# Patient Record
Sex: Female | Born: 1955
Health system: Southern US, Community
[De-identification: ages and names within clinical notes are randomized; demographics above are authoritative.]

## PROBLEM LIST (undated history)

## (undated) DIAGNOSIS — G8929 Other chronic pain: Secondary | ICD-10-CM

## (undated) DIAGNOSIS — R112 Nausea with vomiting, unspecified: Secondary | ICD-10-CM

## (undated) DIAGNOSIS — G609 Hereditary and idiopathic neuropathy, unspecified: Secondary | ICD-10-CM

## (undated) DIAGNOSIS — R1013 Epigastric pain: Secondary | ICD-10-CM

## (undated) DIAGNOSIS — G2581 Restless legs syndrome: Secondary | ICD-10-CM

## (undated) DIAGNOSIS — Z6826 Body mass index (BMI) 26.0-26.9, adult: Secondary | ICD-10-CM

## (undated) DIAGNOSIS — I10 Essential (primary) hypertension: Secondary | ICD-10-CM

## (undated) DIAGNOSIS — M549 Dorsalgia, unspecified: Secondary | ICD-10-CM

## (undated) DIAGNOSIS — R200 Anesthesia of skin: Secondary | ICD-10-CM

## (undated) DIAGNOSIS — K449 Diaphragmatic hernia without obstruction or gangrene: Secondary | ICD-10-CM

## (undated) DIAGNOSIS — R079 Chest pain, unspecified: Secondary | ICD-10-CM

## (undated) DIAGNOSIS — E7800 Pure hypercholesterolemia, unspecified: Secondary | ICD-10-CM

## (undated) DIAGNOSIS — E785 Hyperlipidemia, unspecified: Secondary | ICD-10-CM

## (undated) HISTORY — DX: Essential (primary) hypertension: I10

## (undated) HISTORY — PX: NECK SURGERY: SHX720

## (undated) HISTORY — DX: Hyperlipidemia, unspecified: E78.5

## (undated) HISTORY — DX: Chest pain, unspecified: R07.9

## (undated) HISTORY — DX: Anesthesia of skin: R20.0

## (undated) HISTORY — DX: Body mass index (BMI) 26.0-26.9, adult: Z68.26

## (undated) HISTORY — DX: Epigastric pain: R10.13

## (undated) HISTORY — DX: Hereditary and idiopathic neuropathy, unspecified: G60.9

## (undated) HISTORY — DX: Restless legs syndrome: G25.81

## (undated) HISTORY — PX: PARTIAL HYSTERECTOMY: SHX80

## (undated) HISTORY — PX: TOE SURGERY: SHX1073

---

## 2011-03-06 ENCOUNTER — Emergency Department (HOSPITAL_COMMUNITY)
Admission: EM | Admit: 2011-03-06 | Discharge: 2011-03-06 | Disposition: A | Discharge status: Home / Self Care | Payer: Self-pay | Attending: Emergency Medicine | Admitting: Emergency Medicine

## 2011-03-06 ENCOUNTER — Other Ambulatory Visit: Payer: Self-pay

## 2011-03-06 ENCOUNTER — Encounter: Payer: Self-pay | Admitting: Emergency Medicine

## 2011-03-06 DIAGNOSIS — K449 Diaphragmatic hernia without obstruction or gangrene: Secondary | ICD-10-CM | POA: Insufficient documentation

## 2011-03-06 DIAGNOSIS — E876 Hypokalemia: Secondary | ICD-10-CM | POA: Insufficient documentation

## 2011-03-06 DIAGNOSIS — R9431 Abnormal electrocardiogram [ECG] [EKG]: Secondary | ICD-10-CM | POA: Insufficient documentation

## 2011-03-06 DIAGNOSIS — E785 Hyperlipidemia, unspecified: Secondary | ICD-10-CM | POA: Insufficient documentation

## 2011-03-06 DIAGNOSIS — M549 Dorsalgia, unspecified: Secondary | ICD-10-CM | POA: Insufficient documentation

## 2011-03-06 DIAGNOSIS — R1013 Epigastric pain: Secondary | ICD-10-CM | POA: Insufficient documentation

## 2011-03-06 DIAGNOSIS — R112 Nausea with vomiting, unspecified: Secondary | ICD-10-CM | POA: Insufficient documentation

## 2011-03-06 DIAGNOSIS — E7800 Pure hypercholesterolemia, unspecified: Secondary | ICD-10-CM | POA: Insufficient documentation

## 2011-03-06 DIAGNOSIS — R1110 Vomiting, unspecified: Secondary | ICD-10-CM

## 2011-03-06 DIAGNOSIS — R197 Diarrhea, unspecified: Secondary | ICD-10-CM | POA: Insufficient documentation

## 2011-03-06 DIAGNOSIS — G8929 Other chronic pain: Secondary | ICD-10-CM | POA: Insufficient documentation

## 2011-03-06 HISTORY — DX: Diaphragmatic hernia without obstruction or gangrene: K44.9

## 2011-03-06 HISTORY — DX: Other chronic pain: G89.29

## 2011-03-06 HISTORY — DX: Dorsalgia, unspecified: M54.9

## 2011-03-06 HISTORY — DX: Pure hypercholesterolemia, unspecified: E78.00

## 2011-03-06 LAB — URINALYSIS, ROUTINE W REFLEX MICROSCOPIC
Bilirubin Urine: NEGATIVE
Glucose, UA: NEGATIVE mg/dL
Ketones, ur: 15 mg/dL — AB
Specific Gravity, Urine: 1.022 (ref 1.005–1.030)
pH: 8.5 — ABNORMAL HIGH (ref 5.0–8.0)

## 2011-03-06 LAB — COMPREHENSIVE METABOLIC PANEL
ALT: 7 U/L (ref 0–35)
AST: 13 U/L (ref 0–37)
CO2: 29 mEq/L (ref 19–32)
Calcium: 10.3 mg/dL (ref 8.4–10.5)
Chloride: 103 mEq/L (ref 96–112)
GFR calc Af Amer: >90 mL/min (ref >90)
GFR calc non Af Amer: >90 mL/min (ref >90)
Glucose, Bld: 107 mg/dL — ABNORMAL HIGH (ref 70–99)
Sodium: 143 mEq/L (ref 135–145)
Total Bilirubin: 0.3 mg/dL (ref 0.3–1.2)

## 2011-03-06 LAB — CBC
Hemoglobin: 11.8 g/dL — ABNORMAL LOW (ref 12.0–15.0)
MCH: 31.4 pg (ref 26.0–34.0)
MCV: 92.6 fL (ref 78.0–100.0)
Platelets: ADEQUATE 10*3/uL (ref 150–400)
RBC: 3.76 MIL/uL — ABNORMAL LOW (ref 3.87–5.11)
WBC: 10.9 10*3/uL — ABNORMAL HIGH (ref 4.0–10.5)

## 2011-03-06 LAB — DIFFERENTIAL
Basophils Relative: 0 % (ref 0–1)
Eosinophils Relative: 0 % (ref 0–5)
Lymphs Abs: 2.6 10*3/uL (ref 0.7–4.0)
Monocytes Absolute: 0.5 10*3/uL (ref 0.1–1.0)
Monocytes Relative: 5 % (ref 3–12)
Neutrophils Relative %: 71 % (ref 43–77)

## 2011-03-06 LAB — TROPONIN I: Troponin I: <0.3 ng/mL (ref <0.30)

## 2011-03-06 LAB — URINE MICROSCOPIC-ADD ON

## 2011-03-06 LAB — MAGNESIUM: Magnesium: 1.7 mg/dL (ref 1.5–2.5)

## 2011-03-06 MED ORDER — SODIUM CHLORIDE 0.9 % IV SOLN
Freq: Once | INTRAVENOUS | Status: AC
Start: 2011-03-06 — End: 2011-03-06
  Administered 2011-03-06: 20:00:00 via INTRAVENOUS

## 2011-03-06 MED ORDER — ONDANSETRON HCL 4 MG/2ML IJ SOLN
4.0000 mg | Freq: Once | INTRAMUSCULAR | Status: AC
  Administered 2011-03-06: 4 mg via INTRAVENOUS
  Filled 2011-03-06: qty 2

## 2011-03-06 MED ORDER — FENTANYL CITRATE 0.05 MG/ML IJ SOLN
100.0000 ug | Freq: Once | INTRAMUSCULAR | Status: AC
  Administered 2011-03-06: 100 ug via INTRAVENOUS
  Filled 2011-03-06: qty 2

## 2011-03-06 MED ORDER — ONDANSETRON HCL 4 MG/2ML IJ SOLN
4.0000 mg | Freq: Once | INTRAMUSCULAR | Status: DC
  Filled 2011-03-06: qty 2

## 2011-03-06 MED ORDER — PROMETHAZINE HCL 25 MG PO TABS: 25.0000 mg | ORAL_TABLET | Freq: Four times a day (QID) | ORAL | Status: AC | PRN

## 2011-03-06 MED ORDER — FAMOTIDINE 40 MG PO TABS: 20.0000 mg | ORAL_TABLET | Freq: Every day | ORAL | Status: DC

## 2011-03-06 MED ORDER — POTASSIUM CHLORIDE ER 10 MEQ PO TBCR: 10.0000 meq | EXTENDED_RELEASE_TABLET | Freq: Two times a day (BID) | ORAL | Status: DC

## 2011-03-06 MED ORDER — FAMOTIDINE IN NACL 20-0.9 MG/50ML-% IV SOLN
20.0000 mg | Freq: Once | INTRAVENOUS | Status: AC
  Administered 2011-03-06: 20 mg via INTRAVENOUS
  Filled 2011-03-06: qty 50

## 2011-03-06 MED ORDER — POTASSIUM CHLORIDE 20 MEQ/15ML (10%) PO LIQD
20.0000 meq | Freq: Once | ORAL | Status: AC
Start: 2011-03-06 — End: 2011-03-06
  Administered 2011-03-06: 20 meq via ORAL
  Filled 2011-03-06: qty 15

## 2011-03-06 MED ORDER — ONDANSETRON HCL 4 MG/2ML IJ SOLN
4.0000 mg | Freq: Once | INTRAMUSCULAR | Status: AC
  Administered 2011-03-06: 4 mg via INTRAVENOUS

## 2011-03-06 NOTE — ED Provider Notes (Signed)
History     CSN: [id number] Arrival date & time: 03/06/2011  5:49 PM   First MD Initiated Contact with Patient 03/06/11 1932      Chief Complaint  Patient presents with  . Emesis  HPI Patient presents emergency room with complaint of epigastric abdominal pain for the last day. Patient reports nausea and vomiting. Patient reports some diarrhea. Patient denies fevers. Patient denies any chest pain. Patient denies any shortness of breath. Patient denies any exertional dyspnea. Patient denies any coughing. Patient denies any pain with urinating. Patient denies any other complaints including rectal bleeding. Patient denies any vaginal bleeding or discharge. Past Medical History  Diagnosis Date  . Chronic back pain   . Hypercholesteremia   . Hiatal hernia     History reviewed. No pertinent past surgical history.  History reviewed. No pertinent family history.  History  Substance Use Topics  . Smoking status: Current Some Day Smoker  . Smokeless tobacco: Not on file  . Alcohol Use: No    OB History    Grav Para Term Preterm Abortions TAB SAB Ect Mult Living                  Review of Systems  Constitutional: Negative for fever, chills, diaphoresis and appetite change.  HENT: Negative for neck pain.   Eyes: Negative for photophobia and visual disturbance.  Respiratory: Negative for cough, chest tightness and shortness of breath.   Cardiovascular: Negative for chest pain.  Gastrointestinal: Positive for nausea, vomiting, abdominal pain and diarrhea. Negative for constipation and blood in stool.  Genitourinary: Negative for flank pain.  Musculoskeletal: Negative for back pain.  Skin: Negative for rash.  Neurological: Negative for weakness and numbness.  All other systems reviewed and are negative.    Allergies  Codeine  Home Medications   Current Outpatient Rx  Name Route Sig Dispense Refill  . IBUPROFEN 200 MG PO TABS Oral Take 400 mg by mouth daily as needed. For  pain     . IBUPROFEN 600 MG PO TABS Oral Take 600 mg by mouth daily as needed. For pain       BP 127/68  Pulse 61  Temp(Src) 98.9 F (37.2 C) (Oral)  Resp 18  SpO2 96%  Physical Exam  Nursing note and vitals reviewed. Constitutional: She is oriented to person, place, and time. She appears well-developed and well-nourished.  HENT:  Head: Normocephalic and atraumatic.  Eyes: EOM are normal. Pupils are equal, round, and reactive to light.  Neck: Normal range of motion. Neck supple.  Cardiovascular: Normal rate, regular rhythm and normal heart sounds.  Exam reveals no gallop and no friction rub.   No murmur heard. Pulmonary/Chest: No respiratory distress. She has no wheezes. She has no rales. She exhibits no tenderness.  Abdominal: Soft. Bowel sounds are normal. She exhibits no mass. There is tenderness. There is no rebound and no guarding.       Epigastric abdominal pain. Normal bowel sounds.   Musculoskeletal: Normal range of motion.  Neurological: She is alert and oriented to person, place, and time.  Skin: Skin is warm and dry. No rash noted. No erythema. No pallor.  Psychiatric: She has a normal mood and affect. Her behavior is normal. Judgment and thought content normal.    ED Course  Procedures (including critical care time) Patient seen and evaluated.  VSS reviewed. . Nursing notes reviewed. Discussed with attending physician, dr. Lynelle Doctor. Prolonged QT on ekg, advised magnesium check. Initial testing ordered. Will  monitor the patient closely. They agree with the treatment plan and diagnosis. Hypokalemia, corrected in the ed will send home with prescription. Advised recheck. Stated agreement and understanding. Advised of warning signs to return.   Patient seen and re-evaluated. Resting comfortably. VSS stable. NAD. Patient notified of testing results. Stated agreement and understanding. Patient stated understanding to treatment plan and diagnosis.    Results for orders placed  during the hospital encounter of 03/06/11  URINALYSIS, ROUTINE W REFLEX MICROSCOPIC      Component Value Range   Color, Urine YELLOW  YELLOW    Appearance HAZY (*) CLEAR    Specific Gravity, Urine 1.022  1.005 - 1.030    pH 8.5 (*) 5.0 - 8.0    Glucose, UA NEGATIVE  NEGATIVE (mg/dL)   Hgb urine dipstick SMALL (*) NEGATIVE    Bilirubin Urine NEGATIVE  NEGATIVE    Ketones, ur 15 (*) NEGATIVE (mg/dL)   Protein, ur 30 (*) NEGATIVE (mg/dL)   Urobilinogen, UA 1.0  0.0 - 1.0 (mg/dL)   Nitrite NEGATIVE  NEGATIVE    Leukocytes, UA NEGATIVE  NEGATIVE   LIPASE, BLOOD      Component Value Range   Lipase 22  11 - 59 (U/L)  CBC      Component Value Range   WBC 10.9 (*) 4.0 - 10.5 (K/uL)   RBC 3.76 (*) 3.87 - 5.11 (MIL/uL)   Hemoglobin 11.8 (*) 12.0 - 15.0 (g/dL)   HCT 16.1 (*) 09.6 - 46.0 (%)   MCV 92.6  78.0 - 100.0 (fL)   MCH 31.4  26.0 - 34.0 (pg)   MCHC 33.9  30.0 - 36.0 (g/dL)   RDW 04.5  40.9 - 81.1 (%)   Platelets    150 - 400 (K/uL)   Value: PLATELET CLUMPS NOTED ON SMEAR, COUNT APPEARS ADEQUATE  DIFFERENTIAL      Component Value Range   Neutrophils Relative 71  43 - 77 (%)   Lymphocytes Relative 24  12 - 46 (%)   Monocytes Relative 5  3 - 12 (%)   Eosinophils Relative 0  0 - 5 (%)   Basophils Relative 0  0 - 1 (%)   Neutro Abs 7.8 (*) 1.7 - 7.7 (K/uL)   Lymphs Abs 2.6  0.7 - 4.0 (K/uL)   Monocytes Absolute 0.5  0.1 - 1.0 (K/uL)   Eosinophils Absolute 0.0  0.0 - 0.7 (K/uL)   Basophils Absolute 0.0  0.0 - 0.1 (K/uL)   Smear Review MORPHOLOGY UNREMARKABLE    COMPREHENSIVE METABOLIC PANEL      Component Value Range   Sodium 143  135 - 145 (mEq/L)   Potassium 3.1 (*) 3.5 - 5.1 (mEq/L)   Chloride 103  96 - 112 (mEq/L)   CO2 29  19 - 32 (mEq/L)   Glucose, Bld 107 (*) 70 - 99 (mg/dL)   BUN 7  6 - 23 (mg/dL)   Creatinine, Ser 9.14  0.50 - 1.10 (mg/dL)   Calcium 78.2  8.4 - 10.5 (mg/dL)   Total Protein 8.3  6.0 - 8.3 (g/dL)   Albumin 4.0  3.5 - 5.2 (g/dL)   AST 13  0 - 37  (U/L)   ALT 7  0 - 35 (U/L)   Alkaline Phosphatase 95  39 - 117 (U/L)   Total Bilirubin 0.3  0.3 - 1.2 (mg/dL)   GFR calc non Af Amer >90  >90 (mL/min)   GFR calc Af Amer >90  >90 (mL/min)  GLUCOSE, CAPILLARY  Component Value Range   Glucose-Capillary 107 (*) 70 - 99 (mg/dL)  URINE MICROSCOPIC-ADD ON      Component Value Range   Squamous Epithelial / LPF FEW (*) RARE    WBC, UA 0-2  <3 (WBC/hpf)   RBC / HPF 7-10  <3 (RBC/hpf)   Bacteria, UA RARE  RARE    Urine-Other MUCOUS PRESENT    TROPONIN I      Component Value Range   Troponin I <0.30  <0.30 (ng/mL)  MAGNESIUM      Component Value Range   Magnesium 1.7  1.5 - 2.5 (mg/dL)    Date: 65/78/4696, 29:52  Rate: 61  Rhythm: normal sinus rhythm  QRS Axis: normal  Intervals: QT prolonged  ST/T Wave abnormalities: nonspecific ST/T changes, t-wave inversion in lead III  Conduction Disutrbances:none  Narrative Interpretation:   Old EKG Reviewed: none available     MDM  Hypokalemia Epigastric abdominal pain Diarrhea, nausea, vomitting Prolonged QT on EKG       Demetrius Charity, PA 03/06/11 2319

## 2011-03-06 NOTE — ED Notes (Signed)
CBG result of 107

## 2011-03-06 NOTE — ED Notes (Signed)
Pt. Placed on monitor per PA request.

## 2011-03-06 NOTE — ED Notes (Signed)
Sinus rhythm/sinus bradycardia noted on monitor; no ectopy noted

## 2011-03-06 NOTE — ED Notes (Signed)
Pt. Sleeping at this time; respirations even and unlabored

## 2011-03-06 NOTE — ED Notes (Signed)
Lab tech to bedside 

## 2011-03-06 NOTE — ED Provider Notes (Signed)
Medical screening examination/treatment/procedure(s) were performed by non-physician practitioner and as supervising physician I was immediately available for consultation/collaboration.   Paralee Pendergrass A. Patrica Duel, MD 03/06/11 2344

## 2011-03-06 NOTE — ED Notes (Signed)
Pt c/o N/V starting today with abd pain in epigastric area; pt sts some diarrhea

## 2011-03-07 LAB — URINE CULTURE: Culture  Setup Time: 201211082219

## 2014-04-28 DIAGNOSIS — C7A012 Malignant carcinoid tumor of the ileum: Secondary | ICD-10-CM

## 2014-04-28 HISTORY — DX: Malignant carcinoid tumor of the ileum: C7A.012

## 2014-12-11 ENCOUNTER — Encounter: Payer: Self-pay | Admitting: Neurology

## 2014-12-11 ENCOUNTER — Ambulatory Visit (INDEPENDENT_AMBULATORY_CARE_PROVIDER_SITE_OTHER): Payer: Medicaid Other | Admitting: Neurology

## 2014-12-11 VITALS — BP 137/96 | HR 94 | Ht 64.0 in | Wt 157.0 lb

## 2014-12-11 DIAGNOSIS — R200 Anesthesia of skin: Secondary | ICD-10-CM

## 2014-12-11 DIAGNOSIS — R269 Unspecified abnormalities of gait and mobility: Secondary | ICD-10-CM

## 2014-12-11 NOTE — Progress Notes (Signed)
PATIENT: Kristi Romero DOB: Apr 07, 1956  Chief Complaint  Patient presents with  . Numbness    Reports numbness in hips, legs, hands and mouth present for around 2-3 months.  Feels numbness is getting worse.  Also, concerned about her decreased appetite and weight loss.  Says she recently has a normal MRI at [facility] or [facility] (she cannot remember which location).     HISTORICAL  Kristi Romero is a 59 years old right-handed female, accompanied by her friend [name], seen in refer by  her primary care physician Dr.Haque, Imran for evaluation of numbness involving bilateral hips, legs, toes, hands, difficulty eating, losing weight  I reviewed and summarized her most recent office note in October 26 2014,  She had past medical history of hypertension, hyperlipidemia, acid reflux disease, reported smoke marijuana every day  Since July 2016, she noticed gradual onset numbness tingling involving her hands and toes, in the meantime, mild slowing unsteady gait, gradually worsened over the past few weeks, she also complains of constipation,  She denies significant visual trouble, no dysarthria, also complains that her mouth felt swallowing, mild difficulty swallowing, has been losing weight, numbness of her left face.  I have personally reviewed CAT scan of the brain in August the 7 2016, mild small vessel disease no acute lesions MRI of lumbar in November 08 2014 at [facility] imaging showed lumbar spondylosis, degenerative disc disease, mild impingement at L3-4, L4-5, L5-S1 level,   REVIEW OF SYSTEMS: Full 14 system review of systems performed and notable only for weight loss, chest pain, blurry vision, eye pain, shortness of breath, feeling hot, joint pain, cramps, headaches, numbness, dizziness, restless legs, not enough sleep, suicidal thoughts.  ALLERGIES: Allergies  Allergen Reactions  . Codeine Nausea Only  . Lisinopril Swelling    HOME MEDICATIONS: Current Outpatient Prescriptions    Medication Sig Dispense Refill  . ciprofloxacin (CIPRO) 500 MG tablet TK 1 T PO  BID  0  . cyclobenzaprine (FLEXERIL) 10 MG tablet 3 (three) times daily as needed.  0  . LYRICA 75 MG capsule   2  . metoprolol tartrate (LOPRESSOR) 25 MG tablet Take 25 mg by mouth 2 (two) times daily.    Marland Kitchen nystatin (MYCOSTATIN) 100000 UNIT/ML suspension RINSE AND SPIT WITH 5ML PO QID. ALSO SOAK DENTURE IN SUSPENSION QD  1  . ondansetron (ZOFRAN-ODT) 4 MG disintegrating tablet TK 1 T PO Q 6 HOURS  0  . pantoprazole (PROTONIX) 40 MG tablet TK 1 T PO QD  0     PAST MEDICAL HISTORY: Past Medical History  Diagnosis Date  . Chronic back pain   . Hypercholesteremia   . Hiatal hernia   . Numbness   . Hypertension     PAST SURGICAL HISTORY: Past Surgical History  Procedure Laterality Date  . Partial hysterectomy    . Toe surgery Bilateral     FAMILY HISTORY: Family History  Problem Relation Age of Onset  . Lung cancer Father   . Heart disease Mother   . Heart disease Father   . Hypertension Mother   . Hypertension Father     SOCIAL HISTORY:  Social History   Social History  . Marital Status: Single    Spouse Name: N/A  . Number of Children: 3  . Years of Education: 9   Occupational History  . Disabled    Social History Main Topics  . Smoking status: Never Smoker   . Smokeless tobacco: Not on file  . Alcohol Use:  No  . Drug Use: Yes    Special: Marijuana     Comment: Currently smokes about 7 marijuana "joints" per week.  Marland Kitchen Sexual Activity: Not on file   Other Topics Concern  . Not on file   Social History Narrative   Lives at home alone.   Right-handed.   1 cup caffeine per day.     PHYSICAL EXAM   Filed Vitals:   12/11/14 0837  BP: 137/96  Pulse: 94  Height: 5\' 4"  (1.626 m)  Weight: 157 lb (71.215 kg)    Not recorded      Body mass index is 26.94 kg/(m^2).  PHYSICAL EXAMNIATION:  Gen: NAD, conversant, well nourised, obese, well groomed                      Cardiovascular: Regular rate rhythm, no peripheral edema, warm, nontender. Eyes: Conjunctivae clear without exudates or hemorrhage Neck: Supple, no carotid bruise. Pulmonary: Clear to auscultation bilaterally   NEUROLOGICAL EXAM:  MENTAL STATUS: Speech:    Speech is normal; fluent and spontaneous with normal comprehension.  Cognition:     Orientation to time, place and person     Normal recent and remote memory     Normal Attention span and concentration     Normal Language, naming, repeating,spontaneous speech     Fund of knowledge   CRANIAL NERVES: CN II: Visual fields are full to confrontation. Fundoscopic exam is normal with sharp discs and no vascular changes. Pupils are round equal and briskly reactive to light. CN III, IV, VI: extraocular movement are normal. No ptosis. CN V: Facial sensation is intact to pinprick in all 3 divisions bilaterally. Corneal responses are intact.  CN VII: Face is symmetric with normal eye closure and smile. CN VIII: Hearing is normal to rubbing fingers CN IX, X: Palate elevates symmetrically. Phonation is normal. CN XI: Head turning and shoulder shrug are intact CN XII: Tongue is midline with normal movements and no atrophy.  MOTOR: There is no pronator drift of out-stretched arms. Muscle bulk and tone are normal. Muscle strength is normal.  REFLEXES: Reflexes are 2+ and symmetric at the biceps, triceps, 1 knees, and ankles. Plantar responses are flexor bilaterally.  SENSORY: Intact to light touch, pinprick, position sense, and vibration sense are intact in fingers and toes.  COORDINATION: Rapid alternating movements and fine finger movements are intact. There is no dysmetria on finger-to-nose and heel-knee-shin.    GAIT/STANCE: Need to push up to get up from seated position, wide-based, unsteady stiff gait   DIAGNOSTIC DATA (LABS, IMAGING, TESTING) - I reviewed patient records, labs, notes, testing and imaging myself where  available.   ASSESSMENT AND PLAN  [name] is a 59 y.o. female with subacute onset gait difficulty, paresthesia of hands and feet.  Differentiation diagnosis including cervical spondylitic myelopathy Proceed with MRI of cervical Return to clinic in next week    Marcial Pacas, M.D. Ph.D.  [facility] [address] [place], [place] [address] Ph: [phone] Fax: [phone]  CC: Dr.Haque, Wyatt Mage

## 2014-12-16 ENCOUNTER — Emergency Department (HOSPITAL_COMMUNITY)
Admission: EM | Admit: 2014-12-16 | Discharge: 2014-12-16 | Disposition: A | Discharge status: Home / Self Care | Payer: Medicaid Other | Attending: Emergency Medicine | Admitting: Emergency Medicine

## 2014-12-16 ENCOUNTER — Emergency Department (HOSPITAL_COMMUNITY): Payer: Medicaid Other

## 2014-12-16 ENCOUNTER — Encounter (HOSPITAL_COMMUNITY): Payer: Self-pay | Admitting: *Deleted

## 2014-12-16 DIAGNOSIS — R079 Chest pain, unspecified: Secondary | ICD-10-CM

## 2014-12-16 DIAGNOSIS — G8929 Other chronic pain: Secondary | ICD-10-CM | POA: Insufficient documentation

## 2014-12-16 DIAGNOSIS — Z8719 Personal history of other diseases of the digestive system: Secondary | ICD-10-CM | POA: Insufficient documentation

## 2014-12-16 DIAGNOSIS — I249 Acute ischemic heart disease, unspecified: Secondary | ICD-10-CM | POA: Insufficient documentation

## 2014-12-16 DIAGNOSIS — Z8639 Personal history of other endocrine, nutritional and metabolic disease: Secondary | ICD-10-CM | POA: Insufficient documentation

## 2014-12-16 DIAGNOSIS — I10 Essential (primary) hypertension: Secondary | ICD-10-CM | POA: Diagnosis not present

## 2014-12-16 DIAGNOSIS — R109 Unspecified abdominal pain: Secondary | ICD-10-CM | POA: Diagnosis present

## 2014-12-16 DIAGNOSIS — R1084 Generalized abdominal pain: Secondary | ICD-10-CM | POA: Insufficient documentation

## 2014-12-16 LAB — HEPATIC FUNCTION PANEL
ALK PHOS: 68 U/L (ref 38–126)
ALT: 14 U/L (ref 14–54)
AST: 23 U/L (ref 15–41)
Albumin: 3.8 g/dL (ref 3.5–5.0)
BILIRUBIN INDIRECT: 0.5 mg/dL (ref 0.3–0.9)
Bilirubin, Direct: 0.2 mg/dL (ref 0.1–0.5)
Total Bilirubin: 0.7 mg/dL (ref 0.3–1.2)
Total Protein: 8.2 g/dL — ABNORMAL HIGH (ref 6.5–8.1)

## 2014-12-16 LAB — BASIC METABOLIC PANEL
Anion gap: 14 (ref 5–15)
BUN: 6 mg/dL (ref 6–20)
CO2: 28 mmol/L (ref 22–32)
CREATININE: 0.72 mg/dL (ref 0.44–1.00)
Calcium: 9.9 mg/dL (ref 8.9–10.3)
Chloride: 92 mmol/L — ABNORMAL LOW (ref 101–111)
Glucose, Bld: 96 mg/dL (ref 65–99)
Potassium: 3 mmol/L — ABNORMAL LOW (ref 3.5–5.1)
SODIUM: 134 mmol/L — AB (ref 135–145)

## 2014-12-16 LAB — LIPASE, BLOOD: LIPASE: 19 U/L — AB (ref 22–51)

## 2014-12-16 LAB — CBC
HCT: 42.7 % (ref 36.0–46.0)
Hemoglobin: 15 g/dL (ref 12.0–15.0)
MCH: 33 pg (ref 26.0–34.0)
MCHC: 35.1 g/dL (ref 30.0–36.0)
MCV: 93.8 fL (ref 78.0–100.0)
PLATELETS: 236 10*3/uL (ref 150–400)
RBC: 4.55 MIL/uL (ref 3.87–5.11)
RDW: 13.2 % (ref 11.5–15.5)
WBC: 8.6 10*3/uL (ref 4.0–10.5)

## 2014-12-16 LAB — I-STAT TROPONIN, ED: TROPONIN I, POC: 0 ng/mL (ref 0.00–0.08)

## 2014-12-16 LAB — TSH: TSH: 1.715 u[IU]/mL (ref 0.350–4.500)

## 2014-12-16 LAB — D-DIMER, QUANTITATIVE (NOT AT ARMC): D DIMER QUANT: 0.29 ug{FEU}/mL (ref 0.00–0.48)

## 2014-12-16 LAB — TROPONIN I

## 2014-12-16 MED ORDER — POTASSIUM CHLORIDE CRYS ER 20 MEQ PO TBCR
40.0000 meq | EXTENDED_RELEASE_TABLET | Freq: Once | ORAL | Status: AC
  Administered 2014-12-16: 40 meq via ORAL
  Filled 2014-12-16: qty 2

## 2014-12-16 MED ORDER — SUCRALFATE 1 GM/10ML PO SUSP: 1.0000 g | Freq: Three times a day (TID) | ORAL | Status: DC

## 2014-12-16 MED ORDER — IOHEXOL 300 MG/ML  SOLN: 100.0000 mL | Freq: Once | INTRAMUSCULAR | Status: DC | PRN

## 2014-12-16 NOTE — ED Notes (Signed)
Pt came in today stating dyspnea on exertion and laying supine.  States when she lays flat her stomach tightens and it feels like a ring around her rib cage.

## 2014-12-16 NOTE — ED Provider Notes (Signed)
History   Chief Complaint  Patient presents with  . Shortness of Breath  . Abdominal Pain    HPI 59 year old female with past medical history as below who presents to ED for evaluation of chest tightness and abdominal pain. Patient states over the past 3 months she has lost 40 pounds unintentionally and has been having night sweats. She says over the last 2 weeks she has been having chest tightness worse with deep inspiration and lying flat which feels like a band wrapping around her lower chest. She describes generalized abdominal discomfort for past 2 weeks which is like a numbness.   Additionally, pt reports having sxs intermittently over the past 2-3 months. Denies history of similar symptoms in the past. Patient denies any diaphoresis, chest pain, cough, fever, chills, SOB, nausea, vomiting, leg pain or leg swelling, urinary sxs, history of DVT/PE. Patient states she has been taking all her medications as instructed however she notes she did not take her medications this morning as she was staying with a friend. Patient's symptoms began gradually and are constant. No other modifying factors reported. She rates her discomfort as moderate. No other complaints at this time.   Past medical/surgical history, social history, medications, allergies and FH have been reviewed with patient and/or in documentation. Furthermore, if pt family or friend(s) present, additional historical information was obtained from them.  Past Medical History  Diagnosis Date  . Chronic back pain   . Hypercholesteremia   . Hiatal hernia   . Numbness   . Hypertension    Past Surgical History  Procedure Laterality Date  . Partial hysterectomy    . Toe surgery Bilateral    Family History  Problem Relation Age of Onset  . Lung cancer Father   . Heart disease Mother   . Heart disease Father   . Hypertension Mother   . Hypertension Father    Social History  Substance Use Topics  . Smoking status: Never Smoker    . Smokeless tobacco: None  . Alcohol Use: No     Review of Systems Constitutional: - F/C, -fatigue.  HENT: - congestion, -rhinorrhea, -sore throat.   Eyes: - eye pain, -visual disturbance.  Respiratory: - cough, -SOB, -hemoptysis.   Cardiovascular: - CP, -palps. + chest tightness Gastrointestinal: - N/V/D, +abd pain  Genitourinary: - flank pain, -dysuria, -frequency.  Musculoskeletal: - myalgia/arthritis, -joint swelling, -gait abnormality, -back pain, -neck pain/stiffness, -leg pain/swelling.  Skin: - rash/lesion.  Neurological: - focal weakness, -lightheadedness, -dizziness, -numbness, -HA.  All other systems reviewed and are negative.   Physical Exam  Physical Exam  ED Triage Vitals  Enc Vitals Group     BP 12/16/14 1420 111/90 mmHg     Pulse Rate 12/16/14 1420 126     Resp 12/16/14 1420 30     Temp 12/16/14 1420 97.9 F (36.6 C)     Temp Source 12/16/14 1420 Oral     SpO2 12/16/14 1420 100 %     Weight 12/16/14 1420 152 lb (68.947 kg)     Height 12/16/14 1420 5\' 4"  (1.626 m)     Head Cir --      Peak Flow --      Pain Score 12/16/14 1422 8     Pain Loc --      Pain Edu? --      Excl. in [place]? --    Constitutional: Patient is a chronically ill appearing 59 yo fem and in no acute distress Head: Normocephalic and atraumatic.  Eyes: Extraocular motion intact, no scleral icterus Mouth: MMM, OP clear Neck: Supple without meningismus, mass, or overt JVD Respiratory: No respiratory distress. Normal WOB. No w/r/g. CV: RRR, no obvious murmurs.  Pulses +2 and symmetric. Euvolemic Abdomen: Soft, ND, no r/g. No mass. Mild TTP in epigastric region. No TTP over mcburneys.  MSK: Extremities are atraumatic without deformity, ROM intact Skin: Warm, dry, intact without rash Neuro: AAOx4, MAE 5/5 sym, SILT, no focal deficit noted   ED Course  Procedures   Labs Reviewed  BASIC METABOLIC PANEL - Abnormal; Notable for the following:    Sodium 134 (*)    Potassium 3.0 (*)     Chloride 92 (*)    All other components within normal limits  HEPATIC FUNCTION PANEL - Abnormal; Notable for the following:    Total Protein 8.2 (*)    All other components within normal limits  LIPASE, BLOOD - Abnormal; Notable for the following:    Lipase 19 (*)    All other components within normal limits  TROPONIN I  CBC  D-DIMER, QUANTITATIVE (NOT AT [facility])  TSH  I-STAT TROPOININ, ED  I-STAT TROPOININ, ED   I personally reviewed and interpreted all labs.  Dg Chest 2 View  12/16/2014   CLINICAL DATA:  59 year old female with a history of shortness of breath for 2 weeks  EXAM: CHEST - 2 VIEW  COMPARISON:  None.  FINDINGS: Cardiomediastinal silhouette projects within normal limits in size and contour. No confluent airspace disease, pneumothorax, or pleural effusion.  No displaced fracture.  Unremarkable appearance of the upper abdomen.  IMPRESSION: No radiographic evidence of acute cardiopulmonary disease.  Signed,  Dulcy Fanny. Earleen Newport, DO  [facility]  [facility]   Electronically Signed   By: Corrie Mckusick D.O.   On: 12/16/2014 15:40   Ct Abdomen Pelvis W Contrast  12/16/2014   CLINICAL DATA:  Abdominal pain in the upper abdomen for 2 weeks with nausea.  EXAM: CT ABDOMEN AND PELVIS WITH CONTRAST  TECHNIQUE: Multidetector CT imaging of the abdomen and pelvis was performed using the standard protocol following bolus administration of intravenous contrast.  CONTRAST:  100 mL Omnipaque 300  COMPARISON:  None.  FINDINGS: There is mild fatty infiltration of liver along the falciform ligament. The liver is otherwise normal. The patient is status post prior cholecystectomy. The spleen, pancreas, right adrenal gland and kidneys are normal. There is no hydronephrosis bilaterally. There is a 1.5 by 1.2 cm central low density lesion in the left adrenal gland, nonspecific on this postcontrast only exam. There is atherosclerosis of the abdominal aorta without  aneurysmal dilatation. There is no abdominal lymphadenopathy.  There is no small bowel obstruction or diverticulitis. The appendix is normal.  Fluid-filled bladder is normal. Pelvic phleboliths are identified. Patient is status post prior hysterectomy. The lung bases are clear. Degenerative joint changes of the spine are noted.  IMPRESSION: No acute abnormality identified in the abdomen and pelvis. There is no evidence of bowel obstruction. The appendix is normal.  1.5 x 1.2 cm enhancing lesion in the left adrenal gland, nonspecific on this postcontrast only exam.   Electronically Signed   By: Abelardo Diesel M.D.   On: 12/16/2014 18:33   I personally viewed above image(s) which were used in my medical decision making. Formal interpretations by Radiology.   EKG Interpretation  Date/Time:  Saturday December 16 2014 15:01:44 EDT Ventricular Rate:  115 PR Interval:  115 QRS Duration: 125 QT Interval:  418 QTC Calculation: 578 R Axis:   -66 Text Interpretation:  new Sinus tachycardia Ventricular premature complex Right atrial enlargement Nonspecific IVCD with LAD Inferior infarct, old Confirmed by Maryan Rued  MD, Loree Fee ([id number]) on 12/16/2014 3:24:08 PM       MDM: Littie Deeds is a 59 y.o. female with H&P as above who p/w CC: multiple complaints : chest tightness, epig abd pain, numbness, weight loss.  On arrival, patient is hemodynamically stable and in no apparent distress. Benign exam as above. Patient has vague chest tightness and epigastric pain. Given her age and risk factors patient will get screening labs for further evaluation, EKG, chest x-ray, CT abdomen and pelvis.  Patient has a low-risk hear score and plan for delta troponin.  Screening labs are unremarkable today and delta troponin was negative. Chest x-ray is unremarkable. CT abdomen pelvis is unremarkable. Patient's symptoms are likely associated with her known hiatal hernia. There is concern for PE in this otherwise low-risk patient.  She reports symptoms have resolved in ED. Advised to follow closely with her PCP and discuss possible GI referral as outpatient. Patient was given prescription for Carafate and instructed to take her Protonix daily. Patient is stable for discharge.  Old records reviewed (if available). Labs and imaging reviewed personally by myself and considered in medical decision making if ordered.  Clinical Impression: 1. Chest pain with low risk of acute coronary syndrome   2. Abdominal pain, generalized     Disposition: Discharge  Condition: Good  I have discussed the results, Dx and Tx plan with the pt(& family if present). He/she/they expressed understanding and agree(s) with the plan. Discharge instructions discussed at great length. Strict return precautions discussed and pt &/or family have verbalized understanding of the instructions. No further questions at time of discharge.    Discharge Medication List as of 12/16/2014  8:49 PM    START taking these medications   Details  sucralfate (CARAFATE) 1 GM/10ML suspension Take 10 mLs (1 g total) by mouth 4 (four) times daily -  with meals and at bedtime., Starting 12/16/2014, Until Discontinued, Print        Follow Up: Raelyn Number, MD [address] [place] [address] [phone]  Schedule an appointment as soon as possible for a visit in 3 days discuss GI evaluation  [facility] [address] [id number] [place] [phone]  If symptoms worsen   Pt seen in conjunction with Dr. Domenica Reamer, [facility] Resident - PGY-3      Kirstie Peri, MD 12/17/14 0109  Blanchie Dessert, MD 12/17/14 1941

## 2014-12-16 NOTE — ED Notes (Signed)
Pt transported to xray 

## 2014-12-16 NOTE — ED Notes (Signed)
Pt O2 sats dropping into 80's while sleeping, pt placed on 2L nasal cannula, sats up to 100%.

## 2014-12-16 NOTE — Discharge Instructions (Signed)

## 2014-12-16 NOTE — ED Notes (Signed)
Pt stable, ambulatory, states understanding of discharge instructions 

## 2014-12-16 NOTE — ED Notes (Signed)
Pt O2 sats at 82%, good wave form - pt asked to take deep breaths, O2 sats up to 86%, pt placed on 4L O2, sats up to 99%. Now on 2L remaining at 100%.

## 2014-12-16 NOTE — ED Notes (Addendum)
Pt complaining of numbness and tightening in lower extremities. Also states she is numb in her abdomen. Sensation intact. Pt anxious, HR @ 130, regular. New EKG captured and shown to MD [facility].

## 2014-12-18 MED ORDER — IOHEXOL 300 MG/ML  SOLN
100.0000 mL | Freq: Once | INTRAMUSCULAR | Status: AC | PRN
  Administered 2014-12-18: 100 mL via INTRAVENOUS

## 2014-12-21 ENCOUNTER — Encounter: Payer: Self-pay | Admitting: Neurology

## 2014-12-21 ENCOUNTER — Ambulatory Visit (INDEPENDENT_AMBULATORY_CARE_PROVIDER_SITE_OTHER): Payer: Medicaid Other | Admitting: Neurology

## 2014-12-21 VITALS — BP 140/98 | HR 99 | Ht 64.0 in | Wt 150.0 lb

## 2014-12-21 DIAGNOSIS — R269 Unspecified abnormalities of gait and mobility: Secondary | ICD-10-CM | POA: Diagnosis not present

## 2014-12-21 DIAGNOSIS — R200 Anesthesia of skin: Secondary | ICD-10-CM | POA: Diagnosis not present

## 2014-12-21 NOTE — Progress Notes (Signed)
Chief Complaint  Patient presents with  . Numbness    She is still experiencing numbness and gait difficulty.  She has a pending MRI cervical 12/22/14.  She was recently treated in the ED for shortness of breath and stomach pain.  She had a normal CT pelvis/abdomen and chest xray.      PATIENT: Kristi Romero DOB: 1955-06-15  Chief Complaint  Patient presents with  . Numbness    She is still experiencing numbness and gait difficulty.  She has a pending MRI cervical 12/22/14.  She was recently treated in the ED for shortness of breath and stomach pain.  She had a normal CT pelvis/abdomen and chest xray.     HISTORICAL  Kristi Romero is a 59 years old right-handed female, accompanied by her friend [name], seen in refer by  her primary care physician Dr.Haque, Imran for evaluation of numbness involving bilateral hips, legs, toes, hands, difficulty eating, losing weight  I reviewed and summarized her most recent office note in October 26 2014,  She had past medical history of hypertension, hyperlipidemia, acid reflux disease, reported smoke marijuana every day  Since July 2016, she noticed gradual onset numbness tingling involving her hands and toes, in the meantime, mild slowing unsteady gait, gradually worsened over the past few weeks, she also complains of constipation,  She denies significant visual trouble, no dysarthria, also complains mild difficulty swallowing, has been losing weight, numbness of her left face.  I have personally reviewed CAT scan of the brain in August the 7 2016, mild small vessel disease no acute lesions  MRI of lumbar in November 08 2014 at [facility] imaging showed lumbar spondylosis, degenerative disc disease, mild impingement at L3-4, L4-5, L5-S1 level,  UPDATE 12/21/2014: She is brought in by her friends at today's clinical visit, she complains of nausea, fatigue, poor appetite, constipation, reported 30 pound weight loss over the past 2 months  She presented to  emergency room December 16 2014 for nausea, lack of appetite, CT abdomen/pelvic showed no significant abnormality,  She continue have progressive gait difficulty, shortness of breath with few steps, she wears gloves because bilateral hands paresthesia, achy pain, also complains of bilateral feet numbness, numbness from waist down, urinary urgency  REVIEW OF SYSTEMS: Full 14 system review of systems performed and notable only for weight loss, loss of appetite, fatigue, excessive sweating, ringing ears, trouble swallowing, light sensitivity, shortness of breath, chest tightness, chest pain, cold intolerance, heat intolerance, abdominal pain, constipation, nausea, vomiting, restless leg, joint pain, joint swelling, back pain, achy muscles, muscle cramps, walking difficulty, neck pain Stiffness, dizziness, headaches, numbness, speech difficulty, depression anxiety suicidal salts.  ALLERGIES: Allergies  Allergen Reactions  . Codeine Nausea Only  . Lisinopril Swelling    HOME MEDICATIONS: Current Outpatient Prescriptions  Medication Sig Dispense Refill  . ciprofloxacin (CIPRO) 500 MG tablet TK 1 T PO  BID  0  . cyclobenzaprine (FLEXERIL) 10 MG tablet 3 (three) times daily as needed.  0  . LYRICA 75 MG capsule   2  . metoprolol tartrate (LOPRESSOR) 25 MG tablet Take 25 mg by mouth 2 (two) times daily.    Marland Kitchen nystatin (MYCOSTATIN) 100000 UNIT/ML suspension RINSE AND SPIT WITH 5ML PO QID. ALSO SOAK DENTURE IN SUSPENSION QD  1  . ondansetron (ZOFRAN-ODT) 4 MG disintegrating tablet TK 1 T PO Q 6 HOURS  0  . pantoprazole (PROTONIX) 40 MG tablet TK 1 T PO QD  0     PAST MEDICAL HISTORY: Past  Medical History  Diagnosis Date  . Chronic back pain   . Hypercholesteremia   . Hiatal hernia   . Numbness   . Hypertension     PAST SURGICAL HISTORY: Past Surgical History  Procedure Laterality Date  . Partial hysterectomy    . Toe surgery Bilateral     FAMILY HISTORY: Family History  Problem  Relation Age of Onset  . Lung cancer Father   . Heart disease Mother   . Heart disease Father   . Hypertension Mother   . Hypertension Father     SOCIAL HISTORY:  Social History   Social History  . Marital Status: Single    Spouse Name: N/A  . Number of Children: 3  . Years of Education: 9   Occupational History  . Disabled    Social History Main Topics  . Smoking status: Never Smoker   . Smokeless tobacco: Not on file  . Alcohol Use: No  . Drug Use: Yes    Special: Marijuana     Comment: Currently smokes about 7 marijuana "joints" per week.  Marland Kitchen Sexual Activity: Not on file   Other Topics Concern  . Not on file   Social History Narrative   Lives at home alone.   Right-handed.   1 cup caffeine per day.     PHYSICAL EXAM   Filed Vitals:   12/21/14 0840  BP: 140/98  Pulse: 99  Height: 5\' 4"  (1.626 m)  Weight: 150 lb (68.04 kg)    Not recorded      Body mass index is 25.73 kg/(m^2).  PHYSICAL EXAMNIATION:  Gen: NAD, conversant, well nourised, obese, well groomed                     Cardiovascular: Regular rate rhythm, no peripheral edema, warm, nontender. Eyes: Conjunctivae clear without exudates or hemorrhage Neck: Supple, no carotid bruise. Pulmonary: Clear to auscultation bilaterally   NEUROLOGICAL EXAM:  MENTAL STATUS: Speech:    Speech is normal; fluent and spontaneous with normal comprehension.  Cognition:     Orientation to time, place and person     Normal recent and remote memory     Normal Attention span and concentration     Normal Language, naming, repeating,spontaneous speech     Fund of knowledge   CRANIAL NERVES: CN II: Visual fields are full to confrontation. Fundoscopic exam is normal with sharp discs and no vascular changes. Pupils are round equal and briskly reactive to light. CN III, IV, VI: extraocular movement are normal. No ptosis. CN V: Facial sensation is intact to pinprick in all 3 divisions bilaterally. Corneal  responses are intact.  CN VII: Face is symmetric with normal eye closure and smile. CN VIII: Hearing is normal to rubbing fingers CN IX, X: Palate elevates symmetrically. Phonation is normal. CN XI: Head turning and shoulder shrug are intact CN XII: Tongue is midline with normal movements and no atrophy.  MOTOR: There is no pronator drift of out-stretched arms. Muscle bulk and tone are normal. Muscle strength is normal.  REFLEXES: Reflexes are 2+ and symmetric at the biceps, triceps, 1 knees, and ankles. Plantar responses are flexor bilaterally.  SENSORY: Length dependent decreased light touch, pinprick, vibratory sensation to midshin level   COORDINATION: There is no dysmetria on finger-to-nose and heel-knee-shin.  She has significant trunk ataxia  GAIT/STANCE: Need to push up to get up from seated position, wide-based, unsteady stiff gait   DIAGNOSTIC DATA (LABS, IMAGING, TESTING) - I  reviewed patient records, labs, notes, testing and imaging myself where available.   ASSESSMENT AND PLAN  Kristi Romero is a 59 y.o. female with subacute onset gait difficulty, paresthesia of hands and feet, numbness from waist down  Need to rule out cervical/thoracic cord lesions, differentiation diagnosis also includes cerebellum/brain stem pathology Proceed with MRI of neuroaxis Return to clinic in 2 weeks    Marcial Pacas, M.D. Ph.D.  [facility] [address], [place], [place] [address] Ph: [phone] Fax: [phone]  CC: Dr.Haque, Wyatt Mage

## 2014-12-21 NOTE — Addendum Note (Signed)
Addended by: Marcial Pacas on: 12/21/2014 10:43 AM   Modules accepted: Orders

## 2014-12-22 ENCOUNTER — Inpatient Hospital Stay: Admission: RE | Admit: 2014-12-22 | Payer: Self-pay | Source: Ambulatory Visit

## 2014-12-25 ENCOUNTER — Telehealth: Payer: Self-pay | Admitting: Neurology

## 2014-12-25 ENCOUNTER — Other Ambulatory Visit: Payer: Self-pay | Admitting: *Deleted

## 2014-12-25 DIAGNOSIS — F411 Generalized anxiety disorder: Secondary | ICD-10-CM

## 2014-12-25 MED ORDER — ALPRAZOLAM 0.5 MG PO TABS: ORAL_TABLET | ORAL | Status: DC

## 2014-12-25 NOTE — Telephone Encounter (Signed)
Patient is calling and states she needs a medication to keep her calm when she takes her MRI sent to [facility].  Thanks!

## 2014-12-25 NOTE — Telephone Encounter (Signed)
Spoke with patient and verified allergies.  She is aware that a driver is required if she takes alprazolam for her MRI.  She verbalized understanding.  Ok per Dr. Jaynee Eagles to provide alprazolam 0.5mg , #3 x 0, take 1-2 tablets 30 minutes prior to scan, may repeat with one additional tablet at time of scan if needed.  Rx faxed and confirmed to [facility] in [place].

## 2014-12-26 ENCOUNTER — Encounter (INDEPENDENT_AMBULATORY_CARE_PROVIDER_SITE_OTHER): Payer: Medicaid Other | Admitting: Diagnostic Neuroimaging

## 2014-12-26 ENCOUNTER — Ambulatory Visit (INDEPENDENT_AMBULATORY_CARE_PROVIDER_SITE_OTHER): Payer: Medicaid Other

## 2014-12-26 ENCOUNTER — Ambulatory Visit (INDEPENDENT_AMBULATORY_CARE_PROVIDER_SITE_OTHER): Payer: Self-pay

## 2014-12-26 DIAGNOSIS — Z0289 Encounter for other administrative examinations: Secondary | ICD-10-CM

## 2014-12-26 DIAGNOSIS — R269 Unspecified abnormalities of gait and mobility: Secondary | ICD-10-CM

## 2014-12-26 DIAGNOSIS — R200 Anesthesia of skin: Secondary | ICD-10-CM

## 2014-12-28 ENCOUNTER — Telehealth: Payer: Self-pay | Admitting: Neurology

## 2014-12-28 DIAGNOSIS — M4712 Other spondylosis with myelopathy, cervical region: Secondary | ICD-10-CM

## 2014-12-28 HISTORY — DX: Other spondylosis with myelopathy, cervical region: M47.12

## 2014-12-28 NOTE — Telephone Encounter (Signed)
I have called her MRI of cervical spine, there was evidence of moderate cervical spinal canal stenosis at C5-6, likely explaining a lot of her complaints, I will refer her to neurosurgical clinic  1. At C5-6: disc bulging and facet hypertrophy with moderate spinal stenosis and severe biforaminal stenosis; abnormal T2 hyperintense spinal cord signal posterior and laterally, likely related to myelomalacia 2. C3-4: disc bulging with moderate left foraminal stenosis  3. Left thyroid cysts noted. Correlate clinically.

## 2014-12-28 NOTE — Telephone Encounter (Signed)
Patient called requesting MRI results. Please call and advise. Patient can be reached at [phone] and [phone]

## 2015-01-11 ENCOUNTER — Ambulatory Visit: Payer: Medicaid Other | Admitting: Neurology

## 2015-01-15 ENCOUNTER — Ambulatory Visit: Payer: Medicaid Other | Attending: Neurological Surgery | Admitting: Physical Therapy

## 2015-01-15 DIAGNOSIS — R262 Difficulty in walking, not elsewhere classified: Secondary | ICD-10-CM | POA: Insufficient documentation

## 2015-01-15 DIAGNOSIS — M542 Cervicalgia: Secondary | ICD-10-CM | POA: Insufficient documentation

## 2015-01-15 DIAGNOSIS — R29898 Other symptoms and signs involving the musculoskeletal system: Secondary | ICD-10-CM | POA: Insufficient documentation

## 2015-01-16 ENCOUNTER — Ambulatory Visit: Payer: Medicaid Other

## 2015-01-16 DIAGNOSIS — R262 Difficulty in walking, not elsewhere classified: Secondary | ICD-10-CM

## 2015-01-16 DIAGNOSIS — R29898 Other symptoms and signs involving the musculoskeletal system: Secondary | ICD-10-CM

## 2015-01-16 DIAGNOSIS — M542 Cervicalgia: Secondary | ICD-10-CM

## 2015-01-16 NOTE — Therapy (Signed)
[place], [place], [address] Phone: [phone]   Fax:  [phone]  Physical Therapy Evaluation  Patient Details  Name: Kristi Romero MRN: [id number] Date of Birth: March 26, 1956 Referring Provider:  Tamala Fothergill, *  Encounter Date: 01/16/2015      PT End of Session - 01/16/15 1659    PT Start Time 0425   PT Stop Time 0450   PT Time Calculation (min) 25 min   Activity Tolerance Patient tolerated treatment well   Behavior During Therapy [facility] for tasks assessed/performed      Past Medical History  Diagnosis Date  . Chronic back pain   . Hypercholesteremia   . Hiatal hernia   . Numbness   . Hypertension     Past Surgical History  Procedure Laterality Date  . Partial hysterectomy    . Toe surgery Bilateral     There were no vitals filed for this visit.  Visit Diagnosis:  Weakness of both legs - Plan: PT plan of care cert/re-cert  Pain in neck - Plan: PT plan of care cert/re-cert  Difficulty walking - Plan: PT plan of care cert/re-cert      Subjective Assessment - 01/16/15 1654    Subjective Can't walk and hands and arms painful and numb. eports unsteady on feet and uses wheelchair. not sure why she is here. When asked if she had had PT before sh reported earlier this year she went to place in [place] and they told her Medicaid would not pay for therapy so she did not do PT at that time                               PT Education - 01/16/15 1659    Education provided Yes   Education Details We discussed Medicaid limits and she did not want to pay for PT. HEP   Person(s) Educated Patient   Methods Explanation;Handout   Comprehension Verbalized understanding                    Plan - 01/16/15 1700    Clinical Impression Statement She was not eva;uated and was issued basic exercises sitting and lying. She reports having a PT eval earlier this year.  Since this happened Medicaid will not pay for this eval or further treatments.    PT Next Visit Plan No follow up    Consulted and Agree with Plan of Care Patient         Problem List Patient Active Problem List   Diagnosis Date Noted  . 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 Chronic back pain   . Hypercholesteremia   . Hiatal hernia     Darrel Hoover PT 01/16/2015, 5:06 PM  [facility] [address] [place], [place], [address] Phone: [phone]   Fax:  [phone]

## 2015-01-16 NOTE — Patient Instructions (Signed)
After a basic range assessment of her neck which was WNL bit with end range pain, shoulder motion WFL and knee and ankle WNL I issued a basic active range program for hip , thigh and ankle strength/range and neck and shoulder range. 10 reps 2-3x/day. She was cautioned that she should stop if she began to have pain or parastehsias in arms or legs.

## 2016-04-18 DIAGNOSIS — G479 Sleep disorder, unspecified: Secondary | ICD-10-CM | POA: Insufficient documentation

## 2016-04-18 HISTORY — DX: Sleep disorder, unspecified: G47.9

## 2016-04-28 HISTORY — PX: COLONOSCOPY: SHX174

## 2016-05-07 DIAGNOSIS — W010XXA Fall on same level from slipping, tripping and stumbling without subsequent striking against object, initial encounter: Secondary | ICD-10-CM

## 2016-05-07 DIAGNOSIS — Y92009 Unspecified place in unspecified non-institutional (private) residence as the place of occurrence of the external cause: Secondary | ICD-10-CM

## 2016-05-07 DIAGNOSIS — R3915 Urgency of urination: Secondary | ICD-10-CM

## 2016-05-07 DIAGNOSIS — K439 Ventral hernia without obstruction or gangrene: Secondary | ICD-10-CM | POA: Insufficient documentation

## 2016-05-07 HISTORY — DX: Unspecified place in unspecified non-institutional (private) residence as the place of occurrence of the external cause: Y92.009

## 2016-05-07 HISTORY — DX: Urgency of urination: R39.15

## 2016-05-07 HISTORY — DX: Fall on same level from slipping, tripping and stumbling without subsequent striking against object, initial encounter: W01.0XXA

## 2016-05-07 HISTORY — DX: Ventral hernia without obstruction or gangrene: K43.9

## 2016-07-22 DIAGNOSIS — E041 Nontoxic single thyroid nodule: Secondary | ICD-10-CM | POA: Insufficient documentation

## 2016-07-22 DIAGNOSIS — E042 Nontoxic multinodular goiter: Secondary | ICD-10-CM | POA: Insufficient documentation

## 2016-07-22 HISTORY — DX: Nontoxic single thyroid nodule: E04.1

## 2016-07-22 HISTORY — DX: Nontoxic multinodular goiter: E04.2

## 2016-10-14 DIAGNOSIS — G4486 Cervicogenic headache: Secondary | ICD-10-CM

## 2016-10-14 DIAGNOSIS — R200 Anesthesia of skin: Secondary | ICD-10-CM

## 2016-10-14 DIAGNOSIS — E46 Unspecified protein-calorie malnutrition: Secondary | ICD-10-CM | POA: Insufficient documentation

## 2016-10-14 DIAGNOSIS — R42 Dizziness and giddiness: Secondary | ICD-10-CM | POA: Insufficient documentation

## 2016-10-14 DIAGNOSIS — R202 Paresthesia of skin: Secondary | ICD-10-CM | POA: Insufficient documentation

## 2016-10-14 HISTORY — DX: Unspecified protein-calorie malnutrition: E46

## 2016-10-14 HISTORY — DX: Cervicogenic headache: G44.86

## 2016-10-14 HISTORY — DX: Dizziness and giddiness: R42

## 2016-10-14 HISTORY — DX: Paresthesia of skin: R20.0

## 2017-01-13 DIAGNOSIS — E559 Vitamin D deficiency, unspecified: Secondary | ICD-10-CM | POA: Insufficient documentation

## 2017-01-13 DIAGNOSIS — R748 Abnormal levels of other serum enzymes: Secondary | ICD-10-CM

## 2017-01-13 HISTORY — DX: Abnormal levels of other serum enzymes: R74.8

## 2017-01-13 HISTORY — DX: Vitamin D deficiency, unspecified: E55.9

## 2017-10-28 DIAGNOSIS — G629 Polyneuropathy, unspecified: Secondary | ICD-10-CM

## 2017-10-28 HISTORY — DX: Polyneuropathy, unspecified: G62.9

## 2018-02-26 HISTORY — PX: ESOPHAGOGASTRODUODENOSCOPY: SHX1529

## 2018-03-11 DIAGNOSIS — F418 Other specified anxiety disorders: Secondary | ICD-10-CM

## 2018-03-11 DIAGNOSIS — I10 Essential (primary) hypertension: Secondary | ICD-10-CM | POA: Diagnosis not present

## 2018-03-11 DIAGNOSIS — E279 Disorder of adrenal gland, unspecified: Secondary | ICD-10-CM

## 2018-03-11 DIAGNOSIS — R112 Nausea with vomiting, unspecified: Secondary | ICD-10-CM

## 2018-03-11 DIAGNOSIS — E785 Hyperlipidemia, unspecified: Secondary | ICD-10-CM | POA: Diagnosis not present

## 2018-03-11 DIAGNOSIS — E876 Hypokalemia: Secondary | ICD-10-CM

## 2018-03-12 DIAGNOSIS — R61 Generalized hyperhidrosis: Secondary | ICD-10-CM

## 2018-03-12 DIAGNOSIS — R112 Nausea with vomiting, unspecified: Secondary | ICD-10-CM | POA: Diagnosis not present

## 2018-03-12 DIAGNOSIS — E876 Hypokalemia: Secondary | ICD-10-CM | POA: Diagnosis not present

## 2018-03-12 DIAGNOSIS — E279 Disorder of adrenal gland, unspecified: Secondary | ICD-10-CM | POA: Diagnosis not present

## 2018-03-13 DIAGNOSIS — R61 Generalized hyperhidrosis: Secondary | ICD-10-CM | POA: Diagnosis not present

## 2018-03-13 DIAGNOSIS — E876 Hypokalemia: Secondary | ICD-10-CM | POA: Diagnosis not present

## 2018-03-13 DIAGNOSIS — E279 Disorder of adrenal gland, unspecified: Secondary | ICD-10-CM | POA: Diagnosis not present

## 2018-03-13 DIAGNOSIS — R112 Nausea with vomiting, unspecified: Secondary | ICD-10-CM | POA: Diagnosis not present

## 2018-03-14 DIAGNOSIS — R112 Nausea with vomiting, unspecified: Secondary | ICD-10-CM | POA: Diagnosis not present

## 2018-03-14 DIAGNOSIS — E876 Hypokalemia: Secondary | ICD-10-CM | POA: Diagnosis not present

## 2018-03-14 DIAGNOSIS — E279 Disorder of adrenal gland, unspecified: Secondary | ICD-10-CM | POA: Diagnosis not present

## 2018-03-14 DIAGNOSIS — R61 Generalized hyperhidrosis: Secondary | ICD-10-CM | POA: Diagnosis not present

## 2018-03-15 ENCOUNTER — Encounter (HOSPITAL_COMMUNITY): Payer: Self-pay | Admitting: *Deleted

## 2018-03-15 ENCOUNTER — Inpatient Hospital Stay (HOSPITAL_COMMUNITY)
Admission: AD | Admit: 2018-03-15 | Discharge: 2018-03-19 | DRG: 918 | Disposition: A | Discharge status: Home / Self Care | Payer: Medicaid Other | Source: Other Acute Inpatient Hospital | Attending: Family Medicine | Admitting: Family Medicine

## 2018-03-15 DIAGNOSIS — R1013 Epigastric pain: Secondary | ICD-10-CM

## 2018-03-15 DIAGNOSIS — Z888 Allergy status to other drugs, medicaments and biological substances status: Secondary | ICD-10-CM

## 2018-03-15 DIAGNOSIS — E278 Other specified disorders of adrenal gland: Secondary | ICD-10-CM | POA: Diagnosis present

## 2018-03-15 DIAGNOSIS — Z90711 Acquired absence of uterus with remaining cervical stump: Secondary | ICD-10-CM | POA: Diagnosis not present

## 2018-03-15 DIAGNOSIS — Z9049 Acquired absence of other specified parts of digestive tract: Secondary | ICD-10-CM | POA: Diagnosis not present

## 2018-03-15 DIAGNOSIS — E785 Hyperlipidemia, unspecified: Secondary | ICD-10-CM

## 2018-03-15 DIAGNOSIS — T407X1A Poisoning by cannabis (derivatives), accidental (unintentional), initial encounter: Secondary | ICD-10-CM | POA: Diagnosis not present

## 2018-03-15 DIAGNOSIS — D3500 Benign neoplasm of unspecified adrenal gland: Secondary | ICD-10-CM

## 2018-03-15 DIAGNOSIS — F329 Major depressive disorder, single episode, unspecified: Secondary | ICD-10-CM | POA: Diagnosis not present

## 2018-03-15 DIAGNOSIS — K269 Duodenal ulcer, unspecified as acute or chronic, without hemorrhage or perforation: Secondary | ICD-10-CM | POA: Diagnosis not present

## 2018-03-15 DIAGNOSIS — K2980 Duodenitis without bleeding: Secondary | ICD-10-CM | POA: Diagnosis present

## 2018-03-15 DIAGNOSIS — Z79899 Other long term (current) drug therapy: Secondary | ICD-10-CM | POA: Diagnosis not present

## 2018-03-15 DIAGNOSIS — E7800 Pure hypercholesterolemia, unspecified: Secondary | ICD-10-CM | POA: Diagnosis present

## 2018-03-15 DIAGNOSIS — F419 Anxiety disorder, unspecified: Secondary | ICD-10-CM

## 2018-03-15 DIAGNOSIS — E669 Obesity, unspecified: Secondary | ICD-10-CM | POA: Diagnosis present

## 2018-03-15 DIAGNOSIS — Z801 Family history of malignant neoplasm of trachea, bronchus and lung: Secondary | ICD-10-CM

## 2018-03-15 DIAGNOSIS — G8929 Other chronic pain: Secondary | ICD-10-CM | POA: Diagnosis not present

## 2018-03-15 DIAGNOSIS — K2950 Unspecified chronic gastritis without bleeding: Secondary | ICD-10-CM | POA: Diagnosis not present

## 2018-03-15 DIAGNOSIS — Z86010 Personal history of colonic polyps: Secondary | ICD-10-CM

## 2018-03-15 DIAGNOSIS — E269 Hyperaldosteronism, unspecified: Secondary | ICD-10-CM | POA: Diagnosis present

## 2018-03-15 DIAGNOSIS — R112 Nausea with vomiting, unspecified: Secondary | ICD-10-CM | POA: Diagnosis present

## 2018-03-15 DIAGNOSIS — R000 Tachycardia, unspecified: Secondary | ICD-10-CM | POA: Diagnosis not present

## 2018-03-15 DIAGNOSIS — R1110 Vomiting, unspecified: Secondary | ICD-10-CM | POA: Diagnosis present

## 2018-03-15 DIAGNOSIS — Z8249 Family history of ischemic heart disease and other diseases of the circulatory system: Secondary | ICD-10-CM

## 2018-03-15 DIAGNOSIS — I700 Atherosclerosis of aorta: Secondary | ICD-10-CM | POA: Diagnosis present

## 2018-03-15 DIAGNOSIS — F32A Depression, unspecified: Secondary | ICD-10-CM

## 2018-03-15 DIAGNOSIS — Z885 Allergy status to narcotic agent status: Secondary | ICD-10-CM

## 2018-03-15 DIAGNOSIS — X58XXXA Exposure to other specified factors, initial encounter: Secondary | ICD-10-CM | POA: Diagnosis not present

## 2018-03-15 DIAGNOSIS — I10 Essential (primary) hypertension: Secondary | ICD-10-CM | POA: Diagnosis not present

## 2018-03-15 DIAGNOSIS — E876 Hypokalemia: Secondary | ICD-10-CM | POA: Diagnosis present

## 2018-03-15 DIAGNOSIS — K449 Diaphragmatic hernia without obstruction or gangrene: Secondary | ICD-10-CM | POA: Diagnosis present

## 2018-03-15 DIAGNOSIS — K319 Disease of stomach and duodenum, unspecified: Secondary | ICD-10-CM | POA: Diagnosis not present

## 2018-03-15 DIAGNOSIS — Z7151 Drug abuse counseling and surveillance of drug abuser: Secondary | ICD-10-CM

## 2018-03-15 DIAGNOSIS — R61 Generalized hyperhidrosis: Secondary | ICD-10-CM | POA: Diagnosis not present

## 2018-03-15 DIAGNOSIS — E279 Disorder of adrenal gland, unspecified: Secondary | ICD-10-CM | POA: Diagnosis not present

## 2018-03-15 DIAGNOSIS — Z6831 Body mass index (BMI) 31.0-31.9, adult: Secondary | ICD-10-CM

## 2018-03-15 DIAGNOSIS — E162 Hypoglycemia, unspecified: Secondary | ICD-10-CM

## 2018-03-15 HISTORY — DX: Nausea with vomiting, unspecified: R11.2

## 2018-03-15 NOTE — Consult Note (Addendum)
[facility] Consult: 8:20 AM 03/16/2018  LOS: 1 day    Referring Provider: Dr Cyndia Skeeters  Primary Care Physician:  Dr Jerilynn Som at [facility]. Gastroenterologist:  Dr. Lyndel Safe (not since 2018)    Reason for Consultation:  Nausea, vomiting   HPI: Kristi Romero is a 62 y.o. female.  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  Partial hysterectomy and bladder sling ~ age 62 by which time she had 5 pregnancies with 3 live births and 2 miscarriages.  TAH 1998, path with fibroid uterus.   S/p cholecystectomy (wher/when).  Cervical disc dz and stenosis.  Pyelonephritis.  Cervicogenic headaches.  Chronic back pain.  Cognitive impairment.   Anemia.  Thyroid nodules, benign.   Colonoscopy 01/2015 (pre surgery) Lipomatous ICV with stenosis, not able to traverse.  Two 2 to 3 mm polyps (tubular adenomas).  EGD 01/2015: thick gastric folds, Path: chronic active gastritis negative for H Pylori.   07/2015 thru 04/2016 PEG for FTT, weight loss, ongoing chronic abd pain.   Wt 102# pre-PEG.     Work ups for N/V dating to at least 2011. Has been advised by multiple GI and other MDs to stop smoking marijuana due to concern for cannabinoid associated hyperemesis.   CT scans in 10/2014, 07/2015, 08/2016 often showing constipation.   08/2016 CT showed patent Celiac, SMA, IMA, portal and splenic veins.   Octreotide scan 02/2015: no evidnce of recurrent neuroendocrine tumor.   Head CT 08/2016: small vsl dz.  Care scattered and includes visits to EDs, admissions, providers at [facility], [facility], [place].  Previously [place]. Seen by Dr Aviva Signs at [facility].  He wondered about ventral hernias contributing to her chronic N/V and abd pain.       +++++++++++++++++++++++++++++++++++++++++++++++++++++++++++++++++++++  Patient presented to [facility] 03/11/2018 with 3 weeks of persistent nausea, nonbloody emesis and epigastric pain.  Prior to this she was not having any problems in fact she says that she had been relatively free of GI symptoms and eating well since the PEG was removed in 2018.  Also had not needed Linzess other than on rare occasions.  She reported her weight is being around 180 pounds up until she started having the nausea and vomiting. Patient is a poor historian she is not aware of the medicine she takes but the H&P from [place] said that she had not used many of her regular medications for a couple of weeks because of the nausea and vomiting.  These included Protonix 40 mg daily, Metoclopramide (dose unknown) TID.  She uses 400 mg ibuprofen about once a week.  She smokes marijuana daily/chronically, 4 "blunts" (small cigar sized) per day.  No alcoholic beverages.  Urinalysis unremarkable.  CBC normal.  + hypokalemia.  Despite the dozens of pages sent from [facility], I cannot find LFTs or lipase level though it is mentioned that the CMP was "unremarkable".  LFTs were performed early this morning and were all unremarkable. Abdominal radiographs were negative for obstruction no obvious constipation. 03/11/2018 CT abdomen, pelvis showed small hiatal  hernia, aortic atherosclerosis, indeterminant left adrenal nodule with slow growth compared with 2014 recommend outpatient MRI fat-containing bilateral inguinal hernias.  Status post cholecystectomy and hysterectomy. CTA of chest negative for PE.  Though small PE may not be detected due to artifact.  Main pulmonary artery mildly prominent consistent with pulmonary arterial hypertension. Degenerative changes of the lumbar sacral and thoracic spine seen on CT scans. Gastric emptying study attempted but patient vomited during the test and was not able to complete it. IV  Phenergan was somewhat effective in temporarily controlling her symptoms but overall they persisted along with the epigastric pain.  EGD/GI services not available at [facility] so decision was made to transfer her to [facility].  Pending tests at the time of transfer to [facility] included aldosterone and renin levels.  24-hour urine for catecholamines, metanephrines, plasma fractionated metanephrines were not collected.  Fm Hx of lung cancer, COPD, MI, "cancer".  Colon cancer in mat GF.   Past Medical History:  Diagnosis Date  . Chronic back pain   . Hiatal hernia   . Hypercholesteremia   . Hypertension   . Numbness     Past Surgical History:  Procedure Laterality Date  . PARTIAL HYSTERECTOMY    . TOE SURGERY Bilateral     Prior to Admission medications   Atorvastatin 40 mg daily Baclofen 10 mg p.o. 3 times daily Duloxetine 60 mg daily Metoclopramide, dose not noted 1 tablet p.o. 3 times daily Pantoprazole 40 mg daily  Scheduled Meds:  Infusions:  PRN Meds:    Allergies as of 03/15/2018 - Review Complete 03/15/2018  Allergen Reaction Noted  . Codeine Nausea Only 03/06/2011  . Lisinopril Swelling 12/11/2014    Family History  Problem Relation Age of Onset  . Lung cancer Father   . Heart disease Mother   . Heart disease Father   . Hypertension Mother   . Hypertension Father     Social History   Socioeconomic History  . Marital status: Divorced    Spouse name: Not on file  . Number of children: 3  . Years of education: 9  . Highest education level: Not on file  Occupational History  . Occupation: Disabled  Social Needs  . Financial resource strain: Not on file  . Food insecurity:    Worry: Not on file    Inability: Not on file  . Transportation needs:    Medical: Not on file    Non-medical: Not on file  Tobacco Use  . Smoking status: Never Smoker  Substance and Sexual Activity  . Alcohol use: No    Alcohol/week: 0.0 standard  drinks  . Drug use: Yes    Types: Marijuana    Comment: Currently smokes about 7 marijuana "joints" per week.  Marland Kitchen Sexual activity: Not on file  Lifestyle  . Physical activity:    Days per week: Not on file    Minutes per session: Not on file  . Stress: Not on file  Relationships  . Social connections:    Talks on phone: Not on file    Gets together: Not on file    Attends religious service: Not on file    Active member of club or organization: Not on file    Attends meetings of clubs or organizations: Not on file    Relationship status: Not on file  . Intimate partner violence:    Fear of current or ex partner: Not on file    Emotionally abused: Not on  file    Physically abused: Not on file    Forced sexual activity: Not on file  Other Topics Concern  . Not on file  Social History Narrative   Lives at home alone.   Right-handed.   1 cup caffeine per day.    REVIEW OF SYSTEMS: Constitutional: Weakness. ENT:  No nose bleeds Pulm: Denies shortness of breath or cough CV:  No palpitations, no LE edema.  No chest pain GU:  No hematuria, no frequency GI:  Per HPI Heme: Denies unusual or excessive bleeding or bruising. Transfusions: No recall of previous transfusions. Neuro:'s of sensation in her feet.  Poor balance. Derm:  No itching, no rash or sores.  Endocrine:  No sweats or chills.  No polyuria or dysuria Immunization: Did not inquire as to her vaccinations. Travel:  None beyond local counties in last few months.    PHYSICAL EXAM: Vital signs in last 24 hours: Vitals:   03/15/18 2335  BP: (!) 144/91  Pulse: 72  Temp: 97.6 F (36.4 C)  SpO2: 92%   Wt Readings from Last 3 Encounters:  12/21/14 68 kg  12/16/14 68.9 kg  12/11/14 71.2 kg   78.8 KG 03/14/18  General: Somewhat ill, somnolent African-American female.  Resting comfortably. Head: Facial asymmetry or swelling.  No signs of head trauma. Eyes: Scleral icterus.  No conjunctival pallor. Ears: Not hard  of hearing Nose: No discharge Mouth: Oral mucosa pink, moist, clear.  Tongue midline. Neck: No JVD, no thyromegaly, no masses Lungs: Labored breathing or cough.  Lungs clear bilaterally Heart: RRR.  No MRG.  S1, S2 present Abdomen: Soft.  Tender in the upper abdomen.  No guarding or rebound.  Bowel sounds normal but hypoactive.  No distention.  No HSM, masses do not appreciate hernias. Rectal: Owens Shark, soft stool in the rectum is FOBT negative.  No masses. Musc/Skeltl: No joint swelling, redness or gross deformity. Extremities: No CCE. Neurologic: Drowsy but arousable.  Moves all 4 limbs, no tremor, no gross weakness or deficits. Skin: No rashes, no sores. Tattoos: At the sacral region. Nodes: No cervical adenopathy. Psych: Does not, cooperative, affect blunted.  Intake/Output from previous day: 11/18 0701 - 11/19 0700 In: 225.7 [I.V.:225.7] Out: -  Intake/Output this shift: No intake/output data recorded.  LAB RESULTS: Recent Labs    03/16/18 0256  WBC 9.4  HGB 12.0  HCT 36.3  PLT 254   BMET Lab Results  Component Value Date   NA 137 03/16/2018   NA 134 (L) 12/16/2014   NA 143 03/06/2011   K 3.2 (L) 03/16/2018   K 3.0 (L) 12/16/2014   K 3.1 (L) 03/06/2011   CL 100 03/16/2018   CL 92 (L) 12/16/2014   CL 103 03/06/2011   CO2 28 03/16/2018   CO2 28 12/16/2014   CO2 29 03/06/2011   GLUCOSE 101 (H) 03/16/2018   GLUCOSE 96 12/16/2014   GLUCOSE 107 (H) 03/06/2011   BUN <5 (L) 03/16/2018   BUN 6 12/16/2014   BUN 7 03/06/2011   CREATININE 0.66 03/16/2018   CREATININE 0.72 12/16/2014   CREATININE 0.54 03/06/2011   CALCIUM 9.0 03/16/2018   CALCIUM 9.9 12/16/2014   CALCIUM 10.3 03/06/2011   LFT Recent Labs    03/16/18 0256  PROT 6.6  ALBUMIN 3.3*  AST 20  ALT 16  ALKPHOS 76  BILITOT 1.0   Hepatitis Panel No results for input(s): HEPBSAG, HCVAB, HEPAIGM, HEPBIGM in the last 72 hours. Lipase     Component  Value Date/Time   LIPASE 19 (L) 12/16/2014 1622       RADIOLOGY STUDIES: No results found.    IMPRESSION:   *   Nausea, vomiting, epigastric pain.  Rule out ulcer disease. R/o  Marijuana associated hyperemesis.   Hx gastritis on EGD 2016.  *   Hypokalemia  *    Left adrenal nodule, work-up in progress.  MRI was suggested in order to further evaluate this.  tests not yet resulted from [place] or aldosterone and renin levels.  Consideration had been given to 24-hour urine collection for catecholamines and metanephrines.  *   Hx ileal carcinoid.  Resected 2016.    *   Tubular adenomatous colon polyps 2016.  Has not had repeat colonoscopy.  *   S/p PEG x 9 months 2017 - 2018.  Performed for chronic nausea and vomiting similar to current symptoms.    PLAN:     *   EGD planned.  Hopefully this can be performed around 215 today. Ordered 20 meq Potassium IV to correct hypokalemia.    *    Would suggest the adrenal mass be worked up and all urinary and blood tests be obtained to rule out pheochromocytoma   Azucena Freed  03/16/2018, 8:20 AM Phone [phone]     Attending Physician's note   I have taken a history, examined the patient and reviewed the chart. I agree with the Advanced Practitioner's note, impression and recommendations. Recurrent nausea, vomiting, epigastric pain. R/O marijuana related hyperemesis. R/O ulcer, gastritis, esophagitis. EGD today. Discontinue THC use. Further evaluation of adrenal lesion per primary service.    Lucio Edward, MD FACG [phone] office

## 2018-03-15 NOTE — H&P (View-Only) (Signed)
[facility] Consult: 8:20 AM 03/16/2018  LOS: 1 day    Referring Provider: Dr Cyndia Skeeters  Primary Care Physician:  Dr Jerilynn Som at [facility]. Gastroenterologist:  Dr. Lyndel Safe (not since 2018)    Reason for Consultation:  Nausea, vomiting   HPI: Kristi Romero is a 62 y.o. female.  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  Partial hysterectomy and bladder sling ~ age 36 by which time she had 5 pregnancies with 3 live births and 2 miscarriages.  TAH 1998, path with fibroid uterus.   S/p cholecystectomy (wher/when).  Cervical disc dz and stenosis.  Pyelonephritis.  Cervicogenic headaches.  Chronic back pain.  Cognitive impairment.   Anemia.  Thyroid nodules, benign.   Colonoscopy 01/2015 (pre surgery) Lipomatous ICV with stenosis, not able to traverse.  Two 2 to 3 mm polyps (tubular adenomas).  EGD 01/2015: thick gastric folds, Path: chronic active gastritis negative for H Pylori.   07/2015 thru 04/2016 PEG for FTT, weight loss, ongoing chronic abd pain.   Wt 102# pre-PEG.     Work ups for N/V dating to at least 2011. Has been advised by multiple GI and other MDs to stop smoking marijuana due to concern for cannabinoid associated hyperemesis.   CT scans in 10/2014, 07/2015, 08/2016 often showing constipation.   08/2016 CT showed patent Celiac, SMA, IMA, portal and splenic veins.   Octreotide scan 02/2015: no evidnce of recurrent neuroendocrine tumor.   Head CT 08/2016: small vsl dz.  Care scattered and includes visits to EDs, admissions, providers at [facility], [facility], [place].  Previously [place]. Seen by Dr Aviva Signs at [facility].  He wondered about ventral hernias contributing to her chronic N/V and abd pain.       +++++++++++++++++++++++++++++++++++++++++++++++++++++++++++++++++++++  Patient presented to [facility] 03/11/2018 with 3 weeks of persistent nausea, nonbloody emesis and epigastric pain.  Prior to this she was not having any problems in fact she says that she had been relatively free of GI symptoms and eating well since the PEG was removed in 2018.  Also had not needed Linzess other than on rare occasions.  She reported her weight is being around 180 pounds up until she started having the nausea and vomiting. Patient is a poor historian she is not aware of the medicine she takes but the H&P from [place] said that she had not used many of her regular medications for a couple of weeks because of the nausea and vomiting.  These included Protonix 40 mg daily, Metoclopramide (dose unknown) TID.  She uses 400 mg ibuprofen about once a week.  She smokes marijuana daily/chronically, 4 "blunts" (small cigar sized) per day.  No alcoholic beverages.  Urinalysis unremarkable.  CBC normal.  + hypokalemia.  Despite the dozens of pages sent from [facility], I cannot find LFTs or lipase level though it is mentioned that the CMP was "unremarkable".  LFTs were performed early this morning and were all unremarkable. Abdominal radiographs were negative for obstruction no obvious constipation. 03/11/2018 CT abdomen, pelvis showed small hiatal  hernia, aortic atherosclerosis, indeterminant left adrenal nodule with slow growth compared with 2014 recommend outpatient MRI fat-containing bilateral inguinal hernias.  Status post cholecystectomy and hysterectomy. CTA of chest negative for PE.  Though small PE may not be detected due to artifact.  Main pulmonary artery mildly prominent consistent with pulmonary arterial hypertension. Degenerative changes of the lumbar sacral and thoracic spine seen on CT scans. Gastric emptying study attempted but patient vomited during the test and was not able to complete it. IV  Phenergan was somewhat effective in temporarily controlling her symptoms but overall they persisted along with the epigastric pain.  EGD/GI services not available at [facility] so decision was made to transfer her to [facility].  Pending tests at the time of transfer to [facility] included aldosterone and renin levels.  24-hour urine for catecholamines, metanephrines, plasma fractionated metanephrines were not collected.  Fm Hx of lung cancer, COPD, MI, "cancer".  Colon cancer in mat GF.   Past Medical History:  Diagnosis Date  . Chronic back pain   . Hiatal hernia   . Hypercholesteremia   . Hypertension   . Numbness     Past Surgical History:  Procedure Laterality Date  . PARTIAL HYSTERECTOMY    . TOE SURGERY Bilateral     Prior to Admission medications   Atorvastatin 40 mg daily Baclofen 10 mg p.o. 3 times daily Duloxetine 60 mg daily Metoclopramide, dose not noted 1 tablet p.o. 3 times daily Pantoprazole 40 mg daily  Scheduled Meds:  Infusions:  PRN Meds:    Allergies as of 03/15/2018 - Review Complete 03/15/2018  Allergen Reaction Noted  . Codeine Nausea Only 03/06/2011  . Lisinopril Swelling 12/11/2014    Family History  Problem Relation Age of Onset  . Lung cancer Father   . Heart disease Mother   . Heart disease Father   . Hypertension Mother   . Hypertension Father     Social History   Socioeconomic History  . Marital status: Divorced    Spouse name: Not on file  . Number of children: 3  . Years of education: 9  . Highest education level: Not on file  Occupational History  . Occupation: Disabled  Social Needs  . Financial resource strain: Not on file  . Food insecurity:    Worry: Not on file    Inability: Not on file  . Transportation needs:    Medical: Not on file    Non-medical: Not on file  Tobacco Use  . Smoking status: Never Smoker  Substance and Sexual Activity  . Alcohol use: No    Alcohol/week: 0.0 standard  drinks  . Drug use: Yes    Types: Marijuana    Comment: Currently smokes about 7 marijuana "joints" per week.  Marland Kitchen Sexual activity: Not on file  Lifestyle  . Physical activity:    Days per week: Not on file    Minutes per session: Not on file  . Stress: Not on file  Relationships  . Social connections:    Talks on phone: Not on file    Gets together: Not on file    Attends religious service: Not on file    Active member of club or organization: Not on file    Attends meetings of clubs or organizations: Not on file    Relationship status: Not on file  . Intimate partner violence:    Fear of current or ex partner: Not on file    Emotionally abused: Not on  file    Physically abused: Not on file    Forced sexual activity: Not on file  Other Topics Concern  . Not on file  Social History Narrative   Lives at home alone.   Right-handed.   1 cup caffeine per day.    REVIEW OF SYSTEMS: Constitutional: Weakness. ENT:  No nose bleeds Pulm: Denies shortness of breath or cough CV:  No palpitations, no LE edema.  No chest pain GU:  No hematuria, no frequency GI:  Per HPI Heme: Denies unusual or excessive bleeding or bruising. Transfusions: No recall of previous transfusions. Neuro:'s of sensation in her feet.  Poor balance. Derm:  No itching, no rash or sores.  Endocrine:  No sweats or chills.  No polyuria or dysuria Immunization: Did not inquire as to her vaccinations. Travel:  None beyond local counties in last few months.    PHYSICAL EXAM: Vital signs in last 24 hours: Vitals:   03/15/18 2335  BP: (!) 144/91  Pulse: 72  Temp: 97.6 F (36.4 C)  SpO2: 92%   Wt Readings from Last 3 Encounters:  12/21/14 68 kg  12/16/14 68.9 kg  12/11/14 71.2 kg   78.8 KG 03/14/18  General: Somewhat ill, somnolent African-American female.  Resting comfortably. Head: Facial asymmetry or swelling.  No signs of head trauma. Eyes: Scleral icterus.  No conjunctival pallor. Ears: Not hard  of hearing Nose: No discharge Mouth: Oral mucosa pink, moist, clear.  Tongue midline. Neck: No JVD, no thyromegaly, no masses Lungs: Labored breathing or cough.  Lungs clear bilaterally Heart: RRR.  No MRG.  S1, S2 present Abdomen: Soft.  Tender in the upper abdomen.  No guarding or rebound.  Bowel sounds normal but hypoactive.  No distention.  No HSM, masses do not appreciate hernias. Rectal: Owens Shark, soft stool in the rectum is FOBT negative.  No masses. Musc/Skeltl: No joint swelling, redness or gross deformity. Extremities: No CCE. Neurologic: Drowsy but arousable.  Moves all 4 limbs, no tremor, no gross weakness or deficits. Skin: No rashes, no sores. Tattoos: At the sacral region. Nodes: No cervical adenopathy. Psych: Does not, cooperative, affect blunted.  Intake/Output from previous day: 11/18 0701 - 11/19 0700 In: 225.7 [I.V.:225.7] Out: -  Intake/Output this shift: No intake/output data recorded.  LAB RESULTS: Recent Labs    03/16/18 0256  WBC 9.4  HGB 12.0  HCT 36.3  PLT 254   BMET Lab Results  Component Value Date   NA 137 03/16/2018   NA 134 (L) 12/16/2014   NA 143 03/06/2011   K 3.2 (L) 03/16/2018   K 3.0 (L) 12/16/2014   K 3.1 (L) 03/06/2011   CL 100 03/16/2018   CL 92 (L) 12/16/2014   CL 103 03/06/2011   CO2 28 03/16/2018   CO2 28 12/16/2014   CO2 29 03/06/2011   GLUCOSE 101 (H) 03/16/2018   GLUCOSE 96 12/16/2014   GLUCOSE 107 (H) 03/06/2011   BUN <5 (L) 03/16/2018   BUN 6 12/16/2014   BUN 7 03/06/2011   CREATININE 0.66 03/16/2018   CREATININE 0.72 12/16/2014   CREATININE 0.54 03/06/2011   CALCIUM 9.0 03/16/2018   CALCIUM 9.9 12/16/2014   CALCIUM 10.3 03/06/2011   LFT Recent Labs    03/16/18 0256  PROT 6.6  ALBUMIN 3.3*  AST 20  ALT 16  ALKPHOS 76  BILITOT 1.0   Hepatitis Panel No results for input(s): HEPBSAG, HCVAB, HEPAIGM, HEPBIGM in the last 72 hours. Lipase     Component  Value Date/Time   LIPASE 19 (L) 12/16/2014 1622       RADIOLOGY STUDIES: No results found.    IMPRESSION:   *   Nausea, vomiting, epigastric pain.  Rule out ulcer disease. R/o  Marijuana associated hyperemesis.   Hx gastritis on EGD 2016.  *   Hypokalemia  *    Left adrenal nodule, work-up in progress.  MRI was suggested in order to further evaluate this.  tests not yet resulted from [place] or aldosterone and renin levels.  Consideration had been given to 24-hour urine collection for catecholamines and metanephrines.  *   Hx ileal carcinoid.  Resected 2016.    *   Tubular adenomatous colon polyps 2016.  Has not had repeat colonoscopy.  *   S/p PEG x 9 months 2017 - 2018.  Performed for chronic nausea and vomiting similar to current symptoms.    PLAN:     *   EGD planned.  Hopefully this can be performed around 215 today. Ordered 20 meq Potassium IV to correct hypokalemia.    *    Would suggest the adrenal mass be worked up and all urinary and blood tests be obtained to rule out pheochromocytoma   Azucena Freed  03/16/2018, 8:20 AM Phone [phone]     Attending Physician's note   I have taken a history, examined the patient and reviewed the chart. I agree with the Advanced Practitioner's note, impression and recommendations. Recurrent nausea, vomiting, epigastric pain. R/O marijuana related hyperemesis. R/O ulcer, gastritis, esophagitis. EGD today. Discontinue THC use. Further evaluation of adrenal lesion per primary service.    Lucio Edward, MD FACG [phone] office

## 2018-03-16 ENCOUNTER — Observation Stay (HOSPITAL_COMMUNITY): Payer: Medicaid Other | Admitting: Certified Registered Nurse Anesthetist

## 2018-03-16 ENCOUNTER — Other Ambulatory Visit: Payer: Self-pay

## 2018-03-16 ENCOUNTER — Encounter (HOSPITAL_COMMUNITY)
Admission: AD | Disposition: A | Discharge status: Home / Self Care | Payer: Self-pay | Source: Other Acute Inpatient Hospital | Attending: Family Medicine

## 2018-03-16 ENCOUNTER — Encounter (HOSPITAL_COMMUNITY): Payer: Self-pay | Admitting: *Deleted

## 2018-03-16 DIAGNOSIS — R1013 Epigastric pain: Secondary | ICD-10-CM

## 2018-03-16 DIAGNOSIS — F329 Major depressive disorder, single episode, unspecified: Secondary | ICD-10-CM

## 2018-03-16 DIAGNOSIS — D3500 Benign neoplasm of unspecified adrenal gland: Secondary | ICD-10-CM

## 2018-03-16 DIAGNOSIS — E162 Hypoglycemia, unspecified: Secondary | ICD-10-CM

## 2018-03-16 DIAGNOSIS — R112 Nausea with vomiting, unspecified: Secondary | ICD-10-CM | POA: Diagnosis present

## 2018-03-16 DIAGNOSIS — F32A Depression, unspecified: Secondary | ICD-10-CM

## 2018-03-16 DIAGNOSIS — I10 Essential (primary) hypertension: Secondary | ICD-10-CM

## 2018-03-16 DIAGNOSIS — F419 Anxiety disorder, unspecified: Secondary | ICD-10-CM

## 2018-03-16 HISTORY — DX: Anxiety disorder, unspecified: F41.9

## 2018-03-16 HISTORY — DX: Benign neoplasm of unspecified adrenal gland: D35.00

## 2018-03-16 HISTORY — PX: BIOPSY: SHX5522

## 2018-03-16 HISTORY — DX: Hypoglycemia, unspecified: E16.2

## 2018-03-16 HISTORY — PX: ESOPHAGOGASTRODUODENOSCOPY (EGD) WITH PROPOFOL: SHX5813

## 2018-03-16 HISTORY — DX: Nausea with vomiting, unspecified: R11.2

## 2018-03-16 HISTORY — DX: Depression, unspecified: F32.A

## 2018-03-16 LAB — CBC
HEMATOCRIT: 36.3 % (ref 36.0–46.0)
Hemoglobin: 12 g/dL (ref 12.0–15.0)
MCH: 30.5 pg (ref 26.0–34.0)
MCHC: 33.1 g/dL (ref 30.0–36.0)
MCV: 92.1 fL (ref 80.0–100.0)
NRBC: 0 % (ref 0.0–0.2)
PLATELETS: 254 10*3/uL (ref 150–400)
RBC: 3.94 MIL/uL (ref 3.87–5.11)
RDW: 12.4 % (ref 11.5–15.5)
WBC: 9.4 10*3/uL (ref 4.0–10.5)

## 2018-03-16 LAB — GLUCOSE, CAPILLARY
GLUCOSE-CAPILLARY: 114 mg/dL — AB (ref 70–99)
GLUCOSE-CAPILLARY: 87 mg/dL (ref 70–99)
Glucose-Capillary: 97 mg/dL (ref 70–99)
Glucose-Capillary: 104 mg/dL — ABNORMAL HIGH (ref 70–99)
Glucose-Capillary: 88 mg/dL (ref 70–99)

## 2018-03-16 LAB — COMPREHENSIVE METABOLIC PANEL
ALBUMIN: 3.3 g/dL — AB (ref 3.5–5.0)
ALK PHOS: 76 U/L (ref 38–126)
ALT: 16 U/L (ref 0–44)
ANION GAP: 9 (ref 5–15)
AST: 20 U/L (ref 15–41)
BILIRUBIN TOTAL: 1 mg/dL (ref 0.3–1.2)
BUN: <5 mg/dL — ABNORMAL LOW (ref 8–23)
CHLORIDE: 100 mmol/L (ref 98–111)
CO2: 28 mmol/L (ref 22–32)
Calcium: 9 mg/dL (ref 8.9–10.3)
Creatinine, Ser: 0.66 mg/dL (ref 0.44–1.00)
GFR calc non Af Amer: >60 mL/min (ref >60)
GLUCOSE: 101 mg/dL — AB (ref 70–99)
Potassium: 3.2 mmol/L — ABNORMAL LOW (ref 3.5–5.1)
Sodium: 137 mmol/L (ref 135–145)
Total Protein: 6.6 g/dL (ref 6.5–8.1)

## 2018-03-16 LAB — HIV ANTIBODY (ROUTINE TESTING W REFLEX): HIV Screen 4th Generation wRfx: NONREACTIVE

## 2018-03-16 LAB — MAGNESIUM: Magnesium: 1.8 mg/dL (ref 1.7–2.4)

## 2018-03-16 SURGERY — ESOPHAGOGASTRODUODENOSCOPY (EGD) WITH PROPOFOL
Anesthesia: Monitor Anesthesia Care

## 2018-03-16 MED ORDER — POTASSIUM CHLORIDE 10 MEQ/100ML IV SOLN
10.0000 meq | INTRAVENOUS | Status: AC
  Administered 2018-03-16 (×2): 10 meq via INTRAVENOUS
  Filled 2018-03-16 (×2): qty 100

## 2018-03-16 MED ORDER — PANTOPRAZOLE SODIUM 40 MG PO TBEC
40.0000 mg | DELAYED_RELEASE_TABLET | Freq: Every day | ORAL | Status: DC
  Administered 2018-03-16 – 2018-03-18 (×3): 40 mg via ORAL
  Filled 2018-03-16 (×3): qty 1

## 2018-03-16 MED ORDER — METOCLOPRAMIDE HCL 5 MG/ML IJ SOLN
5.0000 mg | Freq: Four times a day (QID) | INTRAMUSCULAR | Status: DC
  Administered 2018-03-16 – 2018-03-17 (×7): 5 mg via INTRAVENOUS
  Filled 2018-03-16 (×8): qty 2

## 2018-03-16 MED ORDER — ACETAMINOPHEN 325 MG PO TABS
650.0000 mg | ORAL_TABLET | Freq: Four times a day (QID) | ORAL | Status: DC | PRN
  Administered 2018-03-17 – 2018-03-19 (×4): 650 mg via ORAL
  Filled 2018-03-16 (×4): qty 2

## 2018-03-16 MED ORDER — LACTATED RINGERS IV SOLN
INTRAVENOUS | Status: DC | PRN
  Administered 2018-03-16: 14:00:00 via INTRAVENOUS

## 2018-03-16 MED ORDER — PROPOFOL 10 MG/ML IV BOLUS
INTRAVENOUS | Status: DC | PRN
  Administered 2018-03-16 (×4): 15 mg via INTRAVENOUS

## 2018-03-16 MED ORDER — LIDOCAINE 2% (20 MG/ML) 5 ML SYRINGE
INTRAMUSCULAR | Status: DC | PRN
  Administered 2018-03-16: 80 mg via INTRAVENOUS

## 2018-03-16 MED ORDER — PROCHLORPERAZINE EDISYLATE 10 MG/2ML IJ SOLN
10.0000 mg | Freq: Four times a day (QID) | INTRAMUSCULAR | Status: DC
  Administered 2018-03-16 – 2018-03-19 (×12): 10 mg via INTRAVENOUS
  Filled 2018-03-16 (×13): qty 2

## 2018-03-16 MED ORDER — ONDANSETRON HCL 4 MG/2ML IJ SOLN
4.0000 mg | Freq: Four times a day (QID) | INTRAMUSCULAR | Status: DC | PRN
  Administered 2018-03-16 – 2018-03-18 (×4): 4 mg via INTRAVENOUS
  Filled 2018-03-16 (×3): qty 2

## 2018-03-16 MED ORDER — ACETAMINOPHEN 650 MG RE SUPP: 650.0000 mg | Freq: Four times a day (QID) | RECTAL | Status: DC | PRN

## 2018-03-16 MED ORDER — ENOXAPARIN SODIUM 40 MG/0.4ML ~~LOC~~ SOLN
40.0000 mg | SUBCUTANEOUS | Status: DC
  Administered 2018-03-16 – 2018-03-19 (×4): 40 mg via SUBCUTANEOUS
  Filled 2018-03-16 (×4): qty 0.4

## 2018-03-16 MED ORDER — PROCHLORPERAZINE EDISYLATE 10 MG/2ML IJ SOLN
5.0000 mg | INTRAMUSCULAR | Status: DC | PRN
  Filled 2018-03-16: qty 1

## 2018-03-16 MED ORDER — PROPOFOL 500 MG/50ML IV EMUL
INTRAVENOUS | Status: DC | PRN
  Administered 2018-03-16: 80 ug/kg/min via INTRAVENOUS

## 2018-03-16 MED ORDER — DEXTROSE-NACL 5-0.9 % IV SOLN
INTRAVENOUS | Status: AC
  Administered 2018-03-16: 04:00:00 via INTRAVENOUS

## 2018-03-16 SURGICAL SUPPLY — 15 items

## 2018-03-16 NOTE — H&P (Addendum)
History and Physical    Kristi Romero GYK:[id number] DOB: Jan 29, 1956 DOA: 03/15/2018  PCP: Raelyn Number, MD Patient coming from: [facility]  Chief Complaint: Intractable nausea and vomiting  HPI: Kristi Romero is a 62 y.o. female with medical history significant of hypertension, hyperlipidemia, depression, anxiety, history of PEG tube (unspecified reason) with removal in January 2018 who presented to [facility] ED with persistent nausea and vomiting.  Patient reports having nausea and NBNB emesis for the past 3 to 4 months.  States the problem has been getting progressively worse.  States she last vomited 4 days ago and her nausea has improved but she is still not able to tolerate p.o. intake.  Reports having generalized abdominal pain which is worse in the left upper quadrant for the past 2 weeks.  Denies having any diarrhea.  Reports smoking marijuana.  Reports having a feeding tube in the past because she was not able to eat; feeding tube was removed last year.  Reports having an EGD done a year ago.  ED Course at [facility]: Initial vitals showing blood pressure 154/87, pulse 84, respiratory rate 18, temperature 98.7 F, SPO2 97% on room air.  Labs revealed potassium 3.0.  CBC and CMP were otherwise largely unremarkable.  Abdominal x-ray negative.  She received 2 L IV fluid, IV Zofran, and IV Phenergan.  On reevaluation by the ED provider, patient reported worsening pain and was noted to be diaphoretic and tachycardic with bigeminy on telemetry.  O2 saturations and pulse ox decreased and ABG was obtained which was within normal limits.  UDS was obtained and was positive for marijuana.  CTA chest/thorax/abdomen/pelvis was obtained.  Imaging was negative for acute PE.  A stable subcentimeter right adrenal nodule was seen as well as a 1.8 x 2.1 cm left adrenal nodule which demonstrated slow growth compared to 2014.  There was no bowel obstruction seen and appearance of right lower quadrant  anastomosis was unremarkable.  Patient has a history of prior cholecystectomy and hysterectomy.  Hospital course at [facility]: Patient was admitted on March 12, 2018.  She continued to complain of persistent nausea and vomiting with inability to consume oral intake.  A gastric emptying study was ordered, however, patient was unable to tolerate imaging due to vomiting.  Hospitalist Dr. Cyndia Skeeters at [facility] consulted gastroenterology at [facility] Dr. Michail Sermon and Dr. Lyndel Safe who recommended transfer to [facility] for an EGD.  Patient noted to be hypoglycemic November 17 overnight due to poor p.o. intake and was started on D5 saline drip.  Because of unexplained diaphoresis and tachycardia on admission in the context of a small adrenal lesion pheochromocytoma work-up had been initiated with consideration given to initiating 24-hour urine collection for catecholamines and metanephrines.  There were also concerns regarding hyperaldosteronism so aldosterone level as well as renin activity levels were collected but did not result at the time of discharge.  Over the weekend, a telephone consultation with the patient's primary gastroenterologist Dr. Lyndel Safe was done and he recommended Compazine as needed and Reglan 4 times daily.  Review of Systems: As per HPI otherwise 10 point review of systems negative.  Past Medical History:  Diagnosis Date  . Chronic back pain   . Hiatal hernia   . Hypercholesteremia   . Hypertension   . Numbness     Past Surgical History:  Procedure Laterality Date  . PARTIAL HYSTERECTOMY    . TOE SURGERY Bilateral      reports that she has never smoked. She does  not have any smokeless tobacco history on file. She reports that she has current or past drug history. Drug: Marijuana. She reports that she does not drink alcohol.  Allergies  Allergen Reactions  . Codeine Nausea Only  . Lisinopril Swelling    Family History  Problem Relation Age of Onset  . Lung cancer Father     . Heart disease Mother   . Heart disease Father   . Hypertension Mother   . Hypertension Father     Prior to Admission medications   Medication Sig Start Date End Date Taking? Authorizing Provider  ALPRAZolam Duanne Moron) 0.5 MG tablet Take 1-2 tablets 30 minutes prior to scan.  May take one additional tablet, if needed, at time of scan.  May cause drowsiness. Must have driver. 12/25/14   Melvenia Beam, MD  ciprofloxacin (CIPRO) 500 MG tablet TK 1 T PO  BID 09/27/14   [provider]  cyclobenzaprine (FLEXERIL) 10 MG tablet Take 10 mg by mouth 3 (three) times daily as needed.  11/16/14   [provider]  Linaclotide (LINZESS) 290 MCG CAPS capsule Take 290 mcg by mouth daily.    [provider]  LYRICA 75 MG capsule Take 75 mg by mouth 2 (two) times daily.  12/05/14   [provider]  metoprolol tartrate (LOPRESSOR) 25 MG tablet Take 25 mg by mouth 2 (two) times daily.    [provider]  nystatin (MYCOSTATIN) 100000 UNIT/ML suspension RINSE AND SPIT WITH 5ML PO QID. ALSO SOAK DENTURE IN SUSPENSION QD 11/22/14   [provider]  ondansetron (ZOFRAN-ODT) 4 MG disintegrating tablet TK 1 T PO Q 6 HOURS 09/27/14   [provider]  pantoprazole (PROTONIX) 40 MG tablet TAKE 1 TABLET BY MOUTH EVERY DAY 09/27/14   [provider]  sucralfate (CARAFATE) 1 GM/10ML suspension Take 10 mLs (1 g total) by mouth 4 (four) times daily -  with meals and at bedtime. 12/16/14   Kirstie Peri, MD    Physical Exam: Vitals:   03/15/18 2335  BP: (!) 144/91  Pulse: 72  Temp: 97.6 F (36.4 C)  TempSrc: Oral  SpO2: 92%    Physical Exam  Constitutional: She is oriented to person, place, and time. No distress.  Resting comfortably in a hospital bed  HENT:  Head: Normocephalic.  Mouth/Throat: Oropharynx is clear and moist.  Eyes: Right eye exhibits no discharge. Left eye exhibits no discharge.  Neck: Neck supple. No tracheal deviation present.   Cardiovascular: Normal rate, regular rhythm and intact distal pulses.  Pulmonary/Chest: Effort normal and breath sounds normal. No respiratory distress. She has no wheezes. She has no rales.  Abdominal: Soft. Bowel sounds are normal. She exhibits no distension. There is tenderness. There is no rebound and no guarding.  Generalized tenderness to palpation  Musculoskeletal: She exhibits no edema.  Neurological: She is alert and oriented to person, place, and time.  Skin: Skin is warm and dry. She is not diaphoretic.     Labs on Admission: I have personally reviewed following labs and imaging studies  CBC: No results for input(s): WBC, NEUTROABS, HGB, HCT, MCV, PLT in the last 168 hours. Basic Metabolic Panel: No results for input(s): NA, K, CL, CO2, GLUCOSE, BUN, CREATININE, CALCIUM, MG, PHOS in the last 168 hours. GFR: CrCl cannot be calculated (Patient's most recent lab result is older than the maximum 21 days allowed.). Liver Function Tests: No results for input(s): AST, ALT, ALKPHOS, BILITOT, PROT, ALBUMIN in the last 168  hours. No results for input(s): LIPASE, AMYLASE in the last 168 hours. No results for input(s): AMMONIA in the last 168 hours. Coagulation Profile: No results for input(s): INR, PROTIME in the last 168 hours. Cardiac Enzymes: No results for input(s): CKTOTAL, CKMB, CKMBINDEX, TROPONINI in the last 168 hours. BNP (last 3 results) No results for input(s): PROBNP in the last 8760 hours. HbA1C: No results for input(s): HGBA1C in the last 72 hours. CBG: Recent Labs  Lab 03/16/18 0233  GLUCAP 97   Lipid Profile: No results for input(s): CHOL, HDL, LDLCALC, TRIG, CHOLHDL, LDLDIRECT in the last 72 hours. Thyroid Function Tests: No results for input(s): TSH, T4TOTAL, FREET4, T3FREE, THYROIDAB in the last 72 hours. Anemia Panel: No results for input(s): VITAMINB12, FOLATE, FERRITIN, TIBC, IRON, RETICCTPCT in the last 72 hours. Urine analysis:    Component Value  Date/Time   COLORURINE YELLOW 03/06/2011 2040   APPEARANCEUR HAZY (A) 03/06/2011 2040   LABSPEC 1.022 03/06/2011 2040   PHURINE 8.5 (H) 03/06/2011 2040   GLUCOSEU NEGATIVE 03/06/2011 2040   HGBUR SMALL (A) 03/06/2011 2040   BILIRUBINUR NEGATIVE 03/06/2011 2040   KETONESUR 15 (A) 03/06/2011 2040   PROTEINUR 30 (A) 03/06/2011 2040   UROBILINOGEN 1.0 03/06/2011 2040   NITRITE NEGATIVE 03/06/2011 2040   LEUKOCYTESUR NEGATIVE 03/06/2011 2040    Radiological Exams on Admission: No results found.  Assessment/Plan Principal Problem:   Intractable nausea and vomiting Active Problems:   Hyperlipidemia   Hypoglycemia   Adrenal adenoma   Hypertension   Anxiety and depression   Intractable nausea and vomiting; Inability to tolerate p.o. Intake In the setting of marijuana; UDS for positive for marijuana on admission at [facility]. Patient has a history of prior cholecystectomy and hysterectomy.  CTA done at [facility]  did not show any acute bowel pathology.  Patient was not able to tolerate gastric emptying study due to vomiting.  Hospitalist Dr. Cyndia Skeeters at [place] had consulted gastroenterology -both Dr. Michail Sermon and patient's primary gastroenterologist Dr. Lyndel Safe who recommended transferring the patient to [facility] for an EGD. Over the weekend, a telephone consultation with the patient's primary gastroenterologist Dr. Lyndel Safe was done and he recommended Compazine as needed and Reglan 4 times daily -IV fluid hydration -IV Reglan 5 mg every 6 hours -IV Compazine 5 mg every 4 hours as needed -IV Zofran every 6 hours as needed -Order repeat labs: CBC, CMP, magnesium level -Monitor for electrolyte abnormalities; replete prn   -Keep n.p.o. Overnight -Please call gastroenterology in the morning.  Hypoglycemia secondary to diminished oral intake Noted at [facility] November 17 overnight. -Continue D5-normal saline drip -CBG checks every 4 hours  Adrenal adenoma seen on CT imaging at [facility] A  stable subcentimeter right adrenal nodule was seen as well as a 1.8 x 2.1 cm left adrenal nodule which demonstrated slow growth compared to 2014.  There were also concerns regarding hyperaldosteronism due to tachycardia and diaphoresis on admission at [facility], aldosterone level as well as renin activity levels were collected but did not result at the time of discharge.  -Radiologist recommended further evaluation with outpatient adrenal MRI -Continue monitor; patient is currently not hypertensive, diaphoretic, or tachycardic  Hypertension Patient is not on antihypertensives at home per [facility] record.  Unable to get additional history from the patient at this time.  Pharmacy medication reconciliation pending. -Continue to monitor  Hyperlipidemia -Unable to get additional history from the patient at this time.  Pharmacy medication reconciliation pending. -Resume p.o. meds when patient is able to tolerate  Depression and anxiety -Unable to get additional history from the patient at this time.  Pharmacy medication reconciliation pending.  Patient denies having any history of depression or taking any antidepressants.  DVT prophylaxis: Lovenox Code Status: Patient wishes to be full code. Family Communication: No family available. Disposition Plan: Anticipate discharge when patient is able to tolerate p.o. intake. Consults called: Dr. Lyndel Safe from gastroenterology was consulted by hospitalist at [facility]. Please call gastroenterology in the morning. Admission status: Observation  Shela Leff MD [facility] Pager [phone]  If 7PM-7AM, please contact night-coverage [email or web address] Password [email or web address]  03/16/2018, 2:59 AM

## 2018-03-16 NOTE — Progress Notes (Signed)
Patient admitted by Dr Marlowe Sax this morning. Patient tx from [facility] for further care.  She is sitting up at the side of bed without any complaints. Feels ok but hungry. Awating her EGD.  GI notified this morning about her arrival, plan EGD today. Once cleared by GI she can eat.   I will request any updated lab work from [facility] at this time regarding Adrenal Mass labs, if not we will have to do it here including Plasma aldosterone levels, Plasma renin activity, 24 hr Urinary fx Metanephrines & Catecholamines. She will end up needing outpatient MRI of adrenal.   Further plan to be developed after EGD. Call with questions as needed.  Gerlean Ren MD [facility]

## 2018-03-16 NOTE — Anesthesia Procedure Notes (Signed)
Procedure Name: MAC Date/Time: 03/16/2018 2:29 PM Performed by: White, Amedeo Plenty, CRNA Pre-anesthesia Checklist: Patient identified, Emergency Drugs available, Suction available and Patient being monitored Patient Re-evaluated:Patient Re-evaluated prior to induction Oxygen Delivery Method: Nasal cannula

## 2018-03-16 NOTE — Op Note (Addendum)
[facility] Patient Name: Kristi Romero Procedure Date : 03/16/2018 MRN: [id number] Attending MD: Ladene Artist , MD Date of Birth: 06/30/1955 CSN: [id number] Age: 62 Admit Type: Inpatient Procedure:                Upper GI endoscopy Indications:              Epigastric abdominal pain, Nausea with vomiting Providers:                Pricilla Riffle. Fuller Plan, MD, Burtis Junes, RN, Alan Mulder,                            Technician, Lavona Mound, CRNA Referring MD:             [facility] Medicines:                Monitored Anesthesia Care Complications:            No immediate complications. Estimated Blood Loss:     Estimated blood loss was minimal. Procedure:                Pre-Anesthesia Assessment:                           - Prior to the procedure, a History and Physical                            was performed, and patient medications and                            allergies were reviewed. The patient's tolerance of                            previous anesthesia was also reviewed. The risks                            and benefits of the procedure and the sedation                            options and risks were discussed with the patient.                            All questions were answered, and informed consent                            was obtained. Prior Anticoagulants: The patient has                            taken no previous anticoagulant or antiplatelet                            agents. ASA Grade Assessment: III - A patient with                            severe systemic disease. After reviewing the risks  and benefits, the patient was deemed in                            satisfactory condition to undergo the procedure.                           After obtaining informed consent, the endoscope was                            passed under direct vision. Throughout the                            procedure, the patient's blood  pressure, pulse, and                            oxygen saturations were monitored continuously. The                            [id number]-H190 ([id number]) Olympus Adult EGD was introduced                            through the mouth, and advanced to the second part                            of duodenum. The upper GI endoscopy was                            accomplished without difficulty. The patient                            tolerated the procedure well. Scope In: Scope Out: Findings:      The examined esophagus was normal.      A few localized, small non-bleeding erosions were found in the gastric       body and in the gastric antrum. There were no stigmata of recent       bleeding.      A single 8 mm papule (nodule) with no bleeding and no stigmata of recent       bleeding was found in the gastric antrum. Biopsies were taken with a       cold forceps for histology.      Patchy mildly erythematous mucosa without bleeding was found in the       gastric body and in the gastric antrum. Biopsies were taken with a cold       forceps for histology.      A small hiatal hernia was present.      The exam of the stomach was otherwise normal.      A few localized erosions without bleeding were found in the duodenal       bulb and in the second portion of the duodenum.      The exam of the duodenum was otherwise normal. Impression:               - Normal esophagus.                           -  Non-bleeding erosive gastropathy.                           - A single papule (nodule) found in the stomach.                            Biopsied.                           - Erythematous mucosa in the gastric body and                            antrum. Biopsied.                           - Small hiatal hernia.                           - Duodenal erosions without bleeding. Recommendation:           - Return patient to hospital ward for ongoing care.                           - Clear liquid diet today.                            - Continue present medications.                           - Await pathology results.                           - Protonix (pantoprazole) 40 mg PO daily.                           - Treat mild erosive gastritis and mild erosive                            duodenits however these findings are not sufficient                            to explain her symtoms.                           - GI follow up with Dr. Carmell Austria. Procedure Code(s):        --- Professional ---                           [phone], Esophagogastroduodenoscopy, flexible,                            transoral; with biopsy, single or multiple Diagnosis Code(s):        --- Professional ---                           K31.89, Other diseases of stomach and duodenum  K44.9, Diaphragmatic hernia without obstruction or                            gangrene                           K26.9, Duodenal ulcer, unspecified as acute or                            chronic, without hemorrhage or perforation                           R10.13, Epigastric pain                           R11.2, Nausea with vomiting, unspecified CPT copyright 2018 American Medical Association. All rights reserved. The codes documented in this report are preliminary and upon coder review may  be revised to meet current compliance requirements. Ladene Artist, MD 03/16/2018 2:45:55 PM This report has been signed electronically. Number of Addenda: 0

## 2018-03-16 NOTE — Anesthesia Preprocedure Evaluation (Addendum)
Anesthesia Evaluation  Patient identified by MRN, date of birth, ID band Patient awake    Reviewed: Allergy & Precautions, NPO status , Patient's Chart, lab work & pertinent test results, reviewed documented beta blocker date and time   History of Anesthesia Complications Negative for: history of anesthetic complications  Airway Mallampati: II  TM Distance: >3 FB Neck ROM: Full    Dental  (+) Edentulous Upper, Edentulous Lower   Pulmonary neg pulmonary ROS,    breath sounds clear to auscultation       Cardiovascular hypertension, Pt. on medications and Pt. 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, N/V workup for years   Endo/Other  negative endocrine ROS  Renal/GU negative Renal ROS     Musculoskeletal   Abdominal   Peds  Hematology negative hematology ROS (+)   Anesthesia Other Findings   Reproductive/Obstetrics                            Anesthesia Physical Anesthesia Plan  ASA: III  Anesthesia Plan: MAC   Post-op Pain Management:    Induction: Intravenous  PONV Risk Score and Plan: 2 and Treatment may vary due to age or medical condition  Airway Management Planned: Nasal Cannula  Additional Equipment: None  Intra-op Plan:   Post-operative Plan:   Informed Consent: I have reviewed the patients History and Physical, chart, labs and discussed the procedure including the risks, benefits and alternatives for the proposed anesthesia with the patient or authorized representative who has indicated his/her understanding and acceptance.   Dental advisory given  Plan Discussed with: CRNA and Surgeon  Anesthesia Plan Comments:         Anesthesia Quick Evaluation

## 2018-03-16 NOTE — Interval H&P Note (Signed)
History and Physical Interval Note:  03/16/2018 2:12 PM  Kristi Romero  has presented today for surgery, with the diagnosis of nausea, vomiting, epigastric pain  The various methods of treatment have been discussed with the patient and family. After consideration of risks, benefits and other options for treatment, the patient has consented to  Procedure(s): ESOPHAGOGASTRODUODENOSCOPY (EGD) WITH PROPOFOL (N/A) as a surgical intervention .  The patient's history has been reviewed, patient examined, no change in status, stable for surgery.  I have reviewed the patient's chart and labs.  Questions were answered to the patient's satisfaction.     Pricilla Riffle. Fuller Plan

## 2018-03-16 NOTE — Plan of Care (Signed)
  Problem: Pain Managment: Goal: General experience of comfort will improve Outcome: Progressing   Problem: Safety: Goal: Ability to remain free from injury will improve Outcome: Progressing   

## 2018-03-16 NOTE — Transfer of Care (Signed)
Immediate Anesthesia Transfer of Care Note  Patient: Kristi Romero  Procedure(s) Performed: ESOPHAGOGASTRODUODENOSCOPY (EGD) WITH PROPOFOL (N/A )  Patient Location: Endoscopy Unit  Anesthesia Type:MAC  Level of Consciousness: awake, alert  and patient cooperative  Airway & Oxygen Therapy: Patient Spontanous Breathing and Patient connected to nasal cannula oxygen  Post-op Assessment: Report given to RN and Post -op Vital signs reviewed and stable  Post vital signs: Reviewed and stable  Last Vitals:  Vitals Value Taken Time  BP    Temp    Pulse 72 03/16/2018  2:43 PM  Resp 24 03/16/2018  2:43 PM  SpO2 97 % 03/16/2018  2:43 PM  Vitals shown include unvalidated device data.  Last Pain:  Vitals:   03/16/18 1337  TempSrc: Oral  PainSc: 10-Worst pain ever      Patients Stated Pain Goal: 4 (78/24/23 5361)  Complications: No apparent anesthesia complications

## 2018-03-17 ENCOUNTER — Encounter (HOSPITAL_COMMUNITY): Payer: Self-pay | Admitting: General Practice

## 2018-03-17 DIAGNOSIS — F419 Anxiety disorder, unspecified: Secondary | ICD-10-CM | POA: Diagnosis present

## 2018-03-17 DIAGNOSIS — R112 Nausea with vomiting, unspecified: Secondary | ICD-10-CM

## 2018-03-17 DIAGNOSIS — Z885 Allergy status to narcotic agent status: Secondary | ICD-10-CM | POA: Diagnosis not present

## 2018-03-17 DIAGNOSIS — K2950 Unspecified chronic gastritis without bleeding: Secondary | ICD-10-CM | POA: Diagnosis present

## 2018-03-17 DIAGNOSIS — K319 Disease of stomach and duodenum, unspecified: Secondary | ICD-10-CM | POA: Diagnosis present

## 2018-03-17 DIAGNOSIS — I10 Essential (primary) hypertension: Secondary | ICD-10-CM | POA: Diagnosis present

## 2018-03-17 DIAGNOSIS — E162 Hypoglycemia, unspecified: Secondary | ICD-10-CM | POA: Diagnosis present

## 2018-03-17 DIAGNOSIS — X58XXXA Exposure to other specified factors, initial encounter: Secondary | ICD-10-CM | POA: Diagnosis present

## 2018-03-17 DIAGNOSIS — R1110 Vomiting, unspecified: Secondary | ICD-10-CM

## 2018-03-17 DIAGNOSIS — D3500 Benign neoplasm of unspecified adrenal gland: Secondary | ICD-10-CM

## 2018-03-17 DIAGNOSIS — Z79899 Other long term (current) drug therapy: Secondary | ICD-10-CM | POA: Diagnosis not present

## 2018-03-17 DIAGNOSIS — E7800 Pure hypercholesterolemia, unspecified: Secondary | ICD-10-CM | POA: Diagnosis present

## 2018-03-17 DIAGNOSIS — E876 Hypokalemia: Secondary | ICD-10-CM | POA: Diagnosis present

## 2018-03-17 DIAGNOSIS — K2980 Duodenitis without bleeding: Secondary | ICD-10-CM | POA: Diagnosis present

## 2018-03-17 DIAGNOSIS — F329 Major depressive disorder, single episode, unspecified: Secondary | ICD-10-CM | POA: Diagnosis present

## 2018-03-17 DIAGNOSIS — K269 Duodenal ulcer, unspecified as acute or chronic, without hemorrhage or perforation: Secondary | ICD-10-CM | POA: Diagnosis present

## 2018-03-17 DIAGNOSIS — T407X1A Poisoning by cannabis (derivatives), accidental (unintentional), initial encounter: Secondary | ICD-10-CM | POA: Diagnosis present

## 2018-03-17 DIAGNOSIS — Z801 Family history of malignant neoplasm of trachea, bronchus and lung: Secondary | ICD-10-CM | POA: Diagnosis not present

## 2018-03-17 DIAGNOSIS — R1013 Epigastric pain: Secondary | ICD-10-CM | POA: Diagnosis not present

## 2018-03-17 DIAGNOSIS — G8929 Other chronic pain: Secondary | ICD-10-CM | POA: Diagnosis present

## 2018-03-17 DIAGNOSIS — E785 Hyperlipidemia, unspecified: Secondary | ICD-10-CM | POA: Diagnosis present

## 2018-03-17 DIAGNOSIS — Z9049 Acquired absence of other specified parts of digestive tract: Secondary | ICD-10-CM | POA: Diagnosis not present

## 2018-03-17 DIAGNOSIS — Z888 Allergy status to other drugs, medicaments and biological substances status: Secondary | ICD-10-CM | POA: Diagnosis not present

## 2018-03-17 DIAGNOSIS — E269 Hyperaldosteronism, unspecified: Secondary | ICD-10-CM | POA: Diagnosis present

## 2018-03-17 DIAGNOSIS — Z90711 Acquired absence of uterus with remaining cervical stump: Secondary | ICD-10-CM | POA: Diagnosis not present

## 2018-03-17 DIAGNOSIS — Z8249 Family history of ischemic heart disease and other diseases of the circulatory system: Secondary | ICD-10-CM | POA: Diagnosis not present

## 2018-03-17 DIAGNOSIS — R000 Tachycardia, unspecified: Secondary | ICD-10-CM | POA: Diagnosis present

## 2018-03-17 HISTORY — DX: Nausea with vomiting, unspecified: R11.2

## 2018-03-17 HISTORY — DX: Vomiting, unspecified: R11.10

## 2018-03-17 LAB — BASIC METABOLIC PANEL
Anion gap: 9 (ref 5–15)
CHLORIDE: 95 mmol/L — AB (ref 98–111)
CO2: 31 mmol/L (ref 22–32)
CREATININE: 0.74 mg/dL (ref 0.44–1.00)
Calcium: 9.3 mg/dL (ref 8.9–10.3)
GFR calc Af Amer: >60 mL/min (ref >60)
GFR calc non Af Amer: >60 mL/min (ref >60)
GLUCOSE: 89 mg/dL (ref 70–99)
POTASSIUM: 3 mmol/L — AB (ref 3.5–5.1)
SODIUM: 135 mmol/L (ref 135–145)

## 2018-03-17 LAB — GLUCOSE, CAPILLARY
GLUCOSE-CAPILLARY: 76 mg/dL (ref 70–99)
GLUCOSE-CAPILLARY: 78 mg/dL (ref 70–99)
GLUCOSE-CAPILLARY: 80 mg/dL (ref 70–99)
GLUCOSE-CAPILLARY: 83 mg/dL (ref 70–99)
GLUCOSE-CAPILLARY: 84 mg/dL (ref 70–99)
Glucose-Capillary: 68 mg/dL — ABNORMAL LOW (ref 70–99)
Glucose-Capillary: 78 mg/dL (ref 70–99)
Glucose-Capillary: 70 mg/dL (ref 70–99)

## 2018-03-17 LAB — LIPASE, BLOOD: LIPASE: 24 U/L (ref 11–51)

## 2018-03-17 LAB — MAGNESIUM: MAGNESIUM: 1.5 mg/dL — AB (ref 1.7–2.4)

## 2018-03-17 MED ORDER — POTASSIUM CHLORIDE IN NACL 20-0.9 MEQ/L-% IV SOLN
INTRAVENOUS | Status: DC
  Administered 2018-03-17 – 2018-03-18 (×2): via INTRAVENOUS
  Filled 2018-03-17 (×2): qty 1000

## 2018-03-17 MED ORDER — MAGNESIUM SULFATE 2 GM/50ML IV SOLN
2.0000 g | Freq: Once | INTRAVENOUS | Status: AC
  Administered 2018-03-17: 2 g via INTRAVENOUS
  Filled 2018-03-17: qty 50

## 2018-03-17 MED ORDER — METOCLOPRAMIDE HCL 5 MG/ML IJ SOLN
5.0000 mg | Freq: Four times a day (QID) | INTRAMUSCULAR | Status: DC
  Administered 2018-03-17 – 2018-03-19 (×8): 5 mg via INTRAVENOUS
  Filled 2018-03-17 (×8): qty 2

## 2018-03-17 MED ORDER — CAPSAICIN 0.025 % EX CREA
TOPICAL_CREAM | Freq: Two times a day (BID) | CUTANEOUS | Status: DC
  Administered 2018-03-17: 11:00:00 via TOPICAL
  Filled 2018-03-17: qty 60

## 2018-03-17 MED ORDER — METOPROLOL TARTRATE 25 MG PO TABS
25.0000 mg | ORAL_TABLET | Freq: Two times a day (BID) | ORAL | Status: DC
  Administered 2018-03-17 – 2018-03-19 (×5): 25 mg via ORAL
  Filled 2018-03-17 (×6): qty 1

## 2018-03-17 NOTE — Anesthesia Postprocedure Evaluation (Signed)
Anesthesia Post Note  Patient: Kristi Romero  Procedure(s) Performed: ESOPHAGOGASTRODUODENOSCOPY (EGD) WITH PROPOFOL (N/A ) BIOPSY     Patient location during evaluation: Endoscopy Anesthesia Type: MAC Level of consciousness: awake and alert Pain management: pain level controlled Vital Signs Assessment: post-procedure vital signs reviewed and stable Respiratory status: spontaneous breathing, nonlabored ventilation, respiratory function stable and patient connected to nasal cannula oxygen Cardiovascular status: stable and blood pressure returned to baseline Postop Assessment: no apparent nausea or vomiting Anesthetic complications: no    Last Vitals:  Vitals:   03/17/18 0049 03/17/18 0434  BP: (!) 119/52 133/73  Pulse: 73 86  Resp: 16 14  Temp: 37.1 C 37.2 C  SpO2: 92% 100%    Last Pain:  Vitals:   03/17/18 0434  TempSrc: Oral  PainSc:                  Jawon Dipiero

## 2018-03-17 NOTE — Progress Notes (Addendum)
Progress Note    Kristi Romero  GBT:[id number] DOB: 1955/12/17  DOA: 03/15/2018 PCP: Raelyn Number, MD    Brief Narrative:   Medical records reviewed and are as summarized below:  Kristi Romero is an 62 y.o. female with medical history significant of hypertension, hyperlipidemia, depression, anxiety, history of PEG tube (unspecified reason) with removal in January 2018 who presented to [facility] ED with persistent nausea and vomiting.  Patient reports having nausea and NBNB emesis for the past 3 to 4 months.  States the problem has been getting progressively worse.  States she last vomited 4 days ago and her nausea has improved but she is still not able to tolerate p.o. intake.  Reports having generalized abdominal pain which is worse in the left upper quadrant for the past 2 weeks.  Denies having any diarrhea.  Reports smoking marijuana.  Reports having a feeding tube in the past because she was not able to eat; feeding tube was removed last year.  Reports having an EGD done a year ago. Assessment/Plan:   Principal Problem:   Intractable nausea and vomiting Active Problems:   Hyperlipidemia   Hypoglycemia   Adrenal adenoma   Hypertension   Anxiety and depression   Abdominal pain, epigastric  Intractable nausea and vomiting-- likely related to large amount of marijuana consumption (4 blunts/day) -improved with hot shower for about an hour -capsacin cream did not help --s/p EGD: no findings to support N/V-- gastritis- PPI added -check lipase  Hypoglycemia secondary to diminished oral intake -monitor CBGs  Adrenal adenoma seen on CT imaging at [facility] A stable subcentimeter right adrenal nodule was seen as well as a 1.8 x 2.1 cm left adrenal nodule which demonstrated slow growth compared to 2014.  There were also concerns regarding hyperaldosteronism due to tachycardia and diaphoresis on admission at [facility], aldosterone level as well as renin activity levels were collected but  did not result at the time of discharge.  -Radiologist recommended further evaluation with outpatient adrenal MRI -defer further work up to PCP  Hypertension -resume metoprolol  Hypokalemia -recheck K -check Mg  obesity Body mass index is 31.41 kg/m.  Patient continues to have abnormal electrolytes from not eating/drinking well.  Will replace K and Mg.  Recheck in AM.  Change status to inpt.   Family Communication/Anticipated D/C date and plan/Code Status   DVT prophylaxis: Lovenox ordered. Code Status: Full Code.  Family Communication: none Disposition Plan: pending   Medical Consultants:    GI   Subjective:   Hot shower helped with nausea temporarily  Objective:    Vitals:   03/17/18 0807 03/17/18 0841 03/17/18 0843 03/17/18 1242  BP: (!) 144/87 (!) 164/90 (!) 166/101 (!) 161/93  Pulse: 77 69 70 61  Resp: 18 20  20   Temp: 98.4 F (36.9 C)   98.4 F (36.9 C)  TempSrc: Oral   Oral  SpO2: 98% 97%  93%  Weight:      Height:        Intake/Output Summary (Last 24 hours) at 03/17/2018 1520 Last data filed at 03/17/2018 1200 Gross per 24 hour  Intake 1164.98 ml  Output -  Net 1164.98 ml   Filed Weights   03/16/18 1337  Weight: 83 kg    Exam: In bed, NAD rrr +BS, tender to palpation in epigastric region No LE edema   Data Reviewed:   I have personally reviewed following labs and imaging studies:  Labs: Labs show the following:   Basic  Metabolic Panel: Recent Labs  Lab 03/16/18 0256  NA 137  K 3.2*  CL 100  CO2 28  GLUCOSE 101*  BUN <5*  CREATININE 0.66  CALCIUM 9.0  MG 1.8   GFR Estimated Creatinine Clearance: 76 mL/min (by C-G formula based on SCr of 0.66 mg/dL). Liver Function Tests: Recent Labs  Lab 03/16/18 0256  AST 20  ALT 16  ALKPHOS 76  BILITOT 1.0  PROT 6.6  ALBUMIN 3.3*   No results for input(s): LIPASE, AMYLASE in the last 168 hours. No results for input(s): AMMONIA in the last 168 hours. Coagulation  profile No results for input(s): INR, PROTIME in the last 168 hours.  CBC: Recent Labs  Lab 03/16/18 0256  WBC 9.4  HGB 12.0  HCT 36.3  MCV 92.1  PLT 254   Cardiac Enzymes: No results for input(s): CKTOTAL, CKMB, CKMBINDEX, TROPONINI in the last 168 hours. BNP (last 3 results) No results for input(s): PROBNP in the last 8760 hours. CBG: Recent Labs  Lab 03/17/18 0047 03/17/18 0431 03/17/18 0801 03/17/18 0836 03/17/18 1135  GLUCAP 84 76 68* 78 78   D-Dimer: No results for input(s): DDIMER in the last 72 hours. Hgb A1c: No results for input(s): HGBA1C in the last 72 hours. Lipid Profile: No results for input(s): CHOL, HDL, LDLCALC, TRIG, CHOLHDL, LDLDIRECT in the last 72 hours. Thyroid function studies: No results for input(s): TSH, T4TOTAL, T3FREE, THYROIDAB in the last 72 hours.  Invalid input(s): FREET3 Anemia work up: No results for input(s): VITAMINB12, FOLATE, FERRITIN, TIBC, IRON, RETICCTPCT in the last 72 hours. Sepsis Labs: Recent Labs  Lab 03/16/18 0256  WBC 9.4    Microbiology No results found for this or any previous visit (from the past 240 hour(s)).  Procedures and diagnostic studies:  No results found.  Medications:   . capsaicin   Topical BID  . enoxaparin (LOVENOX) injection  40 mg Subcutaneous Q24H  . metoCLOPramide (REGLAN) injection  5 mg Intravenous Q6H  . metoprolol tartrate  25 mg Oral BID  . pantoprazole  40 mg Oral Q0600  . prochlorperazine  10 mg Intravenous Q6H   Continuous Infusions:   LOS: 1 day   Geradine Girt  [facility]   *Please refer to [email or web address], password [email or web address] to get updated schedule on who will round on this patient, as hospitalists switch teams weekly. If 7PM-7AM, please contact night-coverage at [email or web address], password [email or web address] for any overnight needs.  03/17/2018, 3:20 PM

## 2018-03-17 NOTE — Progress Notes (Addendum)
Daily Rounding Note  03/17/2018, 8:25 AM  LOS: 1 day   SUBJECTIVE:   Chief complaint: nausea and vomiting    Some nausea this AM after getting grape juice and cola to correct glucose of 68.  Did not vomit.   Soft, brown stool yesterday.   Some upper abd pain though this is better.    OBJECTIVE:         Vital signs in last 24 hours:    Temp:  [98.4 F (36.9 C)-98.9 F (37.2 C)] 98.4 F (36.9 C) (11/20 0807) Pulse Rate:  [69-97] 77 (11/20 0807) Resp:  [14-24] 18 (11/20 0807) BP: (119-187)/(52-103) 144/87 (11/20 0807) SpO2:  [92 %-100 %] 98 % (11/20 0807) Weight:  [83 kg] 83 kg (11/19 1337) Last BM Date: 03/10/18 Filed Weights   03/16/18 1337  Weight: 83 kg   General: looks tired but not ill   Heart: RRR Chest: clear bil.  No cough or dyspnea Abdomen: soft, active BS.  Diffuse tenderness to lightest touch bil in mid to upper abdomen.  No guard or rebound.  BS normal, active.    Extremities: no CCE Neuro/Psych:  Alert, orietnted x 3.  Moves all 4 limbs.  No tremors or gross deficits.    Intake/Output from previous day: 11/19 0701 - 11/20 0700 In: 1620 [P.O.:20; I.V.:1406.2; IV Piggyback:193.8] Out: 5 [Blood:5]  Intake/Output this shift: No intake/output data recorded.  Lab Results: Recent Labs    03/16/18 0256  WBC 9.4  HGB 12.0  HCT 36.3  PLT 254   BMET Recent Labs    03/16/18 0256  NA 137  K 3.2*  CL 100  CO2 28  GLUCOSE 101*  BUN <5*  CREATININE 0.66  CALCIUM 9.0   LFT Recent Labs    03/16/18 0256  PROT 6.6  ALBUMIN 3.3*  AST 20  ALT 16  ALKPHOS 76  BILITOT 1.0   PT/INR No results for input(s): LABPROT, INR in the last 72 hours. Hepatitis Panel No results for input(s): HEPBSAG, HCVAB, HEPAIGM, HEPBIGM in the last 72 hours.  Studies/Results: No results found.  Scheduled Meds: . enoxaparin (LOVENOX) injection  40 mg Subcutaneous Q24H  . metoCLOPramide (REGLAN) injection  5  mg Intravenous Q6H  . pantoprazole  40 mg Oral Q0600  . prochlorperazine  10 mg Intravenous Q6H   Continuous Infusions: PRN Meds:.acetaminophen **OR** acetaminophen, ondansetron (ZOFRAN) IV, prochlorperazine  ASSESMENT:   *  Close to 1 month of persistent N/V, abdominal pain.  Xray, CT imaging, labs unrevealing 11/19 EGD: Mild erosive gastro-duodenitis, gastric nodule (benign appearing).  Small HH. Biopsy path pending.  Daily large consumption of marijuana.   Scheduled po Protonix, low dose Reglan IV, IV compazine, prn Zofran in place.   Same constellation of sxs led to FTT and PEG placement for 9 months in 2017/18.    *  Ileal resection for neuroendocrine tumor 2016 at [facility]   No evidence of tumor or blockage on xray and CT at [facility].    *   Left adrenal nodule, present since 2014.  Renin, aldosterone levels drawn at [facility].    *    2016 adenomatous colon polyps.  Repeat study due but should be deferred to outpt setting, after resolution of N/V when she can complete bowel prep.     PLAN   *  Advance to soft diet.  Leave current po and IV meds in place.   Consider changing  back to her normal Reglan (5 vs 10 mg) po TID.     *   Hospitalist to coordinate wup of the adrenal nodule.  Note pt is followed at [facility] endocrine clinic in [facility], [address]. At last OV 06/2017, plan was repeat TFTs (normal), and thyroid US 01/2018.  I called the clinic and pt does not yet have an appt scheduled.   May be best to schedule a visit with that clilnic rather than repeating redndant or unnecessary studies.     Kristi Romero  03/17/2018, 8:25 AM Phone [phone]   Attending Physician Note   I have taken an interval history, reviewed the chart and examined the patient. I agree with the Advanced Practitioner's note, impression and recommendations.   Kristi Edward, MD FACG [phone]

## 2018-03-18 DIAGNOSIS — R112 Nausea with vomiting, unspecified: Secondary | ICD-10-CM

## 2018-03-18 LAB — GLUCOSE, CAPILLARY
GLUCOSE-CAPILLARY: 68 mg/dL — AB (ref 70–99)
GLUCOSE-CAPILLARY: 89 mg/dL (ref 70–99)
Glucose-Capillary: 75 mg/dL (ref 70–99)
Glucose-Capillary: 111 mg/dL — ABNORMAL HIGH (ref 70–99)

## 2018-03-18 MED ORDER — ONDANSETRON HCL 4 MG/2ML IJ SOLN
4.0000 mg | Freq: Four times a day (QID) | INTRAMUSCULAR | Status: DC
  Administered 2018-03-18 – 2018-03-19 (×5): 4 mg via INTRAVENOUS
  Filled 2018-03-18 (×5): qty 2

## 2018-03-18 MED ORDER — HYOSCYAMINE SULFATE 0.125 MG PO TABS
0.2500 mg | ORAL_TABLET | Freq: Three times a day (TID) | ORAL | Status: DC
  Filled 2018-03-18 (×4): qty 2

## 2018-03-18 MED ORDER — HYOSCYAMINE SULFATE 0.125 MG SL SUBL
0.2500 mg | SUBLINGUAL_TABLET | Freq: Three times a day (TID) | SUBLINGUAL | Status: DC
  Administered 2018-03-18 – 2018-03-19 (×3): 0.25 mg via SUBLINGUAL
  Filled 2018-03-18 (×6): qty 2

## 2018-03-18 MED ORDER — PANTOPRAZOLE SODIUM 40 MG PO TBEC
40.0000 mg | DELAYED_RELEASE_TABLET | Freq: Two times a day (BID) | ORAL | Status: DC
  Administered 2018-03-18 – 2018-03-19 (×3): 40 mg via ORAL
  Filled 2018-03-18 (×3): qty 1

## 2018-03-18 NOTE — Progress Notes (Signed)
Patient Demographics:    Kristi Romero, is a 62 y.o. female, DOB - 1956-01-14, TIW:[id number]  Admit date - 03/15/2018   Admitting Physician Shela Leff, MD  Outpatient Primary MD for the patient is Raelyn Number, MD  LOS - 2   No chief complaint on file.       Subjective:    Kristi Romero today has no fevers,   No chest pain, nausea and vomiting persist,  Assessment  & Plan :    Principal Problem:   Intractable nausea and vomiting Active Problems:   Hyperlipidemia   Hypoglycemia   Adrenal adenoma   Hypertension   Anxiety and depression   Abdominal pain, epigastric   Vomiting  Brief summary 62 year old transferred from [facility] on 03/16/2018 with persistent nausea and vomiting in the setting of ongoing cannabis use, EGD as noted below,  Plan:- 1) cannabis hyperemesis syndrome- 11/19 EGD: Mild erosive gastro-duodenitis, gastric nodule (benign appearing). Small HH. Biopsy path shows mild reactive gastropathy and mild chronic gastritis, anti- emetics  adjusted by GI service on 03/18/2018..... Emesis remains uncontrolled, unable to tolerate significant amount of oral intake, still requiring IV hydration  2)Ileal resection for neuroendocrine tumor 2016 at [facility]No evidence of tumor or blockage on xray and CT at [facility]---  3)Adrenal adenoma seen on CT imaging at [facility]-  -present since 2014, Renin, aldosterone levels drawn at [facility], A stable subcentimeter right adrenal nodule was seen as well as a 1.8x2.1 cm left adrenal nodule which demonstrated slow growth compared to 2014. There were also concerns regarding hyperaldosteronismdue to tachycardia and diaphoresison admission at [place],aldosterone level as well as renin activity levels were collected but did not result at the time of discharge.  -Radiologist recommended further  evaluation with outpatient adrenal MRI -defer further work up to PCP  Disposition/Need for in-Hospital Stay- patient unable to be discharged at this time due to Emesis remains uncontrolled, unable to tolerate significant amount of oral intake, still requiring IV hydration   Code Status : full   Disposition Plan  : TBD   Consults  :  Gi   DVT Prophylaxis  :  Lovenox -   Lab Results  Component Value Date   PLT 254 03/16/2018    Inpatient Medications  Scheduled Meds: . enoxaparin (LOVENOX) injection  40 mg Subcutaneous Q24H  . hyoscyamine  0.25 mg Sublingual TID AC & HS  . metoCLOPramide (REGLAN) injection  5 mg Intravenous Q6H  . metoprolol tartrate  25 mg Oral BID  . ondansetron (ZOFRAN) IV  4 mg Intravenous Q6H  . pantoprazole  40 mg Oral BID AC  . prochlorperazine  10 mg Intravenous Q6H   Continuous Infusions: . 0.9 % NaCl with KCl 20 mEq / L 75 mL/hr at 03/18/18 1220   PRN Meds:.acetaminophen **OR** acetaminophen, prochlorperazine    Anti-infectives (From admission, onward)   None        Objective:   Vitals:   03/18/18 0450 03/18/18 1150 03/18/18 1151 03/18/18 1419  BP: (!) 169/91  139/75 (!) 157/87  Pulse: 63  64 60  Resp: 19  20 16   Temp:  98.7 F (37.1 C)  98.7 F (37.1 C)  TempSrc:  Axillary  Oral  SpO2: 98%  100% 97%  Weight:      Height:        Wt Readings from Last 3 Encounters:  03/16/18 83 kg  12/21/14 68 kg  12/16/14 68.9 kg     Intake/Output Summary (Last 24 hours) at 03/18/2018 1837 Last data filed at 03/18/2018 0400 Gross per 24 hour  Intake 244.65 ml  Output -  Net 244.65 ml     Physical Exam Patient is examined daily including today on 03/18/18 , exams remain the same as of yesterday except that has changed   Gen:- Awake Alert,  In no apparent distress  HEENT:- .AT, No sclera icterus Neck-Supple Neck,No JVD,.  Lungs-  CTAB , fair symmetrical air movement CV- S1, S2 normal, regular  Abd-  +ve B.Sounds, Abd Soft,  epigastric discomfort, no rebound or guarding,    Extremity/Skin:- No  edema, pedal pulses present  Psych-affect is appropriate, oriented x3 Neuro-no new focal deficits, no tremors   Data Review:   Micro Results No results found for this or any previous visit (from the past 240 hour(s)).  Radiology Reports No results found.   CBC Recent Labs  Lab 03/16/18 0256  WBC 9.4  HGB 12.0  HCT 36.3  PLT 254  MCV 92.1  MCH 30.5  MCHC 33.1  RDW 12.4    Chemistries  Recent Labs  Lab 03/16/18 0256 03/17/18 1541  NA 137 135  K 3.2* 3.0*  CL 100 95*  CO2 28 31  GLUCOSE 101* 89  BUN <5* <5*  CREATININE 0.66 0.74  CALCIUM 9.0 9.3  MG 1.8 1.5*  AST 20  --   ALT 16  --   ALKPHOS 76  --   BILITOT 1.0  --    ------------------------------------------------------------------------------------------------------------------ No results for input(s): CHOL, HDL, LDLCALC, TRIG, CHOLHDL, LDLDIRECT in the last 72 hours.  No results found for: HGBA1C ------------------------------------------------------------------------------------------------------------------ No results for input(s): TSH, T4TOTAL, T3FREE, THYROIDAB in the last 72 hours.  Invalid input(s): FREET3 ------------------------------------------------------------------------------------------------------------------ No results for input(s): VITAMINB12, FOLATE, FERRITIN, TIBC, IRON, RETICCTPCT in the last 72 hours.  Coagulation profile No results for input(s): INR, PROTIME in the last 168 hours.  No results for input(s): DDIMER in the last 72 hours.  Cardiac Enzymes No results for input(s): CKMB, TROPONINI, MYOGLOBIN in the last 168 hours.  Invalid input(s): CK ------------------------------------------------------------------------------------------------------------------ No results found for: BNP   Kristi Romero M.D on 03/18/2018 at 6:37 PM  Pager---[phone] Go to [email or web address] - password [email or web address] for contact  info  [facility] - Office  [phone]

## 2018-03-18 NOTE — Progress Notes (Addendum)
[facility] Progress Note  CC:  Nausea and vomiting  Subjective:  Complaining of epigastric pain this morning.  Says that she tried to eat some food yesterday and vomited.  Vomited once this morning as well.  Still complaining of a lot of nausea.  Drinking some ensure and tolerating that ok.  Reglan 5 mg IV every 6 hours started again yesterday.  Gastric biopsies just show chronic gastritis and reactive gastropathy-patient made aware.  Says that none of the nausea medications seem to work better for her over others.  Objective:  Vital signs in last 24 hours: Temp:  [98.4 F (36.9 C)-98.5 F (36.9 C)] 98.5 F (36.9 C) (11/20 2347) Pulse Rate:  [61-70] 63 (11/21 0450) Resp:  [18-20] 19 (11/21 0450) BP: (154-170)/(90-93) 169/91 (11/21 0450) SpO2:  [93 %-99 %] 98 % (11/21 0450) Last BM Date: 03/16/18 General:  Alert, Well-developed, in NAD; looks somewhat uncomfortable. Heart:  Regular rate and rhythm; no murmurs Pulm:  CTAB.  No increased WOB. Abdomen:  Soft, non-distended.  BS present.  Epigastric TTP. Extremities:  Without edema. Neurologic:  Alert and oriented x 4;  grossly normal neurologically. Psych:  Alert and cooperative. Normal mood and affect.  Intake/Output from previous day: 11/20 0701 - 11/21 0700 In: 389.7 [P.O.:385; I.V.:4.7] Out: -   Lab Results: Recent Labs    03/16/18 0256  WBC 9.4  HGB 12.0  HCT 36.3  PLT 254   BMET Recent Labs    03/16/18 0256 03/17/18 1541  NA 137 135  K 3.2* 3.0*  CL 100 95*  CO2 28 31  GLUCOSE 101* 89  BUN <5* <5*  CREATININE 0.66 0.74  CALCIUM 9.0 9.3   LFT Recent Labs    03/16/18 0256  PROT 6.6  ALBUMIN 3.3*  AST 20  ALT 16  ALKPHOS 76  BILITOT 1.0   Assessment / Plan: *  Close to 1 month of persistent N/V, abdominal pain.  Xray, CT imaging, labs unrevealing 11/19 EGD: Mild erosive gastro-duodenitis, gastric nodule (benign appearing).  Small HH. Biopsy path shows mild reactive gastropathy and  mild chronic gastritis.  Daily large consumption of marijuana.   Scheduled po Protonix once daily, low dose Reglan IV (5 mg IV every 6 hours just restarted yesterday), IV compazine, prn Zofran in place.  Same constellation of sxs led to FTT and PEG placement for 9 months in 2017/18.    *  Ileal resection for neuroendocrine tumor 2016 at [facility]   No evidence of tumor or blockage on xray and CT at [facility].    *   Left adrenal nodule, present since 2014.  Renin, aldosterone levels drawn at [facility].    *    2016 adenomatous colon polyps.  Repeat study due but should be deferred to outpt setting, after resolution of N/V when she can complete bowel prep.    -I changed zofran to scheduled dosing (4 mg IV every 6 hours) as well, to be alternated with her scheduled compazine doses. -? If we should increase PPI to BID.   LOS: 2 days   Laban Emperor. [name]  03/18/2018, 9:08 AM      Attending Physician Note   I have taken an interval history, reviewed the chart and examined the patient. I agree with the Advanced Practitioner's note, impression and recommendations. Improving but symptoms persist. Increased Protonix to 40 mg bid and added Levsin 0.25 mg po ac & hs. Consider changing Zofran to 4-8 mg  po ac & hs tomorrow and consider discharge tomorrow. Would not advance beyond a soft diet for several days. Importance of discontinuing THC use was stressed.   Lucio Edward, MD FACG [phone]

## 2018-03-19 ENCOUNTER — Encounter: Payer: Self-pay | Admitting: Gastroenterology

## 2018-03-19 LAB — GLUCOSE, CAPILLARY
GLUCOSE-CAPILLARY: 74 mg/dL (ref 70–99)
Glucose-Capillary: 79 mg/dL (ref 70–99)
Glucose-Capillary: 80 mg/dL (ref 70–99)
Glucose-Capillary: 81 mg/dL (ref 70–99)

## 2018-03-19 LAB — BASIC METABOLIC PANEL
Anion gap: 10 (ref 5–15)
CHLORIDE: 96 mmol/L — AB (ref 98–111)
CO2: 30 mmol/L (ref 22–32)
CREATININE: 0.79 mg/dL (ref 0.44–1.00)
Calcium: 9.3 mg/dL (ref 8.9–10.3)
GFR calc Af Amer: >60 mL/min (ref >60)
Glucose, Bld: 95 mg/dL (ref 70–99)
Potassium: 3.4 mmol/L — ABNORMAL LOW (ref 3.5–5.1)
SODIUM: 136 mmol/L (ref 135–145)

## 2018-03-19 LAB — MAGNESIUM: MAGNESIUM: 1.6 mg/dL — AB (ref 1.7–2.4)

## 2018-03-19 MED ORDER — ACETAMINOPHEN 325 MG PO TABS: 650.0000 mg | ORAL_TABLET | Freq: Four times a day (QID) | ORAL | 1 refills | Status: DC | PRN

## 2018-03-19 MED ORDER — POTASSIUM CHLORIDE CRYS ER 20 MEQ PO TBCR
40.0000 meq | EXTENDED_RELEASE_TABLET | Freq: Once | ORAL | Status: AC
  Administered 2018-03-19: 40 meq via ORAL
  Filled 2018-03-19: qty 2

## 2018-03-19 MED ORDER — PANTOPRAZOLE SODIUM 40 MG PO TBEC: 40.0000 mg | DELAYED_RELEASE_TABLET | Freq: Two times a day (BID) | ORAL | 2 refills | Status: DC

## 2018-03-19 MED ORDER — METOPROLOL TARTRATE 25 MG PO TABS: 25.0000 mg | ORAL_TABLET | Freq: Two times a day (BID) | ORAL | 2 refills | Status: DC

## 2018-03-19 MED ORDER — ONDANSETRON 4 MG PO TBDP: 4.0000 mg | ORAL_TABLET | ORAL | 0 refills | Status: DC | PRN

## 2018-03-19 MED ORDER — MAGNESIUM SULFATE 2 GM/50ML IV SOLN
2.0000 g | Freq: Once | INTRAVENOUS | Status: AC
  Administered 2018-03-19: 2 g via INTRAVENOUS
  Filled 2018-03-19: qty 50

## 2018-03-19 MED ORDER — METOCLOPRAMIDE HCL 5 MG PO TABS: 5.0000 mg | ORAL_TABLET | Freq: Three times a day (TID) | ORAL | 0 refills | Status: DC

## 2018-03-19 MED ORDER — SUCRALFATE 1 GM/10ML PO SUSP: 1.0000 g | Freq: Three times a day (TID) | ORAL | 0 refills | Status: DC

## 2018-03-19 MED ORDER — HYOSCYAMINE SULFATE 0.125 MG SL SUBL: 0.2500 mg | SUBLINGUAL_TABLET | Freq: Three times a day (TID) | SUBLINGUAL | 0 refills | Status: DC

## 2018-03-19 MED FILL — METOPROLOL TARTRATE 25 MG T: 25 | 30 days supply | Qty: 60 | Fill #0

## 2018-03-19 MED FILL — ACETAMINOPHEN 325 MG TABS: 325 | 30 days supply | Qty: 30 | Fill #0

## 2018-03-19 MED FILL — PANTOPRAZOLE SOD DR 40 MG T: 40 | 30 days supply | Qty: 60 | Fill #0

## 2018-03-19 MED FILL — ONDANSETRON HCL 4 MG TABLET: 4 | 20 days supply | Qty: 20 | Fill #0

## 2018-03-19 MED FILL — CARAFATE 1 GM/10 ML SUSP: 1 | 7 days supply | Qty: 420 | Fill #0

## 2018-03-19 NOTE — Discharge Instructions (Signed)
1)Follow-up as outpatient with your gastroenterologist Dr. Lyndel Safe on 03/31/18 at 11:00 am,  2)Avoid ibuprofen/Advil/Aleve/Motrin/Goody Powders/Naproxen/BC powders/Meloxicam/Diclofenac/Indomethacin and other Nonsteroidal anti-inflammatory medications as these will make you more likely to bleed and can cause stomach ulcers, can also cause Kidney problems.  3)Quit Marijuana/Cannibis use

## 2018-03-19 NOTE — Progress Notes (Addendum)
     [facility] Progress Note  CC:  Nausea and vomiting  Subjective:  Feeling much better so far today.  Tolerated small amount of breakfast.  Abdominal pain is better as well.  PPI was increased yesterday, zofran was scheduled to alternate with compazine, and levsin ACHS was added.  Objective:  Vital signs in last 24 hours: Temp:  [98.4 F (36.9 C)-99 F (37.2 C)] 98.4 F (36.9 C) (11/22 0348) Pulse Rate:  [60-79] 79 (11/22 0348) Resp:  [16-20] 16 (11/22 0348) BP: (139-167)/(75-98) 167/98 (11/22 0348) SpO2:  [97 %-100 %] 97 % (11/22 0348) Last BM Date: 03/16/18 General:  Alert, Well-developed, in NAD Heart:  Regular rate and rhythm; no murmurs Pulm:  CTAB.  No increased WOB. Abdomen:  Soft, non-distended.  BS present.  Non-tender. Extremities:  Without edema. Neurologic:  Alert and oriented x 4;  grossly normal neurologically. Psych:  Alert and cooperative. Normal mood and affect.  Intake/Output this shift: Total I/O In: 120 [P.O.:120] Out: -   BMET Recent Labs    03/17/18 1541  NA 135  K 3.0*  CL 95*  CO2 31  GLUCOSE 89  BUN <5*  CREATININE 0.74  CALCIUM 9.3   Assessment / Plan: *Close to 1 month of persistent N/V, abdominal pain. Xray, CT imaging, labs unrevealing. 11/19 EGD: Mild erosive gastro-duodenitis, gastric nodule (benign appearing). Small HH. Biopsy path shows mild reactive gastropathy and mild chronic gastritis.  Daily large consumption of marijuana.  Scheduled po Protonix twice daily (increased to BID on 11/21), low dose Reglan IV (5 mg IV every 6 hours just restarted 11/20), IV compazine, IV Zofran in place.  Same constellation of sxs led to FTT and PEG placement for 9 months in 2017/18.   * Ileal resection for neuroendocrine tumor 2016 at [facility]No evidence of tumor or blockage on xray and CT at [facility].  * Left adrenal nodule, present since 2014. Renin, aldosterone levels drawn at [facility].   * 2016 adenomatous colon polyps. Repeat study due but should be deferred to outpt setting, after resolution of N/V when she can complete bowel prep.   -If she is discharged today then should be discharged on current regimen (pantoprazole 40 mg BID, levsin 0.125 mg ACHS, reglan 5 mg every 6 hours or ACHS, and zofran alternating with compazine.  Again emphasized need to discontinue cannabis use.  Medicines can be tapered/discontinued once she is continuing to do well as an outpatient.  I made her a follow-up appt with Dr. Lyndel Safe on 12/4 at 11:00 am, which is in her discharge paperwork.   LOS: 3 days   Kristi Romero. Kristi Romero  03/19/2018, 9:13 AM      Attending Physician Note   I have taken an interval history, reviewed the chart and examined the patient. I agree with the Advanced Practitioner's note, impression and recommendations. Continues to improve. Should be ok for discharge from GI standpoint today, tomorrow. Outpatient GI medications and GI follow up as outlined above. GI signing off.   Kristi Edward, MD FACG [phone]

## 2018-03-19 NOTE — Discharge Summary (Signed)
Kristi Romero, is a 62 y.o. female  DOB December 18, 1955  MRN [id number].  Admission date:  03/15/2018  Admitting Physician  Shela Leff, MD  Discharge Date:  03/19/2018   Primary MD  Raelyn Number, MD  Recommendations for primary care physician for things to follow:   1)Follow-up as outpatient with your gastroenterologist Dr. Lyndel Safe on 03/31/18 at 11:00 am,  2)Avoid ibuprofen/Advil/Aleve/Motrin/Goody Powders/Naproxen/BC powders/Meloxicam/Diclofenac/Indomethacin and other Nonsteroidal anti-inflammatory medications as these will make you more likely to bleed and can cause stomach ulcers, can also cause Kidney problems.  3)Quit Marijuana/Cannibis use   Admission Diagnosis  vomiting   Discharge Diagnosis  vomiting    Principal Problem:   Intractable nausea and vomiting Active Problems:   Hyperlipidemia   Hypoglycemia   Adrenal adenoma   Hypertension   Anxiety and depression   Abdominal pain, epigastric   Vomiting      Past Medical History:  Diagnosis Date  . Chronic back pain   . Hiatal hernia   . Hypercholesteremia   . Hypertension   . Nausea & vomiting 03/17/2018  . Numbness     Past Surgical History:  Procedure Laterality Date  . BIOPSY  03/16/2018   Procedure: BIOPSY;  Surgeon: Ladene Artist, MD;  Location: [facility] ENDOSCOPY;  Service: Endoscopy;;  . ESOPHAGOGASTRODUODENOSCOPY  02/2018  . ESOPHAGOGASTRODUODENOSCOPY (EGD) WITH PROPOFOL N/A 03/16/2018   Procedure: ESOPHAGOGASTRODUODENOSCOPY (EGD) WITH PROPOFOL;  Surgeon: Ladene Artist, MD;  Location: [facility] ENDOSCOPY;  Service: Endoscopy;  Laterality: N/A;  . PARTIAL HYSTERECTOMY    . TOE SURGERY Bilateral        HPI  from the history and physical done on the day of admission:    Patient coming from: [facility]  Chief Complaint: Intractable nausea and vomiting  HPI: Kristi Romero is a 62 y.o. female with medical history  significant of hypertension, hyperlipidemia, depression, anxiety, history of PEG tube (unspecified reason) with removal in January 2018 who presented to [facility] ED with persistent nausea and vomiting.  Patient reports having nausea and NBNB emesis for the past 3 to 4 months.  States the problem has been getting progressively worse.  States she last vomited 4 days ago and her nausea has improved but she is still not able to tolerate p.o. intake.  Reports having generalized abdominal pain which is worse in the left upper quadrant for the past 2 weeks.  Denies having any diarrhea.  Reports smoking marijuana.  Reports having a feeding tube in the past because she was not able to eat; feeding tube was removed last year.  Reports having an EGD done a year ago.  ED Course at [facility]: Initial vitals showing blood pressure 154/87, pulse 84, respiratory rate 18, temperature 98.7 F, SPO2 97% on room air.  Labs revealed potassium 3.0.  CBC and CMP were otherwise largely unremarkable.  Abdominal x-ray negative.  She received 2 L IV fluid, IV Zofran, and IV Phenergan.  On reevaluation by the ED provider, patient reported worsening pain and was noted to be  diaphoretic and tachycardic with bigeminy on telemetry.  O2 saturations and pulse ox decreased and ABG was obtained which was within normal limits.  UDS was obtained and was positive for marijuana.  CTA chest/thorax/abdomen/pelvis was obtained.  Imaging was negative for acute PE.  A stable subcentimeter right adrenal nodule was seen as well as a 1.8 x 2.1 cm left adrenal nodule which demonstrated slow growth compared to 2014.  There was no bowel obstruction seen and appearance of right lower quadrant anastomosis was unremarkable.  Patient has a history of prior cholecystectomy and hysterectomy.  Hospital course at [facility]: Patient was admitted on March 12, 2018.  She continued to complain of persistent nausea and vomiting with inability to consume  oral intake.  A gastric emptying study was ordered, however, patient was unable to tolerate imaging due to vomiting.  Hospitalist Dr. Cyndia Skeeters at [facility] consulted gastroenterology at [facility] Dr. Michail Sermon and Dr. Lyndel Safe who recommended transfer to [facility] for an EGD.  Patient noted to be hypoglycemic November 17 overnight due to poor p.o. intake and was started on D5 saline drip.  Because of unexplained diaphoresis and tachycardia on admission in the context of a small adrenal lesion pheochromocytoma work-up had been initiated with consideration given to initiating 24-hour urine collection for catecholamines and metanephrines.  There were also concerns regarding hyperaldosteronism so aldosterone level as well as renin activity levels were collected but did not result at the time of discharge.  Over the weekend, a telephone consultation with the patient's primary gastroenterologist Dr. Lyndel Safe was done and he recommended Compazine as needed and Reglan 4 times daily.   Hospital Course:     Brief summary 62 year old transferred from [facility] on 03/16/2018 with persistent nausea and vomiting in the setting of ongoing cannabis use, EGD as noted below,  Plan:- 1) cannabis hyperemesis syndrome- much improved symptomatically , no further emesis tolerating oral intake well , GI service okay to discharge home, outpatient follow-up with Dr. Lyndel Safe advised , 11/19 EGD: Mild erosive gastro-duodenitis, gastric nodule (benign appearing). Small HH. Biopsy pathshows mild reactive gastropathy and mild chronic gastritis.  Abstinence from marijuana strongly advised  2)Ileal resection for neuroendocrine tumor 2016 at [facility]No evidence of tumor or blockage on xray and CT at [facility]---  3)Adrenal adenoma seen on CT imaging at [facility]-  -present since 2014, Renin, aldosterone levels drawn at [facility], A stable subcentimeter right adrenal nodule was seen as well as a 1.8x2.1  cm left adrenal nodule which demonstrated slow growth compared to 2014. There were also concerns regarding hyperaldosteronismdue to tachycardia and diaphoresison admission at [place],aldosterone level as well as renin activity levels were collected but did not result at the time of discharge.  -Radiologist recommended further evaluation with outpatient adrenal MRI -defer further work up to PCP  Discharge Condition: Stable  Follow UP  Follow-up Information    Jackquline Denmark, MD Follow up on 03/31/2018.   Specialties:  Gastroenterology, Internal Medicine Why:  11:00 am Contact information: [place] [id number] [phone]            Consults obtained - Gi  Diet and Activity recommendation:  As advised  Discharge Instructions     Discharge Instructions    Call MD for:  difficulty breathing, headache or visual disturbances   Complete by:  As directed    Call MD for:  persistant dizziness or light-headedness   Complete by:  As directed    Call MD  for:  persistant nausea and vomiting   Complete by:  As directed    Call MD for:  severe uncontrolled pain   Complete by:  As directed    Call MD for:  temperature >100.4   Complete by:  As directed    Diet - low sodium heart healthy   Complete by:  As directed    Discharge instructions   Complete by:  As directed    1)Follow-up as outpatient with your gastroenterologist Dr. Lyndel Safe on 03/31/18 at 11:00 am for evaluation,  2)Avoid ibuprofen/Advil/Aleve/Motrin/Goody Powders/Naproxen/BC powders/Meloxicam/Diclofenac/Indomethacin and other Nonsteroidal anti-inflammatory medications as these will make you more likely to bleed and can cause stomach ulcers, can also cause Kidney problems.  3)Quit Marijuana/Cannibis use   Increase activity slowly   Complete by:  As directed         Discharge Medications     Allergies as of 03/19/2018      Reactions   Codeine Nausea Only   Lisinopril  Swelling      Medication List    STOP taking these medications   ALPRAZolam 0.5 MG tablet Commonly known as:  XANAX     TAKE these medications   acetaminophen 325 MG tablet Commonly known as:  TYLENOL Take 2 tablets (650 mg total) by mouth every 6 (six) hours as needed for mild pain (or Fever >/= 101).   cyclobenzaprine 10 MG tablet Commonly known as:  FLEXERIL Take 10 mg by mouth 3 (three) times daily as needed.   hyoscyamine 0.125 MG SL tablet Commonly known as:  LEVSIN SL Place 2 tablets (0.25 mg total) under the tongue 4 (four) times daily -  before meals and at bedtime.   LINZESS 290 MCG Caps capsule Generic drug:  linaclotide Take 290 mcg by mouth daily as needed.   LYRICA 75 MG capsule Generic drug:  pregabalin Take 75 mg by mouth 2 (two) times daily.   metoCLOPramide 5 MG tablet Commonly known as:  REGLAN Take 1 tablet (5 mg total) by mouth 3 (three) times daily before meals.   metoprolol tartrate 25 MG tablet Commonly known as:  LOPRESSOR Take 1 tablet (25 mg total) by mouth 2 (two) times daily.   ondansetron 4 MG disintegrating tablet Commonly known as:  ZOFRAN-ODT Take 1 tablet (4 mg total) by mouth every other day as needed for nausea or vomiting. What changed:  See the new instructions.   pantoprazole 40 MG tablet Commonly known as:  PROTONIX Take 1 tablet (40 mg total) by mouth 2 (two) times daily before a meal. Start taking on:  03/20/2018   sucralfate 1 GM/10ML suspension Commonly known as:  CARAFATE Take 10 mLs (1 g total) by mouth 4 (four) times daily -  with meals and at bedtime.       Major procedures and Radiology Reports - PLEASE review detailed and final reports for all details, in brief -     Today   Subjective    Finnlee Sroka today has no concerns, eating and drinking well, no emesis, no significant abdominal pain, eager to go home,          Patient has been seen and examined prior to discharge   Objective   Blood  pressure (!) 150/86, pulse 72, temperature 98.3 F (36.8 C), temperature source Oral, resp. rate 17, height 5\' 4"  (1.626 m), weight 83 kg, SpO2 94 %.   Intake/Output Summary (Last 24 hours) at 03/19/2018 1632 Last data filed at 03/19/2018 1300 Gross per 24  hour  Intake 240 ml  Output -  Net 240 ml    Exam Gen:- Awake Alert, no acute distress  HEENT:- [place].AT, No sclera icterus Neck-Supple Neck,No JVD,.  Lungs-  CTAB , good air movement bilaterally  CV- S1, S2 normal, regular Abd-  +ve B.Sounds, Abd Soft, No tenderness,    Extremity/Skin:- No  edema,   good pulses Psych-affect is appropriate, oriented x3 Neuro-no new focal deficits, no tremors    Data Review   CBC w Diff:  Lab Results  Component Value Date   WBC 9.4 03/16/2018   HGB 12.0 03/16/2018   HCT 36.3 03/16/2018   PLT 254 03/16/2018   LYMPHOPCT 24 03/06/2011   MONOPCT 5 03/06/2011   EOSPCT 0 03/06/2011   BASOPCT 0 03/06/2011    CMP:  Lab Results  Component Value Date   NA 136 03/19/2018   K 3.4 (L) 03/19/2018   CL 96 (L) 03/19/2018   CO2 30 03/19/2018   BUN <5 (L) 03/19/2018   CREATININE 0.79 03/19/2018   PROT 6.6 03/16/2018   ALBUMIN 3.3 (L) 03/16/2018   BILITOT 1.0 03/16/2018   ALKPHOS 76 03/16/2018   AST 20 03/16/2018   ALT 16 03/16/2018  .   Total Discharge time is about 33 minutes  Roxan Hockey M.D on 03/19/2018 at 4:32 PM  Pager---[phone]  Go to [email or web address] - password [email or web address] for contact info  [facility] - Office  [phone]

## 2018-03-19 NOTE — Progress Notes (Signed)
Nsg Discharge Note  Admit Date:  03/15/2018 Discharge date: 03/19/2018   Kristi Romero to be D/C'd Home per MD order.  AVS completed.  Copy for chart, and copy for patient signed, and dated. Patient/caregiver able to verbalize understanding.  Discharge Medication: Allergies as of 03/19/2018      Reactions   Codeine Nausea Only   Lisinopril Swelling      Medication List    STOP taking these medications   ALPRAZolam 0.5 MG tablet Commonly known as:  XANAX     TAKE these medications   acetaminophen 325 MG tablet Commonly known as:  TYLENOL Take 2 tablets (650 mg total) by mouth every 6 (six) hours as needed for mild pain (or Fever >/= 101).   cyclobenzaprine 10 MG tablet Commonly known as:  FLEXERIL Take 10 mg by mouth 3 (three) times daily as needed.   hyoscyamine 0.125 MG SL tablet Commonly known as:  LEVSIN SL Place 2 tablets (0.25 mg total) under the tongue 4 (four) times daily -  before meals and at bedtime.   LINZESS 290 MCG Caps capsule Generic drug:  linaclotide Take 290 mcg by mouth daily as needed.   LYRICA 75 MG capsule Generic drug:  pregabalin Take 75 mg by mouth 2 (two) times daily.   metoCLOPramide 5 MG tablet Commonly known as:  REGLAN Take 1 tablet (5 mg total) by mouth 3 (three) times daily before meals.   metoprolol tartrate 25 MG tablet Commonly known as:  LOPRESSOR Take 1 tablet (25 mg total) by mouth 2 (two) times daily.   ondansetron 4 MG disintegrating tablet Commonly known as:  ZOFRAN-ODT Take 1 tablet (4 mg total) by mouth every other day as needed for nausea or vomiting. What changed:  See the new instructions.   pantoprazole 40 MG tablet Commonly known as:  PROTONIX Take 1 tablet (40 mg total) by mouth 2 (two) times daily before a meal. Start taking on:  03/20/2018   sucralfate 1 GM/10ML suspension Commonly known as:  CARAFATE Take 10 mLs (1 g total) by mouth 4 (four) times daily -  with meals and at bedtime.       Discharge  Assessment: Vitals:   03/19/18 0348 03/19/18 1214  BP: (!) 167/98 (!) 150/86  Pulse: 79 72  Resp: 16 17  Temp: 98.4 F (36.9 C) 98.3 F (36.8 C)  SpO2: 97% 94%   Skin clean, dry and intact without evidence of skin break down, no evidence of skin tears noted. IV catheter discontinued intact. Site without signs and symptoms of complications - no redness or edema noted at insertion site, patient denies c/o pain - only slight tenderness at site.  Dressing with slight pressure applied.  D/c Instructions-Education: Discharge instructions given to patient/family with verbalized understanding. D/c education completed with patient/family including follow up instructions, medication list, d/c activities limitations if indicated, with other d/c instructions as indicated by MD - patient able to verbalize understanding, all questions fully answered. Patient instructed to return to ED, call 911, or call MD for any changes in condition.  Patient escorted via [place], and D/C home via private auto.  Eda Keys, RN 03/19/2018 6:37 PM

## 2018-03-31 ENCOUNTER — Encounter: Payer: Self-pay | Admitting: Gastroenterology

## 2018-03-31 ENCOUNTER — Ambulatory Visit: Payer: Medicaid Other | Admitting: Gastroenterology

## 2018-03-31 VITALS — BP 134/72 | HR 68 | Ht 64.0 in

## 2018-03-31 DIAGNOSIS — R1084 Generalized abdominal pain: Secondary | ICD-10-CM | POA: Diagnosis not present

## 2018-03-31 DIAGNOSIS — R112 Nausea with vomiting, unspecified: Secondary | ICD-10-CM

## 2018-03-31 NOTE — Patient Instructions (Signed)
If you are age 62 or older, your body mass index should be between 23-30. Your Body mass index is 31.41 kg/m. If this is out of the aforementioned range listed, please consider follow up with your Primary Care Provider.  If you are age 69 or younger, your body mass index should be between 19-25. Your Body mass index is 31.41 kg/m. If this is out of the aformentioned range listed, please consider follow up with your Primary Care Provider.   You have been scheduled for a CT scan of the abdomen and pelvis at [place] [facility].   You are scheduled on 04/08/18 at [place] should arrive 15 minutes prior to your appointment time for registration. Please follow the written instructions below on the day of your exam:  WARNING: IF YOU ARE ALLERGIC TO IODINE/X-RAY DYE, PLEASE NOTIFY RADIOLOGY IMMEDIATELY AT [phone]! YOU WILL BE GIVEN A 13 HOUR PREMEDICATION PREP.  1) Do not eat or drink anything after 7am (4 hours prior to your test) 2) You have been given 2 bottles of oral contrast to drink. The solution may taste better if refrigerated, but do NOT add ice or any other liquid to this solution. Shake well before drinking.    Drink 1 bottle of contrast @ 9am (2 hours prior to your exam)  Drink 1 bottle of contrast @ 10am (1 hour prior to your exam)  You may take any medications as prescribed with a small amount of water, if necessary. If you take any of the following medications: METFORMIN, GLUCOPHAGE, GLUCOVANCE, AVANDAMET, RIOMET, FORTAMET, [place] MET, JANUMET, GLUMETZA or METAGLIP, you MAY be asked to HOLD this medication 48 hours AFTER the exam.  The purpose of you drinking the oral contrast is to aid in the visualization of your intestinal tract. The contrast solution may cause some diarrhea. Depending on your individual set of symptoms, you may also receive an intravenous injection of x-ray contrast/dye. Plan on being at [facility] for 30 minutes or longer, depending on the type of  exam you are having performed.  This test typically takes 30-45 minutes to complete.  If you have any questions regarding your exam or if you need to reschedule, you may call the CT department at [phone] between the hours of 8:00 am and 5:00 pm, Monday-Friday.    Thank you,  Dr. Jackquline Denmark   ________________________________________________________________________

## 2018-03-31 NOTE — Progress Notes (Signed)
Chief Complaint:   Referring Provider:  Raelyn Number, MD      ASSESSMENT AND PLAN;   #1. Nausea but no vomiting- Neg EGD 03/16/2018 with neg Bx. H/O marijuana abuse. H/O PEG placement for failure to thrive 2017. May have functional component/nonulcer dyspepsia/cannabis hyperemesis syndrome. Has been to multiple gastroenterologists at multiple centers. #2. Generalized abd pain. #3. TI carcinoid s/p laparoscopic right hemicolectomy 2016 #4. 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 -Continue Protonix 40 mg p.o. twice daily for now. -Continue Zofran, Reglan, Carafate for now. -Discontinue THC use. -If above work-up is negative and patient continues to have problems, will consider solid phase GES off reglan. -I have explained above in detail to the patient and patient's friend. -She should follow-up with her family physician for left adrenal nodule. -Follow-up in 12 weeks.  Earlier, if still with problems.  Once the upper GI symptoms are better, would consider colonoscopy.  HPI:    Kristi Romero is a 63 y.o. female  FU from recent hospitalization due to nausea/vomiting/epigastric pain Negative CT scan previously Negative EGD with biopsies 02/2018 She was treated symptomatically Comes back to the GI clinic for follow-up visit Has multiple problems including-nausea with generalized abdominal pain and postprandial abdominal bloating.  She is very much concerned about small bowel obstruction since she had multiple surgeries.  She continues to use marijuana.  The vomiting has resolved currently.  PEG was placed previously due to failure to thrive-patient weighed 108 pounds at that time, after feeds patient had gained weight to 150 pounds.  She was able to take p.o. and wanted PEG tube to be removed.  This was removed on 05/26/2016 at [facility]. Past Medical History:    Diagnosis Date  . Chronic back pain   . Hiatal hernia   . Hypercholesteremia   . Hypertension   . Nausea & vomiting 03/17/2018  . Numbness     Past Surgical History:  Procedure Laterality Date  . BIOPSY  03/16/2018   Procedure: BIOPSY;  Surgeon: Ladene Artist, MD;  Location: [facility] ENDOSCOPY;  Service: Endoscopy;;  . ESOPHAGOGASTRODUODENOSCOPY  02/2018  . ESOPHAGOGASTRODUODENOSCOPY (EGD) WITH PROPOFOL N/A 03/16/2018   Procedure: ESOPHAGOGASTRODUODENOSCOPY (EGD) WITH PROPOFOL;  Surgeon: Ladene Artist, MD;  Location: [facility] ENDOSCOPY;  Service: Endoscopy;  Laterality: N/A;  . PARTIAL HYSTERECTOMY    . TOE SURGERY Bilateral     Family History  Problem Relation Age of Onset  . Lung cancer Father   . Heart disease Father   . Hypertension Father   . Heart disease Mother   . Hypertension Mother     Social History   Tobacco Use  . Smoking status: Former Research scientist (life sciences)  . Smokeless tobacco: Never Used  . Tobacco comment: quit long time ago   Substance Use Topics  . Alcohol use: No    Alcohol/week: 0.0 standard drinks  . Drug use: Yes    Types: Marijuana    Comment: Currently smokes about 7 marijuana "joints" per week.    Current Outpatient Medications  Medication Sig Dispense Refill  . acetaminophen (TYLENOL) 325 MG tablet Take 2 tablets (650 mg total) by mouth every 6 (six) hours as needed for mild pain (or Fever >/= 101). 30 tablet 1  . cyclobenzaprine (FLEXERIL) 10 MG tablet Take 10 mg by mouth 3 (three) times daily as needed.   0  . hyoscyamine (LEVSIN SL) 0.125 MG SL tablet  Place 2 tablets (0.25 mg total) under the tongue 4 (four) times daily -  before meals and at bedtime. 120 tablet 0  . Linaclotide (LINZESS) 290 MCG CAPS capsule Take 290 mcg by mouth daily as needed.     Marland Kitchen LYRICA 75 MG capsule Take 75 mg by mouth 2 (two) times daily.   2  . metoCLOPramide (REGLAN) 5 MG tablet Take 1 tablet (5 mg total) by mouth 3 (three) times daily before meals. 90 tablet 0  . metoprolol  tartrate (LOPRESSOR) 25 MG tablet Take 1 tablet (25 mg total) by mouth 2 (two) times daily. 60 tablet 2  . ondansetron (ZOFRAN ODT) 4 MG disintegrating tablet Take 1 tablet (4 mg total) by mouth every other day as needed for nausea or vomiting. 20 tablet 0  . pantoprazole (PROTONIX) 40 MG tablet Take 1 tablet (40 mg total) by mouth 2 (two) times daily before a meal. 60 tablet 2  . sucralfate (CARAFATE) 1 GM/10ML suspension Take 10 mLs (1 g total) by mouth 4 (four) times daily -  with meals and at bedtime. 420 mL 0   No current facility-administered medications for this visit.     Allergies  Allergen Reactions  . Codeine Nausea Only  . Lisinopril Swelling    Review of Systems:  Constitutional: Denies fever, chills, diaphoresis, appetite change and fatigue.  HEENT: Denies photophobia, eye pain, redness, hearing loss, ear pain, congestion, sore throat, rhinorrhea, sneezing, mouth sores, neck pain, neck stiffness and tinnitus.   Respiratory: Denies SOB, DOE, cough, chest tightness,  and wheezing.   Cardiovascular: Denies chest pain, palpitations and leg swelling.  Genitourinary: Denies dysuria, urgency, frequency, hematuria, flank pain and difficulty urinating.  Musculoskeletal: has myalgias, back pain, joint swelling, arthralgias and gait problem.  Skin: No rash.  Neurological: Patient on wheelchair, has some neurologic disorder-being followed by neurology. Hematological: Denies adenopathy. Easy bruising, personal or family bleeding history  Psychiatric/Behavioral: Has significant anxiety or depression     Physical Exam:    BP 134/72   Pulse 68   Ht 5\' 4"  (1.626 m)   BMI 31.41 kg/m  There were no vitals filed for this visit. Constitutional:  Well-developed, in no acute distress. On wheelchair Psychiatric: Normal mood and affect. Behavior is normal. HEENT: Pupils normal.  Conjunctivae are normal. No scleral icterus. Neck supple.  Cardiovascular: Normal rate, regular rhythm. No  edema Pulmonary/chest: Effort normal and breath sounds normal. No wheezing, rales or rhonchi. Abdominal: Soft, nondistended. Nontender. Bowel sounds active throughout. There are no masses palpable. No hepatomegaly.  Multiple well-healed surgical scars. Rectal:  defered Neurological: Alert and oriented to person place and time. Skin: Skin is warm and dry. No rashes noted.  Data Reviewed: I have personally reviewed following labs and imaging studies  CBC: CBC Latest Ref Rng & Units 03/16/2018 12/16/2014 03/06/2011  WBC 4.0 - 10.5 K/uL 9.4 8.6 10.9(H)  Hemoglobin 12.0 - 15.0 g/dL 12.0 15.0 11.8(L)  Hematocrit 36.0 - 46.0 % 36.3 42.7 34.8(L)  Platelets 150 - 400 K/uL 254 236 PLATELET CLUMPS NOTED ON SMEAR, COUNT APPEARS ADEQUATE    CMP: CMP Latest Ref Rng & Units 03/19/2018 03/17/2018 03/16/2018  Glucose 70 - 99 mg/dL 95 89 101(H)  BUN 8 - 23 mg/dL <5(L) <5(L) <5(L)  Creatinine 0.44 - 1.00 mg/dL 0.79 0.74 0.66  Sodium 135 - 145 mmol/L 136 135 137  Potassium 3.5 - 5.1 mmol/L 3.4(L) 3.0(L) 3.2(L)  Chloride 98 - 111 mmol/L 96(L) 95(L) 100  CO2 22 -  32 mmol/L 30 31 28   Calcium 8.9 - 10.3 mg/dL 9.3 9.3 9.0  Total Protein 6.5 - 8.1 g/dL - - 6.6  Total Bilirubin 0.3 - 1.2 mg/dL - - 1.0  Alkaline Phos 38 - 126 U/L - - 76  AST 15 - 41 U/L - - 20  ALT 0 - 44 U/L - - 16  25 minutes spent with the patient today. Greater than 50% was spent in counseling and coordination of care with the patient    Carmell Austria, MD 03/31/2018, 11:51 AM  Cc: Raelyn Number, MD

## 2018-04-08 ENCOUNTER — Ambulatory Visit (HOSPITAL_BASED_OUTPATIENT_CLINIC_OR_DEPARTMENT_OTHER)
Admission: RE | Admit: 2018-04-08 | Discharge: 2018-04-08 | Disposition: A | Discharge status: Home / Self Care | Payer: Medicaid Other | Source: Ambulatory Visit | Attending: Gastroenterology | Admitting: Gastroenterology

## 2018-04-08 ENCOUNTER — Encounter (HOSPITAL_BASED_OUTPATIENT_CLINIC_OR_DEPARTMENT_OTHER): Payer: Self-pay

## 2018-04-08 DIAGNOSIS — R112 Nausea with vomiting, unspecified: Secondary | ICD-10-CM | POA: Insufficient documentation

## 2018-04-08 DIAGNOSIS — R1084 Generalized abdominal pain: Secondary | ICD-10-CM | POA: Diagnosis present

## 2018-04-08 MED ORDER — IOPAMIDOL (ISOVUE-300) INJECTION 61%
100.0000 mL | Freq: Once | INTRAVENOUS | Status: AC | PRN
  Administered 2018-04-08: 100 mL via INTRAVENOUS

## 2018-05-11 ENCOUNTER — Encounter: Payer: Self-pay | Admitting: Gastroenterology

## 2018-05-11 ENCOUNTER — Ambulatory Visit (INDEPENDENT_AMBULATORY_CARE_PROVIDER_SITE_OTHER): Payer: Medicaid Other | Admitting: Gastroenterology

## 2018-05-11 ENCOUNTER — Other Ambulatory Visit (INDEPENDENT_AMBULATORY_CARE_PROVIDER_SITE_OTHER): Payer: Medicaid Other

## 2018-05-11 VITALS — BP 110/72 | HR 47 | Ht 64.0 in | Wt 169.0 lb

## 2018-05-11 DIAGNOSIS — R112 Nausea with vomiting, unspecified: Secondary | ICD-10-CM

## 2018-05-11 DIAGNOSIS — R1084 Generalized abdominal pain: Secondary | ICD-10-CM | POA: Diagnosis not present

## 2018-05-11 LAB — CBC WITH DIFFERENTIAL/PLATELET
BASOS PCT: 0.1 % (ref 0.0–3.0)
Basophils Absolute: 0 10*3/uL (ref 0.0–0.1)
EOS PCT: 0.4 % (ref 0.0–5.0)
Eosinophils Absolute: 0 10*3/uL (ref 0.0–0.7)
HCT: 38.7 % (ref 36.0–46.0)
Hemoglobin: 12.9 g/dL (ref 12.0–15.0)
LYMPHS ABS: 4 10*3/uL (ref 0.7–4.0)
LYMPHS PCT: 50.2 % — AB (ref 12.0–46.0)
MCHC: 33.2 g/dL (ref 30.0–36.0)
MCV: 95.7 fl (ref 78.0–100.0)
MONO ABS: 0.6 10*3/uL (ref 0.1–1.0)
Monocytes Relative: 7.9 % (ref 3.0–12.0)
NEUTROS PCT: 41.4 % — AB (ref 43.0–77.0)
Neutro Abs: 3.3 10*3/uL (ref 1.4–7.7)
Platelets: 311 10*3/uL (ref 150.0–400.0)
RBC: 4.05 Mil/uL (ref 3.87–5.11)
RDW: 14.6 % (ref 11.5–15.5)
WBC: 8 10*3/uL (ref 4.0–10.5)

## 2018-05-11 LAB — COMPREHENSIVE METABOLIC PANEL
ALBUMIN: 3.9 g/dL (ref 3.5–5.2)
ALT: 9 U/L (ref 0–35)
AST: 14 U/L (ref 0–37)
Alkaline Phosphatase: 89 U/L (ref 39–117)
BUN: 10 mg/dL (ref 6–23)
CALCIUM: 10.1 mg/dL (ref 8.4–10.5)
CHLORIDE: 101 meq/L (ref 96–112)
CO2: 31 mEq/L (ref 19–32)
Creatinine, Ser: 0.65 mg/dL (ref 0.40–1.20)
GFR: 118.38 mL/min (ref >60.00)
Glucose, Bld: 73 mg/dL (ref 70–99)
Sodium: 138 mEq/L (ref 135–145)
Total Bilirubin: 0.3 mg/dL (ref 0.2–1.2)
Total Protein: 7.5 g/dL (ref 6.0–8.3)

## 2018-05-11 LAB — LIPASE: Lipase: 49 U/L (ref 11.0–59.0)

## 2018-05-11 LAB — MAGNESIUM: Magnesium: 2 mg/dL (ref 1.5–2.5)

## 2018-05-11 LAB — C-REACTIVE PROTEIN: CRP: 0.8 mg/dL (ref 0.5–20.0)

## 2018-05-11 NOTE — Patient Instructions (Signed)
If you are age 63 or older, your body mass index should be between 23-30. Your Body mass index is 29.01 kg/m. If this is out of the aforementioned range listed, please consider follow up with your Primary Care Provider.  If you are age 1 or younger, your body mass index should be between 19-25. Your Body mass index is 29.01 kg/m. If this is out of the aformentioned range listed, please consider follow up with your Primary Care Provider.   Please go to the lab on the 2nd floor [address] before you leave the office today.   Thank you,  Dr. Jackquline Denmark

## 2018-05-11 NOTE — Progress Notes (Signed)
Chief Complaint:   Referring Provider:  Janine Limbo, PA-C      ASSESSMENT AND PLAN;   #1. Nausea but no vomiting- Neg EGD 03/16/2018 with neg Bx. H/O marijuana abuse. H/O PEG placement for failure to thrive 2017. May have functional component/nonulcer dyspepsia/cannabis hyperemesis syndrome. Has been to multiple gastroenterologists at multiple centers. #2. Generalized abd pain with abdominal wall tenderness. Neg CT abdo/pelvis 04/08/2018 for any etiology. Has H/O IBS with constipation. Neg EGD and colon in the past #3. TI carcinoid s/p laparoscopic right hemicolectomy 2016 #4. 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 Left Adrenal adenoma (CTA 04/08/2018). Rpt adrenal protocol CT abd in June 2020  Plan: -CT adrenal protocol June 2020 as recommended by radiology. -Check CBC, CMP, lipase, CRP and Mg today. -Continue Protonix 40 mg p.o. twice daily for now. -Continue Zofran, Reglan, Carafate prn for now. -Discontinue THC use. -Ensure 2-3/day. -Linzess 241mcg PO MWF. -If above work-up is negative and she has upper GI symptoms, proceed with solid phase GES off reglan. -I have explained above in detail to the patient and patient's friend. -FU in July 2020 after CT.  Earlier, if still with problems.  Once UGI symptoms are better, would consider colonoscopy.  HPI:    Kristi Romero is a 63 y.o. female  For follow-up visit Negative CT scan Abdo/pelvis December 2019.  It did show stable adrenal adenoma.  Radiology recommended to get repeat CT adrenal protocol in 6 months. Has some nausea but no further vomiting. Generalized abdominal pain-which is not new. Constipation is better on PRN Linzess. FU from recent hospitalization due to nausea/vomiting/epigastric pain Negative CT scan previously Negative EGD with biopsies 02/2018 She continues to use marijuana.  On wheelchair.  Accompanied by her friend.  Ensure is working  well but wants a prescription  PEG was placed previously due to failure to thrive-patient weighed 108 pounds at that time, after feeds patient had gained weight to 150 pounds.  She was able to take p.o. and wanted PEG tube to be removed.  This was removed on 05/26/2016 at [facility]. Past Medical History:  Diagnosis Date  . Chronic back pain   . Hiatal hernia   . Hypercholesteremia   . Hypertension   . Nausea & vomiting 03/17/2018  . Numbness     Past Surgical History:  Procedure Laterality Date  . BIOPSY  03/16/2018   Procedure: BIOPSY;  Surgeon: Ladene Artist, MD;  Location: [facility] ENDOSCOPY;  Service: Endoscopy;;  . COLONOSCOPY  2018   said they removed a mass. Dr Harl Favor Glidden   . ESOPHAGOGASTRODUODENOSCOPY  02/2018  . ESOPHAGOGASTRODUODENOSCOPY (EGD) WITH PROPOFOL N/A 03/16/2018   Procedure: ESOPHAGOGASTRODUODENOSCOPY (EGD) WITH PROPOFOL;  Surgeon: Ladene Artist, MD;  Location: [facility] ENDOSCOPY;  Service: Endoscopy;  Laterality: N/A;  . PARTIAL HYSTERECTOMY    . TOE SURGERY Bilateral     Family History  Problem Relation Age of Onset  . Lung cancer Father   . Heart disease Father   . Hypertension Father   . Heart disease Mother   . Hypertension Mother   . Cancer Mother     Social History   Tobacco Use  . Smoking status: Former Research scientist (life sciences)  . Smokeless tobacco: Never Used  . Tobacco comment: quit long time ago   Substance Use Topics  . Alcohol use: No    Alcohol/week: 0.0 standard drinks  . Drug use: Yes    Types: Marijuana    Comment:  Currently smokes about 7 marijuana "joints" per week.    Current Outpatient Medications  Medication Sig Dispense Refill  . acetaminophen (TYLENOL) 325 MG tablet Take 2 tablets (650 mg total) by mouth every 6 (six) hours as needed for mild pain (or Fever >/= 101). 30 tablet 1  . atorvastatin (LIPITOR) 40 MG tablet Take 40 mg by mouth daily.    . hyoscyamine (LEVSIN SL) 0.125 MG SL tablet Place 2 tablets (0.25 mg total) under the tongue 4  (four) times daily -  before meals and at bedtime. 120 tablet 0  . Linaclotide (LINZESS) 290 MCG CAPS capsule Take 290 mcg by mouth daily as needed.     . meclizine (ANTIVERT) 25 MG tablet Take 25 mg by mouth every 8 (eight) hours as needed for dizziness.    . metoCLOPramide (REGLAN) 5 MG tablet Take 1 tablet (5 mg total) by mouth 3 (three) times daily before meals. 90 tablet 0  . metoprolol tartrate (LOPRESSOR) 25 MG tablet Take 1 tablet (25 mg total) by mouth 2 (two) times daily. 60 tablet 2  . ondansetron (ZOFRAN ODT) 4 MG disintegrating tablet Take 1 tablet (4 mg total) by mouth every other day as needed for nausea or vomiting. 20 tablet 0  . pantoprazole (PROTONIX) 40 MG tablet Take 1 tablet (40 mg total) by mouth 2 (two) times daily before a meal. 60 tablet 2   No current facility-administered medications for this visit.     Allergies  Allergen Reactions  . Codeine Nausea Only  . Lisinopril Swelling    Review of Systems:  neg     Physical Exam:    BP 110/72   Pulse (!) 47   Ht 5\' 4"  (1.626 m)   Wt 169 lb (76.7 kg)   LMP  (LMP Unknown)   BMI 29.01 kg/m  Filed Weights   05/11/18 0948  Weight: 169 lb (76.7 kg)   Constitutional:  Well-developed, in no acute distress. On wheelchair Psychiatric: Normal mood and affect. Behavior is normal. HEENT: Pupils normal.  Conjunctivae are normal. No scleral icterus. Neck supple.  Cardiovascular: Normal rate, regular rhythm. No edema Pulmonary/chest: Effort normal and breath sounds normal. No wheezing, rales or rhonchi. Abdominal: Soft, nondistended. Nontender. Bowel sounds active throughout. There are no masses palpable. No hepatomegaly.  Multiple well-healed surgical scars. Rectal:  defered Neurological: Alert and oriented to person place and time. Skin: Skin is warm and dry. No rashes noted.  Data Reviewed: I have personally reviewed following labs and imaging studies  CBC: CBC Latest Ref Rng & Units 03/16/2018 12/16/2014  03/06/2011  WBC 4.0 - 10.5 K/uL 9.4 8.6 10.9(H)  Hemoglobin 12.0 - 15.0 g/dL 12.0 15.0 11.8(L)  Hematocrit 36.0 - 46.0 % 36.3 42.7 34.8(L)  Platelets 150 - 400 K/uL 254 236 PLATELET CLUMPS NOTED ON SMEAR, COUNT APPEARS ADEQUATE    CMP: CMP Latest Ref Rng & Units 03/19/2018 03/17/2018 03/16/2018  Glucose 70 - 99 mg/dL 95 89 101(H)  BUN 8 - 23 mg/dL <5(L) <5(L) <5(L)  Creatinine 0.44 - 1.00 mg/dL 0.79 0.74 0.66  Sodium 135 - 145 mmol/L 136 135 137  Potassium 3.5 - 5.1 mmol/L 3.4(L) 3.0(L) 3.2(L)  Chloride 98 - 111 mmol/L 96(L) 95(L) 100  CO2 22 - 32 mmol/L 30 31 28   Calcium 8.9 - 10.3 mg/dL 9.3 9.3 9.0  Total Protein 6.5 - 8.1 g/dL - - 6.6  Total Bilirubin 0.3 - 1.2 mg/dL - - 1.0  Alkaline Phos 38 - 126 U/L - -  76  AST 15 - 41 U/L - - 20  ALT 0 - 44 U/L - - 16   CT abdo/pelvis 12/12/2019IMPRESSION: No acute findings.  Mild hepatic steatosis.  2.1 cm indeterminate left adrenal mass. In patient without cancer history, follow-up with adrenal protocol abdomen CT without and with contrast is recommended in 6 months.  CT reviewed independently Discussed with the patient and patient's friend.  25 minutes spent.   Carmell Austria, MD 05/11/2018, 10:05 AM  Cc: Janine Limbo, PA-C

## 2019-02-15 ENCOUNTER — Ambulatory Visit: Payer: Medicaid Other | Admitting: Gastroenterology

## 2019-02-17 ENCOUNTER — Encounter: Payer: Self-pay | Admitting: Gastroenterology

## 2019-02-17 ENCOUNTER — Other Ambulatory Visit: Payer: Self-pay

## 2019-02-17 ENCOUNTER — Ambulatory Visit (INDEPENDENT_AMBULATORY_CARE_PROVIDER_SITE_OTHER): Payer: Medicaid Other | Admitting: Gastroenterology

## 2019-02-17 VITALS — BP 126/88 | HR 71 | Temp 98.3°F | Ht 64.0 in | Wt 186.0 lb

## 2019-02-17 DIAGNOSIS — R1084 Generalized abdominal pain: Secondary | ICD-10-CM

## 2019-02-17 DIAGNOSIS — D3502 Benign neoplasm of left adrenal gland: Secondary | ICD-10-CM

## 2019-02-17 DIAGNOSIS — R110 Nausea: Secondary | ICD-10-CM

## 2019-02-17 MED ORDER — ONDANSETRON 4 MG PO TBDP: 4.0000 mg | ORAL_TABLET | Freq: Four times a day (QID) | ORAL | 2 refills | Status: DC | PRN

## 2019-02-17 NOTE — Progress Notes (Signed)
Chief Complaint:   Referring Provider:  Janine Limbo, PA-C      ASSESSMENT AND PLAN;   #1. Nausea but no vomiting- Neg EGD 03/16/2018 with neg Bx. H/O marijuana abuse. H/O PEG placement for failure to thrive 2017. May have functional component/nonulcer dyspepsia/cannabis hyperemesis syndrome. Has been to multiple gastroenterologists at multiple centers. #2. Generalized abd pain with abdominal wall tenderness. Neg CT abdo/pelvis 04/08/2018 for any etiology. Has H/O IBS with constipation. Neg EGD and colon in the past #3. TI carcinoid s/p laparoscopic right hemicolectomy 2016 #4. 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 Left Adrenal adenoma (CTA 04/08/2018). Rpt adrenal protocol CT abd in June 2020  Plan: -Update med list -CT adrenal protocol as recommended by radiology. -Check CBC, CMP, lipase,  and Mg today. -Continue omeprazole 20mg  po qd -Stop Reglan, Carafate prn for now. -Can use zofran PRN Q6hrs prn #30, 2 refills -Discontinue THC use. -Ensure 1/day, then stop in 4 weeks. -Linzess 239mcg PO MWF. -If above work-up is negative and she has upper GI symptoms, proceed with solid phase GES off  -Call in 4 weeks. -I have explained above in detail to the patient and patient's friend. -FU in 6 mts.  Earlier, if still with problems.  Once UGI symptoms are better, would consider colonoscopy.  HPI:    Kristi Romero is a 63 y.o. female  For follow-up visit Nausea but better.  No vomiting. RUQ pain -chronic, more or less constant, sometimes increases after eating.  Has gain gained weight from 169lb Jan 2020 to 186lb today Negative CT scan Abdo/pelvis December 2019.  It did show stable adrenal adenoma.  Radiology recommended to get repeat CT adrenal protocol in 6 months. Constipation is better on PRN Linzess. Negative EGD with biopsies 02/2018 She continues to use marijuana.  On wheelchair.  Accompanied by her  friend.  In 2017, PEG was placed d/t failure to thrive-patient weighed 108lb at that time, after feeds patient had gained weight to 150lb, Removed 05/26/2016 at [facility] Past Medical History:  Diagnosis Date  . Chronic back pain   . Hiatal hernia   . Hypercholesteremia   . Hypertension   . Nausea & vomiting 03/17/2018  . Numbness     Past Surgical History:  Procedure Laterality Date  . BIOPSY  03/16/2018   Procedure: BIOPSY;  Surgeon: Ladene Artist, MD;  Location: [facility] ENDOSCOPY;  Service: Endoscopy;;  . COLONOSCOPY  2018   said they removed a mass. Dr Harl Favor    . ESOPHAGOGASTRODUODENOSCOPY  02/2018  . ESOPHAGOGASTRODUODENOSCOPY (EGD) WITH PROPOFOL N/A 03/16/2018   Procedure: ESOPHAGOGASTRODUODENOSCOPY (EGD) WITH PROPOFOL;  Surgeon: Ladene Artist, MD;  Location: [facility] ENDOSCOPY;  Service: Endoscopy;  Laterality: N/A;  . PARTIAL HYSTERECTOMY    . TOE SURGERY Bilateral    Outpatient Encounter Medications as of 02/17/2019  Medication Sig  . acetaminophen (TYLENOL) 325 MG tablet Take 2 tablets (650 mg total) by mouth every 6 (six) hours as needed for mild pain (or Fever >/= 101).  . DULoxetine (CYMBALTA) 60 MG capsule Take 60 mg by mouth daily.  . hyoscyamine (LEVSIN SL) 0.125 MG SL tablet Place 2 tablets (0.25 mg total) under the tongue 4 (four) times daily -  before meals and at bedtime.  . Linaclotide (LINZESS) 290 MCG CAPS capsule Take 290 mcg by mouth daily as needed.   . meclizine (ANTIVERT) 25 MG tablet Take 25 mg by mouth every 8 (eight) hours as needed for  dizziness.  . metoCLOPramide (REGLAN) 5 MG tablet Take 1 tablet (5 mg total) by mouth 3 (three) times daily before meals.  . metoprolol tartrate (LOPRESSOR) 25 MG tablet Take 1 tablet (25 mg total) by mouth 2 (two) times daily.  . pantoprazole (PROTONIX) 40 MG tablet Take 1 tablet (40 mg total) by mouth 2 (two) times daily before a meal.  . atorvastatin (LIPITOR) 40 MG tablet Take 40 mg by mouth daily.  .    .  ondansetron (ZOFRAN ODT) 4 MG disintegrating tablet Take 1 tablet (4 mg total) by mouth every 6 (six) hours as needed for nausea or vomiting.   No facility-administered encounter medications on file as of 02/17/2019.     Family History  Problem Relation Age of Onset  . Lung cancer Father   . Heart disease Father   . Hypertension Father   . Heart disease Mother   . Hypertension Mother   . Cancer Mother     Social History   Tobacco Use  . Smoking status: Former Research scientist (life sciences)  . Smokeless tobacco: Never Used  . Tobacco comment: quit long time ago   Substance Use Topics  . Alcohol use: No    Alcohol/week: 0.0 standard drinks  . Drug use: Yes    Types: Marijuana    Comment: Currently smokes about 7 marijuana "joints" per week.    Allergies  Allergen Reactions  . Codeine Nausea Only  . Lisinopril Swelling    Review of Systems:  neg     Physical Exam:    BP 126/88   Pulse 71   Temp 98.3 F (36.8 C)   Ht 5\' 4"  (1.626 m)   Wt 186 lb (84.4 kg)   LMP  (LMP Unknown)   SpO2 97%   BMI 31.93 kg/m  Filed Weights   02/17/19 0926  Weight: 186 lb (84.4 kg)   Constitutional:  Well-developed, in no acute distress. On wheelchair Psychiatric: Normal mood and affect. Behavior is normal. HEENT: Pupils normal.  Conjunctivae are normal. No scleral icterus. Neck supple.  Cardiovascular: Normal rate, regular rhythm. No edema Pulmonary/chest: Effort normal and breath sounds normal. No wheezing, rales or rhonchi. Abdominal: Soft, nondistended. Nontender. Bowel sounds active throughout. There are no masses palpable. No hepatomegaly.  Multiple well-healed surgical scars. Rectal:  defered Neurological: Alert and oriented to person place and time. Skin: Skin is warm and dry. No rashes noted.  Data Reviewed: I have personally reviewed following labs and imaging studies  CBC: CBC Latest Ref Rng & Units 05/11/2018 03/16/2018 12/16/2014  WBC 4.0 - 10.5 K/uL 8.0 9.4 8.6  Hemoglobin 12.0 - 15.0  g/dL 12.9 12.0 15.0  Hematocrit 36.0 - 46.0 % 38.7 36.3 42.7  Platelets 150.0 - 400.0 K/uL 311.0 254 236    CMP: CMP Latest Ref Rng & Units 05/11/2018 03/19/2018 03/17/2018  Glucose 70 - 99 mg/dL 73 95 89  BUN 6 - 23 mg/dL 10 <5(L) <5(L)  Creatinine 0.40 - 1.20 mg/dL 0.65 0.79 0.74  Sodium 135 - 145 mEq/L 138 136 135  Potassium 3.5 - 5.1 mEq/L 6.0 No hemolysis noted(H) 3.4(L) 3.0(L)  Chloride 96 - 112 mEq/L 101 96(L) 95(L)  CO2 19 - 32 mEq/L 31 30 31   Calcium 8.4 - 10.5 mg/dL 10.1 9.3 9.3  Total Protein 6.0 - 8.3 g/dL 7.5 - -  Total Bilirubin 0.2 - 1.2 mg/dL 0.3 - -  Alkaline Phos 39 - 117 U/L 89 - -  AST 0 - 37 U/L 14 - -  ALT 0 - 35 U/L 9 - -   CT abdo/pelvis 04/08/2018: No acute findings.  Mild hepatic steatosis.  2.1 cm indeterminate left adrenal mass. In patient without cancer history, follow-up with adrenal protocol abdomen CT without and with contrast is recommended in 6 months.  CT reviewed independently   Discussed with the patient and patient's friend.  25 minutes spent.   Carmell Austria, MD 02/17/2019, 9:34 AM  Cc: Janine Limbo, PA-C

## 2019-02-17 NOTE — Patient Instructions (Addendum)
If you are age 63 or older, your body mass index should be between 23-30. Your Body mass index is 31.93 kg/m. If this is out of the aforementioned range listed, please consider follow up with your Primary Care Provider.  If you are age 61 or younger, your body mass index should be between 19-25. Your Body mass index is 31.93 kg/m. If this is out of the aformentioned range listed, please consider follow up with your Primary Care Provider.   We have sent the following medications to your pharmacy for you to pick up at your convenience: Zofran  Continue Omeprazole  You [facility] [place] [address] [address]   You are scheduled on 02/25/19 at [place] should arrive 15 minutes prior to your appointment time for registration. Please follow the written instructions below on the day of your exam:  WARNING: IF YOU ARE ALLERGIC TO IODINE/X-RAY DYE, PLEASE NOTIFY RADIOLOGY IMMEDIATELY AT [phone]! YOU WILL BE GIVEN A 13 HOUR PREMEDICATION PREP.  1) Do not eat or drink anything after 6am (4 hours prior to your test) 2) You have been given 2 bottles of oral contrast to drink. The solution may taste better if refrigerated, but do NOT add ice or any other liquid to this solution. Shake well before drinking.    Drink 1 bottle of contrast @ 8am (2 hours prior to your exam)  Drink 1 bottle of contrast @ 9am (1 hour prior to your exam)  You may take any medications as prescribed with a small amount of water, if necessary. If you take any of the following medications: METFORMIN, GLUCOPHAGE, GLUCOVANCE, AVANDAMET, RIOMET, FORTAMET, [place] MET, JANUMET, GLUMETZA or METAGLIP, you MAY be asked to HOLD this medication 48 hours AFTER the exam.  The purpose of you drinking the oral contrast is to aid in the visualization of your intestinal tract. The contrast solution may cause some diarrhea.  Depending on your individual set of symptoms, you may also receive an intravenous injection of x-ray contrast/dye. Plan on being at [facility] for 30 minutes or longer, depending on the type of exam you are having performed.  This test typically takes 30-45 minutes to complete.  If you have any questions regarding your exam or if you need to reschedule, you may call the CT department at [phone] between the hours of 8:00 am and 5:00 pm, Monday-Friday.  ________________________________________________________________________  Please go to the lab at [facility] ([place] You will need to go to level "B", you do not need an appointment for this. Hours available are 7:30 am - 4:30 pm. You will have to have your labs drawn before the day of your Ct Scan or your CT Scan will be canceled.    Ensure once daily then stop in 4 weeks.   Thank you,  Dr. Jackquline Denmark

## 2019-02-23 ENCOUNTER — Other Ambulatory Visit (INDEPENDENT_AMBULATORY_CARE_PROVIDER_SITE_OTHER): Payer: Medicaid Other

## 2019-02-23 DIAGNOSIS — R1084 Generalized abdominal pain: Secondary | ICD-10-CM

## 2019-02-23 DIAGNOSIS — R110 Nausea: Secondary | ICD-10-CM | POA: Diagnosis not present

## 2019-02-23 DIAGNOSIS — D3502 Benign neoplasm of left adrenal gland: Secondary | ICD-10-CM

## 2019-02-23 LAB — CBC WITH DIFFERENTIAL/PLATELET
Basophils Absolute: 0.1 10*3/uL (ref 0.0–0.1)
Basophils Relative: 1.3 % (ref 0.0–3.0)
Eosinophils Absolute: 0.1 10*3/uL (ref 0.0–0.7)
Eosinophils Relative: 0.9 % (ref 0.0–5.0)
HCT: 38.1 % (ref 36.0–46.0)
Hemoglobin: 12.6 g/dL (ref 12.0–15.0)
Lymphocytes Relative: 51.9 % — ABNORMAL HIGH (ref 12.0–46.0)
Lymphs Abs: 3.7 10*3/uL (ref 0.7–4.0)
MCHC: 33.2 g/dL (ref 30.0–36.0)
MCV: 94 fl (ref 78.0–100.0)
Monocytes Absolute: 0.7 10*3/uL (ref 0.1–1.0)
Monocytes Relative: 9.3 % (ref 3.0–12.0)
Neutro Abs: 2.6 10*3/uL (ref 1.4–7.7)
Neutrophils Relative %: 36.6 % — ABNORMAL LOW (ref 43.0–77.0)
Platelets: 271 10*3/uL (ref 150.0–400.0)
RBC: 4.06 Mil/uL (ref 3.87–5.11)
RDW: 13.3 % (ref 11.5–15.5)
WBC: 7.1 10*3/uL (ref 4.0–10.5)

## 2019-02-23 LAB — COMPREHENSIVE METABOLIC PANEL
ALT: 9 U/L (ref 0–35)
AST: 12 U/L (ref 0–37)
Albumin: 3.9 g/dL (ref 3.5–5.2)
Alkaline Phosphatase: 84 U/L (ref 39–117)
BUN: 8 mg/dL (ref 6–23)
CO2: 32 mEq/L (ref 19–32)
Calcium: 9.2 mg/dL (ref 8.4–10.5)
Chloride: 105 mEq/L (ref 96–112)
Creatinine, Ser: 0.74 mg/dL (ref 0.40–1.20)
GFR: 95.66 mL/min (ref >60.00)
Glucose, Bld: 70 mg/dL (ref 70–99)
Potassium: 4.1 mEq/L (ref 3.5–5.1)
Sodium: 140 mEq/L (ref 135–145)
Total Bilirubin: 0.3 mg/dL (ref 0.2–1.2)
Total Protein: 7.4 g/dL (ref 6.0–8.3)

## 2019-02-23 LAB — MAGNESIUM: Magnesium: 2 mg/dL (ref 1.5–2.5)

## 2019-02-23 LAB — LIPASE: Lipase: 49 U/L (ref 11.0–59.0)

## 2019-02-25 ENCOUNTER — Other Ambulatory Visit: Payer: Self-pay

## 2019-02-25 ENCOUNTER — Ambulatory Visit (HOSPITAL_BASED_OUTPATIENT_CLINIC_OR_DEPARTMENT_OTHER)
Admission: RE | Admit: 2019-02-25 | Discharge: 2019-02-25 | Disposition: A | Discharge status: Home / Self Care | Payer: Medicaid Other | Source: Ambulatory Visit | Attending: Gastroenterology | Admitting: Gastroenterology

## 2019-02-25 ENCOUNTER — Encounter (HOSPITAL_BASED_OUTPATIENT_CLINIC_OR_DEPARTMENT_OTHER): Payer: Self-pay

## 2019-02-25 DIAGNOSIS — R1084 Generalized abdominal pain: Secondary | ICD-10-CM

## 2019-02-25 DIAGNOSIS — D3502 Benign neoplasm of left adrenal gland: Secondary | ICD-10-CM | POA: Diagnosis present

## 2019-02-25 MED ORDER — IOHEXOL 300 MG/ML  SOLN
100.0000 mL | Freq: Once | INTRAMUSCULAR | Status: AC | PRN
  Administered 2019-02-25: 100 mL via INTRAVENOUS

## 2019-06-28 IMAGING — CT CT ABD-PELV W/ CM
2 of 5 series · 16 of 46 positions shown, 18 images · IV contrast (APPLIED)
Comparison: None.

CLINICAL DATA: Left-sided abdominal pain. Nausea and vomiting.
Diarrhea.

EXAM:
CT ABDOMEN AND PELVIS WITH CONTRAST
TECHNIQUE: Multidetector CT imaging of the abdomen and pelvis was performed
using the standard protocol following bolus administration of
intravenous contrast.
CONTRAST:  100mL 1Z824X-AFF IOPAMIDOL (1Z824X-AFF) INJECTION 61%

[Series 2: axial st · axial · 0.88mm/px · z∈[-432,-36]mm · 13 of 89 slices shown, 15 images]
[im 5/89  soft-tissue]
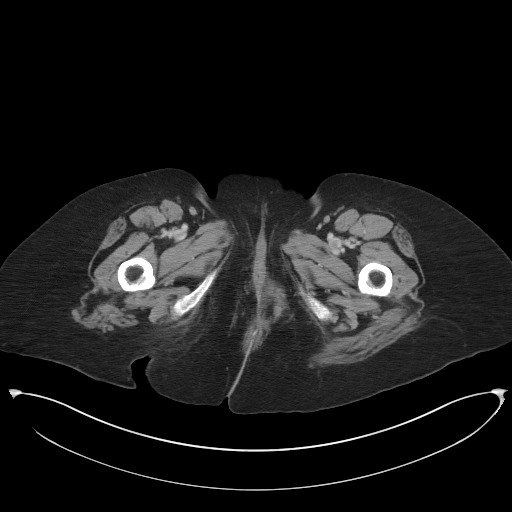
[im 5/89  bone]
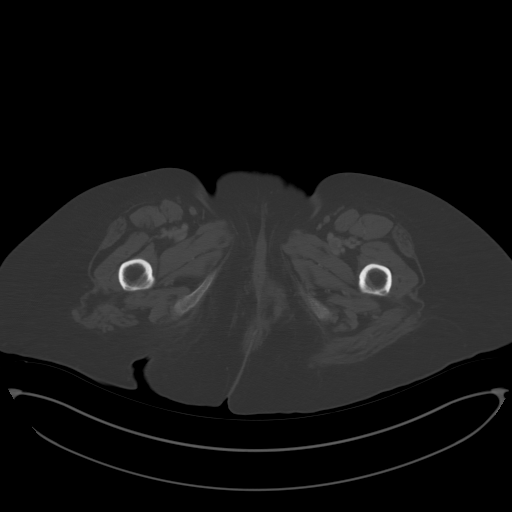
[im 14/89  soft-tissue]
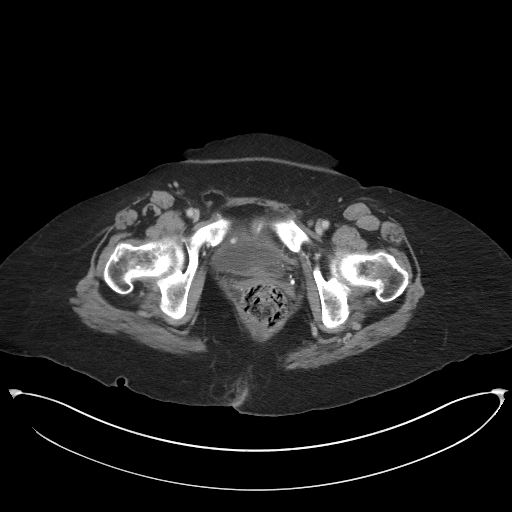
[im 19/89  soft-tissue]
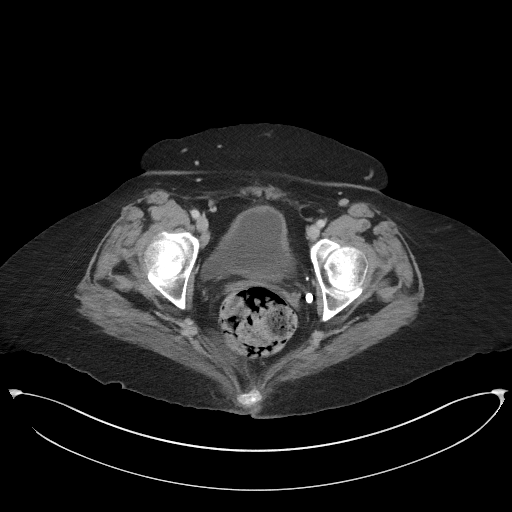
[im 24/89  soft-tissue]
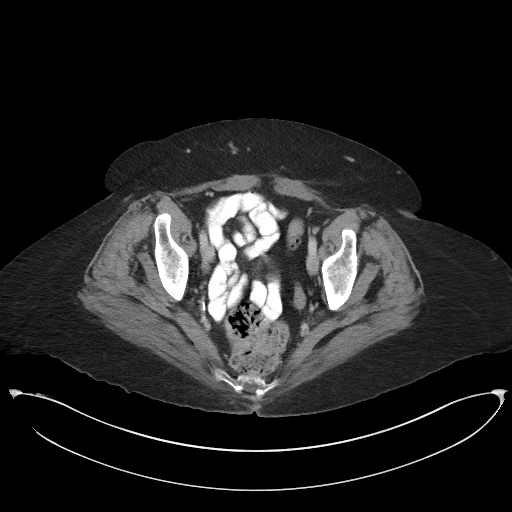
[im 33/89  soft-tissue]
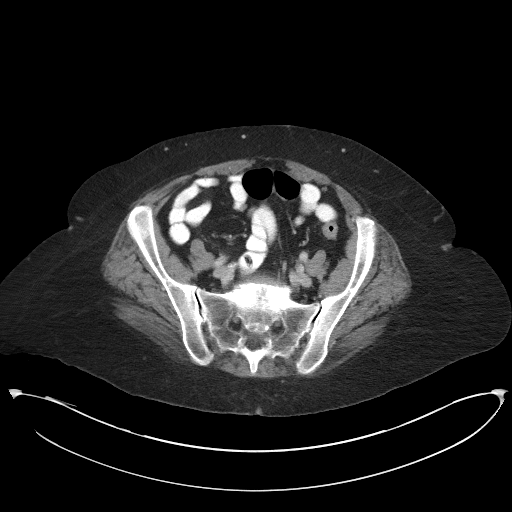
[im 38/89  soft-tissue]
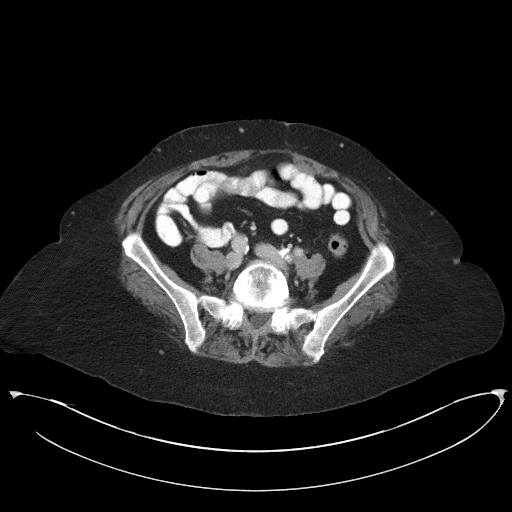
[im 47/89  soft-tissue]
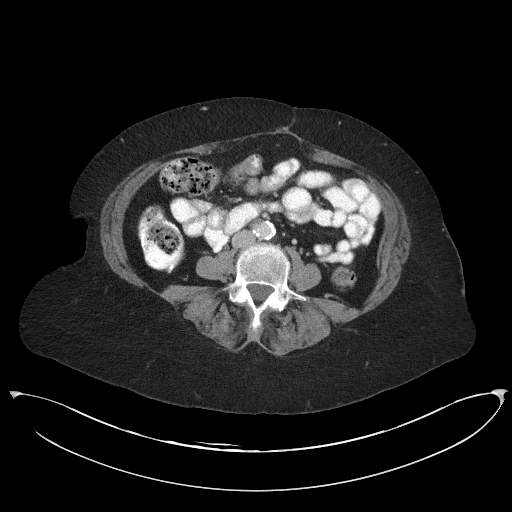
[im 51/89  soft-tissue]
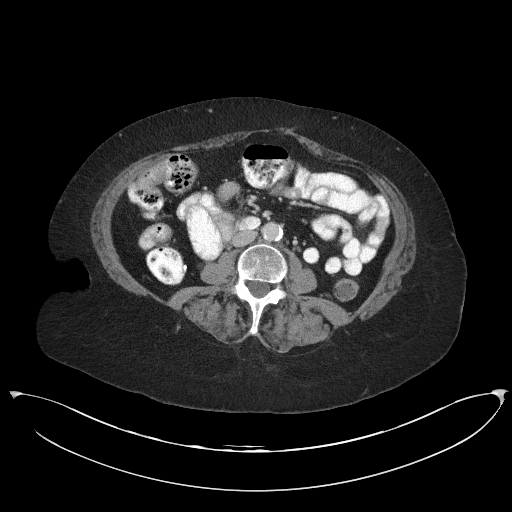
[im 56/89  soft-tissue]
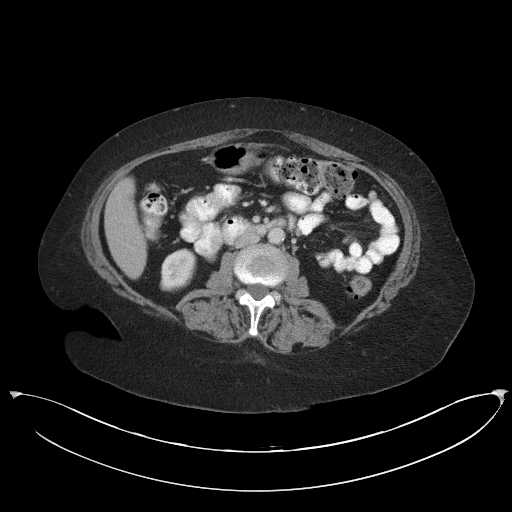
[im 56/89  bone]
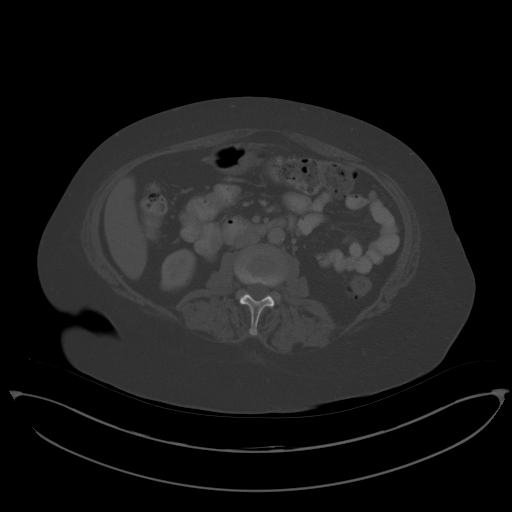
[im 65/89  soft-tissue]
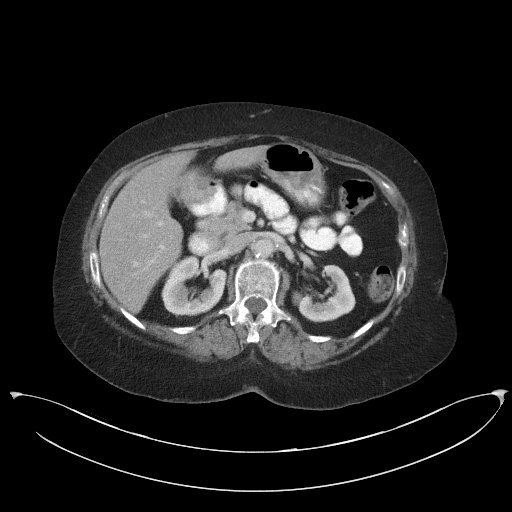
[im 70/89  soft-tissue]
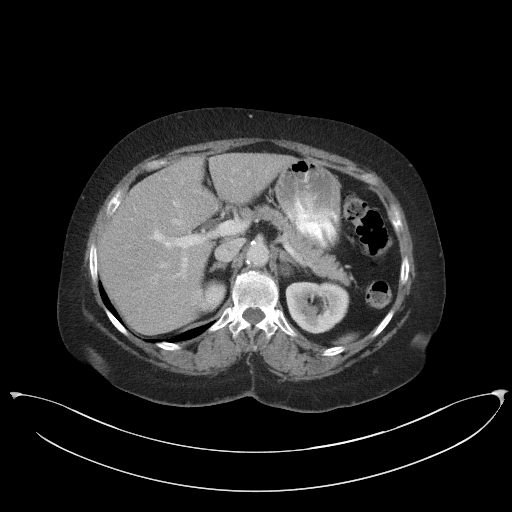
[im 75/89  soft-tissue]
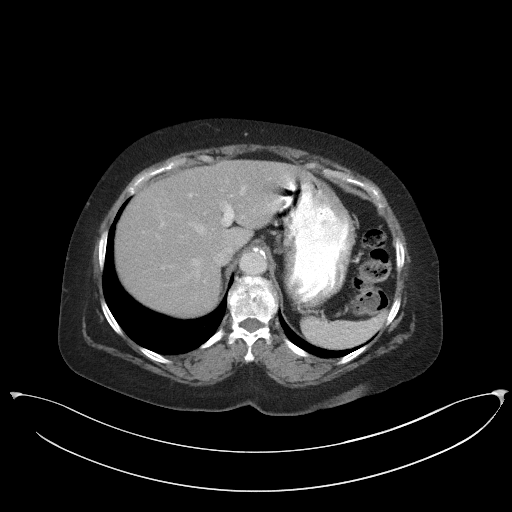
[im 84/89  soft-tissue]
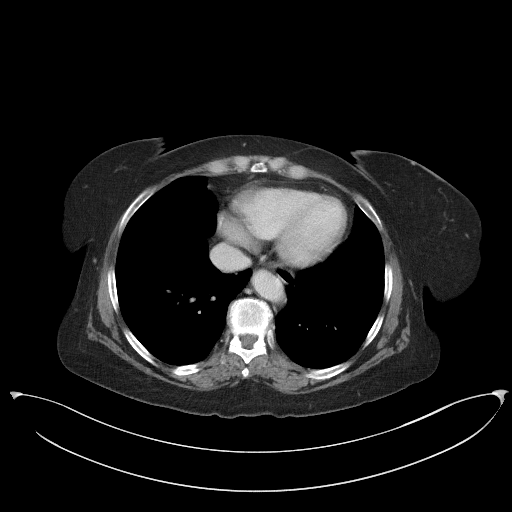

[Series 5: coronal st · coronal · 0.80mm/px · 3 of 94 slices shown]
[im 32/94  soft-tissue]
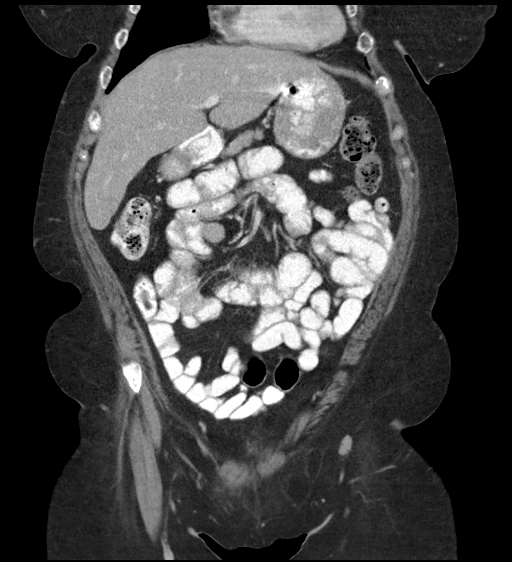
[im 42/94  soft-tissue]
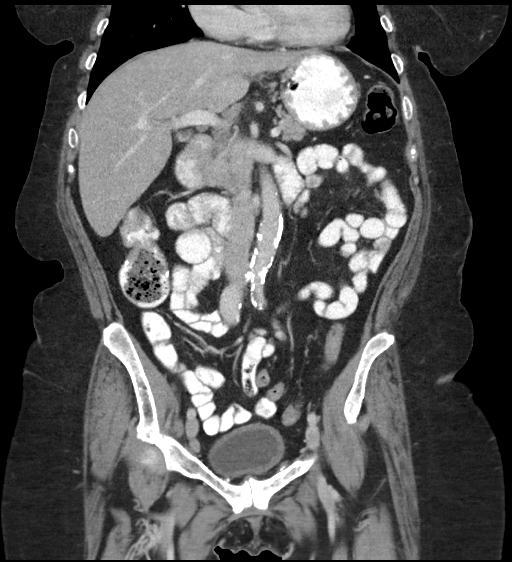
[im 52/94  soft-tissue]
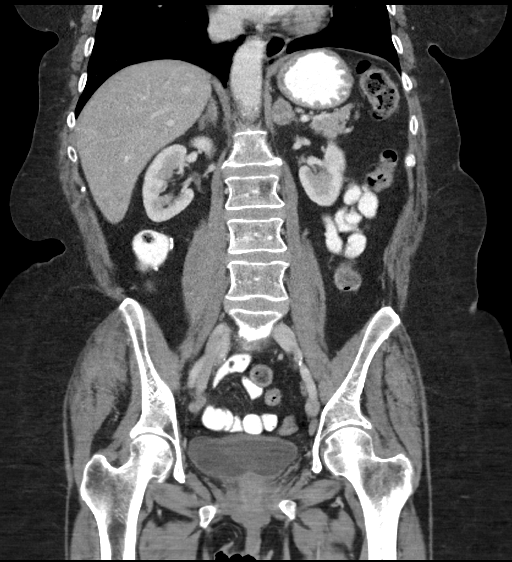

[16 of 46 positions shown; findings below may reference images not displayed]

FINDINGS: Lower Chest: No acute findings.

Hepatobiliary: No hepatic masses identified. Mild diffuse hepatic
steatosis. Prior cholecystectomy. No evidence of biliary
obstruction.

Pancreas:  No mass or inflammatory changes.

Spleen: Within normal limits in size and appearance.

Adrenals/Urinary Tract: A 2.1 cm homogeneous left adrenal mass is
seen which has nonspecific characteristics. Normal appearance of the
right adrenal gland and both kidneys.

Stomach/Bowel: No evidence of obstruction, inflammatory process or
abnormal fluid collections.

Vascular/Lymphatic: No pathologically enlarged lymph nodes. No
abdominal aortic aneurysm. Aortic atherosclerosis.

Reproductive: Prior hysterectomy noted. Adnexal regions are
unremarkable in appearance.

Other:  None.

Musculoskeletal:  No suspicious bone lesions identified.
IMPRESSION: No acute findings.

Mild hepatic steatosis.

2.1 cm indeterminate left adrenal mass. In patient without cancer
history, follow-up with adrenal protocol abdomen CT without and with
contrast is recommended in 6 months. This recommendation follows ACR
consensus guidelines: Management of Incidental Adrenal Masses: A
White Paper of the ACR Incidental Findings Committee. [HOSPITAL] 1193;14:[id number].

## 2019-07-14 ENCOUNTER — Other Ambulatory Visit: Payer: Self-pay

## 2019-07-14 ENCOUNTER — Telehealth: Payer: Self-pay | Admitting: Gastroenterology

## 2019-07-14 ENCOUNTER — Encounter: Payer: Self-pay | Admitting: Gastroenterology

## 2019-07-14 ENCOUNTER — Ambulatory Visit (INDEPENDENT_AMBULATORY_CARE_PROVIDER_SITE_OTHER): Payer: Medicaid Other | Admitting: Gastroenterology

## 2019-07-14 VITALS — BP 116/78 | HR 74 | Temp 97.5°F | Ht 64.0 in | Wt 181.4 lb

## 2019-07-14 DIAGNOSIS — R1084 Generalized abdominal pain: Secondary | ICD-10-CM

## 2019-07-14 DIAGNOSIS — R110 Nausea: Secondary | ICD-10-CM | POA: Diagnosis not present

## 2019-07-14 NOTE — Telephone Encounter (Signed)
I have called patients CNA and give her the number to call and reschedule.

## 2019-07-14 NOTE — Telephone Encounter (Signed)
Patient's CNA called requesting to reschedule the emptying test appt.

## 2019-07-14 NOTE — Progress Notes (Signed)
Chief Complaint:   Referring Provider:  Janine Limbo, PA-C      ASSESSMENT AND PLAN;   #1. Nausea but no vomiting- Neg EGD 03/16/2018 with neg Bx. H/O marijuana abuse. H/O PEG placement for failure to thrive 2017. May have functional component/nonulcer dyspepsia/cannabis hyperemesis syndrome. Has been to multiple gastroenterologists at multiple centers. Nl CBC, CMP and lipase #2. Generalized abd pain with abdominal wall tenderness. Neg CT abdo/pelvis 01/2019, 04/08/2018. Has H/O IBS with constipation. Neg EGD and colon in past #3. TI carcinoid s/p laparoscopic right hemicolectomy 2016 #4. H/O TAs (colonoscopy 01/2015-lipomatous ICV, small polyps s/p polypectomy) (as per notes) #5. Left Adrenal adenoma (CTA 04/08/2018). Rpt adrenal protocol CT 01/2019-stable.  No need to repeat CT.  Plan: -Continue protonix 40mg  po qd -Stop (metoclopramide)Reglan.  -Discontinue THC use. -Continue zofran 4mg  S/P Q8hrs prn. -Linzess 235mcg PO QD prn -Proceed with solid phase GES  -Call in 4 weeks. -FU in 6 mts.  Earlier, if still with problems.  Once UGI symptoms are better, would consider colonoscopy. She would like to hold off on colonoscopy at the present time.  HPI:    Kristi Romero is a 64 y.o. female  For follow-up visit Feels somewhat better Has been able to eat better Has gained weight.  Still with nausea.  However, Zofran helps.  Still complains of mid to upper abdominal discomfort after eating.  Despite of good appetite she complains of " anorexia".  No dysphagia.  Denies having any significant nausea/vomiting.  C/o Numbness in mouth and hands over the last 1 month which is new.  I doubt it is related to Reglan but we will stop Reglan.  Seen by Ortho -has been diagnosed as having spinal problems.  We do not have all the records.    She is taking Ensure without any problems.  Her constipation is much better.  Now she uses Linzess only on as needed basis.  On wheelchair.  She  continues to use marijuana.  Wt Readings from Last 3 Encounters:  07/14/19 181 lb 6 oz (82.3 kg)  02/17/19 186 lb (84.4 kg)  05/11/18 169 lb (76.7 kg)     In 2017, PEG was placed d/t failure to thrive-patient weighed 108lb at that time, after feeds patient had gained weight to 150lb, Removed 05/26/2016 at [facility].  CT AP 02/25/2019 IMPRESSION: 1. There is a benign, fat containing left adrenal adenoma measuring 2.0 x 1.5 cm with internal fat attenuation (HU = -2) (series 2, image 27). No further routine follow-up is required. 2. Partially imaged postoperative findings of partial right colectomy. 3.  Aortic Atherosclerosis (ICD10-I70.0). Past Medical History:  Diagnosis Date  . Chronic back pain   . Hiatal hernia   . Hypercholesteremia   . Hypertension   . Nausea & vomiting 03/17/2018  . Numbness     Past Surgical History:  Procedure Laterality Date  . BIOPSY  03/16/2018   Procedure: BIOPSY;  Surgeon: Ladene Artist, MD;  Location: [facility] ENDOSCOPY;  Service: Endoscopy;;  . COLONOSCOPY  2018   said they removed a mass. Dr Harl Favor [place]   . ESOPHAGOGASTRODUODENOSCOPY  02/2018  . ESOPHAGOGASTRODUODENOSCOPY (EGD) WITH PROPOFOL N/A 03/16/2018   Procedure: ESOPHAGOGASTRODUODENOSCOPY (EGD) WITH PROPOFOL;  Surgeon: Ladene Artist, MD;  Location: [facility] ENDOSCOPY;  Service: Endoscopy;  Laterality: N/A;  . PARTIAL HYSTERECTOMY    . TOE SURGERY Bilateral    Outpatient Encounter Medications as of 02/17/2019  Medication Sig  . acetaminophen (TYLENOL) 325 MG tablet Take 2  tablets (650 mg total) by mouth every 6 (six) hours as needed for mild pain (or Fever >/= 101).  . DULoxetine (CYMBALTA) 60 MG capsule Take 60 mg by mouth daily.  . hyoscyamine (LEVSIN SL) 0.125 MG SL tablet Place 2 tablets (0.25 mg total) under the tongue 4 (four) times daily -  before meals and at bedtime.  . Linaclotide (LINZESS) 290 MCG CAPS capsule Take 290 mcg by mouth daily as needed.   . meclizine (ANTIVERT) 25 MG  tablet Take 25 mg by mouth every 8 (eight) hours as needed for dizziness.  . metoCLOPramide (REGLAN) 5 MG tablet Take 1 tablet (5 mg total) by mouth 3 (three) times daily before meals.  . metoprolol tartrate (LOPRESSOR) 25 MG tablet Take 1 tablet (25 mg total) by mouth 2 (two) times daily.  . pantoprazole (PROTONIX) 40 MG tablet Take 1 tablet (40 mg total) by mouth 2 (two) times daily before a meal.  . atorvastatin (LIPITOR) 40 MG tablet Take 40 mg by mouth daily.  .    . ondansetron (ZOFRAN ODT) 4 MG disintegrating tablet Take 1 tablet (4 mg total) by mouth every 6 (six) hours as needed for nausea or vomiting.   No facility-administered encounter medications on file as of 02/17/2019.     Family History  Problem Relation Age of Onset  . Lung cancer Father   . Heart disease Father   . Hypertension Father   . Heart disease Mother   . Hypertension Mother   . Cancer Mother     Social History   Tobacco Use  . Smoking status: Former Research scientist (life sciences)  . Smokeless tobacco: Never Used  . Tobacco comment: quit long time ago   Substance Use Topics  . Alcohol use: No    Alcohol/week: 0.0 standard drinks  . Drug use: Yes    Types: Marijuana    Comment: Currently smokes about 7 marijuana "joints" per week.    Allergies  Allergen Reactions  . Codeine Nausea Only  . Lisinopril Swelling    Review of Systems:  neg     Physical Exam:    BP 116/78   Pulse 74   Temp (!) 97.5 F (36.4 C)   Ht 5\' 4"  (1.626 m)   Wt 181 lb 6 oz (82.3 kg)   LMP  (LMP Unknown)   BMI 31.13 kg/m  Filed Weights   07/14/19 0933  Weight: 181 lb 6 oz (82.3 kg)   Constitutional:  Well-developed, in no acute distress. On wheelchair Psychiatric: Normal mood and affect. Behavior is normal. HEENT: Pupils normal.  Conjunctivae are normal. No scleral icterus. Neck supple.  Cardiovascular: Normal rate, regular rhythm. No edema Pulmonary/chest: Effort normal and breath sounds normal. No wheezing, rales or  rhonchi. Abdominal: Soft, nondistended. Nontender. Bowel sounds active throughout. There are no masses palpable. No hepatomegaly.  Multiple well-healed surgical scars. Rectal:  defered Neurological: Alert and oriented to person place and time. Skin: Skin is warm and dry. No rashes noted.  Data Reviewed: I have personally reviewed following labs and imaging studies  CBC: CBC Latest Ref Rng & Units 02/23/2019 05/11/2018 03/16/2018  WBC 4.0 - 10.5 K/uL 7.1 8.0 9.4  Hemoglobin 12.0 - 15.0 g/dL 12.6 12.9 12.0  Hematocrit 36.0 - 46.0 % 38.1 38.7 36.3  Platelets 150.0 - 400.0 K/uL 271.0 311.0 254    CMP: CMP Latest Ref Rng & Units 02/23/2019 05/11/2018 03/19/2018  Glucose 70 - 99 mg/dL 70 73 95  BUN 6 - 23 mg/dL 8  10 <5(L)  Creatinine 0.40 - 1.20 mg/dL 0.74 0.65 0.79  Sodium 135 - 145 mEq/L 140 138 136  Potassium 3.5 - 5.1 mEq/L 4.1 6.0 No hemolysis noted(H) 3.4(L)  Chloride 96 - 112 mEq/L 105 101 96(L)  CO2 19 - 32 mEq/L 32 31 30  Calcium 8.4 - 10.5 mg/dL 9.2 10.1 9.3  Total Protein 6.0 - 8.3 g/dL 7.4 7.5 -  Total Bilirubin 0.2 - 1.2 mg/dL 0.3 0.3 -  Alkaline Phos 39 - 117 U/L 84 89 -  AST 0 - 37 U/L 12 14 -  ALT 0 - 35 U/L 9 9 -  25 minutes spent with the patient today. Greater than 50% was spent in counseling and coordination of care with the patient    Carmell Austria, MD 07/14/2019, 9:57 AM  Cc: Janine Limbo, PA-C

## 2019-07-14 NOTE — Patient Instructions (Signed)
If you are age 64 or older, your body mass index should be between 23-30. Your Body mass index is 31.13 kg/m. If this is out of the aforementioned range listed, please consider follow up with your Primary Care Provider.  If you are age 2 or younger, your body mass index should be between 19-25. Your Body mass index is 31.13 kg/m. If this is out of the aformentioned range listed, please consider follow up with your Primary Care Provider.   You have been scheduled for a gastric emptying scan at [facility] on 07/28/19 at 10:30am. Please arrive at least 15 minutes prior to your appointment for registration. Please make certain not to have anything to eat or drink after midnight the night before your test. Hold all stomach medications (ex: Zofran, phenergan, Reglan) 48 hours prior to your test. If you need to reschedule your appointment, please contact radiology scheduling at [phone]. _____________________________________________________________________ A gastric-emptying study measures how long it takes for food to move through your stomach. There are several ways to measure stomach emptying. In the most common test, you eat food that contains a small amount of radioactive material. A scanner that detects the movement of the radioactive material is placed over your abdomen to monitor the rate at which food leaves your stomach. This test normally takes about 4 hours to complete. _____________________________________________________________________   Stop Reglan   Thank you,  Dr. Jackquline Denmark

## 2019-07-28 ENCOUNTER — Ambulatory Visit (HOSPITAL_COMMUNITY)
Admission: RE | Admit: 2019-07-28 | Discharge: 2019-07-28 | Disposition: A | Discharge status: Home / Self Care | Payer: Medicaid Other | Source: Ambulatory Visit | Attending: Gastroenterology | Admitting: Gastroenterology

## 2019-07-28 ENCOUNTER — Other Ambulatory Visit: Payer: Self-pay

## 2019-07-28 DIAGNOSIS — R1084 Generalized abdominal pain: Secondary | ICD-10-CM | POA: Diagnosis present

## 2019-07-28 DIAGNOSIS — R110 Nausea: Secondary | ICD-10-CM | POA: Diagnosis not present

## 2019-07-28 MED ORDER — TECHNETIUM TC 99M SULFUR COLLOID
2.0000 | Freq: Once | INTRAVENOUS | Status: AC | PRN
  Administered 2019-07-28: 2 via ORAL

## 2019-11-07 ENCOUNTER — Ambulatory Visit: Payer: Medicaid Other | Admitting: Gastroenterology

## 2019-11-15 ENCOUNTER — Other Ambulatory Visit: Payer: Self-pay

## 2019-11-15 MED ORDER — ONDANSETRON 4 MG PO TBDP: 4.0000 mg | ORAL_TABLET | Freq: Four times a day (QID) | ORAL | 2 refills | Status: DC | PRN

## 2019-11-18 DIAGNOSIS — R110 Nausea: Secondary | ICD-10-CM | POA: Insufficient documentation

## 2019-11-18 HISTORY — DX: Nausea: R11.0

## 2019-12-12 ENCOUNTER — Telehealth: Payer: Self-pay | Admitting: Gastroenterology

## 2019-12-12 NOTE — Telephone Encounter (Signed)
Pt was seen in the hospital on Fri 8/13 due to numbness and was informed to see if she could be worked in prior to 12/27/2019 with Dr Lyndel Safe, no appt available

## 2019-12-13 NOTE — Telephone Encounter (Signed)
Spoke with patient to let her know that Dr Lyndel Safe doesn't have an opening prior to this date but if we have a cancellation, we will call her.

## 2019-12-27 ENCOUNTER — Encounter: Payer: Self-pay | Admitting: Gastroenterology

## 2019-12-27 ENCOUNTER — Ambulatory Visit (INDEPENDENT_AMBULATORY_CARE_PROVIDER_SITE_OTHER): Payer: Medicaid Other | Admitting: Gastroenterology

## 2019-12-27 VITALS — BP 108/72 | HR 74 | Ht 64.0 in | Wt 155.2 lb

## 2019-12-27 DIAGNOSIS — R1084 Generalized abdominal pain: Secondary | ICD-10-CM | POA: Diagnosis not present

## 2019-12-27 DIAGNOSIS — R110 Nausea: Secondary | ICD-10-CM

## 2019-12-27 MED ORDER — ONDANSETRON 4 MG PO TBDP: 4.0000 mg | ORAL_TABLET | Freq: Three times a day (TID) | ORAL | 0 refills | Status: DC | PRN

## 2019-12-27 MED ORDER — METOCLOPRAMIDE HCL 5 MG PO TABS: 5.0000 mg | ORAL_TABLET | Freq: Three times a day (TID) | ORAL | 0 refills | Status: DC

## 2019-12-27 NOTE — Progress Notes (Signed)
Chief Complaint: FU  Referring Provider:  Janine Limbo, PA-C      ASSESSMENT AND PLAN;   #1. Nausea but no vomiting- Neg EGD 03/16/2018 with neg Bx. H/O marijuana abuse. H/O PEG placement for failure to thrive 2017. May have functional component/nonulcer dyspepsia/cannabis hyperemesis syndrome. Has been to multiple gastroenterologists at multiple centers. Nl CBC, CMP and lipase. Neg GES 4/20121 #2. Generalized abd pain with abdominal wall tenderness. Neg CT abdo/pelvis 01/2019, 04/08/2018. Has H/O IBS-C. Neg EGD and colon in past #3. TI carcinoid s/p laparoscopic right hemicolectomy 2016 #4. H/O TAs (colonoscopy 01/2015-lipomatous ICV, small polyps s/p polypectomy) (as per notes) #5. Left Adrenal adenoma (CTA 04/08/2018). Rpt adrenal protocol CT 01/2019-stable.  No need to repeat CT.  Plan: -Continue protonix 40mg  po qd -Restart (metoclopramide)Reglan 5mg  po TID  #120. -Cut down THC use. -Continue zofran 4mg  S/P Q8hrs prn #40 -Linzess 252mcg PO QD prn -Proceed with UGI with SB series. -Small but more frequent meals. -Monitor weight.  If there is any further weight loss, would consider repeat CT Abdo/pelvis followed by trial of Remeron. -Resume Ensure. -Call in 1 week. -FU in 6 mts.  Earlier, if still with problems or further weight loss.  Once UGI symptoms are better, would consider colonoscopy. She would like to hold off on colonoscopy at the present time.  HPI:    Kristi Romero is a 64 y.o. female  For follow-up visit Still with nausea but no significant vomiting. Per patient if she would to vomit-she would vomit up phlegm and yellowish gastric juice but no food. Continues to use marijuana.  Has been using more lately.  Has been having postprandial epigastric discomfort.  No lower abdominal pain.  Constipation is better  Has lost weight from 181 to 155lb. admits that she has not been eating well.   Wt Readings from Last 3 Encounters:  12/27/19 155 lb 4 oz (70.4 kg)    07/14/19 181 lb 6 oz (82.3 kg)  02/17/19 186 lb (84.4 kg)     In 2017, PEG was placed d/t failure to thrive-patient weighed 108lb at that time, after feeds patient had gained weight to 150lb, Removed 05/26/2016 at [facility].  CT AP 02/25/2019 IMPRESSION: 1. There is a benign, fat containing left adrenal adenoma measuring 2.0 x 1.5 cm with internal fat attenuation (HU = -2) (series 2, image 27). No further routine follow-up is required. 2. Partially imaged postoperative findings of partial right colectomy. 3.  Aortic Atherosclerosis (ICD10-I70.0).  GES 07/28/2019: neg. see report under the imaging section. Past Medical History:  Diagnosis Date  . Chronic back pain   . Hiatal hernia   . Hypercholesteremia   . Hypertension   . Nausea & vomiting 03/17/2018  . Numbness     Past Surgical History:  Procedure Laterality Date  . BIOPSY  03/16/2018   Procedure: BIOPSY;  Surgeon: Ladene Artist, MD;  Location: [facility] ENDOSCOPY;  Service: Endoscopy;;  . COLONOSCOPY  2018   said they removed a mass. Dr Harl Favor [place]   . ESOPHAGOGASTRODUODENOSCOPY  02/2018  . ESOPHAGOGASTRODUODENOSCOPY (EGD) WITH PROPOFOL N/A 03/16/2018   Procedure: ESOPHAGOGASTRODUODENOSCOPY (EGD) WITH PROPOFOL;  Surgeon: Ladene Artist, MD;  Location: [facility] ENDOSCOPY;  Service: Endoscopy;  Laterality: N/A;  . PARTIAL HYSTERECTOMY    . TOE SURGERY Bilateral    Outpatient Encounter Medications as of 02/17/2019  Medication Sig  . acetaminophen (TYLENOL) 325 MG tablet Take 2 tablets (650 mg total) by mouth every 6 (six) hours as needed for  mild pain (or Fever >/= 101).  . DULoxetine (CYMBALTA) 60 MG capsule Take 60 mg by mouth daily.  . hyoscyamine (LEVSIN SL) 0.125 MG SL tablet Place 2 tablets (0.25 mg total) under the tongue 4 (four) times daily -  before meals and at bedtime.  . Linaclotide (LINZESS) 290 MCG CAPS capsule Take 290 mcg by mouth daily as needed.   . meclizine (ANTIVERT) 25 MG tablet Take 25 mg by mouth every 8  (eight) hours as needed for dizziness.  . metoCLOPramide (REGLAN) 5 MG tablet Take 1 tablet (5 mg total) by mouth 3 (three) times daily before meals.  . metoprolol tartrate (LOPRESSOR) 25 MG tablet Take 1 tablet (25 mg total) by mouth 2 (two) times daily.  . pantoprazole (PROTONIX) 40 MG tablet Take 1 tablet (40 mg total) by mouth 2 (two) times daily before a meal.  . atorvastatin (LIPITOR) 40 MG tablet Take 40 mg by mouth daily.  .    . ondansetron (ZOFRAN ODT) 4 MG disintegrating tablet Take 1 tablet (4 mg total) by mouth every 6 (six) hours as needed for nausea or vomiting.   No facility-administered encounter medications on file as of 02/17/2019.     Family History  Problem Relation Age of Onset  . Lung cancer Father   . Heart disease Father   . Hypertension Father   . Heart disease Mother   . Hypertension Mother   . Cancer Mother     Social History   Tobacco Use  . Smoking status: Former Research scientist (life sciences)  . Smokeless tobacco: Never Used  . Tobacco comment: quit long time ago   Vaping Use  . Vaping Use: Never used  Substance Use Topics  . Alcohol use: No    Alcohol/week: 0.0 standard drinks  . Drug use: Yes    Types: Marijuana    Comment: Currently smokes about 7 marijuana "joints" per week.    Allergies  Allergen Reactions  . Codeine Nausea Only  . Lisinopril Swelling    Review of Systems:  neg     Physical Exam:    BP 108/72   Pulse 74   Ht 5\' 4"  (1.626 m)   Wt 155 lb 4 oz (70.4 kg)   LMP  (LMP Unknown)   BMI 26.65 kg/m  Filed Weights   12/27/19 1028  Weight: 155 lb 4 oz (70.4 kg)   Constitutional:  Well-developed, in no acute distress. On wheelchair Psychiatric: Normal mood and affect. Behavior is normal. HEENT: Pupils normal.  Conjunctivae are normal. No scleral icterus. Neck supple.  Cardiovascular: Normal rate, regular rhythm. No edema Pulmonary/chest: Effort normal and breath sounds normal. No wheezing, rales or rhonchi. Abdominal: Soft,  nondistended. Nontender. Bowel sounds active throughout. There are no masses palpable. No hepatomegaly.  Multiple well-healed surgical scars. Rectal:  defered Neurological: Alert and oriented to person place and time. Skin: Skin is warm and dry. No rashes noted.  Data Reviewed: I have personally reviewed following labs and imaging studies  CBC: CBC Latest Ref Rng & Units 02/23/2019 05/11/2018 03/16/2018  WBC 4.0 - 10.5 K/uL 7.1 8.0 9.4  Hemoglobin 12.0 - 15.0 g/dL 12.6 12.9 12.0  Hematocrit 36 - 46 % 38.1 38.7 36.3  Platelets 150 - 400 K/uL 271.0 311.0 254    CMP: CMP Latest Ref Rng & Units 02/23/2019 05/11/2018 03/19/2018  Glucose 70 - 99 mg/dL 70 73 95  BUN 6 - 23 mg/dL 8 10 <5(L)  Creatinine 0.40 - 1.20 mg/dL 0.74 0.65 0.79  Sodium 135 - 145 mEq/L 140 138 136  Potassium 3.5 - 5.1 mEq/L 4.1 6.0 No hemolysis noted(H) 3.4(L)  Chloride 96 - 112 mEq/L 105 101 96(L)  CO2 19 - 32 mEq/L 32 31 30  Calcium 8.4 - 10.5 mg/dL 9.2 10.1 9.3  Total Protein 6.0 - 8.3 g/dL 7.4 7.5 -  Total Bilirubin 0.2 - 1.2 mg/dL 0.3 0.3 -  Alkaline Phos 39 - 117 U/L 84 89 -  AST 0 - 37 U/L 12 14 -  ALT 0 - 35 U/L 9 9 -      Carmell Austria, MD 12/27/2019, 10:58 AM  Cc: Janine Limbo, PA-C

## 2019-12-27 NOTE — Patient Instructions (Signed)
If you are age 64 or older, your body mass index should be between 23-30. Your Body mass index is 26.65 kg/m. If this is out of the aforementioned range listed, please consider follow up with your Primary Care Provider.  If you are age 49 or younger, your body mass index should be between 19-25. Your Body mass index is 26.65 kg/m. If this is out of the aformentioned range listed, please consider follow up with your Primary Care Provider.   You have been scheduled for an Upper GI Series and Small Bowel Follow Thru at [facility]. Your appointment is on 01/06/20 at 9:30am. Please arrive 15 minutes prior to your test for registration. Make certain not to have anything to eat or drink after midnight on the night before your test. If you need to reschedule, please contact radiology at [phone]. --------------------------------------------------------------------------------------------------------------- An upper GI series uses x rays to help diagnose problems of the upper GI tract, which includes the esophagus, stomach, and duodenum. The duodenum is the first part of the small intestine. An upper GI series is conducted by a radiology technologist or a radiologist--a doctor who specializes in x-ray imaging--at a hospital or outpatient center. While sitting or standing in front of an x-ray machine, the patient drinks barium liquid, which is often white and has a chalky consistency and taste. The barium liquid coats the lining of the upper GI tract and makes signs of disease show up more clearly on x rays. X-ray video, called fluoroscopy, is used to view the barium liquid moving through the esophagus, stomach, and duodenum. Additional x rays and fluoroscopy are performed while the patient lies on an x-ray table. To fully coat the upper GI tract with barium liquid, the technologist or radiologist may press on the abdomen or ask the patient to change position. Patients hold still in various positions, allowing  the technologist or radiologist to take x rays of the upper GI tract at different angles. If a technologist conducts the upper GI series, a radiologist will later examine the images to look for problems.  This test typically takes about 1 hour to complete --------------------------------------------------------------------------------------------------------------------------------------------- The Small Bowel Follow Thru examination is used to visualize the entire small bowel (intestines); specifically the connection between the small and large intestine. You will be positioned on a flat x-ray table and an image of your abdomen taken. Then the technologist will show the x-ray to the radiologist. The radiologist will instruct your technologist how much (1-2 cups) barium sulfate you will drink and when to begin taking the timed x-rays, usually 15-30 minutes after you begin drinking. Barium is a harmless substance that will highlight your small intestine by absorbing x-ray. The taste is chalky and it feels very heavy both in the cup and in your stomach.  After the first x-ray is taken and shown to the radiologist, he/she will determine when the next image is to be taken. This is repeated until the barium has reached the end of the small intestine and enters the beginning of the colon (cecum). At such time when the barium spills into the colon, you will be positioned on the x-ray table once again. The radiologist will use a fluoroscopic camera to take some detailed pictures of the connection between your small intestine and colon. The fluoroscope is an x-ray unit that works with a television/computer screen. The radiologist will apply pressure to your abdomen with his/her hand and a lead glove, a plastic paddle, or a paddle with an inflated rubber balloon  on the end. This is to spread apart your loops of intestine so he/she can see all areas.   This test typically takes around 1 hour to  complete.   Important   Drink plenty of water (8-10 cups/day) for a few days following the procedure to avoid constipation and blockage. The barium will make your stools white for a few days. --------------------------------------------------------------------------------------------------------------------------------------------  Stop using marijuana.  We have sent the following medications to your pharmacy for you to pick up at your convenience: Reglan Zofran   Follow up in 6 months  Thank you,  Dr. Jackquline Denmark

## 2020-01-06 ENCOUNTER — Ambulatory Visit (HOSPITAL_COMMUNITY)
Admission: RE | Admit: 2020-01-06 | Discharge: 2020-01-06 | Disposition: A | Discharge status: Home / Self Care | Payer: Medicaid Other | Source: Ambulatory Visit | Attending: Gastroenterology | Admitting: Gastroenterology

## 2020-01-06 ENCOUNTER — Other Ambulatory Visit: Payer: Self-pay | Admitting: Gastroenterology

## 2020-01-06 DIAGNOSIS — R110 Nausea: Secondary | ICD-10-CM | POA: Diagnosis not present

## 2020-01-06 DIAGNOSIS — R1084 Generalized abdominal pain: Secondary | ICD-10-CM | POA: Diagnosis present

## 2020-05-07 ENCOUNTER — Emergency Department (HOSPITAL_COMMUNITY)
Admission: EM | Admit: 2020-05-07 | Discharge: 2020-05-08 | Disposition: A | Discharge status: Home / Self Care | Payer: Medicare Other | Attending: Emergency Medicine | Admitting: Emergency Medicine

## 2020-05-07 ENCOUNTER — Other Ambulatory Visit: Payer: Self-pay

## 2020-05-07 ENCOUNTER — Encounter (HOSPITAL_COMMUNITY): Payer: Self-pay

## 2020-05-07 DIAGNOSIS — K219 Gastro-esophageal reflux disease without esophagitis: Secondary | ICD-10-CM | POA: Insufficient documentation

## 2020-05-07 DIAGNOSIS — R110 Nausea: Secondary | ICD-10-CM | POA: Diagnosis not present

## 2020-05-07 DIAGNOSIS — Z79899 Other long term (current) drug therapy: Secondary | ICD-10-CM | POA: Diagnosis not present

## 2020-05-07 DIAGNOSIS — Z87891 Personal history of nicotine dependence: Secondary | ICD-10-CM | POA: Diagnosis not present

## 2020-05-07 DIAGNOSIS — I10 Essential (primary) hypertension: Secondary | ICD-10-CM | POA: Diagnosis not present

## 2020-05-07 DIAGNOSIS — K2100 Gastro-esophageal reflux disease with esophagitis, without bleeding: Secondary | ICD-10-CM

## 2020-05-07 DIAGNOSIS — R1013 Epigastric pain: Secondary | ICD-10-CM | POA: Diagnosis present

## 2020-05-07 DIAGNOSIS — R531 Weakness: Secondary | ICD-10-CM | POA: Diagnosis not present

## 2020-05-07 LAB — CBC
HCT: 44.2 % (ref 36.0–46.0)
Hemoglobin: 14.1 g/dL (ref 12.0–15.0)
MCH: 30.7 pg (ref 26.0–34.0)
MCHC: 31.9 g/dL (ref 30.0–36.0)
MCV: 96.3 fL (ref 80.0–100.0)
Platelets: 373 10*3/uL (ref 150–400)
RBC: 4.59 MIL/uL (ref 3.87–5.11)
RDW: 12.9 % (ref 11.5–15.5)
WBC: 7.8 10*3/uL (ref 4.0–10.5)
nRBC: 0 % (ref 0.0–0.2)

## 2020-05-07 LAB — COMPREHENSIVE METABOLIC PANEL
ALT: 18 U/L (ref 0–44)
AST: 29 U/L (ref 15–41)
Albumin: 3.5 g/dL (ref 3.5–5.0)
Alkaline Phosphatase: 99 U/L (ref 38–126)
Anion gap: 14 (ref 5–15)
BUN: 7 mg/dL — ABNORMAL LOW (ref 8–23)
CO2: 24 mmol/L (ref 22–32)
Calcium: 9.6 mg/dL (ref 8.9–10.3)
Chloride: 100 mmol/L (ref 98–111)
Creatinine, Ser: 0.8 mg/dL (ref 0.44–1.00)
GFR, Estimated: >60 mL/min (ref >60)
Glucose, Bld: 94 mg/dL (ref 70–99)
Potassium: 3.9 mmol/L (ref 3.5–5.1)
Sodium: 138 mmol/L (ref 135–145)
Total Bilirubin: 0.8 mg/dL (ref 0.3–1.2)
Total Protein: 7.8 g/dL (ref 6.5–8.1)

## 2020-05-07 LAB — LIPASE, BLOOD: Lipase: 20 U/L (ref 11–51)

## 2020-05-07 MED ORDER — PANTOPRAZOLE SODIUM 40 MG IV SOLR
40.0000 mg | Freq: Once | INTRAVENOUS | Status: AC
  Administered 2020-05-07: 40 mg via INTRAVENOUS
  Filled 2020-05-07: qty 40

## 2020-05-07 MED ORDER — DIPHENHYDRAMINE HCL 50 MG/ML IJ SOLN
12.5000 mg | Freq: Once | INTRAMUSCULAR | Status: AC
  Administered 2020-05-07: 12.5 mg via INTRAVENOUS
  Filled 2020-05-07: qty 1

## 2020-05-07 MED ORDER — METOCLOPRAMIDE HCL 5 MG/ML IJ SOLN
10.0000 mg | Freq: Once | INTRAMUSCULAR | Status: AC
  Administered 2020-05-07: 10 mg via INTRAVENOUS
  Filled 2020-05-07: qty 2

## 2020-05-07 NOTE — ED Provider Notes (Signed)
[facility] EMERGENCY DEPARTMENT Provider Note   CSN: [id number] Arrival date & time: 05/07/20  0762     History Chief Complaint  Patient presents with  . Weakness  . Abdominal Pain    Kristi Romero is a 65 y.o. female.  Patient to ED for evaluation of epigastric pain for the past month. She reports nausea without vomiting, being unable to eat because of nausea. Last bowel movement was yesterday. She continues to pass flatus. Her only previous abdominal surgery is a hysterectomy. She reports being seen at [facility] 04/28/20 for same and provided Zofran for her symptoms of nausea. She reports this is not helping and she continues to have pain and nausea. No fever, chest pain, SOB or urinary symptoms.  The history is provided by the patient. No language interpreter was used.  Weakness Associated symptoms: abdominal pain and nausea   Associated symptoms: no chest pain, no cough, no diarrhea, no fever, no shortness of breath and no vomiting   Abdominal Pain Associated symptoms: nausea   Associated symptoms: no chest pain, no constipation, no cough, no diarrhea, no fever, no shortness of breath and no vomiting        Past Medical History:  Diagnosis Date  . Chronic back pain   . Hiatal hernia   . Hypercholesteremia   . Hypertension   . Nausea & vomiting 03/17/2018  . Numbness     Patient Active Problem List   Diagnosis Date Noted  . Vomiting 03/17/2018  . Intractable nausea and vomiting 03/16/2018  . Hypoglycemia 03/16/2018  . Adrenal adenoma 03/16/2018  . Hypertension 03/16/2018  . Anxiety and depression 03/16/2018  . Abdominal pain, epigastric   . 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 Chronic back pain   . Hyperlipidemia   . Hiatal hernia     Past Surgical History:  Procedure Laterality Date  . BIOPSY  03/16/2018   Procedure: BIOPSY;  Surgeon: Ladene Artist, MD;  Location: [facility] ENDOSCOPY;  Service: Endoscopy;;  .  COLONOSCOPY  2018   said they removed a mass. Dr Harl Favor [place]   . ESOPHAGOGASTRODUODENOSCOPY  02/2018  . ESOPHAGOGASTRODUODENOSCOPY (EGD) WITH PROPOFOL N/A 03/16/2018   Procedure: ESOPHAGOGASTRODUODENOSCOPY (EGD) WITH PROPOFOL;  Surgeon: Ladene Artist, MD;  Location: [facility] ENDOSCOPY;  Service: Endoscopy;  Laterality: N/A;  . PARTIAL HYSTERECTOMY    . TOE SURGERY Bilateral      OB History   No obstetric history on file.     Family History  Problem Relation Age of Onset  . Lung cancer Father   . Heart disease Father   . Hypertension Father   . Heart disease Mother   . Hypertension Mother   . Cancer Mother     Social History   Tobacco Use  . Smoking status: Former Research scientist (life sciences)  . Smokeless tobacco: Never Used  . Tobacco comment: quit long time ago   Vaping Use  . Vaping Use: Never used  Substance Use Topics  . Alcohol use: No    Alcohol/week: 0.0 standard drinks  . Drug use: Yes    Types: Marijuana    Comment: Currently smokes about 7 marijuana "joints" per week.    Home Medications Prior to Admission medications   Medication Sig Start Date End Date Taking? Authorizing Provider  atorvastatin (LIPITOR) 80 MG tablet Take 80 mg by mouth daily. Patient not taking: Reported on 12/27/2019    [provider]  baclofen (LIORESAL) 10 MG tablet Take 10 mg by mouth  2 (two) times daily. Patient not taking: Reported on 12/27/2019 11/16/18   [provider]  DULoxetine (CYMBALTA) 60 MG capsule Take 60 mg by mouth as needed.     [provider]  famotidine (PEPCID) 20 MG tablet Take 40 mg by mouth at bedtime. Patient not taking: Reported on 12/27/2019    [provider]  gabapentin (NEURONTIN) 300 MG capsule Take 300 mg by mouth 3 (three) times daily.    [provider]  hyoscyamine (LEVSIN SL) 0.125 MG SL tablet Place 2 tablets (0.25 mg total) under the tongue 4 (four) times daily -  before meals and at bedtime. Patient not taking: Reported on  07/14/2019 03/19/18   Roxan Hockey, MD  Linaclotide Rolan Lipa) 290 MCG CAPS capsule Take 290 mcg by mouth daily as needed.     [provider]  meclizine (ANTIVERT) 25 MG tablet Take 25 mg by mouth every 8 (eight) hours as needed for dizziness.    [provider]  metoCLOPramide (REGLAN) 5 MG tablet Take 1 tablet (5 mg total) by mouth in the morning, at noon, and at bedtime. 12/27/19   Jackquline Denmark, MD  metoprolol tartrate (LOPRESSOR) 25 MG tablet Take 1 tablet (25 mg total) by mouth 2 (two) times daily. Patient not taking: Reported on 12/27/2019 03/19/18   Roxan Hockey, MD  ondansetron (ZOFRAN ODT) 4 MG disintegrating tablet Take 1 tablet (4 mg total) by mouth every 8 (eight) hours as needed for nausea or vomiting. 12/27/19   Jackquline Denmark, MD  pantoprazole (PROTONIX) 40 MG tablet Take 1 tablet (40 mg total) by mouth 2 (two) times daily before a meal. Patient taking differently: Take 40 mg by mouth daily.  03/20/18   Roxan Hockey, MD  tiZANidine (ZANAFLEX) 4 MG tablet Take 4 mg by mouth as needed for muscle spasms. Patient not taking: Reported on 12/27/2019    [provider]    Allergies    Codeine and Lisinopril  Review of Systems   Review of Systems  Constitutional: Negative for fever.  HENT: Negative.   Respiratory: Negative.  Negative for cough and shortness of breath.   Cardiovascular: Negative.  Negative for chest pain.  Gastrointestinal: Positive for abdominal pain and nausea. Negative for constipation, diarrhea and vomiting.  Genitourinary: Negative.   Musculoskeletal: Negative.   Skin: Negative.   Neurological: Positive for weakness.    Physical Exam Updated Vital Signs BP 101/66 (BP Location: Left Arm)   Pulse 99   Temp 98.2 F (36.8 C) (Oral)   Resp 18   LMP  (LMP Unknown)   SpO2 98%   Physical Exam Vitals and nursing note reviewed.  Constitutional:      General: She is not in acute distress.    Appearance: She is  well-developed. She is not toxic-appearing.  HENT:     Head: Normocephalic.  Cardiovascular:     Rate and Rhythm: Normal rate.  Pulmonary:     Effort: Pulmonary effort is normal. No respiratory distress.  Abdominal:     General: Abdomen is flat. Bowel sounds are decreased. There is no distension.     Palpations: Abdomen is soft.     Tenderness: There is abdominal tenderness in the epigastric area.     Hernia: No hernia is present.  Skin:    General: Skin is warm and dry.  Neurological:     Mental Status: She is alert and oriented to person, place, and time.     ED Results / Procedures / Treatments  Labs (all labs ordered are listed, but only abnormal results are displayed) Labs Reviewed  COMPREHENSIVE METABOLIC PANEL - Abnormal; Notable for the following components:      Result Value   BUN 7 (*)    All other components within normal limits  LIPASE, BLOOD  CBC  URINALYSIS, ROUTINE W REFLEX MICROSCOPIC   Results for orders placed or performed during the hospital encounter of 05/07/20  Lipase, blood  Result Value Ref Range   Lipase 20 11 - 51 U/L  Comprehensive metabolic panel  Result Value Ref Range   Sodium 138 135 - 145 mmol/L   Potassium 3.9 3.5 - 5.1 mmol/L   Chloride 100 98 - 111 mmol/L   CO2 24 22 - 32 mmol/L   Glucose, Bld 94 70 - 99 mg/dL   BUN 7 (L) 8 - 23 mg/dL   Creatinine, Ser 0.80 0.44 - 1.00 mg/dL   Calcium 9.6 8.9 - 10.3 mg/dL   Total Protein 7.8 6.5 - 8.1 g/dL   Albumin 3.5 3.5 - 5.0 g/dL   AST 29 15 - 41 U/L   ALT 18 0 - 44 U/L   Alkaline Phosphatase 99 38 - 126 U/L   Total Bilirubin 0.8 0.3 - 1.2 mg/dL   GFR, Estimated >60 >60 mL/min   Anion gap 14 5 - 15  CBC  Result Value Ref Range   WBC 7.8 4.0 - 10.5 K/uL   RBC 4.59 3.87 - 5.11 MIL/uL   Hemoglobin 14.1 12.0 - 15.0 g/dL   HCT 44.2 36.0 - 46.0 %   MCV 96.3 80.0 - 100.0 fL   MCH 30.7 26.0 - 34.0 pg   MCHC 31.9 30.0 - 36.0 g/dL   RDW 12.9 11.5 - 15.5 %   Platelets 373 150 - 400 K/uL    nRBC 0.0 0.0 - 0.2 %    EKG EKG Interpretation  Date/Time:  Monday May 07 2020 10:10:28 EST Ventricular Rate:  93 PR Interval:  124 QRS Duration: 80 QT Interval:  346 QTC Calculation: 430 R Axis:   45 Text Interpretation: Normal sinus rhythm Biatrial enlargement Abnormal ECG No significant change since prior 8/16 Confirmed by Aletta Edouard [phone]) on 05/07/2020 10:25:27 PM   Radiology No results found.  Procedures Procedures (including critical care time)  Medications Ordered in ED Medications  pantoprazole (PROTONIX) injection 40 mg (has no administration in time range)  metoCLOPramide (REGLAN) injection 10 mg (has no administration in time range)  diphenhydrAMINE (BENADRYL) injection 12.5 mg (has no administration in time range)    ED Course  I have reviewed the triage vital signs and the nursing notes.  Pertinent labs & imaging results that were available during my care of the patient were reviewed by me and considered in my medical decision making (see chart for details).    MDM Rules/Calculators/A&P                          Patient to ED with epigastric pain, she reports, for the past month. Nausea without vomiting, no constipation or diarrhea.   Patient reports pain x 1 month. Chart reviewed. Apparently this has been a recurrent problem, being seen in the ED as far back as 12/2018. Was followed by GI prior to this for same ([place]). Note of 05/11/18:   "May have functional component/nonulcer dyspepsia/cannabis hyperemesis syndrome. Has been to multiple gastroenterologists at multiple centers."  It was noted at that time she had a negative EGD and  colon in the past. Gastric emptying study 07/28/19 negative. UGI 12/2018 revealed GERD to mid-esophagus, small hiatal hernia, no dysmotility.  VSS. She appears uncomfortable. Reglan, Benadryl and Protonix ordered. Labs unremarkable. Will reassess after medications. Do not feel imaging is going to help determine the  source of her chronic symptoms.   Medications given. On recheck, the patient is feeling much better. Discussed appropriate follow up with GI - she sees Melina Copa now in [place]. Reviewed medications with the patient and will make recommendations for BID Protonix, continue Reglan, and Bentyl. Dietary suggestions made for severe GERD.   Stable for discharge home.   Final Clinical Impression(s) / ED Diagnoses Final diagnoses:  None   1. Epigastric pain 2. GERD  Rx / DC Orders ED Discharge Orders    None       Dennie Bible [id number]    Delora Fuel, [place] [id number] [phone]

## 2020-05-07 NOTE — ED Notes (Signed)
Please contact son, Harle Battiest, [phone], with patient status.

## 2020-05-07 NOTE — ED Triage Notes (Signed)
Pt BIB [facility] for weakness, N/V x11 days. Seen at [facility] 1/1 for the same, dc'd home with zofran. zofran not helping.   BP 104/70 HR 90 95% RA CBG 160 97.8  RR 16

## 2020-05-07 NOTE — ED Triage Notes (Signed)
Pt c/o upper abd pain, symptoms have been op ongoing for 11 days. Pt asking for something to eat upon arrival to ED. Pt unable to keep food down d/t N/V

## 2020-05-08 NOTE — Discharge Instructions (Addendum)
You do not need to take your Metoprolol 25 mg until directed by your primary care doctor.   Continue atorvastatin for high cholesterol; gabapentin.  Increase your Protonix to twice daily dosing (in the morning and in the evening); continue dicyclomine and metoclopramide. You can also add Pepcid twice daily.  Call Dr. Melina Copa and schedule an appointment as soon as possible for further management.

## 2020-05-15 ENCOUNTER — Telehealth: Payer: Self-pay | Admitting: Oncology

## 2020-05-15 NOTE — Telephone Encounter (Signed)
Patient referred by Dr Nehemiah Settle for Transfer of Care for Neuroendocrin Tumor/carcinoid Tumor Colon.  Appt made for 05/28/2020 Labs 2:30 pm - Consult 3:00 pm

## 2020-05-28 ENCOUNTER — Inpatient Hospital Stay: Payer: Medicare Other | Admitting: Oncology

## 2020-05-28 ENCOUNTER — Telehealth: Payer: Self-pay | Admitting: Oncology

## 2020-05-28 ENCOUNTER — Other Ambulatory Visit: Payer: Self-pay | Admitting: Oncology

## 2020-05-28 ENCOUNTER — Encounter: Payer: Self-pay | Admitting: Oncology

## 2020-05-28 ENCOUNTER — Inpatient Hospital Stay: Payer: Medicare Other

## 2020-05-28 DIAGNOSIS — C7A012 Malignant carcinoid tumor of the ileum: Secondary | ICD-10-CM

## 2020-05-28 DIAGNOSIS — D518 Other vitamin B12 deficiency anemias: Secondary | ICD-10-CM

## 2020-05-28 NOTE — Telephone Encounter (Signed)
Per Dr Hinton Rao, patient rescheduled to 2/4 Labs 2:30 pm - Consult 3:00 pm  Ok per patient
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
## 2020-06-01 ENCOUNTER — Inpatient Hospital Stay: Payer: Medicare Other | Attending: Oncology

## 2020-06-01 ENCOUNTER — Encounter: Payer: Self-pay | Admitting: Oncology

## 2020-06-01 ENCOUNTER — Inpatient Hospital Stay (INDEPENDENT_AMBULATORY_CARE_PROVIDER_SITE_OTHER): Payer: Medicare Other | Admitting: Oncology

## 2020-06-01 ENCOUNTER — Other Ambulatory Visit: Payer: Self-pay

## 2020-06-01 ENCOUNTER — Other Ambulatory Visit: Payer: Self-pay | Admitting: Hematology and Oncology

## 2020-06-01 VITALS — BP 109/66 | HR 85 | Temp 99.1°F | Resp 18 | Ht 64.0 in | Wt 134.0 lb

## 2020-06-01 DIAGNOSIS — R109 Unspecified abdominal pain: Secondary | ICD-10-CM | POA: Insufficient documentation

## 2020-06-01 DIAGNOSIS — R110 Nausea: Secondary | ICD-10-CM | POA: Diagnosis not present

## 2020-06-01 DIAGNOSIS — G629 Polyneuropathy, unspecified: Secondary | ICD-10-CM | POA: Diagnosis not present

## 2020-06-01 DIAGNOSIS — Z87891 Personal history of nicotine dependence: Secondary | ICD-10-CM | POA: Diagnosis not present

## 2020-06-01 DIAGNOSIS — R1013 Epigastric pain: Secondary | ICD-10-CM | POA: Diagnosis not present

## 2020-06-01 DIAGNOSIS — Z85038 Personal history of other malignant neoplasm of large intestine: Secondary | ICD-10-CM | POA: Diagnosis present

## 2020-06-01 DIAGNOSIS — C7A012 Malignant carcinoid tumor of the ileum: Secondary | ICD-10-CM | POA: Diagnosis not present

## 2020-06-01 DIAGNOSIS — R6881 Early satiety: Secondary | ICD-10-CM | POA: Diagnosis not present

## 2020-06-01 DIAGNOSIS — R634 Abnormal weight loss: Secondary | ICD-10-CM | POA: Insufficient documentation

## 2020-06-01 DIAGNOSIS — Z79899 Other long term (current) drug therapy: Secondary | ICD-10-CM | POA: Diagnosis not present

## 2020-06-01 LAB — CBC AND DIFFERENTIAL
HCT: 35 — AB (ref 36–46)
Hemoglobin: 11.9 — AB (ref 12.0–16.0)
Neutrophils Absolute: 3.76
Platelets: 274 (ref 150–399)
WBC: 9.9

## 2020-06-01 LAB — COMPREHENSIVE METABOLIC PANEL
Albumin: 3.6 (ref 3.5–5.0)
Calcium: 9.1 (ref 8.7–10.7)

## 2020-06-01 LAB — HEPATIC FUNCTION PANEL
ALT: 13 (ref 7–35)
AST: 22 (ref 13–35)
Alkaline Phosphatase: 124 (ref 25–125)
Bilirubin, Total: 0.3

## 2020-06-01 LAB — CBC: RBC: 3.67 — AB (ref 3.87–5.11)

## 2020-06-01 LAB — BASIC METABOLIC PANEL
BUN: 16 (ref 4–21)
CO2: 26 — AB (ref 13–22)
Chloride: 104 (ref 99–108)
Creatinine: 0.7 (ref 0.5–1.1)
Glucose: 104
Potassium: 4.2 (ref 3.4–5.3)
Sodium: 135 — AB (ref 137–147)

## 2020-06-04 ENCOUNTER — Telehealth: Payer: Self-pay | Admitting: Family Medicine

## 2020-06-04 LAB — PROTEIN ELECTROPHORESIS, SERUM
A/G Ratio: 0.9 (ref 0.7–1.7)
Albumin ELP: 3.1 g/dL (ref 2.9–4.4)
Alpha-1-Globulin: 0.3 g/dL (ref 0.0–0.4)
Alpha-2-Globulin: 0.8 g/dL (ref 0.4–1.0)
Beta Globulin: 1.1 g/dL (ref 0.7–1.3)
Gamma Globulin: 1.4 g/dL (ref 0.4–1.8)
Globulin, Total: 3.5 g/dL (ref 2.2–3.9)
Total Protein ELP: 6.6 g/dL (ref 6.0–8.5)

## 2020-06-04 NOTE — Telephone Encounter (Signed)
Pt reports getting the pfizer vaccines (both) and will get her booster when it is time.

## 2020-06-05 ENCOUNTER — Encounter: Payer: Self-pay | Admitting: Oncology

## 2020-06-05 LAB — CHROMOGRANIN A: Chromogranin A (ng/mL): 262.3 ng/mL — ABNORMAL HIGH (ref 0.0–101.8)

## 2020-06-12 ENCOUNTER — Other Ambulatory Visit: Payer: Self-pay | Admitting: Oncology

## 2020-06-12 ENCOUNTER — Telehealth: Payer: Self-pay | Admitting: Oncology

## 2020-06-12 DIAGNOSIS — C7A012 Malignant carcinoid tumor of the ileum: Secondary | ICD-10-CM

## 2020-06-12 NOTE — Telephone Encounter (Signed)
06/12/20 spoke with patient and sched ct scans

## 2020-06-13 ENCOUNTER — Encounter: Payer: Self-pay | Admitting: Oncology

## 2020-06-21 ENCOUNTER — Telehealth: Payer: Self-pay | Admitting: Oncology

## 2020-06-21 NOTE — Telephone Encounter (Signed)
Informed patient that you will call her with 2/18 CT Scan results and we will notify her when we can schedule her treatment

## 2020-06-22 ENCOUNTER — Inpatient Hospital Stay: Payer: Medicare Other | Admitting: Oncology

## 2020-06-22 ENCOUNTER — Other Ambulatory Visit: Payer: Self-pay | Admitting: Pharmacist

## 2020-06-25 ENCOUNTER — Other Ambulatory Visit: Payer: Self-pay | Admitting: Oncology

## 2020-06-25 DIAGNOSIS — C7A012 Malignant carcinoid tumor of the ileum: Secondary | ICD-10-CM

## 2020-06-26 ENCOUNTER — Other Ambulatory Visit: Payer: Self-pay | Admitting: Oncology

## 2020-06-26 ENCOUNTER — Inpatient Hospital Stay: Payer: Medicare Other | Attending: Oncology

## 2020-06-26 ENCOUNTER — Other Ambulatory Visit: Payer: Self-pay

## 2020-06-26 VITALS — BP 101/65 | HR 66 | Temp 98.3°F | Resp 16 | Ht 64.0 in | Wt 140.0 lb

## 2020-06-26 DIAGNOSIS — Z79899 Other long term (current) drug therapy: Secondary | ICD-10-CM | POA: Diagnosis not present

## 2020-06-26 DIAGNOSIS — Z85038 Personal history of other malignant neoplasm of large intestine: Secondary | ICD-10-CM | POA: Insufficient documentation

## 2020-06-26 DIAGNOSIS — C7A012 Malignant carcinoid tumor of the ileum: Secondary | ICD-10-CM

## 2020-06-26 MED ORDER — OCTREOTIDE ACETATE 30 MG IM KIT
PACK | INTRAMUSCULAR | Status: AC
  Filled 2020-06-26: qty 1

## 2020-06-26 MED ORDER — OCTREOTIDE ACETATE 20 MG IM KIT: 20.0000 mg | PACK | Freq: Once | INTRAMUSCULAR | Status: DC

## 2020-06-26 MED ORDER — OCTREOTIDE ACETATE 30 MG IM KIT
30.0000 mg | PACK | Freq: Once | INTRAMUSCULAR | Status: AC
Start: 2020-06-26 — End: 2020-06-26
  Administered 2020-06-26: 30 mg via INTRAMUSCULAR

## 2020-06-26 NOTE — Patient Instructions (Signed)
Octreotide injection solution What is this medicine? OCTREOTIDE (ok TREE oh tide) is used to reduce blood levels of growth hormone in patients with a condition called acromegaly. This medicine also reduces flushing and watery diarrhea caused by certain types of cancer. This medicine may be used for other purposes; ask your health care provider or pharmacist if you have questions. COMMON BRAND NAME(S): Bynfezia, Sandostatin What should I tell my health care provider before I take this medicine? They need to know if you have any of these conditions:  diabetes  gallbladder disease  kidney disease  liver disease  thyroid disease  an unusual or allergic reaction to octreotide, other medicines, foods, dyes, or preservatives  pregnant or trying to get pregnant  breast-feeding How should I use this medicine? This medicine is for injection under the skin or into a vein (only in emergency situations). It is usually given by a health care professional in a hospital or clinic setting. If you get this medicine at home, you will be taught how to prepare and give this medicine. Allow the injection solution to come to room temperature before use. Do not warm it artificially. Use exactly as directed. Take your medicine at regular intervals. Do not take your medicine more often than directed. It is important that you put your used needles and syringes in a special sharps container. Do not put them in a trash can. If you do not have a sharps container, call your pharmacist or healthcare provider to get one. Talk to your pediatrician regarding the use of this medicine in children. Special care may be needed. Overdosage: If you think you have taken too much of this medicine contact a poison control center or emergency room at once. NOTE: This medicine is only for you. Do not share this medicine with others. What if I miss a dose? If you miss a dose, take it as soon as you can. If it is almost time for your  next dose, take only that dose. Do not take double or extra doses. What may interact with this medicine?  bromocriptine  certain medicines for blood pressure, heart disease, irregular heartbeat  cyclosporine  diuretics  medicines for diabetes, including insulin  quinidine This list may not describe all possible interactions. Give your health care provider a list of all the medicines, herbs, non-prescription drugs, or dietary supplements you use. Also tell them if you smoke, drink alcohol, or use illegal drugs. Some items may interact with your medicine. What should I watch for while using this medicine? Visit your doctor or health care professional for regular checks on your progress. To help reduce irritation at the injection site, use a different site for each injection and make sure the solution is at room temperature before use. This medicine may cause decreases in blood sugar. Signs of low blood sugar include chills, cool, pale skin or cold sweats, drowsiness, extreme hunger, fast heartbeat, headache, nausea, nervousness or anxiety, shakiness, trembling, unsteadiness, tiredness, or weakness. Contact your doctor or health care professional right away if you experience any of these symptoms. This medicine may increase blood sugar. Ask your healthcare provider if changes in diet or medicines are needed if you have diabetes. This medicine may cause a decrease in vitamin B12. You should make sure that you get enough vitamin B12 while you are taking this medicine. Discuss the foods you eat and the vitamins you take with your health care professional. What side effects may I notice from receiving this medicine? Side   effects that you should report to your doctor or health care professional as soon as possible:  allergic reactions like skin rash, itching or hives, swelling of the face, lips, or tongue  fast, slow, or irregular heartbeat  right upper belly pain  severe stomach pain  signs  and symptoms of high blood sugar such as being more thirsty or hungry or having to urinate more than normal. You may also feel very tired or have blurry vision.  signs and symptoms of low blood sugar such as feeling anxious; confusion; dizziness; increased hunger; unusually weak or tired; increased sweating; shakiness; cold, clammy skin; irritable; headache; blurred vision; fast heartbeat; loss of consciousness  unusually weak or tired Side effects that usually do not require medical attention (report to your doctor or health care professional if they continue or are bothersome):  diarrhea  dizziness  gas  headache  nausea, vomiting  pain, redness, or irritation at site where injected  upset stomach This list may not describe all possible side effects. Call your doctor for medical advice about side effects. You may report side effects to FDA at 1-800-FDA-1088. Where should I keep my medicine? Keep out of the reach of children. Store in a refrigerator between 2 and 8 degrees C (36 and 46 degrees F). Protect from light. Allow to come to room temperature naturally. Do not use artificial heat. If protected from light, the injection may be stored at room temperature between 20 and 30 degrees C (70 and 86 degrees F) for 14 days. After the initial use, throw away any unused portion of a multiple dose vial after 14 days. Throw away unused portions of the ampules after use. NOTE: This sheet is a summary. It may not cover all possible information. If you have questions about this medicine, talk to your doctor, pharmacist, or health care provider.  2021 Elsevier/Gold Standard (2018-11-11 13:33:09)  

## 2020-06-26 NOTE — Addendum Note (Signed)
Addended by: Juanetta Beets on: 06/26/2020 12:07 PM   Modules accepted: Orders

## 2020-06-27 ENCOUNTER — Telehealth: Payer: Self-pay

## 2020-06-27 NOTE — Telephone Encounter (Signed)
Pt called

## 2020-07-19 NOTE — Progress Notes (Signed)
[facility]  [address] [place],  [place]  [address] [phone]  Clinic Day:  07/23/2020  Referring physician: Barnetta Chapel, NP   This document serves as a record of services personally performed by Hosie Poisson, MD. It was created on their behalf by Curry,Lauren E, a trained medical scribe. The creation of this record is based on the scribe's personal observations and the provider's statements to them.   CHIEF COMPLAINT:  CC: History of neuroendocrine/carcinoid tumor  Current Treatment:  Surveillance   HISTORY OF PRESENT ILLNESS:  Kristi Romero is a 65 y.o. female referred by Dr. Melina Copa for a transfer of care and further management of her history of neuroendocrine and carcinoid tumor, diagnosed in October 2016.  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  She underwent surgical resection with Dr. 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  Seven of eighteen lymph nodes were involved (7/18).  Primary tumor measured 2.2 x 1.7 x 1.5 cm, with three apparent vascular metastases including a nodule up to 3 cm with metastatic tumor present within 1 mm of the inked radial margin.  In the distal appendix there was also a separate 3 mm carcinoid tumor.  This was a staged as a pT3pN1 with low mitotic rate (0 per 10 high powered fields).  She did require a PEG tube in 2017 for failure to thrive and this was removed in January 2018.  Her weight was up to 157 by September 2021.   Her last follow up was in January 2020 with Dr. Verl Blalock until I started seeing her in early February 2022.  Kristi Romero complained of weight loss, stomach  pain and nausea.  She was evaluated by Dr. Melina Copa with endoscopy in November 2021 which revealed chronic gastritis and was negative for H.Pylori.  No evidence of malignancy was observed.  She also notes bilateral numbness "from her mouth to her feet".   She receives B12 injections every 6 months.  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  Chromogranin A from February was elevated at 262.3, but CT scan showed bilateral adrenal adenomas but no evidence of progressive disease.  She was started on octreotide injection on March 1st.    INTERVAL HISTORY:  Kristi Romero is here for follow up prior to her next octreotide injection.  She continues to have hot flashes.  She notes numbness of the legs and feet which has been long standing.  She is in a wheelchair as she has shortness of breath with exertion.  She also notes some arthralgias.  She has had some intermitted pain under the right breast, but denies pain today.  Blood counts and chemistries are unremarkable.  Her  appetite is fair but she does have early satiety, but she has gained nearly 5 pounds since her last visit.  She denies fever, chills or other signs of infection.  She denies nausea, vomiting, bowel issues, or abdominal pain.  She denies sore throat, cough, dyspnea, or chest pain.  REVIEW OF SYSTEMS:  Review of Systems  Constitutional: Negative for appetite change, chills, fatigue, fever and unexpected weight change.  HENT:  Negative.  Occasions of mouth numbness  Eyes: Negative.   Respiratory: Positive for shortness of breath (with exertion, she remains in a wheelchair). Negative for chest tightness, cough, hemoptysis and wheezing.   Cardiovascular: Negative.  Negative for chest pain, leg swelling and palpitations.  Gastrointestinal: Negative for abdominal distention, abdominal pain, blood in stool, constipation, diarrhea, nausea and vomiting.  Endocrine: Positive for hot flashes.  Genitourinary:  Negative.  Negative for difficulty urinating, dysuria, frequency and hematuria.   Musculoskeletal: Positive for arthralgias. Negative for back pain, flank pain, gait problem and myalgias.  Skin: Negative.   Neurological: Positive for extremity weakness and numbness (of the feet). Negative for dizziness, gait problem, headaches, light-headedness, seizures and speech difficulty.  Hematological: Negative.   Psychiatric/Behavioral: Negative.  Negative for depression and sleep disturbance. The patient is not nervous/anxious.      VITALS:  Blood pressure (!) 109/57, pulse 66, temperature 98.2 F (36.8 C), temperature source Oral, resp. rate 18, height 5\' 4"  (1.626 m), weight 138 lb 11.2 oz (62.9 kg), SpO2 96 %.  Wt Readings from Last 3 Encounters:  07/23/20 138 lb 11.2 oz (62.9 kg)  06/26/20 140 lb (63.5 kg)  06/01/20 134 lb (60.8 kg)    Body mass index is 23.81 kg/m.  Performance status (ECOG): 2 - Symptomatic, <50% confined to bed  PHYSICAL EXAM:  Physical Exam Constitutional:      General: She is not in acute distress.    Appearance: Normal appearance. She is normal weight.  HENT:     Head: Normocephalic and atraumatic.  Eyes:     General: No scleral icterus.    Extraocular Movements: Extraocular movements intact.     Conjunctiva/sclera: Conjunctivae normal.     Pupils: Pupils are equal, round, and reactive to light.  Cardiovascular:     Rate and Rhythm: Normal rate and regular rhythm.     Pulses: Normal pulses.     Heart sounds: Normal heart sounds. No murmur heard. No friction rub. No gallop.   Pulmonary:     Effort: Pulmonary effort is normal. No respiratory distress.     Breath sounds: Normal breath sounds.  Abdominal:     General: Bowel sounds are normal. There is no distension.     Palpations: Abdomen is soft. There is no hepatomegaly, splenomegaly or mass.     Tenderness: There is no abdominal tenderness.  Musculoskeletal:        General: Normal range of motion.      Cervical back: Normal range of motion and neck supple.     Right lower leg: No edema.     Left lower leg: No edema.  Lymphadenopathy:     Cervical: No cervical adenopathy.  Skin:    General: Skin is warm and dry.  Neurological:     General: No focal deficit present.     Mental Status: She is alert and oriented to person, place, and time. Mental status is at baseline.  Psychiatric:        Mood and Affect: Mood normal.        Behavior: Behavior normal.        Thought Content: Thought content normal.        Judgment: Judgment normal.     LABS:   CBC Latest Ref Rng & Units 06/01/2020 05/07/2020 02/23/2019  WBC - 9.9 7.8 7.1  Hemoglobin 12.0 - 16.0 11.9(A) 14.1 12.6  Hematocrit 36 - 46 35(A) 44.2 38.1  Platelets 150 - 399 274 373 271.0   CMP Latest Ref  Rng & Units 06/01/2020 05/07/2020 02/23/2019  Glucose 70 - 99 mg/dL - 94 70  BUN 4 - 21 16 7(L) 8  Creatinine 0.5 - 1.1 0.7 0.80 0.74  Sodium 137 - 147 135(A) 138 140  Potassium 3.4 - 5.3 4.2 3.9 4.1  Chloride 99 - 108 104 100 105  CO2 13 - 22 26(A) 24 32  Calcium 8.7 - 10.7 9.1 9.6 9.2  Total Protein 6.5 - 8.1 g/dL - 7.8 7.4  Total Bilirubin 0.3 - 1.2 mg/dL - 0.8 0.3  Alkaline Phos 25 - 125 124 99 84  AST 13 - 35 22 29 12   ALT 7 - 35 13 18 9     STUDIES:   EXAM: 06/15/2020 CT CHEST, ABDOMEN, AND PELVIS WITH CONTRAST  TECHNIQUE: Multidetector CT imaging of the chest, abdomen and pelvis was performed following the standard protocol during bolus administration of intravenous contrast.  CONTRAST:  100 cc of Isovue 370  COMPARISON:  03/11/2018 and 02/25/2019.  FINDINGS: CT CHEST FINDINGS  Cardiovascular: The heart size appears within normal limits. Aortic atherosclerosis. No pericardial effusion.  Mediastinum/Nodes: Bilateral thyroid nodules. The largest is in the left lobe measuring 1.3 cm, image 5/2. Not clinically significant; no follow-up imaging recommended (ref: J Am Coll Radiol. 2015 Feb;12(2): 143-50). The  trachea appears patent. Normal appearance of the esophagus. There is no axillary, supraclavicular, mediastinal, or hilar adenopathy.  Lungs/Pleura: No free fluid or fluid collections. No airspace consolidation or atelectasis. No focal pulmonary nodule or mass identified.  Musculoskeletal: No chest wall mass or suspicious bone lesions identified.  CT ABDOMEN PELVIS FINDINGS  Hepatobiliary: No focal liver abnormality is seen. Status post cholecystectomy. No biliary dilatation.  Pancreas: Unremarkable. No pancreatic ductal dilatation or surrounding inflammatory changes.  Spleen: Normal in size without focal abnormality.  Adrenals/Urinary Tract: Left adrenal gland adenoma is unchanged from 2019 measuring 1.7 x 2.1 cm. The kidneys are unremarkable. No mass or hydronephrosis identified. The urinary bladder appears normal.  Stomach/Bowel: The stomach appears normal. Postop change from right hemicolectomy with enterocolonic anastomosis. There is no bowel wall thickening, inflammation, or distension.  Vascular/Lymphatic: Aortic atherosclerosis. No aneurysm. No abdominopelvic adenopathy identified  Reproductive: Status post hysterectomy. No adnexal masses.  Other: No free fluid or fluid collections identified within the abdomen or pelvis.  Musculoskeletal: No acute or significant osseous findings. Degenerative disc disease noted. This is most advanced at L5-S1.  IMPRESSION: 1. Stable CT of the chest, abdomen and pelvis. No findings to suggest residual or recurrent tumor or metastatic disease. 2. Stable left adrenal gland adenoma. 3. Aortic atherosclerosis.   HISTORY:   Allergies:  Allergies  Allergen Reactions  . Codeine Nausea Only  . Lisinopril Swelling    Current Medications: Current Outpatient Medications  Medication Sig Dispense Refill  . atorvastatin (LIPITOR) 80 MG tablet Take 80 mg by mouth daily.    . baclofen (LIORESAL) 10 MG tablet Take 10 mg by mouth 2  (two) times daily.    Marland Kitchen dicyclomine (BENTYL) 20 MG tablet SMARTSIG:1 Tablet(s) By Mouth Morning-Evening    . DULoxetine (CYMBALTA) 60 MG capsule Take 60 mg by mouth as needed.     . famotidine (PEPCID) 20 MG tablet Take 40 mg by mouth at bedtime. (Patient not taking: Reported on 12/27/2019)    . gabapentin (NEURONTIN) 600 MG tablet Take 600 mg by mouth 3 (three) times daily.    Marland Kitchen linaclotide (LINZESS) 290 MCG CAPS capsule Take 290 mcg by mouth daily as needed.     Marland Kitchen  meclizine (ANTIVERT) 25 MG tablet Take 25 mg by mouth every 8 (eight) hours as needed for dizziness.    . metoCLOPramide (REGLAN) 5 MG tablet Take 1 tablet (5 mg total) by mouth in the morning, at noon, and at bedtime. 90 tablet 0  . metoprolol tartrate (LOPRESSOR) 25 MG tablet Take 1 tablet (25 mg total) by mouth 2 (two) times daily. 60 tablet 2  . ondansetron (ZOFRAN ODT) 4 MG disintegrating tablet Take 1 tablet (4 mg total) by mouth every 8 (eight) hours as needed for nausea or vomiting. 40 tablet 0  . pantoprazole (PROTONIX) 40 MG tablet Take 1 tablet (40 mg total) by mouth 2 (two) times daily before a meal. (Patient taking differently: Take 40 mg by mouth daily.) 60 tablet 2  . sucralfate (CARAFATE) 1 g tablet Take 1 g by mouth 4 (four) times daily.    Marland Kitchen tiZANidine (ZANAFLEX) 4 MG tablet Take 4 mg by mouth as needed for muscle spasms.     No current facility-administered medications for this visit.     ASSESSMENT & PLAN:   Assessment:   1.  History of neuroendocrine/carcinoid tumor of the ascending colon, diagnosed in October 2016, treated with surgical resection.  Seven of eighteen lymph nodes were involved (7/18).  Primary tumor measured 2.2 x 1.7 x 1.5 cm, with three apparent vascular metastases including a nodule up to 3 cm with metastatic tumor present within 1 mm of the inked radial margin.  In the distal appendix there was also a separate 3 mm carcinoid tumor.  This was a staged as a pT3pN1 with low mitotic rate (0 per 10  high powered fields).  [name] was sporadic.  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  24 hour urine for 5HIAA was normal.  Chromogranin A from February was elevated at 262.3, but CT imaging was stable with no evidence of residual or recurrent disease.  She was placed on monthly octreotide injections in March.    2.  Weight loss, abdominal pain and nausea.  She states that her appetite is fair, but has early satiety.  She has gained nearly 5 pounds.  3.  Mildly elevated total protein, up to 8.3 in October 2021. SPEP was unremarkable.   4.  Resection of the terminal ileum and so she likely cannot absorb B12.  She is on injections every 6 months.  Her last level was in a good range.  Plan: She started monthly octreotide injections on March 1st and will receive her next dose today.  As she has limited transportation, we will schedule her injections the same day as her appointments.  We will bring her back in 4 weeks with CBC, CMP and Chromogranin A for repeat evaluation prior to her next octreotide.  She and her aid understand and agree with this plan of care.  I have answered their questions and they know to call with any concerns.   I provided 20 minutes of face-to-face time during this this encounter and > 50% was spent counseling as documented under my assessment and plan.    Derwood Kaplan, MD [facility] [address] [place] [address] Dept: [phone] Dept Fax: [phone]   I, Rita Ohara, am acting as scribe for Derwood Kaplan, MD  I have reviewed this report as typed by the medical scribe, and it  is complete and accurate.  Hermina Barters

## 2020-07-23 ENCOUNTER — Encounter: Payer: Self-pay | Admitting: Oncology

## 2020-07-23 ENCOUNTER — Inpatient Hospital Stay: Payer: Medicare Other

## 2020-07-23 ENCOUNTER — Inpatient Hospital Stay (INDEPENDENT_AMBULATORY_CARE_PROVIDER_SITE_OTHER): Payer: Medicare Other | Admitting: Oncology

## 2020-07-23 ENCOUNTER — Other Ambulatory Visit: Payer: Self-pay

## 2020-07-23 ENCOUNTER — Other Ambulatory Visit: Payer: Self-pay | Admitting: Hematology and Oncology

## 2020-07-23 ENCOUNTER — Other Ambulatory Visit: Payer: Self-pay | Admitting: Oncology

## 2020-07-23 VITALS — BP 102/67 | HR 64 | Temp 98.4°F | Resp 18 | Ht 64.0 in | Wt 134.5 lb

## 2020-07-23 VITALS — BP 109/57 | HR 66 | Temp 98.2°F | Resp 18 | Ht 64.0 in | Wt 138.7 lb

## 2020-07-23 DIAGNOSIS — C7A012 Malignant carcinoid tumor of the ileum: Secondary | ICD-10-CM

## 2020-07-23 DIAGNOSIS — Z85038 Personal history of other malignant neoplasm of large intestine: Secondary | ICD-10-CM | POA: Diagnosis not present

## 2020-07-23 LAB — HEPATIC FUNCTION PANEL
ALT: 9 (ref 7–35)
AST: 20 (ref 13–35)
Alkaline Phosphatase: 88 (ref 25–125)
Bilirubin, Total: 0.2

## 2020-07-23 LAB — BASIC METABOLIC PANEL
BUN: 9 (ref 4–21)
CO2: 36 — AB (ref 13–22)
Chloride: 103 (ref 99–108)
Creatinine: 0.7 (ref 0.5–1.1)
Glucose: 107
Potassium: 4.6 (ref 3.4–5.3)
Sodium: 139 (ref 137–147)

## 2020-07-23 LAB — CBC AND DIFFERENTIAL
HCT: 39 (ref 36–46)
Hemoglobin: 13 (ref 12.0–16.0)
Neutrophils Absolute: 5.36
Platelets: 223 (ref 150–399)
WBC: 10.3

## 2020-07-23 LAB — COMPREHENSIVE METABOLIC PANEL
Albumin: 4.2 (ref 3.5–5.0)
Calcium: 9.7 (ref 8.7–10.7)

## 2020-07-23 LAB — CBC: RBC: 4.03 (ref 3.87–5.11)

## 2020-07-23 MED ORDER — OCTREOTIDE ACETATE 20 MG IM KIT
20.0000 mg | PACK | Freq: Once | INTRAMUSCULAR | Status: AC
  Administered 2020-07-23: 20 mg via INTRAMUSCULAR

## 2020-07-23 MED ORDER — OCTREOTIDE ACETATE 20 MG IM KIT
PACK | INTRAMUSCULAR | Status: AC
  Filled 2020-07-23: qty 1

## 2020-07-23 NOTE — Patient Instructions (Signed)
Octreotide injection solution What is this medicine? OCTREOTIDE (ok TREE oh tide) is used to reduce blood levels of growth hormone in patients with a condition called acromegaly. This medicine also reduces flushing and watery diarrhea caused by certain types of cancer. This medicine may be used for other purposes; ask your health care provider or pharmacist if you have questions. COMMON BRAND NAME(S): Bynfezia, Sandostatin What should I tell my health care provider before I take this medicine? They need to know if you have any of these conditions:  diabetes  gallbladder disease  kidney disease  liver disease  thyroid disease  an unusual or allergic reaction to octreotide, other medicines, foods, dyes, or preservatives  pregnant or trying to get pregnant  breast-feeding How should I use this medicine? This medicine is for injection under the skin or into a vein (only in emergency situations). It is usually given by a health care professional in a hospital or clinic setting. If you get this medicine at home, you will be taught how to prepare and give this medicine. Allow the injection solution to come to room temperature before use. Do not warm it artificially. Use exactly as directed. Take your medicine at regular intervals. Do not take your medicine more often than directed. It is important that you put your used needles and syringes in a special sharps container. Do not put them in a trash can. If you do not have a sharps container, call your pharmacist or healthcare provider to get one. Talk to your pediatrician regarding the use of this medicine in children. Special care may be needed. Overdosage: If you think you have taken too much of this medicine contact a poison control center or emergency room at once. NOTE: This medicine is only for you. Do not share this medicine with others. What if I miss a dose? If you miss a dose, take it as soon as you can. If it is almost time for your  next dose, take only that dose. Do not take double or extra doses. What may interact with this medicine?  bromocriptine  certain medicines for blood pressure, heart disease, irregular heartbeat  cyclosporine  diuretics  medicines for diabetes, including insulin  quinidine This list may not describe all possible interactions. Give your health care provider a list of all the medicines, herbs, non-prescription drugs, or dietary supplements you use. Also tell them if you smoke, drink alcohol, or use illegal drugs. Some items may interact with your medicine. What should I watch for while using this medicine? Visit your doctor or health care professional for regular checks on your progress. To help reduce irritation at the injection site, use a different site for each injection and make sure the solution is at room temperature before use. This medicine may cause decreases in blood sugar. Signs of low blood sugar include chills, cool, pale skin or cold sweats, drowsiness, extreme hunger, fast heartbeat, headache, nausea, nervousness or anxiety, shakiness, trembling, unsteadiness, tiredness, or weakness. Contact your doctor or health care professional right away if you experience any of these symptoms. This medicine may increase blood sugar. Ask your healthcare provider if changes in diet or medicines are needed if you have diabetes. This medicine may cause a decrease in vitamin B12. You should make sure that you get enough vitamin B12 while you are taking this medicine. Discuss the foods you eat and the vitamins you take with your health care professional. What side effects may I notice from receiving this medicine? Side   effects that you should report to your doctor or health care professional as soon as possible:  allergic reactions like skin rash, itching or hives, swelling of the face, lips, or tongue  fast, slow, or irregular heartbeat  right upper belly pain  severe stomach pain  signs  and symptoms of high blood sugar such as being more thirsty or hungry or having to urinate more than normal. You may also feel very tired or have blurry vision.  signs and symptoms of low blood sugar such as feeling anxious; confusion; dizziness; increased hunger; unusually weak or tired; increased sweating; shakiness; cold, clammy skin; irritable; headache; blurred vision; fast heartbeat; loss of consciousness  unusually weak or tired Side effects that usually do not require medical attention (report to your doctor or health care professional if they continue or are bothersome):  diarrhea  dizziness  gas  headache  nausea, vomiting  pain, redness, or irritation at site where injected  upset stomach This list may not describe all possible side effects. Call your doctor for medical advice about side effects. You may report side effects to FDA at 1-800-FDA-1088. Where should I keep my medicine? Keep out of the reach of children. Store in a refrigerator between 2 and 8 degrees C (36 and 46 degrees F). Protect from light. Allow to come to room temperature naturally. Do not use artificial heat. If protected from light, the injection may be stored at room temperature between 20 and 30 degrees C (70 and 86 degrees F) for 14 days. After the initial use, throw away any unused portion of a multiple dose vial after 14 days. Throw away unused portions of the ampules after use. NOTE: This sheet is a summary. It may not cover all possible information. If you have questions about this medicine, talk to your doctor, pharmacist, or health care provider.  2021 Elsevier/Gold Standard (2018-11-11 13:33:09)  

## 2020-07-23 NOTE — Progress Notes (Signed)
1055: PT STABLE AT TIME OF DISCHARGE  

## 2020-08-17 ENCOUNTER — Telehealth: Payer: Self-pay | Admitting: Hematology and Oncology

## 2020-08-17 NOTE — Telephone Encounter (Signed)
Patient called to verify 4/25 Appt's 

## 2020-08-20 ENCOUNTER — Inpatient Hospital Stay: Payer: Medicare HMO

## 2020-08-20 ENCOUNTER — Telehealth: Payer: Self-pay | Admitting: Hematology and Oncology

## 2020-08-20 ENCOUNTER — Inpatient Hospital Stay: Payer: Medicare HMO | Attending: Oncology | Admitting: Hematology and Oncology

## 2020-08-20 ENCOUNTER — Encounter: Payer: Self-pay | Admitting: Hematology and Oncology

## 2020-08-20 ENCOUNTER — Other Ambulatory Visit: Payer: Self-pay

## 2020-08-20 VITALS — BP 106/68 | HR 77 | Temp 98.6°F | Resp 18 | Ht 64.0 in | Wt 128.7 lb

## 2020-08-20 VITALS — BP 108/75 | HR 80 | Temp 98.0°F | Resp 18 | Ht 64.0 in | Wt 128.8 lb

## 2020-08-20 DIAGNOSIS — C7A012 Malignant carcinoid tumor of the ileum: Secondary | ICD-10-CM

## 2020-08-20 DIAGNOSIS — E340 Carcinoid syndrome: Secondary | ICD-10-CM | POA: Insufficient documentation

## 2020-08-20 LAB — HEPATIC FUNCTION PANEL
ALT: 12 (ref 7–35)
AST: 23 (ref 13–35)
Alkaline Phosphatase: 101 (ref 25–125)
Bilirubin, Total: 0.6

## 2020-08-20 LAB — CBC AND DIFFERENTIAL
HCT: 42 (ref 36–46)
Hemoglobin: 14.3 (ref 12.0–16.0)
Neutrophils Absolute: 2.94
Platelets: 231 (ref 150–399)
WBC: 8.9

## 2020-08-20 LAB — BASIC METABOLIC PANEL
BUN: 7 (ref 4–21)
CO2: 31 — AB (ref 13–22)
Chloride: 100 (ref 99–108)
Creatinine: 0.9 (ref 0.5–1.1)
Glucose: 148
Potassium: 2.8 — AB (ref 3.4–5.3)
Sodium: 136 — AB (ref 137–147)

## 2020-08-20 LAB — CBC: RBC: 4.47 (ref 3.87–5.11)

## 2020-08-20 LAB — COMPREHENSIVE METABOLIC PANEL
Albumin: 4.3 (ref 3.5–5.0)
Calcium: 9.8 (ref 8.7–10.7)

## 2020-08-20 MED ORDER — OCTREOTIDE ACETATE 20 MG IM KIT
20.0000 mg | PACK | Freq: Once | INTRAMUSCULAR | Status: AC
  Administered 2020-08-20: 20 mg via INTRAMUSCULAR

## 2020-08-20 MED ORDER — POTASSIUM CHLORIDE CRYS ER 20 MEQ PO TBCR: 20.0000 meq | EXTENDED_RELEASE_TABLET | Freq: Three times a day (TID) | ORAL | 0 refills | Status: DC

## 2020-08-20 MED ORDER — OCTREOTIDE ACETATE 20 MG IM KIT
PACK | INTRAMUSCULAR | Status: AC
  Filled 2020-08-20: qty 1

## 2020-08-20 NOTE — Telephone Encounter (Signed)
Per 4/25 LOS, patient scheduled for May Appt's.  Gave patient Appt Summary

## 2020-08-20 NOTE — Progress Notes (Signed)
[facility]  [address] [place],  [place]  [address] [phone]  Clinic Day:  08/20/2020  Referring physician: Barnetta Chapel, NP    CHIEF COMPLAINT:  CC: A 65 year old female with history of neuroendocrine/carcinoid tumor here for four week evaluation prior to octreotide injection  Current Treatment:  Surveillance   HISTORY OF PRESENT ILLNESS:  Kristi Romero is a 65 y.o. female referred by Dr. Melina Copa for a transfer of care and further management of her history of neuroendocrine and carcinoid tumor, diagnosed in October 2016.  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  She underwent surgical resection with Dr. 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  Seven of eighteen lymph nodes were involved (7/18).  Primary tumor measured 2.2 x 1.7 x 1.5 cm, with three apparent vascular metastases including a nodule up to 3 cm with metastatic tumor present within 1 mm of the inked radial margin.  In the distal appendix there was also a separate 3 mm carcinoid tumor.  This was a staged as a pT3pN1 with low mitotic rate (0 per 10 high powered fields).  She did require a PEG tube in 2017 for failure to thrive and this was removed in January 2018.  Her weight was up to 157 by September 2021.   Her last follow up was in January 2020 with Dr. Verl Blalock until we started seeing her in early February 2022.  Kristi Romero complained of weight loss, stomach pain and nausea.  She was evaluated by Dr. Melina Copa with endoscopy in November 2021 which revealed chronic gastritis and was negative for H.Pylori.  No evidence of malignancy was observed.  She also  notes bilateral numbness "from her mouth to her feet".   She receives B12 injections every 6 months.  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  Chromogranin A from February was elevated at 262.3, but CT scan showed bilateral adrenal adenomas but no evidence of progressive disease.  She was started on octreotide injection on March 1st.    INTERVAL HISTORY:  Kristi Romero is here for evaluation prior to her next octreotide injection.  She continues to have hot flashes. She continues to have pain in her upper abdomen that she rates as 8/10. She does not take anything for the pain. She will follow up with Dr. Melina Copa for colonoscopy on 5/11. She denies fever, chills, nausea or vomiting. She denies issue with bowel or bladder. She has mild shortness of breath upon exertion and denies cough or chest pain. CBC today is unremarkable. CMP today reveals potassium 2.8.  REVIEW OF SYSTEMS:  Review of Systems  Constitutional: Positive for appetite change. Negative for chills, diaphoresis, fatigue, fever and unexpected weight change.  HENT:   Negative for hearing loss, lump/mass, mouth sores, nosebleeds, sore throat, tinnitus, trouble swallowing and voice change.   Eyes: Negative for eye problems and icterus.  Respiratory: Positive for shortness of breath. Negative for chest tightness, cough, hemoptysis and wheezing.   Cardiovascular: Negative for chest pain, leg swelling and palpitations.  Gastrointestinal: Negative for abdominal distention, abdominal pain, blood in stool, constipation, diarrhea, nausea, rectal pain and vomiting.  Endocrine: Negative for hot flashes.  Genitourinary: Negative for bladder incontinence, difficulty urinating, dyspareunia, dysuria, frequency, hematuria and nocturia.   Musculoskeletal: Negative for arthralgias, back pain, flank pain, gait problem, myalgias, neck pain and neck stiffness.  Skin: Negative for itching, rash and wound.  Neurological:  Negative for dizziness, extremity weakness, gait problem, headaches, light-headedness, numbness, seizures and speech difficulty.  Hematological: Negative for adenopathy. Does not bruise/bleed easily.  Psychiatric/Behavioral: Negative for confusion, decreased concentration, depression, sleep disturbance and suicidal ideas. The patient is not nervous/anxious.      VITALS:  There were no vitals taken for this visit.  Wt Readings from Last 3 Encounters:  07/23/20 134 lb 8 oz (61 kg)  07/23/20 138 lb 11.2 oz (62.9 kg)  06/26/20 140 lb (63.5 kg)    There is no height or weight on file to calculate BMI.  Performance status (ECOG): 2 - Symptomatic, <50% confined to bed  PHYSICAL EXAM:  Physical Exam Constitutional:      General: She is not in acute distress.    Appearance: Normal appearance. She is normal weight.  HENT:     Head: Normocephalic and atraumatic.  Eyes:     General: No scleral icterus.    Extraocular Movements: Extraocular movements intact.     Conjunctiva/sclera: Conjunctivae normal.     Pupils: Pupils are equal, round, and reactive to light.  Cardiovascular:     Rate and Rhythm: Normal rate and regular rhythm.     Pulses: Normal pulses.     Heart sounds: Normal heart sounds. No murmur heard. No friction rub. No gallop.   Pulmonary:     Effort: Pulmonary effort is normal. No respiratory distress.     Breath sounds: Normal breath sounds.  Abdominal:     General: Bowel sounds are normal. There is no distension.     Palpations: Abdomen is soft. There is no hepatomegaly, splenomegaly or mass.     Tenderness: There is no abdominal tenderness.  Musculoskeletal:        General: Normal range of motion.     Cervical back: Normal range of motion and neck supple.     Right lower leg: No edema.     Left lower leg: No edema.  Lymphadenopathy:     Cervical: No cervical adenopathy.  Skin:    General: Skin is warm and dry.  Neurological:     General: No focal deficit present.      Mental Status: She is alert and oriented to person, place, and time. Mental status is at baseline.  Psychiatric:        Mood and Affect: Mood normal.        Behavior: Behavior normal.        Thought Content: Thought content normal.        Judgment: Judgment normal.     LABS:   CBC Latest Ref Rng & Units 07/23/2020 06/01/2020 05/07/2020  WBC - 10.3 9.9 7.8  Hemoglobin 12.0 - 16.0 13.0 11.9(A) 14.1  Hematocrit 36 - 46 39 35(A) 44.2  Platelets 150 - 399 223 274 373   CMP Latest Ref Rng & Units 07/23/2020 06/01/2020 05/07/2020  Glucose 70 - 99 mg/dL - - 94  BUN 4 - 21 9 16  7(L)  Creatinine 0.5 - 1.1 0.7 0.7 0.80  Sodium 137 - 147 139 135(A) 138  Potassium 3.4 - 5.3 4.6 4.2 3.9  Chloride 99 - 108 103 104 100  CO2 13 - 22 36(A) 26(A) 24  Calcium 8.7 - 10.7 9.7 9.1 9.6  Total  Protein 6.5 - 8.1 g/dL - - 7.8  Total Bilirubin 0.3 - 1.2 mg/dL - - 0.8  Alkaline Phos 25 - 125 88 124 99  AST 13 - 35 20 22 29   ALT 7 - 35 9 13 18     STUDIES:     HISTORY:   Allergies:  Allergies  Allergen Reactions  . Codeine Nausea Only  . Lisinopril Swelling    Current Medications: Current Outpatient Medications  Medication Sig Dispense Refill  . atorvastatin (LIPITOR) 80 MG tablet Take 80 mg by mouth daily.    . baclofen (LIORESAL) 10 MG tablet Take 10 mg by mouth 2 (two) times daily.    Marland Kitchen dicyclomine (BENTYL) 20 MG tablet SMARTSIG:1 Tablet(s) By Mouth Morning-Evening    . DULoxetine (CYMBALTA) 60 MG capsule Take 60 mg by mouth as needed.     . famotidine (PEPCID) 20 MG tablet Take 40 mg by mouth at bedtime. (Patient not taking: Reported on 12/27/2019)    . gabapentin (NEURONTIN) 600 MG tablet Take 600 mg by mouth 3 (three) times daily.    Marland Kitchen linaclotide (LINZESS) 290 MCG CAPS capsule Take 290 mcg by mouth daily as needed.     . meclizine (ANTIVERT) 25 MG tablet Take 25 mg by mouth every 8 (eight) hours as needed for dizziness.    . metoCLOPramide (REGLAN) 5 MG tablet Take 1 tablet (5 mg total)  by mouth in the morning, at noon, and at bedtime. 90 tablet 0  . metoprolol tartrate (LOPRESSOR) 25 MG tablet Take 1 tablet (25 mg total) by mouth 2 (two) times daily. 60 tablet 2  . ondansetron (ZOFRAN ODT) 4 MG disintegrating tablet Take 1 tablet (4 mg total) by mouth every 8 (eight) hours as needed for nausea or vomiting. 40 tablet 0  . pantoprazole (PROTONIX) 40 MG tablet Take 1 tablet (40 mg total) by mouth 2 (two) times daily before a meal. (Patient taking differently: Take 40 mg by mouth daily.) 60 tablet 2  . sucralfate (CARAFATE) 1 g tablet Take 1 g by mouth 4 (four) times daily.    Marland Kitchen tiZANidine (ZANAFLEX) 4 MG tablet Take 4 mg by mouth as needed for muscle spasms.     No current facility-administered medications for this visit.     ASSESSMENT & PLAN:   Assessment:   1.  History of neuroendocrine/carcinoid tumor of the ascending colon, diagnosed in October 2016, treated with surgical resection.  Seven of eighteen lymph nodes were involved (7/18).  Primary tumor measured 2.2 x 1.7 x 1.5 cm, with three apparent vascular metastases including a nodule up to 3 cm with metastatic tumor present within 1 mm of the inked radial margin.  In the distal appendix there was also a separate 3 mm carcinoid tumor.  This was a staged as a pT3pN1 with low mitotic rate (0 per 10 high powered fields).  [name] was sporadic.  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  24 hour urine for 5HIAA was normal.  Chromogranin A from February was elevated at 262.3, but CT imaging was stable with no evidence of residual or recurrent disease.  She was placed on monthly octreotide injections in March.  She will continue with her next scheduled injection today. She is scheduled for colonoscopy with Dr. Melina Copa 09/05/2020  2.  Weight loss, abdominal pain and nausea.  She states that her appetite is fair, but has early  satiety.  She has  gained nearly 6 pounds.  3. Hypokalemia. She will try oral supplement as she does not want to have IV infusions right now.   Plan: She started monthly octreotide injections on March 1st and will receive her next dose today.  As she has limited transportation, we will schedule her injections the same day as her appointments.  We will bring her back in 4 weeks with CBC, CMP and Chromogranin A for repeat evaluation prior to her next octreotide. She will start potassium supplement today.   Both her and her aid verbalize understanding of and agreement to the plans discussed today. They know to call the office should any new questions or concerns arise.   [name], NP [facility] AT [facility] [address] [place] [address] Dept: [phone] Dept Fax: [phone]

## 2020-08-20 NOTE — Patient Instructions (Signed)
Octreotide injection solution What is this medicine? OCTREOTIDE (ok TREE oh tide) is used to reduce blood levels of growth hormone in patients with a condition called acromegaly. This medicine also reduces flushing and watery diarrhea caused by certain types of cancer. This medicine may be used for other purposes; ask your health care provider or pharmacist if you have questions. COMMON BRAND NAME(S): Bynfezia, Sandostatin What should I tell my health care provider before I take this medicine? They need to know if you have any of these conditions:  diabetes  gallbladder disease  kidney disease  liver disease  thyroid disease  an unusual or allergic reaction to octreotide, other medicines, foods, dyes, or preservatives  pregnant or trying to get pregnant  breast-feeding How should I use this medicine? This medicine is for injection under the skin or into a vein (only in emergency situations). It is usually given by a health care professional in a hospital or clinic setting. If you get this medicine at home, you will be taught how to prepare and give this medicine. Allow the injection solution to come to room temperature before use. Do not warm it artificially. Use exactly as directed. Take your medicine at regular intervals. Do not take your medicine more often than directed. It is important that you put your used needles and syringes in a special sharps container. Do not put them in a trash can. If you do not have a sharps container, call your pharmacist or healthcare provider to get one. Talk to your pediatrician regarding the use of this medicine in children. Special care may be needed. Overdosage: If you think you have taken too much of this medicine contact a poison control center or emergency room at once. NOTE: This medicine is only for you. Do not share this medicine with others. What if I miss a dose? If you miss a dose, take it as soon as you can. If it is almost time for your  next dose, take only that dose. Do not take double or extra doses. What may interact with this medicine?  bromocriptine  certain medicines for blood pressure, heart disease, irregular heartbeat  cyclosporine  diuretics  medicines for diabetes, including insulin  quinidine This list may not describe all possible interactions. Give your health care provider a list of all the medicines, herbs, non-prescription drugs, or dietary supplements you use. Also tell them if you smoke, drink alcohol, or use illegal drugs. Some items may interact with your medicine. What should I watch for while using this medicine? Visit your doctor or health care professional for regular checks on your progress. To help reduce irritation at the injection site, use a different site for each injection and make sure the solution is at room temperature before use. This medicine may cause decreases in blood sugar. Signs of low blood sugar include chills, cool, pale skin or cold sweats, drowsiness, extreme hunger, fast heartbeat, headache, nausea, nervousness or anxiety, shakiness, trembling, unsteadiness, tiredness, or weakness. Contact your doctor or health care professional right away if you experience any of these symptoms. This medicine may increase blood sugar. Ask your healthcare provider if changes in diet or medicines are needed if you have diabetes. This medicine may cause a decrease in vitamin B12. You should make sure that you get enough vitamin B12 while you are taking this medicine. Discuss the foods you eat and the vitamins you take with your health care professional. What side effects may I notice from receiving this medicine? Side   effects that you should report to your doctor or health care professional as soon as possible:  allergic reactions like skin rash, itching or hives, swelling of the face, lips, or tongue  fast, slow, or irregular heartbeat  right upper belly pain  severe stomach pain  signs  and symptoms of high blood sugar such as being more thirsty or hungry or having to urinate more than normal. You may also feel very tired or have blurry vision.  signs and symptoms of low blood sugar such as feeling anxious; confusion; dizziness; increased hunger; unusually weak or tired; increased sweating; shakiness; cold, clammy skin; irritable; headache; blurred vision; fast heartbeat; loss of consciousness  unusually weak or tired Side effects that usually do not require medical attention (report to your doctor or health care professional if they continue or are bothersome):  diarrhea  dizziness  gas  headache  nausea, vomiting  pain, redness, or irritation at site where injected  upset stomach This list may not describe all possible side effects. Call your doctor for medical advice about side effects. You may report side effects to FDA at 1-800-FDA-1088. Where should I keep my medicine? Keep out of the reach of children. Store in a refrigerator between 2 and 8 degrees C (36 and 46 degrees F). Protect from light. Allow to come to room temperature naturally. Do not use artificial heat. If protected from light, the injection may be stored at room temperature between 20 and 30 degrees C (70 and 86 degrees F) for 14 days. After the initial use, throw away any unused portion of a multiple dose vial after 14 days. Throw away unused portions of the ampules after use. NOTE: This sheet is a summary. It may not cover all possible information. If you have questions about this medicine, talk to your doctor, pharmacist, or health care provider.  2021 Elsevier/Gold Standard (2018-11-11 13:33:09)  

## 2020-08-23 LAB — CHROMOGRANIN A: Chromogranin A (ng/mL): 40.1 ng/mL (ref 0.0–101.8)

## 2020-09-05 HISTORY — PX: COLONOSCOPY: SHX5424

## 2020-09-17 ENCOUNTER — Inpatient Hospital Stay: Payer: Medicare HMO

## 2020-09-17 ENCOUNTER — Telehealth: Payer: Self-pay | Admitting: Oncology

## 2020-09-17 ENCOUNTER — Inpatient Hospital Stay: Payer: Medicare HMO | Attending: Oncology

## 2020-09-17 ENCOUNTER — Telehealth: Payer: Self-pay | Admitting: Hematology and Oncology

## 2020-09-17 ENCOUNTER — Encounter: Payer: Self-pay | Admitting: Hematology and Oncology

## 2020-09-17 ENCOUNTER — Other Ambulatory Visit: Payer: Self-pay | Admitting: Hematology and Oncology

## 2020-09-17 ENCOUNTER — Inpatient Hospital Stay (INDEPENDENT_AMBULATORY_CARE_PROVIDER_SITE_OTHER): Payer: Medicare HMO | Admitting: Hematology and Oncology

## 2020-09-17 ENCOUNTER — Other Ambulatory Visit: Payer: Self-pay

## 2020-09-17 VITALS — BP 110/70 | HR 74 | Temp 98.6°F | Resp 18 | Ht 64.0 in | Wt 122.8 lb

## 2020-09-17 VITALS — BP 111/60 | HR 72 | Temp 97.8°F | Resp 16 | Ht 64.0 in | Wt 125.7 lb

## 2020-09-17 DIAGNOSIS — C7A012 Malignant carcinoid tumor of the ileum: Secondary | ICD-10-CM

## 2020-09-17 DIAGNOSIS — E340 Carcinoid syndrome: Secondary | ICD-10-CM | POA: Diagnosis not present

## 2020-09-17 LAB — BASIC METABOLIC PANEL
BUN: 9 (ref 4–21)
CO2: 34 — AB (ref 13–22)
Chloride: 102 (ref 99–108)
Creatinine: 0.7 (ref 0.5–1.1)
Glucose: 101
Potassium: 3.6 (ref 3.4–5.3)
Sodium: 138 (ref 137–147)

## 2020-09-17 LAB — CBC AND DIFFERENTIAL
HCT: 39 (ref 36–46)
Hemoglobin: 13 (ref 12.0–16.0)
Neutrophils Absolute: 4.61
Platelets: 286 (ref 150–399)
WBC: 9.4

## 2020-09-17 LAB — COMPREHENSIVE METABOLIC PANEL
Albumin: 4 (ref 3.5–5.0)
Calcium: 9.4 (ref 8.7–10.7)

## 2020-09-17 LAB — HEPATIC FUNCTION PANEL
ALT: 9 (ref 7–35)
AST: 21 (ref 13–35)
Alkaline Phosphatase: 111 (ref 25–125)
Bilirubin, Total: 0.4

## 2020-09-17 LAB — CBC: RBC: 4.04 (ref 3.87–5.11)

## 2020-09-17 MED ORDER — OCTREOTIDE ACETATE 20 MG IM KIT
20.0000 mg | PACK | Freq: Once | INTRAMUSCULAR | Status: AC
Start: 2020-09-17 — End: 2020-09-17
  Administered 2020-09-17: 20 mg via INTRAMUSCULAR

## 2020-09-17 MED ORDER — HYDROCODONE-ACETAMINOPHEN 5-325 MG PO TABS: 1.0000 | ORAL_TABLET | ORAL | 0 refills | Status: DC | PRN

## 2020-09-17 MED ORDER — OCTREOTIDE ACETATE 20 MG IM KIT
PACK | INTRAMUSCULAR | Status: AC
  Filled 2020-09-17: qty 1

## 2020-09-17 NOTE — Telephone Encounter (Signed)
Per 5/23 LOS, scheduled patient's June Appt's.  Gave patient Appt Summary

## 2020-09-17 NOTE — Patient Instructions (Signed)
Octreotide injection solution What is this medicine? OCTREOTIDE (ok TREE oh tide) is used to reduce blood levels of growth hormone in patients with a condition called acromegaly. This medicine also reduces flushing and watery diarrhea caused by certain types of cancer. This medicine may be used for other purposes; ask your health care provider or pharmacist if you have questions. COMMON BRAND NAME(S): Bynfezia, Sandostatin What should I tell my health care provider before I take this medicine? They need to know if you have any of these conditions:  diabetes  gallbladder disease  kidney disease  liver disease  thyroid disease  an unusual or allergic reaction to octreotide, other medicines, foods, dyes, or preservatives  pregnant or trying to get pregnant  breast-feeding How should I use this medicine? This medicine is for injection under the skin or into a vein (only in emergency situations). It is usually given by a health care professional in a hospital or clinic setting. If you get this medicine at home, you will be taught how to prepare and give this medicine. Allow the injection solution to come to room temperature before use. Do not warm it artificially. Use exactly as directed. Take your medicine at regular intervals. Do not take your medicine more often than directed. It is important that you put your used needles and syringes in a special sharps container. Do not put them in a trash can. If you do not have a sharps container, call your pharmacist or healthcare provider to get one. Talk to your pediatrician regarding the use of this medicine in children. Special care may be needed. Overdosage: If you think you have taken too much of this medicine contact a poison control center or emergency room at once. NOTE: This medicine is only for you. Do not share this medicine with others. What if I miss a dose? If you miss a dose, take it as soon as you can. If it is almost time for your  next dose, take only that dose. Do not take double or extra doses. What may interact with this medicine?  bromocriptine  certain medicines for blood pressure, heart disease, irregular heartbeat  cyclosporine  diuretics  medicines for diabetes, including insulin  quinidine This list may not describe all possible interactions. Give your health care provider a list of all the medicines, herbs, non-prescription drugs, or dietary supplements you use. Also tell them if you smoke, drink alcohol, or use illegal drugs. Some items may interact with your medicine. What should I watch for while using this medicine? Visit your doctor or health care professional for regular checks on your progress. To help reduce irritation at the injection site, use a different site for each injection and make sure the solution is at room temperature before use. This medicine may cause decreases in blood sugar. Signs of low blood sugar include chills, cool, pale skin or cold sweats, drowsiness, extreme hunger, fast heartbeat, headache, nausea, nervousness or anxiety, shakiness, trembling, unsteadiness, tiredness, or weakness. Contact your doctor or health care professional right away if you experience any of these symptoms. This medicine may increase blood sugar. Ask your healthcare provider if changes in diet or medicines are needed if you have diabetes. This medicine may cause a decrease in vitamin B12. You should make sure that you get enough vitamin B12 while you are taking this medicine. Discuss the foods you eat and the vitamins you take with your health care professional. What side effects may I notice from receiving this medicine? Side   effects that you should report to your doctor or health care professional as soon as possible:  allergic reactions like skin rash, itching or hives, swelling of the face, lips, or tongue  fast, slow, or irregular heartbeat  right upper belly pain  severe stomach pain  signs  and symptoms of high blood sugar such as being more thirsty or hungry or having to urinate more than normal. You may also feel very tired or have blurry vision.  signs and symptoms of low blood sugar such as feeling anxious; confusion; dizziness; increased hunger; unusually weak or tired; increased sweating; shakiness; cold, clammy skin; irritable; headache; blurred vision; fast heartbeat; loss of consciousness  unusually weak or tired Side effects that usually do not require medical attention (report to your doctor or health care professional if they continue or are bothersome):  diarrhea  dizziness  gas  headache  nausea, vomiting  pain, redness, or irritation at site where injected  upset stomach This list may not describe all possible side effects. Call your doctor for medical advice about side effects. You may report side effects to FDA at 1-800-FDA-1088. Where should I keep my medicine? Keep out of the reach of children. Store in a refrigerator between 2 and 8 degrees C (36 and 46 degrees F). Protect from light. Allow to come to room temperature naturally. Do not use artificial heat. If protected from light, the injection may be stored at room temperature between 20 and 30 degrees C (70 and 86 degrees F) for 14 days. After the initial use, throw away any unused portion of a multiple dose vial after 14 days. Throw away unused portions of the ampules after use. NOTE: This sheet is a summary. It may not cover all possible information. If you have questions about this medicine, talk to your doctor, pharmacist, or health care provider.  2021 Elsevier/Gold Standard (2018-11-11 13:33:09)  

## 2020-09-17 NOTE — Progress Notes (Signed)
[facility]  [address] [place],  [place]  [address] [phone]  Clinic Day:  09/17/2020  Referring physician: Barnetta Chapel, NP    CHIEF COMPLAINT:  CC: A 65 year old female with history of neuroendocrine/carcinoid tumor here for four week evaluation prior to octreotide injection  Current Treatment:  Surveillance   HISTORY OF PRESENT ILLNESS:  Kristi Romero is a 66 y.o. female referred by Dr. Melina Copa for a transfer of care and further management of her history of neuroendocrine and carcinoid tumor, diagnosed in October 2016.  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  She underwent surgical resection with Dr. 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  Seven of eighteen lymph nodes were involved (7/18).  Primary tumor measured 2.2 x 1.7 x 1.5 cm, with three apparent vascular metastases including a nodule up to 3 cm with metastatic tumor present within 1 mm of the inked radial margin.  In the distal appendix there was also a separate 3 mm carcinoid tumor.  This was a staged as a pT3pN1 with low mitotic rate (0 per 10 high powered fields).  She did require a PEG tube in 2017 for failure to thrive and this was removed in January 2018.  Her weight was up to 157 by September 2021.   Her last follow up was in January 2020 with Dr. Verl Blalock until we started seeing her in early February 2022.  Allexa complained of weight loss, stomach pain and nausea.  She was evaluated by Dr. Melina Copa with endoscopy in November 2021 which revealed chronic gastritis and was negative for H.Pylori.  No evidence of malignancy was observed.  She also  notes bilateral numbness "from her mouth to her feet".   She receives B12 injections every 6 months.  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  Chromogranin A from February was elevated at 262.3, but CT scan showed bilateral adrenal adenomas but no evidence of progressive disease.  She was started on octreotide injection on March 1st.    INTERVAL HISTORY:  Treena is here for evaluation prior to her next octreotide injection.  She continues to have hot flashes. She continues to have pain in her upper abdomen that she rates as 8/10. She does not take anything for the pain. She had her colonoscopy with Dr. Melina Copa which she states revealed polyps. She has nausea which prevents her from eating. Her appetite is very poor. She has lost 3 pounds since last visit. She denies shortness of breath. She denies issue with bowel or bladder. She denies fever, chills or vomiting. CBC and CMP are unremarkable today.   REVIEW OF SYSTEMS:  Review of Systems  Constitutional: Positive for appetite change. Negative for chills, diaphoresis, fatigue, fever and unexpected weight change.  HENT:   Negative for hearing loss, lump/mass, mouth sores, nosebleeds, sore throat, tinnitus, trouble swallowing and voice change.   Eyes: Negative for eye problems and icterus.  Respiratory: Negative for chest tightness, cough, hemoptysis, shortness of breath and wheezing.   Cardiovascular: Negative for chest pain, leg swelling and palpitations.  Gastrointestinal: Positive for abdominal pain and nausea. Negative for abdominal distention, blood in stool, constipation, diarrhea,  rectal pain and vomiting.  Endocrine: Negative for hot flashes.  Genitourinary: Negative for bladder incontinence, difficulty urinating, dyspareunia, dysuria, frequency, hematuria and nocturia.   Musculoskeletal: Negative for arthralgias, back pain, flank pain, gait problem, myalgias, neck pain and neck stiffness.  Skin:  Negative for itching, rash and wound.  Neurological: Positive for numbness. Negative for dizziness, extremity weakness, gait problem, headaches, light-headedness, seizures and speech difficulty.  Hematological: Negative for adenopathy. Does not bruise/bleed easily.  Psychiatric/Behavioral: Negative for confusion, decreased concentration, depression, sleep disturbance and suicidal ideas. The patient is not nervous/anxious.      VITALS:  Blood pressure 111/60, pulse 72, temperature 97.8 F (36.6 C), temperature source Oral, resp. rate 16, height 5\' 4"  (1.626 m), weight 125 lb 11.2 oz (57 kg), SpO2 94 %.  Wt Readings from Last 3 Encounters:  09/17/20 122 lb 12 oz (55.7 kg)  09/17/20 125 lb 11.2 oz (57 kg)  08/20/20 128 lb 12 oz (58.4 kg)    Body mass index is 21.58 kg/m.  Performance status (ECOG): 2 - Symptomatic, <50% confined to bed  PHYSICAL EXAM:  Physical Exam Constitutional:      General: She is not in acute distress.    Appearance: Normal appearance. She is normal weight.  HENT:     Head: Normocephalic and atraumatic.  Eyes:     General: No scleral icterus.    Extraocular Movements: Extraocular movements intact.     Conjunctiva/sclera: Conjunctivae normal.     Pupils: Pupils are equal, round, and reactive to light.  Cardiovascular:     Rate and Rhythm: Normal rate and regular rhythm.     Pulses: Normal pulses.     Heart sounds: Normal heart sounds. No murmur heard. No friction rub. No gallop.   Pulmonary:     Effort: Pulmonary effort is normal. No respiratory distress.     Breath sounds: Normal breath sounds.  Abdominal:     General: Bowel sounds are normal. There is no distension.     Palpations: Abdomen is soft. There is no hepatomegaly, splenomegaly or mass.     Tenderness: There is abdominal tenderness. There is guarding.  Musculoskeletal:        General: Normal range of motion.     Cervical back: Normal range of motion and neck supple.     Right lower leg: No  edema.     Left lower leg: No edema.  Lymphadenopathy:     Cervical: No cervical adenopathy.  Skin:    General: Skin is warm and dry.  Neurological:     General: No focal deficit present.     Mental Status: She is alert and oriented to person, place, and time. Mental status is at baseline.  Psychiatric:        Mood and Affect: Mood normal.        Behavior: Behavior normal.        Thought Content: Thought content normal.        Judgment: Judgment normal.     LABS:   CBC Latest Ref Rng & Units 09/17/2020 08/20/2020 07/23/2020  WBC - 9.4 8.9 10.3  Hemoglobin 12.0 - 16.0 13.0 14.3 13.0  Hematocrit 36 - 46 39 42 39  Platelets 150 - 399 286 231 223   CMP Latest Ref Rng & Units 09/17/2020 08/20/2020 07/23/2020  Glucose 70 - 99 mg/dL - - -  BUN 4 - 21 9 7 9   Creatinine 0.5 - 1.1 0.7 0.9 0.7  Sodium 137 - 147 138 136(A)  139  Potassium 3.4 - 5.3 3.6 2.8(A) 4.6  Chloride 99 - 108 102 100 103  CO2 13 - 22 34(A) 31(A) 36(A)  Calcium 8.7 - 10.7 9.4 9.8 9.7  Total Protein 6.5 - 8.1 g/dL - - -  Total Bilirubin 0.3 - 1.2 mg/dL - - -  Alkaline Phos 25 - 125 111 101 88  AST 13 - 35 21 23 20   ALT 7 - 35 9 12 9     STUDIES:     HISTORY:   Allergies:  Allergies  Allergen Reactions  . Codeine Nausea Only  . Lisinopril Swelling    Current Medications: Current Outpatient Medications  Medication Sig Dispense Refill  . HYDROcodone-acetaminophen (NORCO/VICODIN) 5-325 MG tablet Take 1 tablet by mouth every 4 (four) hours as needed for moderate pain. 30 tablet 0  . atorvastatin (LIPITOR) 80 MG tablet Take 80 mg by mouth daily.    . baclofen (LIORESAL) 10 MG tablet Take 10 mg by mouth 2 (two) times daily.    Marland Kitchen dicyclomine (BENTYL) 20 MG tablet SMARTSIG:1 Tablet(s) By Mouth Morning-Evening    . DULoxetine (CYMBALTA) 60 MG capsule Take 60 mg by mouth as needed.     . famotidine (PEPCID) 20 MG tablet Take 40 mg by mouth at bedtime. (Patient not taking: Reported on 12/27/2019)    . gabapentin  (NEURONTIN) 600 MG tablet Take 600 mg by mouth 3 (three) times daily.    Marland Kitchen linaclotide (LINZESS) 290 MCG CAPS capsule Take 290 mcg by mouth daily as needed.     . meclizine (ANTIVERT) 25 MG tablet Take 25 mg by mouth every 8 (eight) hours as needed for dizziness.    . metoCLOPramide (REGLAN) 5 MG tablet Take 1 tablet (5 mg total) by mouth in the morning, at noon, and at bedtime. 90 tablet 0  . metoprolol tartrate (LOPRESSOR) 25 MG tablet Take 1 tablet (25 mg total) by mouth 2 (two) times daily. 60 tablet 2  . ondansetron (ZOFRAN ODT) 4 MG disintegrating tablet Take 1 tablet (4 mg total) by mouth every 8 (eight) hours as needed for nausea or vomiting. 40 tablet 0  . pantoprazole (PROTONIX) 40 MG tablet Take 1 tablet (40 mg total) by mouth 2 (two) times daily before a meal. (Patient taking differently: Take 40 mg by mouth daily.) 60 tablet 2  . potassium chloride SA (KLOR-CON) 20 MEQ tablet Take 1 tablet (20 mEq total) by mouth 3 (three) times daily. 21 tablet 0  . sucralfate (CARAFATE) 1 g tablet Take 1 g by mouth 4 (four) times daily.    Marland Kitchen tiZANidine (ZANAFLEX) 4 MG tablet Take 4 mg by mouth as needed for muscle spasms.     No current facility-administered medications for this visit.     ASSESSMENT & PLAN:   Assessment:   1.  History of neuroendocrine/carcinoid tumor of the ascending colon, diagnosed in October 2016, treated with surgical resection.  Seven of eighteen lymph nodes were involved (7/18).  Primary tumor measured 2.2 x 1.7 x 1.5 cm, with three apparent vascular metastases including a nodule up to 3 cm with metastatic tumor present within 1 mm of the inked radial margin.  In the distal appendix there was also a separate 3 mm carcinoid tumor.  This was a staged as a pT3pN1 with low mitotic rate (0 per 10 high powered fields).  [name] was sporadic.  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  24  hour urine for 5HIAA was normal.  Chromogranin A from February was elevated at 262.3, but CT imaging was stable with no evidence of residual or recurrent disease.  She was placed on monthly octreotide injections in March.  She will continue with her next scheduled injection today. She had colonoscopy with Dr. Melina Copa that she states revealed polyps.   2.  Weight loss, abdominal pain and nausea.  She states that her appetite is poor due to nausea. She has lost 3 pounds since last visit. She uses zofran without relief.   3. Abdominal pain. This has been chronic and she has never taken anything for it. We will try hydrocodone.   Plan: She will continue with monthly octreotide injections today. I will send in prescription for hydrocodone 5 mg every 4 hours as needed for pain to see if this helps with her abdominal pain. She knows to continue with protein shakes if she can't tolerate solid foods. I will attempt to contact Dr. Melina Copa regarding his findings and the patients concern about needing her feeding tube back. We will see her back in 4 weeks for repeat evaluation.    Both her and her aid verbalize understanding of and agreement to the plans discussed today. They know to call the office should any new questions or concerns arise.   [name], NP [facility] AT [facility] [address] [place] [address] Dept: [phone] Dept Fax: [phone]

## 2020-09-17 NOTE — Addendum Note (Signed)
Addended by: Juanetta Beets on: 09/17/2020 11:04 AM   Modules accepted: Orders

## 2020-09-18 LAB — CHROMOGRANIN A: Chromogranin A (ng/mL): 47.6 ng/mL (ref 0.0–101.8)

## 2020-09-26 ENCOUNTER — Other Ambulatory Visit: Payer: Self-pay

## 2020-09-26 ENCOUNTER — Inpatient Hospital Stay: Payer: Medicare HMO | Attending: Oncology

## 2020-09-26 ENCOUNTER — Other Ambulatory Visit: Payer: Self-pay | Admitting: Hematology and Oncology

## 2020-09-26 DIAGNOSIS — C7A012 Malignant carcinoid tumor of the ileum: Secondary | ICD-10-CM

## 2020-09-26 DIAGNOSIS — Z79899 Other long term (current) drug therapy: Secondary | ICD-10-CM | POA: Insufficient documentation

## 2020-10-04 ENCOUNTER — Encounter: Payer: Self-pay | Admitting: Hematology and Oncology

## 2020-10-15 ENCOUNTER — Encounter: Payer: Self-pay | Admitting: Hematology and Oncology

## 2020-10-15 ENCOUNTER — Inpatient Hospital Stay: Payer: Medicare HMO

## 2020-10-15 ENCOUNTER — Other Ambulatory Visit: Payer: Self-pay

## 2020-10-15 ENCOUNTER — Other Ambulatory Visit: Payer: Self-pay | Admitting: Hematology and Oncology

## 2020-10-15 ENCOUNTER — Inpatient Hospital Stay (INDEPENDENT_AMBULATORY_CARE_PROVIDER_SITE_OTHER): Payer: Medicare HMO | Admitting: Hematology and Oncology

## 2020-10-15 VITALS — BP 98/59 | HR 76 | Resp 18 | Ht 64.0 in | Wt 125.3 lb

## 2020-10-15 VITALS — BP 98/58 | HR 65 | Temp 98.4°F | Resp 16 | Ht 64.0 in | Wt 125.3 lb

## 2020-10-15 DIAGNOSIS — C7A012 Malignant carcinoid tumor of the ileum: Secondary | ICD-10-CM

## 2020-10-15 DIAGNOSIS — Z79899 Other long term (current) drug therapy: Secondary | ICD-10-CM | POA: Diagnosis not present

## 2020-10-15 LAB — COMPREHENSIVE METABOLIC PANEL
Albumin: 4.1 (ref 3.5–5.0)
Calcium: 9.5 (ref 8.7–10.7)

## 2020-10-15 LAB — BASIC METABOLIC PANEL
BUN: 10 (ref 4–21)
CO2: 28 — AB (ref 13–22)
Chloride: 102 (ref 99–108)
Creatinine: 0.6 (ref 0.5–1.1)
Glucose: 112
Potassium: 4.3 (ref 3.4–5.3)
Sodium: 136 — AB (ref 137–147)

## 2020-10-15 LAB — HEPATIC FUNCTION PANEL
ALT: 10 (ref 7–35)
AST: 20 (ref 13–35)
Alkaline Phosphatase: 92 (ref 25–125)
Bilirubin, Total: 0.4

## 2020-10-15 LAB — CBC: RBC: 3.76 — AB (ref 3.87–5.11)

## 2020-10-15 LAB — CBC AND DIFFERENTIAL
HCT: 37 (ref 36–46)
Hemoglobin: 12.5 (ref 12.0–16.0)
Neutrophils Absolute: 4.85
Platelets: 263 (ref 150–399)
WBC: 9.7

## 2020-10-15 MED ORDER — OCTREOTIDE ACETATE 20 MG IM KIT
20.0000 mg | PACK | Freq: Once | INTRAMUSCULAR | Status: AC
  Administered 2020-10-15: 20 mg via INTRAMUSCULAR

## 2020-10-15 MED ORDER — OCTREOTIDE ACETATE 20 MG IM KIT
PACK | INTRAMUSCULAR | Status: AC
  Filled 2020-10-15: qty 1

## 2020-10-15 NOTE — Progress Notes (Signed)
Kristi Romero  [address] [place],  [place]  [address] [phone]  Clinic Day:  10/15/2020  Referring physician: Barnetta Chapel, NP    CHIEF COMPLAINT:  CC: A 65 year old female with history of neuroendocrine/carcinoid tumor here for four week evaluation prior to octreotide injection  Current Treatment:  Surveillance   HISTORY OF PRESENT ILLNESS:  Kristi Romero is a 65 y.o. female referred by Dr. Melina Copa for a transfer of care and further management of her history of neuroendocrine and carcinoid tumor, diagnosed in October 2016.  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  She underwent surgical resection with Dr. 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  Seven of eighteen lymph nodes were involved (7/18).  Primary tumor measured 2.2 x 1.7 x 1.5 cm, with three apparent vascular metastases including a nodule up to 3 cm with metastatic tumor present within 1 mm of the inked radial margin.  In the distal appendix there was also a separate 3 mm carcinoid tumor.  This was a staged as a pT3pN1 with low mitotic rate (0 per 10 high powered fields).  She did require a PEG tube in 2017 for failure to thrive and this was removed in January 2018.  Her weight was up to 157 by September 2021.   Her last follow up was in January 2020 with Dr. Verl Blalock until we started seeing her in early February 2022.  Theo complained of weight loss, stomach pain and nausea.  She was evaluated by Dr. Melina Copa with endoscopy in November 2021 which revealed chronic gastritis and was negative for H.Pylori.  No evidence of malignancy was observed.  She also  notes bilateral numbness "from her mouth to her feet".   She receives B12 injections every 6 months.  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  Chromogranin A from February was elevated at 262.3, but CT scan showed bilateral adrenal adenomas but no evidence of progressive disease.  She was started on octreotide injection on March 1st.    INTERVAL HISTORY:  Hollis is here for evaluation prior to her next octreotide injection.  She continues to have hot flashes. She continues to have pain in her upper abdomen that she rates as 8/10. She does not take anything for the pain. She had her colonoscopy with Dr. Melina Copa which she states revealed polyps. She has nausea which prevents her from eating. Her appetite is very poor. She reports feeling very weak today. She denies fever or chills. She denies shortness of breath, chest pain or cough. She was evaluated recently in the ED with negative abdominal findings on imaging. CBC and CMP are unremarkable today.    REVIEW OF SYSTEMS:  Review of Systems  Constitutional:  Positive for appetite change. Negative for chills, diaphoresis, fatigue, fever and unexpected weight change.  HENT:   Negative for hearing loss, lump/mass, mouth sores, nosebleeds, sore throat, tinnitus, trouble swallowing and voice change.   Eyes:  Negative for eye problems and icterus.  Respiratory:  Negative for chest tightness, cough, hemoptysis, shortness of breath and wheezing.   Cardiovascular:  Negative for chest pain, leg swelling and palpitations.  Gastrointestinal:  Positive for  abdominal pain and nausea. Negative for abdominal distention, blood in stool, constipation, diarrhea, rectal pain and vomiting.  Endocrine: Negative for hot flashes.  Genitourinary:  Negative for bladder incontinence, difficulty urinating, dyspareunia, dysuria, frequency, hematuria and nocturia.   Musculoskeletal:  Negative for arthralgias, back pain, flank pain, gait  problem, myalgias, neck pain and neck stiffness.  Skin:  Negative for itching, rash and wound.  Neurological:  Positive for numbness. Negative for dizziness, extremity weakness, gait problem, headaches, light-headedness, seizures and speech difficulty.  Hematological:  Negative for adenopathy. Does not bruise/bleed easily.  Psychiatric/Behavioral:  Negative for confusion, decreased concentration, depression, sleep disturbance and suicidal ideas. The patient is not nervous/anxious.     VITALS:  Blood pressure (!) 98/58, pulse 65, temperature 98.4 F (36.9 C), temperature source Oral, resp. rate 16, height 5\' 4"  (1.626 m), weight 125 lb 4.8 oz (56.8 kg), SpO2 98 %.  Wt Readings from Last 3 Encounters:  10/15/20 125 lb 4.8 oz (56.8 kg)  10/15/20 125 lb 4.8 oz (56.8 kg)  09/17/20 122 lb 12 oz (55.7 kg)    Body mass index is 21.51 kg/m.  Performance status (ECOG): 2 - Symptomatic, <50% confined to bed  PHYSICAL EXAM:  Physical Exam Constitutional:      General: She is not in acute distress.    Appearance: Normal appearance. She is normal weight.  HENT:     Head: Normocephalic and atraumatic.  Eyes:     General: No scleral icterus.    Extraocular Movements: Extraocular movements intact.     Conjunctiva/sclera: Conjunctivae normal.     Pupils: Pupils are equal, round, and reactive to light.  Cardiovascular:     Rate and Rhythm: Normal rate and regular rhythm.     Pulses: Normal pulses.     Heart sounds: Normal heart sounds. No murmur heard.   No friction rub. No gallop.  Pulmonary:     Effort: Pulmonary effort is normal. No respiratory distress.     Breath sounds: Normal breath sounds.  Abdominal:     General: Bowel sounds are normal. There is no distension.     Palpations: Abdomen is soft. There is no hepatomegaly, splenomegaly or mass.     Tenderness: There is abdominal tenderness. There is guarding.  Musculoskeletal:        General: Normal range of motion.     Cervical back:  Normal range of motion and neck supple.     Right lower leg: No edema.     Left lower leg: No edema.  Lymphadenopathy:     Cervical: No cervical adenopathy.  Skin:    General: Skin is warm and dry.  Neurological:     General: No focal deficit present.     Mental Status: She is alert and oriented to person, place, and time. Mental status is at baseline.  Psychiatric:        Mood and Affect: Mood normal.        Behavior: Behavior normal.        Thought Content: Thought content normal.        Judgment: Judgment normal.    LABS:   CBC Latest Ref Rng & Units 10/15/2020 09/17/2020 08/20/2020  WBC - 9.7 9.4 8.9  Hemoglobin 12.0 - 16.0 12.5 13.0 14.3  Hematocrit 36 - 46 37 39 42  Platelets 150 - 399 263 286 231   CMP Latest Ref Rng & Units 10/15/2020 09/17/2020 08/20/2020  Glucose 70 - 99 mg/dL - - -  BUN 4 -  21 10 9 7   Creatinine 0.5 - 1.1 0.6 0.7 0.9  Sodium 137 - 147 136(A) 138 136(A)  Potassium 3.4 - 5.3 4.3 3.6 2.8(A)  Chloride 99 - 108 102 102 100  CO2 13 - 22 28(A) 34(A) 31(A)  Calcium 8.7 - 10.7 9.5 9.4 9.8  Total Protein 6.5 - 8.1 g/dL - - -  Total Bilirubin 0.3 - 1.2 mg/dL - - -  Alkaline Phos 25 - 125 92 111 101  AST 13 - 35 20 21 23   ALT 7 - 35 10 9 12     STUDIES:     HISTORY:   Allergies:  Allergies  Allergen Reactions   Codeine Nausea Only   Lisinopril Swelling    Current Medications: Current Outpatient Medications  Medication Sig Dispense Refill   atorvastatin (LIPITOR) 80 MG tablet Take 80 mg by mouth daily.     baclofen (LIORESAL) 10 MG tablet Take 10 mg by mouth 2 (two) times daily.     dicyclomine (BENTYL) 20 MG tablet SMARTSIG:1 Tablet(s) By Mouth Morning-Evening     DULoxetine (CYMBALTA) 60 MG capsule Take 60 mg by mouth as needed.      famotidine (PEPCID) 20 MG tablet Take 40 mg by mouth at bedtime. (Patient not taking: Reported on 12/27/2019)     gabapentin (NEURONTIN) 600 MG tablet Take 600 mg by mouth 3 (three) times daily.      HYDROcodone-acetaminophen (NORCO/VICODIN) 5-325 MG tablet Take 1 tablet by mouth every 4 (four) hours as needed for moderate pain. 30 tablet 0   linaclotide (LINZESS) 290 MCG CAPS capsule Take 290 mcg by mouth daily as needed.      meclizine (ANTIVERT) 25 MG tablet Take 25 mg by mouth every 8 (eight) hours as needed for dizziness.     metoCLOPramide (REGLAN) 5 MG tablet Take 1 tablet (5 mg total) by mouth in the morning, at noon, and at bedtime. 90 tablet 0   metoprolol tartrate (LOPRESSOR) 25 MG tablet Take 1 tablet (25 mg total) by mouth 2 (two) times daily. 60 tablet 2   ondansetron (ZOFRAN ODT) 4 MG disintegrating tablet Take 1 tablet (4 mg total) by mouth every 8 (eight) hours as needed for nausea or vomiting. 40 tablet 0   pantoprazole (PROTONIX) 40 MG tablet Take 1 tablet (40 mg total) by mouth 2 (two) times daily before a meal. (Patient taking differently: Take 40 mg by mouth daily.) 60 tablet 2   potassium chloride SA (KLOR-CON) 20 MEQ tablet Take 1 tablet (20 mEq total) by mouth 3 (three) times daily. 21 tablet 0   sucralfate (CARAFATE) 1 g tablet Take 1 g by mouth 4 (four) times daily.     tiZANidine (ZANAFLEX) 4 MG tablet Take 4 mg by mouth as needed for muscle spasms.     No current facility-administered medications for this visit.     ASSESSMENT & PLAN:   Assessment:   1.  History of neuroendocrine/carcinoid tumor of the ascending colon, diagnosed in October 2016, treated with surgical resection.  Seven of eighteen lymph nodes were involved (7/18).  Primary tumor measured 2.2 x 1.7 x 1.5 cm, with three apparent vascular metastases including a nodule up to 3 cm with metastatic tumor present within 1 mm of the inked radial margin.  In the distal appendix there was also a separate 3 mm carcinoid tumor.  This was a staged as a pT3pN1 with low mitotic rate (0 per 10 high powered fields).  [name] was sporadic.  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  24 hour urine for 5HIAA was normal.  Chromogranin A from February was elevated at 262.3, but CT imaging was stable with no evidence of residual or recurrent disease.  She was placed on monthly octreotide injections in March.  She will continue with her next scheduled injection today. She had colonoscopy with Dr. Melina Copa that she states revealed polyps.   2.  Weight loss, abdominal pain and nausea.  She states that her appetite is poor due to nausea. She has lost 3 pounds since last visit. She uses zofran without relief. I have arranged a follow up with [facility] on July 20th at 1:00 pm. Both she and her caregiver were notified of that appointment today.  3. Abdominal pain. This has been chronic and she has never taken anything for it. We tried hydrocodone and she states this helped some.   Plan: She will continue with monthly octreotide injections today. She knows to continue with protein shakes if she can't tolerate solid foods. She will see GI on 07/20 and we will follow up with her after this appointment.   Both her and her aid verbalize understanding of and agreement to the plans discussed today. They know to call the office should any new questions or concerns arise.   [name], NP [facility] AT [facility] [address] [place] [address] Dept: [phone] Dept Fax: [phone]

## 2020-10-15 NOTE — Patient Instructions (Signed)
Octreotide acetate injection suspension What is this medication? OCTREOTIDE (ok TREE oh tide) is used to reduce blood levels of growth hormone in patients with a condition called acromegaly. This medicine also reducesflushing and watery diarrhea caused by certain types of cancer. This medicine may be used for other purposes; ask your health care provider orpharmacist if you have questions. COMMON BRAND NAME(S): Sandostatin LAR What should I tell my care team before I take this medication? They need to know if you have any of these conditions: diabetes gallbladder disease kidney disease liver disease thyroid disease an unusual or allergic reaction to octreotide, other medicines, foods, dyes, or preservatives pregnant or trying to get pregnant breast-feeding How should I use this medication? This medicine is for injection into a muscle. It is usually given by a healthcare professional in a hospital or clinic setting. Talk to your pediatrician regarding the use of this medicine in children.Special care may be needed. Overdosage: If you think you have taken too much of this medicine contact apoison control center or emergency room at once. NOTE: This medicine is only for you. Do not share this medicine with others. What if I miss a dose? Keep appointments for follow-up doses. It is important not to miss your dose. Call your doctor or health care professional if you are unable to keep anappointment. What may interact with this medication? Do not take this medicine with any of the following medications: cisapride dronedarone flibanserin lutetium Lu 177 dotatate pimozide saquinavir thioridazine This medicine may also interact with the following medications: bromocriptine certain medicines for blood pressure, heart disease, irregular heartbeat cyclosporine diuretics medicines for diabetes, including insulin quinidine This list may not describe all possible interactions. Give your health  care provider a list of all the medicines, herbs, non-prescription drugs, or dietary supplements you use. Also tell them if you smoke, drink alcohol, or use illegaldrugs. Some items may interact with your medicine. What should I watch for while using this medication? Visit your health care professional for regular checks on your progress. Tell your health care professional if your symptoms do not start to get better or ifthey get worse. This medicine may cause decreases in blood sugar. Signs of low blood sugar include chills, cool, pale skin or cold sweats, drowsiness, extreme hunger, fast heartbeat, headache, nausea, nervousness or anxiety, shakiness, trembling, unsteadiness, tiredness, or weakness. Contact your doctor or health careprofessional right away if you experience any of these symptoms. This medicine may increase blood sugar. Ask your healthcare provider if changesin diet or medicines are needed if you have diabetes. This medicine may cause a decrease in vitamin B12. You should make sure that you get enough vitamin B12 while you are taking this medicine. Discuss thefoods you eat and the vitamins you take with your health care professional. What side effects may I notice from receiving this medication? Side effects that you should report to your doctor or health care professionalas soon as possible: allergic reactions like skin rash, itching or hives, swelling of the face, lips, or tongue fast, slow, or irregular heartbeat right upper belly pain severe stomach pain signs and symptoms of high blood sugar such as being more thirsty or hungry or having to urinate more than normal. You may also feel very tired or have blurry vision. signs and symptoms of low blood sugar such as feeling anxious; confusion; dizziness; increased hunger; unusually weak or tired; increased sweating; shakiness; cold, clammy skin; irritable; headache; blurred vision; fast heartbeat; loss of consciousness unusually weak  or tired Side effects that usually do not require medical attention (report these toyour doctor or health care professional if they continue or are bothersome): diarrhea dizziness gas headache nausea, vomiting pain, redness, or irritation at site where injected upset stomach This list may not describe all possible side effects. Call your doctor for medical advice about side effects. You may report side effects to FDA at1-800-FDA-1088. Where should I keep my medication? This medicine is given in a hospital or clinic and will not be stored at home. NOTE: This sheet is a summary. It may not cover all possible information. If you have questions about this medicine, talk to your doctor, pharmacist, orhealth care provider.  2022 [employer] Standard (2019-08-02 18:31:50)

## 2020-10-16 LAB — CHROMOGRANIN A: Chromogranin A (ng/mL): 61.4 ng/mL (ref 0.0–101.8)

## 2020-10-17 ENCOUNTER — Other Ambulatory Visit: Payer: Self-pay | Admitting: Hematology and Oncology

## 2020-10-17 MED ORDER — LINACLOTIDE 290 MCG PO CAPS: 290.0000 ug | ORAL_CAPSULE | Freq: Every day | ORAL | 0 refills | Status: DC | PRN

## 2020-10-23 ENCOUNTER — Telehealth: Payer: Self-pay | Admitting: Oncology

## 2020-10-23 NOTE — Telephone Encounter (Signed)
10/23/20 All appts scheduled and confirmed with patient

## 2020-11-09 NOTE — Progress Notes (Signed)
[facility]  [address] [place],  [place]  [address] [phone]  Clinic Day:  11/15/2020  Referring physician: Barnetta Chapel, NP   This document serves as a record of services personally performed by Hosie Poisson, MD. It was created on their behalf by Curry,Lauren E, a trained medical scribe. The creation of this record is based on the scribe's personal observations and the provider's statements to them.  CHIEF COMPLAINT:  CC: History of neuroendocrine/carcinoid tumor   Current Treatment:  Octreotide injections  HISTORY OF PRESENT ILLNESS:  Kristi Romero is a 65 y.o. female referred by Dr. Melina Copa for a transfer of care and further management of her history of neuroendocrine and carcinoid tumor, diagnosed in October 2016.  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  She underwent surgical resection with Dr. 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  Seven of eighteen lymph nodes were involved (7/18).  Primary tumor measured 2.2 x 1.7 x 1.5 cm, with three apparent vascular metastases including a nodule up to 3 cm with metastatic tumor present within 1 mm of the inked radial margin.  In the distal appendix there was also a separate 3 mm carcinoid tumor.  This was a staged as a pT3pN1 with low mitotic rate (0 per 10 high powered fields).  She did require a PEG tube in 2017 for failure to thrive and this was removed in January 2018.  Her weight was up to 157 by September 2021.   Her last follow up was in January 2020 with Dr. Verl Blalock until we started seeing her in early February 2022.  Kristi Romero complained of weight loss,  stomach pain and nausea.  She was evaluated by Dr. Melina Copa with endoscopy in November 2021 which revealed chronic gastritis and was negative for H.Pylori.  No evidence of malignancy was observed.  She also notes bilateral numbness "from her mouth to her feet".   She receives B12 injections every 6 months.  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  Chromogranin A from February was elevated at 262.3, but CT scan showed bilateral adrenal adenomas but no evidence of progressive disease.  She was started on octreotide injection on March 1st with good response based on her decreasing chromogranin A, last measuring 61.4 in June.    INTERVAL HISTORY:  Kristi Romero is here for follow up prior to her next octreotide.  She states that the only medications she is taking is Zofran and Protonix.  She does have intermittent nausea.  She also notes occasional back pain and occasional abdominal pain.  She continues to smoke marijuana.  She was seen by Dr. Lyndel Safe yesterday who recommended that she decrease her THC use.  He also recommended that she continue Zofran for her nausea, and resume Protonix 40 mg.  He placed her on Linzess 290 mcg daily.  We will check a B12 and folate as requested.  He also recommended a trial of Remeron, and I agree, so will send this in today for 15 mg at bedtime.  He will see her again in 6 months.  Labs from July 8th were unremarkable.  Her  appetite is good, and she has  gained 6 pounds since her last visit.  She denies fever, chills or other signs of infection.  She denies vomiting or bowel issues.  She denies sore throat, cough, dyspnea, or chest pain.  REVIEW OF SYSTEMS:  Review of Systems  Constitutional: Negative.  Negative for appetite change, chills, fatigue, fever and unexpected weight change.  HENT:  Negative.    Eyes: Negative.   Respiratory: Negative.  Negative for chest tightness, cough, hemoptysis, shortness of breath and wheezing.    Cardiovascular: Negative.  Negative for chest pain, leg swelling and palpitations.  Gastrointestinal:  Positive for abdominal pain (intermittent) and nausea. Negative for abdominal distention, blood in stool, constipation, diarrhea and vomiting.  Endocrine: Negative.   Genitourinary: Negative.  Negative for difficulty urinating, dysuria, frequency and hematuria.   Musculoskeletal:  Positive for back pain (intermittent). Negative for arthralgias, flank pain, gait problem and myalgias.  Skin: Negative.   Neurological: Negative.  Negative for dizziness, extremity weakness, gait problem, headaches, light-headedness, numbness, seizures and speech difficulty.  Hematological: Negative.   Psychiatric/Behavioral: Negative.  Negative for depression and sleep disturbance. The patient is not nervous/anxious.     VITALS:  Blood pressure 106/60, pulse 65, temperature 98 F (36.7 C), temperature source Oral, resp. rate 18, height 5\' 4"  (1.626 m), weight 131 lb (59.4 kg), SpO2 97 %.  Wt Readings from Last 3 Encounters:  11/15/20 131 lb (59.4 kg)  11/14/20 131 lb (59.4 kg)  10/15/20 125 lb 4.8 oz (56.8 kg)    Body mass index is 22.49 kg/m.  Performance status (ECOG): 2 - Symptomatic, <50% confined to bed  PHYSICAL EXAM:  Physical Exam Constitutional:      General: She is not in acute distress.    Appearance: Normal appearance. She is normal weight.  HENT:     Head: Normocephalic and atraumatic.  Eyes:     General: No scleral icterus.    Extraocular Movements: Extraocular movements intact.     Conjunctiva/sclera: Conjunctivae normal.     Pupils: Pupils are equal, round, and reactive to light.  Cardiovascular:     Rate and Rhythm: Normal rate and regular rhythm.     Pulses: Normal pulses.     Heart sounds: Normal heart sounds. No murmur heard.   No friction rub. No gallop.  Pulmonary:     Effort: Pulmonary effort is normal. No respiratory distress.     Breath sounds: Normal breath sounds.   Abdominal:     General: Bowel sounds are normal. There is no distension.     Palpations: Abdomen is soft. There is no hepatomegaly, splenomegaly or mass.     Tenderness: There is no abdominal tenderness.  Musculoskeletal:        General: Normal range of motion.     Cervical back: Normal range of motion and neck supple.     Right lower leg: No edema.     Left lower leg: No edema.  Lymphadenopathy:     Cervical: No cervical adenopathy.  Skin:    General: Skin is warm and dry.  Neurological:     General: No focal deficit present.     Mental Status: She is alert and oriented to person, place, and time. Mental status is at baseline.  Psychiatric:        Mood and Affect: Mood normal.        Behavior: Behavior normal.        Thought Content: Thought content normal.        Judgment: Judgment normal.  LABS:   CBC Latest Ref Rng & Units 10/15/2020 09/17/2020 08/20/2020  WBC - 9.7 9.4 8.9  Hemoglobin 12.0 - 16.0 12.5 13.0 14.3  Hematocrit 36 - 46 37 39 42  Platelets 150 - 399 263 286 231   CMP Latest Ref Rng & Units 10/15/2020 09/17/2020 08/20/2020  Glucose 70 - 99 mg/dL - - -  BUN 4 - 21 10 9 7   Creatinine 0.5 - 1.1 0.6 0.7 0.9  Sodium 137 - 147 136(A) 138 136(A)  Potassium 3.4 - 5.3 4.3 3.6 2.8(A)  Chloride 99 - 108 102 102 100  CO2 13 - 22 28(A) 34(A) 31(A)  Calcium 8.7 - 10.7 9.5 9.4 9.8  Total Protein 6.5 - 8.1 g/dL - - -  Total Bilirubin 0.3 - 1.2 mg/dL - - -  Alkaline Phos 25 - 125 92 111 101  AST 13 - 35 20 21 23   ALT 7 - 35 10 9 12     STUDIES:   No studies found  HISTORY:   Allergies:  Allergies  Allergen Reactions   Codeine Nausea Only   Lisinopril Swelling    Current Medications: Current Outpatient Medications  Medication Sig Dispense Refill   ondansetron (ZOFRAN ODT) 4 MG disintegrating tablet Take 1 tablet (4 mg total) by mouth every 8 (eight) hours as needed for nausea or vomiting. 40 tablet 0   pantoprazole (PROTONIX) 40 MG tablet Take 1 tablet (40 mg  total) by mouth 2 (two) times daily before a meal. (Patient taking differently: Take 40 mg by mouth daily.) 60 tablet 2   atorvastatin (LIPITOR) 80 MG tablet Take 80 mg by mouth daily. (Patient not taking: Reported on 11/15/2020)     baclofen (LIORESAL) 10 MG tablet Take 10 mg by mouth 2 (two) times daily. (Patient not taking: Reported on 11/15/2020)     dicyclomine (BENTYL) 20 MG tablet SMARTSIG:1 Tablet(s) By Mouth Morning-Evening (Patient not taking: Reported on 11/15/2020)     divalproex (DEPAKOTE) 250 MG DR tablet Take by mouth. (Patient not taking: Reported on 11/15/2020)     DULoxetine (CYMBALTA) 60 MG capsule Take 60 mg by mouth as needed.  (Patient not taking: Reported on 11/15/2020)     famotidine (PEPCID) 20 MG tablet Take 40 mg by mouth at bedtime. (Patient not taking: No sig reported)     gabapentin (NEURONTIN) 600 MG tablet Take 600 mg by mouth 3 (three) times daily. (Patient not taking: Reported on 11/15/2020)     HYDROcodone-acetaminophen (NORCO/VICODIN) 5-325 MG tablet Take 1 tablet by mouth every 4 (four) hours as needed for moderate pain. (Patient not taking: Reported on 11/15/2020) 30 tablet 0   linaclotide (LINZESS) 290 MCG CAPS capsule Take 1 capsule (290 mcg total) by mouth daily as needed. (Patient not taking: Reported on 11/15/2020) 30 capsule 0   meclizine (ANTIVERT) 25 MG tablet Take 25 mg by mouth every 8 (eight) hours as needed for dizziness. (Patient not taking: Reported on 11/15/2020)     metoCLOPramide (REGLAN) 5 MG tablet Take 1 tablet (5 mg total) by mouth in the morning, at noon, and at bedtime. (Patient not taking: Reported on 11/15/2020) 90 tablet 0   metoprolol tartrate (LOPRESSOR) 25 MG tablet Take 1 tablet (25 mg total) by mouth 2 (two) times daily. (Patient not taking: Reported on 11/15/2020) 60 tablet 2   potassium chloride SA (KLOR-CON) 20 MEQ tablet Take 1 tablet (20 mEq total) by mouth 3 (three) times daily. (Patient not taking: Reported on 11/15/2020) 21 tablet 0  sucralfate (CARAFATE) 1 g tablet Take 1 g by mouth 4 (four) times daily. (Patient not taking: Reported on 11/15/2020)     tiZANidine (ZANAFLEX) 4 MG tablet Take 4 mg by mouth as needed for muscle spasms. (Patient not taking: Reported on 11/15/2020)     No current facility-administered medications for this visit.   ASSESSMENT & PLAN:   Assessment:   1.  History of neuroendocrine/carcinoid tumor of the ascending colon, diagnosed in October 2016, treated with surgical resection.  Seven of eighteen lymph nodes were involved (7/18).  Primary tumor measured 2.2 x 1.7 x 1.5 cm, with three apparent vascular metastases including a nodule up to 3 cm with metastatic tumor present within 1 mm of the inked radial margin.  In the distal appendix there was also a separate 3 mm carcinoid tumor.  This was a staged as a pT3pN1 with low mitotic rate (0 per 10 high powered fields).  [name] was sporadic.  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  24 hour urine for 5HIAA was normal.  Chromogranin A from February was elevated at 262.3, but CT imaging was stable with no evidence of residual or recurrent disease.  She was placed on monthly octreotide injections in March 2022 with good response based on her decreasing chromogranin A.  She will continue with her next scheduled injection.  Her last scan was in February so we will repeat at 6 months.  2.  Weight loss, abdominal pain and nausea. She has gained 6 pounds since her last visit.  She uses zofran with partial relief and Protonix.  She will continue to follow with Dr. Lyndel Safe.  I recommend the addition of Remeron 15 mg at bedtime.  3. Abdominal pain. This has been chronic and she has never taken anything for it. We tried hydrocodone and she states this helped some. I did agree to refill it but emphasized the associated constipation and need to continue daily Linzess.    4.  We  need to look for any central nervous system etiology for her problems and will get a CT of the brain.  It could be related to her marijuana usage as well as her depression.  Plan: She will continue with monthly octreotide injections. She knows to continue with protein shakes if she can't tolerate solid foods. I will also place her on Remeron 15 mg Qhs for appetite stimulant.  I will refill hydrocodone 5/325 for her pain.  I will check B12 as requested by Dr. Lyndel Safe.  We will see her back in 4 weeks with CBC, CMP, chromogranin A and CT abdomen and pelvis.  We will also schedule her for CT brain as soon as possible.  Both her and her aid verbalize understanding of and agreement to the plans discussed today. They know to call the office should any new questions or concerns arise.   I provided 20 minutes of face-to-face time during this this encounter and > 50% was spent counseling as documented under my assessment and plan.   Derwood Kaplan, MD [facility] AT [facility] [address] [place] [address] Dept: [phone] Dept Fax: [phone]     I, Rita Ohara, am acting as scribe for Derwood Kaplan, MD  I have reviewed this report as typed by the medical scribe, and it is complete and accurate.

## 2020-11-14 ENCOUNTER — Ambulatory Visit (INDEPENDENT_AMBULATORY_CARE_PROVIDER_SITE_OTHER): Payer: Medicare HMO | Admitting: Gastroenterology

## 2020-11-14 ENCOUNTER — Other Ambulatory Visit: Payer: Self-pay

## 2020-11-14 ENCOUNTER — Encounter: Payer: Self-pay | Admitting: Gastroenterology

## 2020-11-14 VITALS — BP 100/68 | HR 75 | Ht 64.0 in | Wt 131.0 lb

## 2020-11-14 DIAGNOSIS — K219 Gastro-esophageal reflux disease without esophagitis: Secondary | ICD-10-CM | POA: Diagnosis not present

## 2020-11-14 DIAGNOSIS — R110 Nausea: Secondary | ICD-10-CM

## 2020-11-14 NOTE — Patient Instructions (Signed)
If you are age 65 or older, your body mass index should be between 23-30. Your Body mass index is 22.49 kg/m. If this is out of the aforementioned range listed, please consider follow up with your Primary Care Provider.  If you are age 2 or younger, your body mass index should be between 19-25. Your Body mass index is 22.49 kg/m. If this is out of the aformentioned range listed, please consider follow up with your Primary Care Provider.   __________________________________________________________  The [facility] providers would like to encourage you to use [facility] to communicate with providers for non-urgent requests or questions.  Due to long hold times on the telephone, sending your provider a message by [facility] may be a faster and more efficient way to get a response.  Please allow 48 business hours for a response.  Please remember that this is for non-urgent requests.   Please resume Protonix  Please get your Vitamin B12 level checked 11-15-2020  Pleas call back in 6 months to schedule an appointment or call back  sooner with any questions or concerns.  Thank you,  Dr. Jackquline Denmark

## 2020-11-14 NOTE — Progress Notes (Signed)
Chief Complaint: FU  Referring Provider:  Barnetta Chapel, NP      ASSESSMENT AND PLAN;   #1. Nausea but no vomiting- H/O marijuana abuse. H/O PEG placement for failure to thrive 2017.   May have functional component/nonulcer dyspepsia/cannabis hyperemesis syndrome. Has been to multiple gastroenterologists at multiple centers. Nl CBC, CMP and lipase. Neg GES 07/2019. Neg multiple EGDs/colonoscopies/CT as below.  #2. IBS-C. Neg GI WU as below.  #3. 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  24-hour urine for 5-HIAA was normal.  Chromogranin A 05/2020 was elevated at 262.  CT chest Abdo/pelvis 05/2020 neg for recurrence.  #4. H/O TAs (colon 08/2020, rpt 5 yrs)   Plan: -Resume protonix 40mg  po qd #30, 11 refills -Cut down THC use. -Continue zofran 4mg  S/P Q8hrs prn #40 -Linzess 225mcg PO QD -Hot showers. -Small but more frequent meals. -Check B12 tomorrow.  She is scheduled to see Dr. Hinton Rao tomorrow. -Monitor weight.  If there is any weight loss, would consider repeat CT Abdo/pelvis followed by trial of Remeron or  -Resume Ensure. -FU in 6 mts.  HPI:    Kristi Romero is a 65 y.o. female  For follow-up visit  Continues to have nausea without vomiting.  Taking Ensure 5/day.  No weight loss.  In fact she has gained weight.  Has occasional heartburn.  No odynophagia or dysphagia.  Has chronic constipation with pellet-like stools, better with Linzess.  Had EGD/colonoscopy by Dr. Melina Copa.  Reports are awaited Got few reports.  Colonoscopy 09/05/2020: 2 small benign polyps (transverse colon and descending colon).  Otherwise normal to anastomosis. EGD report is awaited.  Per patient was negative.  Negative extensive work-up as detailed below including CT chest Abdo/pelvis 05/2020.  Continues to smoke marijuana 6-7 times per day.  Hot shower helps  with nausea.  She has stopped taking Protonix.      Weight recordings revealed that she has gained weight-6 pounds over last 1 month. 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  24-hour urine for 5-HIAA was normal.  Chromogranin A 05/2020 was elevated at 262.  CT chest Abdo/pelvis 05/2020 neg for recurrence.  In 2017, PEG was placed d/t failure to thrive-patient weighed 108lb at that time, after feeds patient had gained weight to 150lb, Removed 05/26/2016 at [facility].  CT AP/chest with contrast 05/2020, compared with CT 02/25/2019 IMPRESSION: 1.  No acute abnormalities.   2.  Stable left adrenal adenoma. 3.  Partially imaged postoperative findings of partial right colectomy. 4.  Aortic Atherosclerosis (ICD10-I70.0).  GES 07/28/2019: neg. see report under the imaging section.  EGD 02/2018 - Normal esophagus. - Non-bleeding erosive gastropathy. - A single papule (nodule) found in the stomach. Bx- neg - Erythematous mucosa in the gastric body and antrum. Bx- neg - Small hiatal hernia. - Duodenal erosions without bleeding.  EGD by Dr. Al Decant awaited.  Negative  UGI 12/2019 1. Gastroesophageal reflux up to the level of the mid esophagus. 2. Small type 1 hiatal hernia. 3. No overt dysmotility although there was some  mild proximal escape during the thoracic esophageal phase.  Colon: 09/05/2020 (Dr. Melina Copa): 2 small benign polyps (transverse colon and descending colon).  Otherwise normal to anastomosis.   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 Dr Harl Favor [place]    ESOPHAGOGASTRODUODENOSCOPY  02/2018   ESOPHAGOGASTRODUODENOSCOPY (EGD) WITH PROPOFOL N/A 03/16/2018   Procedure: ESOPHAGOGASTRODUODENOSCOPY (EGD) WITH PROPOFOL;  Surgeon: Ladene Artist, MD;  Location: [facility];  Service: Endoscopy;  Laterality: N/A;   PARTIAL HYSTERECTOMY     TOE SURGERY Bilateral    Outpatient Encounter Medications as of 02/17/2019  Medication Sig   acetaminophen (TYLENOL) 325 MG tablet Take 2 tablets (650 mg total) by mouth every 6 (six) hours as needed for mild pain (or Fever >/= 101).   DULoxetine (CYMBALTA) 60 MG capsule Take 60 mg by mouth daily.   hyoscyamine (LEVSIN SL) 0.125 MG SL tablet Place 2 tablets (0.25 mg total) under the tongue 4 (four) times daily -  before meals and at bedtime.   Linaclotide (LINZESS) 290 MCG CAPS capsule Take 290 mcg by mouth daily as needed.    meclizine (ANTIVERT) 25 MG tablet Take 25 mg by mouth every 8 (eight) hours as needed for dizziness.   metoCLOPramide (REGLAN) 5 MG tablet Take 1 tablet (5 mg total) by mouth 3 (three) times daily before meals.   metoprolol tartrate (LOPRESSOR) 25 MG tablet Take 1 tablet (25 mg total) by mouth 2 (two) times daily.   pantoprazole (PROTONIX) 40 MG tablet Take 1 tablet (40 mg total) by mouth 2 (two) times daily before a meal.   atorvastatin (LIPITOR) 40 MG tablet Take 40 mg by mouth daily.       ondansetron (ZOFRAN ODT) 4 MG disintegrating tablet Take 1 tablet (4 mg total) by mouth every 6 (six) hours as needed for nausea or vomiting.   No facility-administered encounter medications on file as of 02/17/2019.     Family History  Problem Relation Age of Onset   Lung cancer Father    Heart disease Father    Hypertension  Father    Heart disease Mother    Hypertension Mother    Cancer Mother     Social History   Tobacco Use   Smoking status: Former   Smokeless tobacco: Never   Tobacco comments:    quit long time ago   Vaping Use   Vaping Use: Never used  Substance Use Topics   Alcohol use: No    Alcohol/week: 0.0 standard drinks   Drug use: Yes    Types: Marijuana    Comment: Currently smokes about 7 marijuana "joints" per week.    Allergies  Allergen Reactions   Codeine Nausea Only   Lisinopril Swelling    Review of Systems:  neg     Physical Exam:    BP 100/68   Pulse 75   Ht 5\' 4"  (1.626 m)   Wt 131 lb (59.4 kg)   LMP  (LMP Unknown)   SpO2 99%   BMI 22.49 kg/m  Filed Weights   11/14/20 1326  Weight: 131 lb (59.4 kg)   Constitutional:  Well-developed, in no acute distress. On wheelchair Psychiatric: Normal mood and affect. Behavior is normal. HEENT: Pupils normal.  Conjunctivae are normal. No scleral icterus. Neck supple.  Cardiovascular: Normal rate, regular rhythm. No edema Pulmonary/chest: Effort normal and breath sounds normal. No wheezing, rales or rhonchi. Abdominal: Soft, nondistended. Nontender. Bowel sounds active throughout. There are no masses palpable. No hepatomegaly.  Multiple well-healed surgical scars. Rectal:  defered Neurological: Alert and oriented to person place and time. Skin: Skin is warm and dry. No rashes noted.  Data Reviewed: I have personally reviewed following labs and imaging studies  CBC: CBC Latest Ref Rng & Units 10/15/2020 09/17/2020 08/20/2020  WBC - 9.7 9.4 8.9  Hemoglobin 12.0 - 16.0 12.5 13.0 14.3  Hematocrit 36 - 46 37 39 42  Platelets 150 - 399 263 286 231    CMP: CMP Latest Ref Rng & Units 10/15/2020 09/17/2020 08/20/2020  Glucose 70 - 99 mg/dL - - -  BUN 4 - 21 10 9 7   Creatinine 0.5 - 1.1 0.6 0.7 0.9  Sodium 137 - 147 136(A) 138 136(A)  Potassium 3.4 - 5.3 4.3 3.6 2.8(A)  Chloride 99 - 108 102 102 100  CO2 13 - 22 28(A)  34(A) 31(A)  Calcium 8.7 - 10.7 9.5 9.4 9.8  Total Protein 6.5 - 8.1 g/dL - - -  Total Bilirubin 0.3 - 1.2 mg/dL - - -  Alkaline Phos 25 - 125 92 111 101  AST 13 - 35 20 21 23   ALT 7 - 35 10 9 12       Carmell Austria, MD 11/14/2020, 1:46 PM  Cc: Barnetta Chapel, NP

## 2020-11-15 ENCOUNTER — Telehealth: Payer: Self-pay | Admitting: Oncology

## 2020-11-15 ENCOUNTER — Encounter: Payer: Self-pay | Admitting: Oncology

## 2020-11-15 ENCOUNTER — Inpatient Hospital Stay: Payer: Medicare HMO

## 2020-11-15 ENCOUNTER — Other Ambulatory Visit: Payer: Self-pay | Admitting: Oncology

## 2020-11-15 ENCOUNTER — Inpatient Hospital Stay: Payer: Medicare HMO | Attending: Oncology | Admitting: Oncology

## 2020-11-15 ENCOUNTER — Other Ambulatory Visit: Payer: Self-pay | Admitting: Pharmacist

## 2020-11-15 ENCOUNTER — Other Ambulatory Visit: Payer: Self-pay

## 2020-11-15 VITALS — BP 107/62 | HR 60 | Temp 98.0°F | Resp 18 | Ht 64.0 in | Wt 131.0 lb

## 2020-11-15 VITALS — BP 106/60 | HR 65 | Temp 98.0°F | Resp 18 | Ht 64.0 in | Wt 131.0 lb

## 2020-11-15 DIAGNOSIS — C7A012 Malignant carcinoid tumor of the ileum: Secondary | ICD-10-CM

## 2020-11-15 DIAGNOSIS — Z79899 Other long term (current) drug therapy: Secondary | ICD-10-CM | POA: Diagnosis not present

## 2020-11-15 LAB — VITAMIN B12: Vitamin B-12: 569 pg/mL (ref 180–914)

## 2020-11-15 MED ORDER — OCTREOTIDE ACETATE 20 MG IM KIT
20.0000 mg | PACK | Freq: Once | INTRAMUSCULAR | Status: AC
  Administered 2020-11-15: 20 mg via INTRAMUSCULAR

## 2020-11-15 MED ORDER — MIRTAZAPINE 15 MG PO TABS: 15.0000 mg | ORAL_TABLET | Freq: Every day | ORAL | 5 refills | Status: DC

## 2020-11-15 MED ORDER — HYDROCODONE-ACETAMINOPHEN 5-325 MG PO TABS: 1.0000 | ORAL_TABLET | ORAL | 0 refills | Status: DC | PRN

## 2020-11-15 NOTE — Telephone Encounter (Signed)
Per 7/21 los next appt scheduled and given to patient 

## 2020-11-15 NOTE — Patient Instructions (Signed)
Octreotide acetate injection suspension What is this medication? OCTREOTIDE (ok TREE oh tide) is used to reduce blood levels of growth hormone in patients with a condition called acromegaly. This medicine also reducesflushing and watery diarrhea caused by certain types of cancer. This medicine may be used for other purposes; ask your health care provider orpharmacist if you have questions. COMMON BRAND NAME(S): Sandostatin LAR What should I tell my care team before I take this medication? They need to know if you have any of these conditions: diabetes gallbladder disease kidney disease liver disease thyroid disease an unusual or allergic reaction to octreotide, other medicines, foods, dyes, or preservatives pregnant or trying to get pregnant breast-feeding How should I use this medication? This medicine is for injection into a muscle. It is usually given by a healthcare professional in a hospital or clinic setting. Talk to your pediatrician regarding the use of this medicine in children.Special care may be needed. Overdosage: If you think you have taken too much of this medicine contact apoison control center or emergency room at once. NOTE: This medicine is only for you. Do not share this medicine with others. What if I miss a dose? Keep appointments for follow-up doses. It is important not to miss your dose. Call your doctor or health care professional if you are unable to keep anappointment. What may interact with this medication? Do not take this medicine with any of the following medications: cisapride dronedarone flibanserin lutetium Lu 177 dotatate pimozide saquinavir thioridazine This medicine may also interact with the following medications: bromocriptine certain medicines for blood pressure, heart disease, irregular heartbeat cyclosporine diuretics medicines for diabetes, including insulin quinidine This list may not describe all possible interactions. Give your health  care provider a list of all the medicines, herbs, non-prescription drugs, or dietary supplements you use. Also tell them if you smoke, drink alcohol, or use illegaldrugs. Some items may interact with your medicine. What should I watch for while using this medication? Visit your health care professional for regular checks on your progress. Tell your health care professional if your symptoms do not start to get better or ifthey get worse. This medicine may cause decreases in blood sugar. Signs of low blood sugar include chills, cool, pale skin or cold sweats, drowsiness, extreme hunger, fast heartbeat, headache, nausea, nervousness or anxiety, shakiness, trembling, unsteadiness, tiredness, or weakness. Contact your doctor or health careprofessional right away if you experience any of these symptoms. This medicine may increase blood sugar. Ask your healthcare provider if changesin diet or medicines are needed if you have diabetes. This medicine may cause a decrease in vitamin B12. You should make sure that you get enough vitamin B12 while you are taking this medicine. Discuss thefoods you eat and the vitamins you take with your health care professional. What side effects may I notice from receiving this medication? Side effects that you should report to your doctor or health care professionalas soon as possible: allergic reactions like skin rash, itching or hives, swelling of the face, lips, or tongue fast, slow, or irregular heartbeat right upper belly pain severe stomach pain signs and symptoms of high blood sugar such as being more thirsty or hungry or having to urinate more than normal. You may also feel very tired or have blurry vision. signs and symptoms of low blood sugar such as feeling anxious; confusion; dizziness; increased hunger; unusually weak or tired; increased sweating; shakiness; cold, clammy skin; irritable; headache; blurred vision; fast heartbeat; loss of consciousness unusually weak  or tired Side effects that usually do not require medical attention (report these toyour doctor or health care professional if they continue or are bothersome): diarrhea dizziness gas headache nausea, vomiting pain, redness, or irritation at site where injected upset stomach This list may not describe all possible side effects. Call your doctor for medical advice about side effects. You may report side effects to FDA at1-800-FDA-1088. Where should I keep my medication? This medicine is given in a hospital or clinic and will not be stored at home. NOTE: This sheet is a summary. It may not cover all possible information. If you have questions about this medicine, talk to your doctor, pharmacist, orhealth care provider.  2022 [employer] Standard (2019-08-02 18:31:50)

## 2020-11-30 ENCOUNTER — Other Ambulatory Visit: Payer: Self-pay | Admitting: Oncology

## 2020-11-30 DIAGNOSIS — C7A012 Malignant carcinoid tumor of the ileum: Secondary | ICD-10-CM

## 2020-12-11 ENCOUNTER — Encounter: Payer: Self-pay | Admitting: Oncology

## 2020-12-11 NOTE — Addendum Note (Signed)
Addended by: Juanetta Beets on: 12/11/2020 01:59 PM   Modules accepted: Orders

## 2020-12-13 ENCOUNTER — Inpatient Hospital Stay: Payer: Medicare HMO

## 2020-12-13 ENCOUNTER — Inpatient Hospital Stay: Payer: Medicare HMO | Admitting: Oncology

## 2020-12-14 ENCOUNTER — Encounter: Payer: Self-pay | Admitting: Oncology

## 2020-12-14 LAB — CBC AND DIFFERENTIAL
HCT: 35 — AB (ref 36–46)
Hemoglobin: 12.1 (ref 12.0–16.0)
Neutrophils Absolute: 3.85
Platelets: 235 (ref 150–399)
WBC: 10.4

## 2020-12-14 LAB — BASIC METABOLIC PANEL
BUN: 11 (ref 4–21)
CO2: 33 — AB (ref 13–22)
Chloride: 98 — AB (ref 99–108)
Creatinine: 0.8 (ref 0.5–1.1)
Glucose: 80
Potassium: 4.1 (ref 3.4–5.3)
Sodium: 136 — AB (ref 137–147)

## 2020-12-14 LAB — HEPATIC FUNCTION PANEL
ALT: 13 (ref 7–35)
AST: 27 (ref 13–35)
Alkaline Phosphatase: 80 (ref 25–125)
Bilirubin, Total: 0.2

## 2020-12-14 LAB — CBC: RBC: 3.64 — AB (ref 3.87–5.11)

## 2020-12-14 LAB — COMPREHENSIVE METABOLIC PANEL
Albumin: 4.1 (ref 3.5–5.0)
Calcium: 9.3 (ref 8.7–10.7)

## 2020-12-14 NOTE — Progress Notes (Signed)
[facility]  [address] [place],  [place]  [address] [phone]  Clinic Day:  12/17/2020  Referring physician: Barnetta Chapel, NP   This document serves as a record of services personally performed by Kristi Poisson, MD. It was created on their behalf by Curry,Lauren E, a trained medical scribe. The creation of this record is based on the scribe's personal observations and the provider's statements to them.  CHIEF COMPLAINT:  CC: History of neuroendocrine/carcinoid tumor   Current Treatment:  Octreotide injections  HISTORY OF PRESENT ILLNESS:  Kristi Romero is a 65 y.o. female referred by Dr. Melina Copa for a transfer of care and further management of her history of neuroendocrine and carcinoid tumor, diagnosed in October 2016.  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  She underwent surgical resection with Dr. 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  Seven of eighteen lymph nodes were involved (7/18).  Primary tumor measured 2.2 x 1.7 x 1.5 cm, with three apparent vascular metastases including a nodule up to 3 cm with metastatic tumor present within 1 mm of the inked radial margin.  In the distal appendix there was also a separate 3 mm carcinoid tumor.  This was a staged as a pT3pN1 with low mitotic rate (0 per 10 high powered fields).  She did require a PEG tube in 2017 for failure to thrive and this was removed in January 2018.  Her weight was up to 157 by September 2021.   Her last follow up was in January 2020 with Dr. Verl Blalock until we started seeing her in early February 2022.  Alycia complained of weight loss,  stomach pain and nausea.  She was evaluated by Dr. Melina Copa with endoscopy in November 2021 which revealed chronic gastritis and was negative for H.Pylori.  No evidence of malignancy was observed.  She also notes bilateral numbness "from her mouth to her feet".   She receives B12 injections every 6 months.  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  Chromogranin A from February was elevated at 262.3, but CT scan showed bilateral adrenal adenomas but no evidence of progressive disease.  She was started on octreotide injection on March 1st with good response based on her decreasing chromogranin A, last measuring 61.4 in June.    INTERVAL HISTORY:  Kristi Romero is here for routine follow up prior to her next octreotide.  CT chest, abdomen and pelvis from August 18th revealed postoperative changes of right hemicolectomy with enterocolonic anastomosis. No evidence of local recurrence or abdominopelvic metastatic disease.  Stable left adrenal gland adenoma.  MRI scan of the brain in August revealed evidence of chronic ischemia.  She reports feeling hungry, but when she starts to eat she gets nauseas.  She supplements with Ensure.  She has been evaluated by GI and they placed her on a PPI and Linzess and told her to decrease her marijuana use.  She feels the problem is with her taste with food.  She continues to have hot flashes and intermittent abdominal pain.  She needs refills for Linzess 290 mcg and hydrocodone 5/325.  She is averaging 2 hydrocodone daily.  Blood counts and chemistries  are unremarkable.  She has gained 2 and 1/2 pounds since her last visit.  She denies fever, chills or other signs of infection.  She denies vomiting.  She denies sore throat, cough, dyspnea, or chest pain.  REVIEW OF SYSTEMS:  Review of Systems  Constitutional:  Positive for appetite change (her appetite remains poor). Negative for chills, fatigue, fever and unexpected weight change.  HENT:   Negative.    Eyes: Negative.   Respiratory: Negative.  Negative for chest tightness, cough, hemoptysis, shortness of breath and wheezing.   Cardiovascular: Negative.  Negative for chest pain, leg swelling and palpitations.  Gastrointestinal:  Positive for abdominal pain (intermittent) and nausea. Negative for abdominal distention, blood in stool, constipation, diarrhea and vomiting.  Endocrine: Positive for hot flashes.  Genitourinary: Negative.  Negative for difficulty urinating, dysuria, frequency and hematuria.   Musculoskeletal:  Negative for arthralgias, back pain, flank pain, gait problem and myalgias.  Skin: Negative.   Neurological: Negative.  Negative for dizziness, extremity weakness, gait problem, headaches, light-headedness, numbness, seizures and speech difficulty.  Hematological: Negative.   Psychiatric/Behavioral: Negative.  Negative for depression and sleep disturbance. The patient is not nervous/anxious.     VITALS:  Blood pressure (!) 104/55, pulse 63, temperature 98.3 F (36.8 C), temperature source Oral, resp. rate 18, height '5\' 4"'$  (1.626 m), weight 133 lb 8 oz (60.6 kg), SpO2 96 %.  Wt Readings from Last 3 Encounters:  12/17/20 133 lb 8 oz (60.6 kg)  11/15/20 131 lb (59.4 kg)  11/15/20 131 lb (59.4 kg)    Body mass index is 22.92 kg/m.  Performance status (ECOG): 2 - Symptomatic, <50% confined to bed  PHYSICAL EXAM:  Physical Exam Constitutional:      General: She is not in acute distress.    Appearance: Normal appearance. She is normal weight.  HENT:     Head: Normocephalic and atraumatic.  Eyes:     General: No scleral icterus.    Extraocular Movements: Extraocular movements intact.     Conjunctiva/sclera: Conjunctivae normal.     Pupils: Pupils are equal, round, and reactive to light.  Cardiovascular:     Rate and Rhythm: Normal rate and regular rhythm.     Pulses: Normal pulses.     Heart sounds: Normal heart sounds. No murmur heard.   No friction  rub. No gallop.  Pulmonary:     Effort: Pulmonary effort is normal. No respiratory distress.     Breath sounds: Normal breath sounds.  Abdominal:     General: Bowel sounds are normal. There is no distension.     Palpations: Abdomen is soft. There is no hepatomegaly, splenomegaly or mass.     Tenderness: There is no abdominal tenderness.  Musculoskeletal:        General: Normal range of motion.     Cervical back: Normal range of motion and neck supple.     Right lower leg: No edema.     Left lower leg: No edema.  Lymphadenopathy:     Cervical: No cervical adenopathy.  Skin:    General: Skin is warm and dry.  Neurological:     General: No focal deficit present.     Mental Status: She is alert and oriented to person, place, and time. Mental status is at baseline.  Psychiatric:        Mood and Affect: Mood normal.        Behavior: Behavior normal.        Thought Content: Thought content normal.  Judgment: Judgment normal.    LABS:   CBC Latest Ref Rng & Units 12/14/2020 10/15/2020 09/17/2020  WBC - 10.4 9.7 9.4  Hemoglobin 12.0 - 16.0 12.1 12.5 13.0  Hematocrit 36 - 46 35(A) 37 39  Platelets 150 - 399 235 263 286   CMP Latest Ref Rng & Units 12/14/2020 10/15/2020 09/17/2020  Glucose 70 - 99 mg/dL - - -  BUN 4 - '21 11 10 9  '$ Creatinine 0.5 - 1.1 0.8 0.6 0.7  Sodium 137 - 147 136(A) 136(A) 138  Potassium 3.4 - 5.3 4.1 4.3 3.6  Chloride 99 - 108 98(A) 102 102  CO2 13 - 22 33(A) 28(A) 34(A)  Calcium 8.7 - 10.7 9.3 9.5 9.4  Total Protein 6.5 - 8.1 g/dL - - -  Total Bilirubin 0.3 - 1.2 mg/dL - - -  Alkaline Phos 25 - 125 80 92 111  AST 13 - 35 '27 20 21  '$ ALT 7 - 35 '13 10 9    '$ STUDIES:  EXAM: MRI HEAD WITHOUT AND WITH CONTRAST  TECHNIQUE: Multiplanar, multiecho pulse sequences of the brain and surrounding structures were obtained without and with intravenous contrast.  CONTRAST:  5 mL Gadavist intravenous contrast.  COMPARISON:  Brain MRI 06/09/2018 (images available,  report unavailable).  FINDINGS: Brain:  Mild intermittent motion degradation. This includes mild motion degradation of the axial T1 weighted postcontrast sequence.  Cerebral volume is normal.  Mild multifocal T2/FLAIR hyperintensity within the cerebral white matter and pons, nonspecific but most often secondary to chronic small vessel ischemia.  Redemonstrated small focus of T2* signal loss within the left basal ganglia, likely reflecting a site of remote hemorrhage or remote hemorrhagic lacunar infarct.  There is no acute infarct.  No evidence of an intracranial mass.  No extra-axial fluid collection.  No midline shift.  No pathologic intracranial enhancement is identified to suggest intracranial metastatic disease.  Vascular: Maintained flow voids within the proximal large arterial vessels.  Skull and upper cervical spine: No focal suspicious marrow lesion.  Sinuses/Orbits: Visualized orbits show no acute finding. Opacification of an anterior right ethmoid air cell.  Other: Mucosal thickening within the right mastoid air cells.  IMPRESSION: Mildly motion degraded exam.  No evidence of acute intracranial abnormality.  No evidence of intracranial metastatic disease.  Redemonstrated mild multifocal T2/FLAIR hyperintense signal abnormality within the cerebral white matter and pons, nonspecific but most often secondary to chronic small vessel ischemia.  Redemonstrated signal abnormality within the left basal ganglia, likely reflecting a site of remote hemorrhage or a remote hemorrhagic lacunar infarct.  Minimal right ethmoid sinusitis.  Trace fluid within the right mastoid air cells.  EXAM: 12/13/2020 CT ABDOMEN AND PELVIS WITH CONTRAST  TECHNIQUE: Multidetector CT imaging of the abdomen and pelvis was performed using the standard protocol following bolus administration of intravenous contrast.  CONTRAST:  80 cc of Isovue 370  COMPARISON:  Multiple  priors including most recent CT June 15, 2020  FINDINGS: Lower chest: No acute abnormality. Aortic and coronary artery calcifications.  Hepatobiliary: No suspicious hepatic lesion. Gallbladder surgically absent. No biliary ductal dilation.  Pancreas: Within normal limits.  Spleen: Within normal limits.  Adrenals/Urinary Tract: Unchanged size of the left adrenal gland adenoma measuring 2.0 cm on image 21/2 stable since 2019. Right adrenal glands unremarkable. No hydronephrosis. No solid enhancing renal mass. Urinary bladder is unremarkable for degree of distension.  Stomach/Bowel: Postoperative changes of right hemicolectomy with enterocolonic anastomosis. No suspicious soft tissue nodularity at the anastomosis. Radiopaque enteric  contrast traverses the rectum. Moderate volume of formed stool throughout the colon. Stomach is unremarkable. No pathologic dilation of small bowel..  Vascular/Lymphatic: Aortic and branch vessel atherosclerosis without abdominal aortic aneurysm. No pathologically enlarged abdominal or pelvic lymph nodes visualized.  Reproductive: Status post hysterectomy. No adnexal masses.  Other: No abdominopelvic free fluid. No discrete peritoneal nodularity or omental caking.  Musculoskeletal: Multilevel degenerative changes spine. No aggressive lytic or blastic lesion of bone.  IMPRESSION: 1. Postoperative changes of right hemicolectomy with enterocolonic anastomosis. No evidence of local recurrence or abdominopelvic metastatic disease. 2. Moderate volume of formed stool throughout the colon. 3. Stable left adrenal gland adenoma. 4.  Aortic Atherosclerosis (ICD10-I70.0).   HISTORY:   Allergies:  Allergies  Allergen Reactions   Codeine Nausea Only   Lisinopril Swelling    Current Medications: Current Outpatient Medications  Medication Sig Dispense Refill   famotidine (PEPCID) 20 MG tablet Take 40 mg by mouth at bedtime. (Patient not taking:  No sig reported)     HYDROcodone-acetaminophen (NORCO/VICODIN) 5-325 MG tablet Take 1 tablet by mouth every 4 (four) hours as needed for moderate pain. 60 tablet 0   linaclotide (LINZESS) 290 MCG CAPS capsule Take 1 capsule (290 mcg total) by mouth daily as needed. (Patient not taking: Reported on 11/15/2020) 30 capsule 0   metoprolol tartrate (LOPRESSOR) 25 MG tablet Take 1 tablet (25 mg total) by mouth 2 (two) times daily. (Patient not taking: Reported on 11/15/2020) 60 tablet 2   mirtazapine (REMERON) 15 MG tablet Take 1 tablet (15 mg total) by mouth at bedtime. 30 tablet 5   ondansetron (ZOFRAN ODT) 4 MG disintegrating tablet Take 1 tablet (4 mg total) by mouth every 8 (eight) hours as needed for nausea or vomiting. 40 tablet 0   pantoprazole (PROTONIX) 40 MG tablet Take 1 tablet (40 mg total) by mouth 2 (two) times daily before a meal. (Patient taking differently: Take 40 mg by mouth daily.) 60 tablet 2   potassium chloride SA (KLOR-CON) 20 MEQ tablet Take 1 tablet (20 mEq total) by mouth 3 (three) times daily. (Patient not taking: Reported on 11/15/2020) 21 tablet 0   tiZANidine (ZANAFLEX) 4 MG tablet Take 4 mg by mouth as needed for muscle spasms. (Patient not taking: Reported on 11/15/2020)     No current facility-administered medications for this visit.   ASSESSMENT & PLAN:   Assessment:   1.  History of neuroendocrine/carcinoid tumor of the ascending colon, diagnosed in October 2016, treated with surgical resection.  Seven of eighteen lymph nodes were involved (7/18).  Primary tumor measured 2.2 x 1.7 x 1.5 cm, with three apparent vascular metastases including a nodule up to 3 cm with metastatic tumor present within 1 mm of the inked radial margin.  In the distal appendix there was also a separate 3 mm carcinoid tumor.  This was a staged as a pT3pN1 with low mitotic rate (0 per 10 high powered fields).  [name] was sporadic.  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  24 hour urine for 5HIAA was normal.  Chromogranin A from February was elevated at 262.3, but CT imaging was stable with no evidence of residual or recurrent disease.  She was placed on monthly octreotide injections in March 2022 with good response based on her symptoms and her decreasing chromogranin A, last measuring 48.8 in August.  CT imaging from August remains without evidence  of recurrent or metastatic disease.  2.  Weight loss, abdominal pain and nausea. She has gained 2 and 1/2 pounds since her last visit.  She uses zofran with partial relief and Protonix.  She will continue to follow with Dr. Lyndel Safe.    3. Abdominal pain. This has been chronic and she has never taken anything for it. We tried hydrocodone and she states this helped some. I did agree to refill it but emphasized the associated constipation and need to continue daily Linzess.    4.  We need to look for any central nervous system etiology for her problems and got an MRI of the brain which does show chronic ischemic changes.  It could be related to her marijuana usage as well as her depression.  Plan: CT imaging remains without evidence of recurrent or metastatic disease.  Her chromogranin A also continues to decrease.  She will continue with monthly octreotide injections. She knows to continue with protein shakes if she can't tolerate solid foods.  I will refill hydrocodone 5/325 for her pain as well as Linzess for constipation.  We will see her back in 4 weeks with CBC, CMP and chromogranin A for repeat evaluation.  Both her and her aide verbalize understanding of and agreement to the plans discussed today. They know to call the office should any new questions or concerns arise.    I provided 15 minutes of face-to-face time during this this encounter and > 50% was spent counseling as documented under my assessment and plan.   Derwood Kaplan, MD [facility] AT [facility] [address] [place] [address] Dept: [phone] Dept Fax: [phone]     I, Rita Ohara, am acting as scribe for Derwood Kaplan, MD  I have reviewed this report as typed by the medical scribe, and it is complete and accurate.

## 2020-12-17 ENCOUNTER — Other Ambulatory Visit: Payer: Self-pay | Admitting: Oncology

## 2020-12-17 ENCOUNTER — Telehealth: Payer: Self-pay | Admitting: Oncology

## 2020-12-17 ENCOUNTER — Other Ambulatory Visit: Payer: Self-pay | Admitting: Hematology and Oncology

## 2020-12-17 ENCOUNTER — Other Ambulatory Visit: Payer: Self-pay

## 2020-12-17 ENCOUNTER — Inpatient Hospital Stay: Payer: Medicare HMO

## 2020-12-17 ENCOUNTER — Inpatient Hospital Stay: Payer: Medicare HMO | Attending: Oncology | Admitting: Oncology

## 2020-12-17 ENCOUNTER — Encounter: Payer: Self-pay | Admitting: Oncology

## 2020-12-17 VITALS — BP 104/55 | HR 63 | Temp 98.3°F | Resp 18 | Ht 64.0 in | Wt 133.5 lb

## 2020-12-17 VITALS — BP 110/64 | HR 61 | Temp 98.3°F | Resp 18 | Ht 64.0 in | Wt 133.5 lb

## 2020-12-17 DIAGNOSIS — C7A012 Malignant carcinoid tumor of the ileum: Secondary | ICD-10-CM

## 2020-12-17 DIAGNOSIS — Z79899 Other long term (current) drug therapy: Secondary | ICD-10-CM | POA: Diagnosis not present

## 2020-12-17 MED ORDER — LINACLOTIDE 290 MCG PO CAPS: 290.0000 ug | ORAL_CAPSULE | Freq: Every day | ORAL | 0 refills | Status: DC | PRN

## 2020-12-17 MED ORDER — HYDROCODONE-ACETAMINOPHEN 5-325 MG PO TABS
1.0000 | ORAL_TABLET | ORAL | 0 refills | Status: DC | PRN
Start: 2020-12-17 — End: 2021-03-14

## 2020-12-17 MED ORDER — OCTREOTIDE ACETATE 20 MG IM KIT
20.0000 mg | PACK | Freq: Once | INTRAMUSCULAR | Status: AC
  Administered 2020-12-17: 20 mg via INTRAMUSCULAR
  Filled 2020-12-17: qty 1

## 2020-12-17 NOTE — Telephone Encounter (Signed)
Per 8/22 LOS, patient scheduled for Sept Appt's.  Gave patient Appt Summary 

## 2020-12-17 NOTE — Patient Instructions (Signed)
Octreotide acetate injection suspension What is this medication? OCTREOTIDE (ok TREE oh tide) is used to reduce blood levels of growth hormone in patients with a condition called acromegaly. This medicine also reducesflushing and watery diarrhea caused by certain types of cancer. This medicine may be used for other purposes; ask your health care provider orpharmacist if you have questions. COMMON BRAND NAME(S): Sandostatin LAR What should I tell my care team before I take this medication? They need to know if you have any of these conditions: diabetes gallbladder disease kidney disease liver disease thyroid disease an unusual or allergic reaction to octreotide, other medicines, foods, dyes, or preservatives pregnant or trying to get pregnant breast-feeding How should I use this medication? This medicine is for injection into a muscle. It is usually given by a healthcare professional in a hospital or clinic setting. Talk to your pediatrician regarding the use of this medicine in children.Special care may be needed. Overdosage: If you think you have taken too much of this medicine contact apoison control center or emergency room at once. NOTE: This medicine is only for you. Do not share this medicine with others. What if I miss a dose? Keep appointments for follow-up doses. It is important not to miss your dose. Call your doctor or health care professional if you are unable to keep anappointment. What may interact with this medication? Do not take this medicine with any of the following medications: cisapride dronedarone flibanserin lutetium Lu 177 dotatate pimozide saquinavir thioridazine This medicine may also interact with the following medications: bromocriptine certain medicines for blood pressure, heart disease, irregular heartbeat cyclosporine diuretics medicines for diabetes, including insulin quinidine This list may not describe all possible interactions. Give your health  care provider a list of all the medicines, herbs, non-prescription drugs, or dietary supplements you use. Also tell them if you smoke, drink alcohol, or use illegaldrugs. Some items may interact with your medicine. What should I watch for while using this medication? Visit your health care professional for regular checks on your progress. Tell your health care professional if your symptoms do not start to get better or ifthey get worse. This medicine may cause decreases in blood sugar. Signs of low blood sugar include chills, cool, pale skin or cold sweats, drowsiness, extreme hunger, fast heartbeat, headache, nausea, nervousness or anxiety, shakiness, trembling, unsteadiness, tiredness, or weakness. Contact your doctor or health careprofessional right away if you experience any of these symptoms. This medicine may increase blood sugar. Ask your healthcare provider if changesin diet or medicines are needed if you have diabetes. This medicine may cause a decrease in vitamin B12. You should make sure that you get enough vitamin B12 while you are taking this medicine. Discuss thefoods you eat and the vitamins you take with your health care professional. What side effects may I notice from receiving this medication? Side effects that you should report to your doctor or health care professionalas soon as possible: allergic reactions like skin rash, itching or hives, swelling of the face, lips, or tongue fast, slow, or irregular heartbeat right upper belly pain severe stomach pain signs and symptoms of high blood sugar such as being more thirsty or hungry or having to urinate more than normal. You may also feel very tired or have blurry vision. signs and symptoms of low blood sugar such as feeling anxious; confusion; dizziness; increased hunger; unusually weak or tired; increased sweating; shakiness; cold, clammy skin; irritable; headache; blurred vision; fast heartbeat; loss of consciousness unusually weak  or tired Side effects that usually do not require medical attention (report these toyour doctor or health care professional if they continue or are bothersome): diarrhea dizziness gas headache nausea, vomiting pain, redness, or irritation at site where injected upset stomach This list may not describe all possible side effects. Call your doctor for medical advice about side effects. You may report side effects to FDA at1-800-FDA-1088. Where should I keep my medication? This medicine is given in a hospital or clinic and will not be stored at home. NOTE: This sheet is a summary. It may not cover all possible information. If you have questions about this medicine, talk to your doctor, pharmacist, orhealth care provider.  2022 [employer] Standard (2019-08-02 18:31:50)

## 2020-12-18 ENCOUNTER — Encounter: Payer: Self-pay | Admitting: Oncology

## 2020-12-19 ENCOUNTER — Encounter: Payer: Self-pay | Admitting: Oncology

## 2021-01-07 NOTE — Progress Notes (Signed)
[facility]  [address] [place],  [place]  [address] [phone]  Clinic Day:  01/14/2021  Referring physician: Barnetta Chapel, NP   This document serves as a record of services personally performed by Hosie Poisson, MD. It was created on their behalf by Curry,Lauren E, a trained medical scribe. The creation of this record is based on the scribe's personal observations and the provider's statements to them.  CHIEF COMPLAINT:  CC: History of neuroendocrine/carcinoid tumor   Current Treatment:  Octreotide injections  HISTORY OF PRESENT ILLNESS:  Kristi Romero is a 65 y.o. female referred by Dr. Melina Copa for a transfer of care and further management of her history of neuroendocrine and carcinoid tumor, diagnosed in October 2016.  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  She underwent surgical resection with Dr. 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  Seven of eighteen lymph nodes were involved (7/18).  Primary tumor measured 2.2 x 1.7 x 1.5 cm, with three apparent vascular metastases including a nodule up to 3 cm with metastatic tumor present within 1 mm of the inked radial margin.  In the distal appendix there was also a separate 3 mm carcinoid tumor.  This was a staged as a pT3pN1 with low mitotic rate (0 per 10 high powered fields).  She did require a PEG tube in 2017 for failure to thrive and this was removed in January 2018.  Her weight was up to 157 by September 2021.   Her last follow up was in January 2020 with Dr. Verl Blalock until we started seeing her in early February 2022.  Kristi Romero complained of weight loss,  stomach pain and nausea.  She was evaluated by Dr. Melina Copa with endoscopy in November 2021 which revealed chronic gastritis and was negative for H.Pylori.  No evidence of malignancy was observed.  She also notes bilateral numbness "from her mouth to her feet".   She receives B12 injections every 6 months.  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  Chromogranin A from February was elevated at 262.3, but CT scan showed bilateral adrenal adenomas but no evidence of progressive disease.  She was started on octreotide injection on March 1st with good response based on her decreasing chromogranin A, last measuring 61.4 in June.  CT chest, abdomen and pelvis from August 18th revealed postoperative changes of right hemicolectomy with enterocolonic anastomosis. No evidence of local recurrence or abdominopelvic metastatic disease.  Stable left adrenal gland adenoma.  MRI scan of the brain in August revealed evidence of chronic ischemia.  Chromogranin A from August ws 48.8 down from 61.4.  INTERVAL HISTORY:  Kristi Romero is here for routine follow up prior to her next octreotide. She still continues to struggle with her appetite due to nausea, but supplements with Ensure. She also notes generalized pain as well as abdominal pain. She continues Pepcid and Protonix as prescribed, but has not been evaluated by GI lately. She is scheduled with Dr. Melina Copa again on November 17th. I will try her on olanzapine 5 mg at bedtime for nausea. She has been on mirtazapine 15 mg at bedtime. Her  appetite is poor secondary to nausea, and  she has gained 1 pound since her last visit.  She denies fever, chills or other signs of infection.  She denies vomiting or bowel issues.  She denies sore throat, cough, dyspnea, or chest pain.  REVIEW OF SYSTEMS:  Review of Systems  Constitutional:  Negative for appetite change, chills, fatigue, fever and unexpected weight change.       Generalized pain  HENT:   Negative.    Eyes: Negative.   Respiratory: Negative.  Negative for chest tightness, cough, hemoptysis, shortness of breath and wheezing.   Cardiovascular: Negative.  Negative for chest pain, leg swelling and palpitations.  Gastrointestinal:  Positive for abdominal pain (chronic) and nausea. Negative for abdominal distention, blood in stool, constipation, diarrhea and vomiting.  Endocrine: Negative for hot flashes.  Genitourinary: Negative.  Negative for difficulty urinating, dysuria, frequency and hematuria.   Musculoskeletal:  Negative for arthralgias, back pain, flank pain, gait problem and myalgias.  Skin: Negative.   Neurological: Negative.  Negative for dizziness, extremity weakness, gait problem, headaches, light-headedness, numbness, seizures and speech difficulty.  Hematological: Negative.   Psychiatric/Behavioral: Negative.  Negative for depression and sleep disturbance. The patient is not nervous/anxious.    - appetite change VITALS:  Blood pressure (!) 111/57, pulse (!) 58, temperature 98.2 F (36.8 C), temperature source Oral, resp. rate 18, height '5\' 4"'$  (1.626 m), weight 134 lb 4.8 oz (60.9 kg), SpO2 95 %.  Wt Readings from Last 3 Encounters:  01/14/21 134 lb 4.8 oz (60.9 kg)  12/17/20 133 lb 8 oz (60.6 kg)  12/17/20 133 lb 8 oz (60.6 kg)    Body mass index is 23.05 kg/m.  Performance status (ECOG): 2 - Symptomatic, <50% confined to bed  PHYSICAL EXAM:  Physical Exam Constitutional:      General: She is not in acute distress.    Appearance: Normal appearance. She is normal weight.  HENT:     Head: Normocephalic and atraumatic.  Eyes:     General: No scleral icterus.    Extraocular Movements: Extraocular movements intact.     Conjunctiva/sclera: Conjunctivae normal.     Pupils: Pupils are equal, round, and reactive to light.  Cardiovascular:     Rate and Rhythm: Regular rhythm. Bradycardia present.     Pulses: Normal pulses.     Heart sounds: Normal heart sounds.  No murmur heard.   No friction rub. No gallop.  Pulmonary:     Effort: Pulmonary effort is normal. No respiratory distress.     Breath sounds: Normal breath sounds.  Abdominal:     General: Bowel sounds are normal. There is no distension.     Palpations: Abdomen is soft. There is no hepatomegaly, splenomegaly or mass.     Tenderness: There is no abdominal tenderness.  Musculoskeletal:        General: Normal range of motion.     Cervical back: Normal range of motion and neck supple.     Right lower leg: No edema.     Left lower leg: No edema.  Lymphadenopathy:     Cervical: No cervical adenopathy.  Skin:    General: Skin is warm and dry.  Neurological:     General: No focal deficit present.     Mental Status: She is alert and oriented to person, place, and time. Mental status is at baseline.  Psychiatric:        Mood and Affect: Mood normal.        Behavior: Behavior normal.  Thought Content: Thought content normal.        Judgment: Judgment normal.    LABS:   CBC Latest Ref Rng & Units 12/14/2020 10/15/2020 09/17/2020  WBC - 10.4 9.7 9.4  Hemoglobin 12.0 - 16.0 12.1 12.5 13.0  Hematocrit 36 - 46 35(A) 37 39  Platelets 150 - 399 235 263 286   CMP Latest Ref Rng & Units 12/14/2020 10/15/2020 09/17/2020  Glucose 70 - 99 mg/dL - - -  BUN 4 - '21 11 10 9  '$ Creatinine 0.5 - 1.1 0.8 0.6 0.7  Sodium 137 - 147 136(A) 136(A) 138  Potassium 3.4 - 5.3 4.1 4.3 3.6  Chloride 99 - 108 98(A) 102 102  CO2 13 - 22 33(A) 28(A) 34(A)  Calcium 8.7 - 10.7 9.3 9.5 9.4  Total Protein 6.5 - 8.1 g/dL - - -  Total Bilirubin 0.3 - 1.2 mg/dL - - -  Alkaline Phos 25 - 125 80 92 111  AST 13 - 35 '27 20 21  '$ ALT 7 - 35 '13 10 9    '$ STUDIES:    HISTORY:   Allergies:  Allergies  Allergen Reactions   Codeine Nausea Only   Lisinopril Swelling    Current Medications: Current Outpatient Medications  Medication Sig Dispense Refill   famotidine (PEPCID) 20 MG tablet Take 40 mg by mouth at  bedtime. (Patient not taking: No sig reported)     HYDROcodone-acetaminophen (NORCO/VICODIN) 5-325 MG tablet Take 1 tablet by mouth every 4 (four) hours as needed for moderate pain. 60 tablet 0   linaclotide (LINZESS) 290 MCG CAPS capsule Take 1 capsule (290 mcg total) by mouth daily as needed. 30 capsule 0   metoprolol tartrate (LOPRESSOR) 25 MG tablet Take 1 tablet (25 mg total) by mouth 2 (two) times daily. (Patient not taking: Reported on 11/15/2020) 60 tablet 2   mirtazapine (REMERON) 15 MG tablet Take 1 tablet (15 mg total) by mouth at bedtime. 30 tablet 5   ondansetron (ZOFRAN ODT) 4 MG disintegrating tablet Take 1 tablet (4 mg total) by mouth every 8 (eight) hours as needed for nausea or vomiting. 40 tablet 0   pantoprazole (PROTONIX) 40 MG tablet Take 1 tablet (40 mg total) by mouth 2 (two) times daily before a meal. (Patient taking differently: Take 40 mg by mouth daily.) 60 tablet 2   potassium chloride SA (KLOR-CON) 20 MEQ tablet Take 1 tablet (20 mEq total) by mouth 3 (three) times daily. (Patient not taking: Reported on 11/15/2020) 21 tablet 0   tiZANidine (ZANAFLEX) 4 MG tablet Take 4 mg by mouth as needed for muscle spasms. (Patient not taking: Reported on 11/15/2020)     No current facility-administered medications for this visit.   ASSESSMENT & PLAN:   Assessment:   1.  History of neuroendocrine/carcinoid tumor of the ascending colon, diagnosed in October 2016, treated with surgical resection.  Seven of eighteen lymph nodes were involved (7/18).  Primary tumor measured 2.2 x 1.7 x 1.5 cm, with three apparent vascular metastases including a nodule up to 3 cm with metastatic tumor present within 1 mm of the inked radial margin.  In the distal appendix there was also a separate 3 mm carcinoid tumor.  This was a staged as a pT3pN1 with low mitotic rate (0 per 10 high powered fields).  [name] was sporadic.  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  24  hour urine for 5HIAA was normal.  Chromogranin A from February was elevated at 262.3, but CT imaging was stable with no evidence of residual or recurrent disease.  She was placed on monthly octreotide injections in March 2022 with good response based on her symptoms and her decreasing chromogranin A, last measuring 48.8 in August.  CT imaging from August remains without evidence of recurrent or metastatic disease.  2.  Weight loss, abdominal pain and nausea. She has gained 1 pound since her last visit.  She will continue to follow with Dr. Lyndel Safe, but is not scheduled until November.    3. Abdominal pain. This has been chronic and she has never taken anything for it. We tried hydrocodone and she states this helped some. I emphasized the associated constipation and need to continue daily Linzess.    4.  We searched for any central nervous system etiology for her problems and got an MRI of the brain which does show chronic ischemic changes.  It could be related to her marijuana usage as well as her depression.  Plan: She will continue with monthly octreotide injections. She knows to continue with protein shakes if she can't tolerate solid foods.  I will send in olanzapine 5 mg at bedtime to try and improve her nausea. If this persists, then I advise that she follow up with Dr. Melina Copa for GI evaluation. She is not scheduled with him until November, and I will see if this could be moved up sooner. We will see her back in 4 weeks with CBC and CMP for repeat evaluation.  Both her and her aide verbalize understanding of and agreement to the plans discussed today. They know to call the office should any new questions or concerns arise.    I provided 15 minutes of face-to-face time during this this encounter and > 50% was spent counseling as documented under my assessment and plan.   Derwood Kaplan, MD [facility] [address] [place] [address] Dept: [phone] Dept Fax: [phone]     I, Rita Ohara, am acting as scribe for Derwood Kaplan, MD  I have reviewed this report as typed by the medical scribe, and it is complete and accurate.

## 2021-01-14 ENCOUNTER — Inpatient Hospital Stay: Payer: Medicare HMO

## 2021-01-14 ENCOUNTER — Inpatient Hospital Stay (INDEPENDENT_AMBULATORY_CARE_PROVIDER_SITE_OTHER): Payer: Medicare HMO | Admitting: Oncology

## 2021-01-14 ENCOUNTER — Encounter: Payer: Self-pay | Admitting: Oncology

## 2021-01-14 ENCOUNTER — Other Ambulatory Visit: Payer: Self-pay

## 2021-01-14 ENCOUNTER — Other Ambulatory Visit: Payer: Self-pay | Admitting: Oncology

## 2021-01-14 ENCOUNTER — Inpatient Hospital Stay: Payer: Medicare HMO | Attending: Oncology

## 2021-01-14 ENCOUNTER — Other Ambulatory Visit: Payer: Self-pay | Admitting: Hematology and Oncology

## 2021-01-14 VITALS — BP 110/80 | HR 65 | Temp 98.9°F | Resp 18 | Ht 64.0 in | Wt 128.2 lb

## 2021-01-14 VITALS — BP 111/57 | HR 58 | Temp 98.2°F | Resp 18 | Ht 64.0 in | Wt 134.3 lb

## 2021-01-14 DIAGNOSIS — C7A012 Malignant carcinoid tumor of the ileum: Secondary | ICD-10-CM

## 2021-01-14 DIAGNOSIS — Z79899 Other long term (current) drug therapy: Secondary | ICD-10-CM | POA: Diagnosis not present

## 2021-01-14 DIAGNOSIS — R112 Nausea with vomiting, unspecified: Secondary | ICD-10-CM

## 2021-01-14 DIAGNOSIS — K5909 Other constipation: Secondary | ICD-10-CM

## 2021-01-14 LAB — HEPATIC FUNCTION PANEL
ALT: 13 (ref 7–35)
AST: 26 (ref 13–35)
Alkaline Phosphatase: 80 (ref 25–125)
Bilirubin, Total: 0.3

## 2021-01-14 LAB — BASIC METABOLIC PANEL
BUN: 13 (ref 4–21)
CO2: 27 — AB (ref 13–22)
Chloride: 102 (ref 99–108)
Creatinine: 0.7 (ref 0.5–1.1)
Glucose: 129
Potassium: 3.9 (ref 3.4–5.3)
Sodium: 139 (ref 137–147)

## 2021-01-14 LAB — CBC AND DIFFERENTIAL
HCT: 40 (ref 36–46)
Hemoglobin: 13.3 (ref 12.0–16.0)
Neutrophils Absolute: 3.33
Platelets: 220 (ref 150–399)
WBC: 10.1

## 2021-01-14 LAB — CBC: RBC: 4.13 (ref 3.87–5.11)

## 2021-01-14 LAB — COMPREHENSIVE METABOLIC PANEL
Albumin: 4.3 (ref 3.5–5.0)
Calcium: 10 (ref 8.7–10.7)

## 2021-01-14 MED ORDER — OCTREOTIDE ACETATE 20 MG IM KIT
20.0000 mg | PACK | Freq: Once | INTRAMUSCULAR | Status: AC
  Administered 2021-01-14: 20 mg via INTRAMUSCULAR
  Filled 2021-01-14: qty 1

## 2021-01-14 MED ORDER — OLANZAPINE 5 MG PO TABS: 5.0000 mg | ORAL_TABLET | Freq: Every day | ORAL | 5 refills | Status: DC

## 2021-01-14 NOTE — Patient Instructions (Signed)
Octreotide acetate injection suspension What is this medication? OCTREOTIDE (ok TREE oh tide) is used to reduce blood levels of growth hormone in patients with a condition called acromegaly. This medicine also reduces flushing and watery diarrhea caused by certain types of cancer. This medicine may be used for other purposes; ask your health care provider or pharmacist if you have questions. COMMON BRAND NAME(S): Sandostatin LAR What should I tell my care team before I take this medication? They need to know if you have any of these conditions: diabetes gallbladder disease kidney disease liver disease thyroid disease an unusual or allergic reaction to octreotide, other medicines, foods, dyes, or preservatives pregnant or trying to get pregnant breast-feeding How should I use this medication? This medicine is for injection into a muscle. It is usually given by a health care professional in a hospital or clinic setting. Talk to your pediatrician regarding the use of this medicine in children. Special care may be needed. Overdosage: If you think you have taken too much of this medicine contact a poison control center or emergency room at once. NOTE: This medicine is only for you. Do not share this medicine with others. What if I miss a dose? Keep appointments for follow-up doses. It is important not to miss your dose. Call your doctor or health care professional if you are unable to keep an appointment. What may interact with this medication? Do not take this medicine with any of the following medications: cisapride dronedarone flibanserin lutetium Lu 177 dotatate pimozide saquinavir thioridazine This medicine may also interact with the following medications: bromocriptine certain medicines for blood pressure, heart disease, irregular heartbeat cyclosporine diuretics medicines for diabetes, including insulin quinidine This list may not describe all possible interactions. Give your  health care provider a list of all the medicines, herbs, non-prescription drugs, or dietary supplements you use. Also tell them if you smoke, drink alcohol, or use illegal drugs. Some items may interact with your medicine. What should I watch for while using this medication? Visit your health care professional for regular checks on your progress. Tell your health care professional if your symptoms do not start to get better or if they get worse. This medicine may cause decreases in blood sugar. Signs of low blood sugar include chills, cool, pale skin or cold sweats, drowsiness, extreme hunger, fast heartbeat, headache, nausea, nervousness or anxiety, shakiness, trembling, unsteadiness, tiredness, or weakness. Contact your doctor or health care professional right away if you experience any of these symptoms. This medicine may increase blood sugar. Ask your healthcare provider if changes in diet or medicines are needed if you have diabetes. This medicine may cause a decrease in vitamin B12. You should make sure that you get enough vitamin B12 while you are taking this medicine. Discuss the foods you eat and the vitamins you take with your health care professional. What side effects may I notice from receiving this medication? Side effects that you should report to your doctor or health care professional as soon as possible: allergic reactions like skin rash, itching or hives, swelling of the face, lips, or tongue fast, slow, or irregular heartbeat right upper belly pain severe stomach pain signs and symptoms of high blood sugar such as being more thirsty or hungry or having to urinate more than normal. You may also feel very tired or have blurry vision. signs and symptoms of low blood sugar such as feeling anxious; confusion; dizziness; increased hunger; unusually weak or tired; increased sweating; shakiness; cold, clammy skin;   irritable; headache; blurred vision; fast heartbeat; loss of  consciousness unusually weak or tired Side effects that usually do not require medical attention (report these to your doctor or health care professional if they continue or are bothersome): diarrhea dizziness gas headache nausea, vomiting pain, redness, or irritation at site where injected upset stomach This list may not describe all possible side effects. Call your doctor for medical advice about side effects. You may report side effects to FDA at 1-800-FDA-1088. Where should I keep my medication? This medicine is given in a hospital or clinic and will not be stored at home. NOTE: This sheet is a summary. It may not cover all possible information. If you have questions about this medicine, talk to your doctor, pharmacist, or health care provider.  2022 Elsevier/Gold Standard (2019-08-02 18:31:50)

## 2021-01-15 LAB — CHROMOGRANIN A: Chromogranin A (ng/mL): 51 ng/mL (ref 0.0–101.8)

## 2021-01-16 ENCOUNTER — Encounter: Payer: Self-pay | Admitting: Oncology

## 2021-01-18 ENCOUNTER — Encounter: Payer: Self-pay | Admitting: Oncology

## 2021-02-05 NOTE — Progress Notes (Signed)
[facility]  [address] [place] [address],  [place]  [address] [phone]  Clinic Day:  02/11/2021  Referring physician: Barnetta Chapel, NP   This document serves as a record of services personally performed by Hosie Poisson, MD. It was created on their behalf by Curry,Lauren E, a trained medical scribe. The creation of this record is based on the scribe's personal observations and the provider's statements to them.  CHIEF COMPLAINT:  CC: History of neuroendocrine/carcinoid tumor   Current Treatment:  Octreotide injections  HISTORY OF PRESENT ILLNESS:  Kristi Romero is a 65 y.o. female referred by Dr. Melina Copa for a transfer of care and further management of her history of neuroendocrine and carcinoid tumor, diagnosed in October 2016.  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  She underwent surgical resection with Dr. 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  Seven of eighteen lymph nodes were involved (7/18).  Primary tumor measured 2.2 x 1.7 x 1.5 cm, with three apparent vascular metastases including a nodule up to 3 cm with metastatic tumor present within 1 mm of the inked radial margin.  In the distal appendix there was also a separate 3 mm carcinoid tumor.  This was a staged as a pT3pN1 with low mitotic rate (0 per 10 high powered fields).  She did require a PEG tube in 2017 for failure to thrive and this was removed in January 2018.  Her weight was up to 157 by September 2021.   Her last follow up was in January 2020 with Dr. Verl Blalock until we started seeing her in early February 2022.  Drew complained of weight loss,  stomach pain and nausea.  She was evaluated by Dr. Melina Copa with endoscopy in November 2021 which revealed chronic gastritis and was negative for H.Pylori.  No evidence of malignancy was observed.  She also notes bilateral numbness "from her mouth to her feet".   She receives B12 injections every 6 months.  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  Chromogranin A from February was elevated at 262.3, but CT scan showed bilateral adrenal adenomas but no evidence of progressive disease.  She was started on octreotide injection on March 1st with good response based on her decreasing chromogranin A, last measuring 61.4 in June.  CT chest, abdomen and pelvis from August 18th revealed postoperative changes of right hemicolectomy with enterocolonic anastomosis. No evidence of local recurrence or abdominopelvic metastatic disease.  Stable left adrenal gland adenoma.  MRI scan of the brain in August revealed evidence of chronic ischemia.  Chromogranin A from August ws 48.8 down from 61.4. She is scheduled with Dr. Melina Copa again on November 17th.  INTERVAL HISTORY:  Kristi Romero is here for routine follow up prior to her next octreotide. She continues to complain of severe nausea and inability to eat despite having a good appetite. She complains of upper abdominal pain, 8/10, worse at night. She stated "I think I need a feeding tube again". I asked her to discuss this with her PCP and Dr. Melina Copa. She complains of weakness of the lower extremities and pain as well as tingling and numbness of her hands. Her last chromogranin  A had decreased from 61.4 to 51.0 in September. She has lost 3 1/2 more pounds in 1 months time.  She denies fever, chills or other signs of infection.  She denies vomiting or bowel issues.  She denies sore throat, cough, dyspnea, or chest pain.  REVIEW OF SYSTEMS:  Review of Systems  Constitutional:  Negative for appetite change, chills, fatigue, fever and  unexpected weight change.       Generalized pain  HENT:  Negative.    Eyes: Negative.   Respiratory: Negative.  Negative for chest tightness, cough, hemoptysis, shortness of breath and wheezing.   Cardiovascular: Negative.  Negative for chest pain, leg swelling and palpitations.  Gastrointestinal:  Positive for abdominal pain (chronic) and nausea. Negative for abdominal distention, blood in stool, constipation, diarrhea and vomiting.  Endocrine: Negative for hot flashes.  Genitourinary: Negative.  Negative for difficulty urinating, dysuria, frequency and hematuria.   Musculoskeletal:  Negative for arthralgias, back pain, flank pain, gait problem and myalgias.  Skin: Negative.   Neurological:  Positive for extremity weakness (lower extremities) and numbness (and paresthesia of the lower extremities). Negative for dizziness, gait problem, headaches, light-headedness, seizures and speech difficulty.  Hematological: Negative.   Psychiatric/Behavioral: Negative.  Negative for depression and sleep disturbance. The patient is not nervous/anxious.     VITALS:  Blood pressure (!) 128/59, pulse (!) 55, temperature 98.4 F (36.9 C), temperature source Oral, resp. rate 18, height 5\' 4"  (1.626 m), weight 130 lb 6.4 oz (59.1 kg), SpO2 98 %.  Wt Readings from Last 3 Encounters:  02/11/21 130 lb 6.4 oz (59.1 kg)  01/14/21 128 lb 4 oz (58.2 kg)  01/14/21 134 lb 4.8 oz (60.9 kg)    Body mass index is 22.38 kg/m.  Performance status (ECOG): 2 - Symptomatic, <50% confined to bed  PHYSICAL EXAM:  Physical Exam Constitutional:      General: She is not in acute distress.    Appearance: Normal appearance. She is normal weight.  HENT:     Head: Normocephalic and atraumatic.  Eyes:     General: No scleral icterus.    Extraocular Movements: Extraocular movements intact.     Conjunctiva/sclera: Conjunctivae normal.     Pupils: Pupils are equal, round, and reactive to light.  Cardiovascular:     Rate and  Rhythm: Normal rate and regular rhythm.     Pulses: Normal pulses.     Heart sounds: Normal heart sounds. No murmur heard.   No friction rub. No gallop.  Pulmonary:     Effort: Pulmonary effort is normal. No respiratory distress.     Breath sounds: Normal breath sounds.  Abdominal:     General: Bowel sounds are normal. There is no distension.     Palpations: Abdomen is soft. There is no hepatomegaly, splenomegaly or mass.     Tenderness: There is abdominal tenderness in the epigastric area.  Musculoskeletal:        General: Normal range of motion.     Cervical back: Normal range of motion and neck supple.     Right lower leg: No edema.     Left lower leg: No edema.  Lymphadenopathy:     Cervical: No cervical adenopathy.  Skin:    General: Skin is warm and dry.  Neurological:     General: No focal deficit present.     Mental Status: She is alert and oriented to person, place, and time. Mental status is at baseline.     Motor: Weakness (she  remains wheelchair bound) present.  Psychiatric:        Mood and Affect: Mood normal.        Behavior: Behavior normal.        Thought Content: Thought content normal.        Judgment: Judgment normal.    LABS:   CBC Latest Ref Rng & Units 02/11/2021 01/14/2021 12/14/2020  WBC - 8.7 10.1 10.4  Hemoglobin 12.0 - 16.0 13.3 13.3 12.1  Hematocrit 36 - 46 39 40 35(A)  Platelets 150 - 399 221 220 235   CMP Latest Ref Rng & Units 02/11/2021 01/14/2021 12/14/2020  Glucose 70 - 99 mg/dL - - -  BUN 4 - 21 - 13 11  Creatinine 0.5 - 1.1 0.8 0.7 0.8  Sodium 137 - 147 141 139 136(A)  Potassium 3.4 - 5.3 4.0 3.9 4.1  Chloride 99 - 108 103 102 98(A)  CO2 13 - 22 33(A) 27(A) 33(A)  Calcium 8.7 - 10.7 9.7 10.0 9.3  Total Protein 6.5 - 8.1 g/dL - - -  Total Bilirubin 0.3 - 1.2 mg/dL - - -  Alkaline Phos 25 - 125 91 80 80  AST 13 - 35 24 26 27   ALT 7 - 35 14 13 13     STUDIES:    HISTORY:   Allergies:  Allergies  Allergen Reactions   Codeine  Nausea Only   Lisinopril Swelling    Current Medications: Current Outpatient Medications  Medication Sig Dispense Refill   famotidine (PEPCID) 20 MG tablet Take 40 mg by mouth at bedtime. (Patient not taking: No sig reported)     HYDROcodone-acetaminophen (NORCO/VICODIN) 5-325 MG tablet Take 1 tablet by mouth every 4 (four) hours as needed for moderate pain. 60 tablet 0   LINZESS 290 MCG CAPS capsule TAKE 1 CAPSULE(290 MCG) BY MOUTH DAILY AS NEEDED 30 capsule 0   metoprolol tartrate (LOPRESSOR) 25 MG tablet Take 1 tablet (25 mg total) by mouth 2 (two) times daily. (Patient not taking: Reported on 11/15/2020) 60 tablet 2   mirtazapine (REMERON) 15 MG tablet Take 1 tablet (15 mg total) by mouth at bedtime. 30 tablet 5   OLANZapine (ZYPREXA) 5 MG tablet Take 1 tablet (5 mg total) by mouth at bedtime. 30 tablet 5   ondansetron (ZOFRAN ODT) 4 MG disintegrating tablet Take 1 tablet (4 mg total) by mouth every 8 (eight) hours as needed for nausea or vomiting. 40 tablet 0   pantoprazole (PROTONIX) 40 MG tablet Take 1 tablet (40 mg total) by mouth 2 (two) times daily before a meal. (Patient taking differently: Take 40 mg by mouth daily.) 60 tablet 2   potassium chloride SA (KLOR-CON) 20 MEQ tablet Take 1 tablet (20 mEq total) by mouth 3 (three) times daily. (Patient not taking: Reported on 11/15/2020) 21 tablet 0   tiZANidine (ZANAFLEX) 4 MG tablet Take 4 mg by mouth as needed for muscle spasms. (Patient not taking: Reported on 11/15/2020)     No current facility-administered medications for this visit.   ASSESSMENT & PLAN:   Assessment:   1.  History of neuroendocrine/carcinoid tumor of the ascending colon, diagnosed in October 2016, treated with surgical resection.  Seven of eighteen lymph nodes were involved (7/18).  Primary tumor measured 2.2 x 1.7 x 1.5 cm, with three apparent vascular metastases including a nodule up to 3 cm with metastatic tumor present within 1 mm of the inked radial margin.  In  the distal appendix there was also a separate 3  mm carcinoid tumor.  This was a staged as a pT3pN1 with low mitotic rate (0 per 10 high powered fields).  [name] was sporadic.  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  24 hour urine for 5HIAA was normal.  Chromogranin A from February was elevated at 262.3, but CT imaging was stable with no evidence of residual or recurrent disease.  She was placed on monthly octreotide injections in March 2022 with good response based on her symptoms and her decreasing chromogranin A, last measuring 48.8 in August.  CT imaging from August remains without evidence of recurrent or metastatic disease.  2.  Weight loss, abdominal pain and nausea. She has lost 3 and 1/2 pound since her last visit.  She will continue to follow with Dr. Melina Copa, but is not scheduled until November.  I had recommended a trial of olanzapine, but I do not know if the patient tried it and I am out of suggestions.  3. Abdominal pain. This has been chronic and she has never taken anything for it. We tried hydrocodone and she states this helped some. I emphasized the associated constipation and need to continue daily Linzess.    4.  We searched for any central nervous system etiology for her problems and got an MRI of the brain which does show chronic ischemic changes.  It could be related to her marijuana usage as well as her depression.  Plan: She will continue with monthly octreotide injections. She knows to continue with protein shakes if she can't tolerate solid foods. She is not scheduled with Dr. Melina Copa until November, and I will see if this could be moved up sooner. We will see her back in 4 weeks with CBC and CMP for repeat evaluation.  Both her and her aid verbalize understanding of and agreement to the plans discussed today. They know to call the office should any new questions or concerns arise.     I provided 15 minutes of face-to-face time during this this encounter and > 50% was spent counseling as documented under my assessment and plan.   Derwood Kaplan, MD [facility] [address] [place] [address] Dept: [phone] Dept Fax: [phone]     I, Rita Ohara, am acting as scribe for Derwood Kaplan, MD  I have reviewed this report as typed by the medical scribe, and it is complete and accurate.

## 2021-02-11 ENCOUNTER — Telehealth: Payer: Self-pay | Admitting: Oncology

## 2021-02-11 ENCOUNTER — Other Ambulatory Visit: Payer: Self-pay | Admitting: Oncology

## 2021-02-11 ENCOUNTER — Inpatient Hospital Stay: Payer: Medicare HMO | Attending: Oncology

## 2021-02-11 ENCOUNTER — Other Ambulatory Visit: Payer: Self-pay | Admitting: Hematology and Oncology

## 2021-02-11 ENCOUNTER — Inpatient Hospital Stay (INDEPENDENT_AMBULATORY_CARE_PROVIDER_SITE_OTHER): Payer: Medicare HMO | Admitting: Oncology

## 2021-02-11 ENCOUNTER — Other Ambulatory Visit: Payer: Self-pay

## 2021-02-11 ENCOUNTER — Encounter: Payer: Self-pay | Admitting: Oncology

## 2021-02-11 ENCOUNTER — Inpatient Hospital Stay: Payer: Medicare HMO

## 2021-02-11 VITALS — BP 128/59 | HR 55 | Temp 98.4°F | Resp 18 | Ht 64.0 in | Wt 130.4 lb

## 2021-02-11 DIAGNOSIS — C7A012 Malignant carcinoid tumor of the ileum: Secondary | ICD-10-CM | POA: Insufficient documentation

## 2021-02-11 DIAGNOSIS — K5900 Constipation, unspecified: Secondary | ICD-10-CM | POA: Insufficient documentation

## 2021-02-11 DIAGNOSIS — Z79899 Other long term (current) drug therapy: Secondary | ICD-10-CM | POA: Diagnosis not present

## 2021-02-11 DIAGNOSIS — R109 Unspecified abdominal pain: Secondary | ICD-10-CM | POA: Insufficient documentation

## 2021-02-11 LAB — CBC AND DIFFERENTIAL
HCT: 39 (ref 36–46)
Hemoglobin: 13.3 (ref 12.0–16.0)
Neutrophils Absolute: 3.05
Platelets: 221 (ref 150–399)
WBC: 8.7

## 2021-02-11 LAB — COMPREHENSIVE METABOLIC PANEL
Albumin: 4.5 (ref 3.5–5.0)
Calcium: 9.7 (ref 8.7–10.7)

## 2021-02-11 LAB — BASIC METABOLIC PANEL
CO2: 33 — AB (ref 13–22)
Chloride: 103 (ref 99–108)
Creatinine: 0.8 (ref 0.5–1.1)
Glucose: 103
Potassium: 4 (ref 3.4–5.3)
Sodium: 141 (ref 137–147)

## 2021-02-11 LAB — HEPATIC FUNCTION PANEL
ALT: 14 (ref 7–35)
AST: 24 (ref 13–35)
Alkaline Phosphatase: 91 (ref 25–125)
Bilirubin, Total: 0.3

## 2021-02-11 LAB — CBC: RBC: 4.12 (ref 3.87–5.11)

## 2021-02-11 MED ORDER — OCTREOTIDE ACETATE 20 MG IM KIT
20.0000 mg | PACK | Freq: Once | INTRAMUSCULAR | Status: AC
  Administered 2021-02-11: 20 mg via INTRAMUSCULAR
  Filled 2021-02-11: qty 1

## 2021-02-11 NOTE — Progress Notes (Signed)
1153: PT STABLE AT TIME OF DISCHARGE

## 2021-02-11 NOTE — Telephone Encounter (Signed)
Per 10/17 los next appt scheduled and given to patient

## 2021-02-11 NOTE — Patient Instructions (Signed)
Octreotide acetate injection suspension What is this medication? OCTREOTIDE (ok TREE oh tide) is used to reduce blood levels of growth hormone in patients with a condition called acromegaly. This medicine also reduces flushing and watery diarrhea caused by certain types of cancer. This medicine may be used for other purposes; ask your health care provider or pharmacist if you have questions. COMMON BRAND NAME(S): Sandostatin LAR What should I tell my care team before I take this medication? They need to know if you have any of these conditions: diabetes gallbladder disease kidney disease liver disease thyroid disease an unusual or allergic reaction to octreotide, other medicines, foods, dyes, or preservatives pregnant or trying to get pregnant breast-feeding How should I use this medication? This medicine is for injection into a muscle. It is usually given by a health care professional in a hospital or clinic setting. Talk to your pediatrician regarding the use of this medicine in children. Special care may be needed. Overdosage: If you think you have taken too much of this medicine contact a poison control center or emergency room at once. NOTE: This medicine is only for you. Do not share this medicine with others. What if I miss a dose? Keep appointments for follow-up doses. It is important not to miss your dose. Call your doctor or health care professional if you are unable to keep an appointment. What may interact with this medication? Do not take this medicine with any of the following medications: cisapride dronedarone flibanserin lutetium Lu 177 dotatate pimozide saquinavir thioridazine This medicine may also interact with the following medications: bromocriptine certain medicines for blood pressure, heart disease, irregular heartbeat cyclosporine diuretics medicines for diabetes, including insulin quinidine This list may not describe all possible interactions. Give your  health care provider a list of all the medicines, herbs, non-prescription drugs, or dietary supplements you use. Also tell them if you smoke, drink alcohol, or use illegal drugs. Some items may interact with your medicine. What should I watch for while using this medication? Visit your health care professional for regular checks on your progress. Tell your health care professional if your symptoms do not start to get better or if they get worse. This medicine may cause decreases in blood sugar. Signs of low blood sugar include chills, cool, pale skin or cold sweats, drowsiness, extreme hunger, fast heartbeat, headache, nausea, nervousness or anxiety, shakiness, trembling, unsteadiness, tiredness, or weakness. Contact your doctor or health care professional right away if you experience any of these symptoms. This medicine may increase blood sugar. Ask your healthcare provider if changes in diet or medicines are needed if you have diabetes. This medicine may cause a decrease in vitamin B12. You should make sure that you get enough vitamin B12 while you are taking this medicine. Discuss the foods you eat and the vitamins you take with your health care professional. What side effects may I notice from receiving this medication? Side effects that you should report to your doctor or health care professional as soon as possible: allergic reactions like skin rash, itching or hives, swelling of the face, lips, or tongue fast, slow, or irregular heartbeat right upper belly pain severe stomach pain signs and symptoms of high blood sugar such as being more thirsty or hungry or having to urinate more than normal. You may also feel very tired or have blurry vision. signs and symptoms of low blood sugar such as feeling anxious; confusion; dizziness; increased hunger; unusually weak or tired; increased sweating; shakiness; cold, clammy skin;   irritable; headache; blurred vision; fast heartbeat; loss of  consciousness unusually weak or tired Side effects that usually do not require medical attention (report these to your doctor or health care professional if they continue or are bothersome): diarrhea dizziness gas headache nausea, vomiting pain, redness, or irritation at site where injected upset stomach This list may not describe all possible side effects. Call your doctor for medical advice about side effects. You may report side effects to FDA at 1-800-FDA-1088. Where should I keep my medication? This medicine is given in a hospital or clinic and will not be stored at home. NOTE: This sheet is a summary. It may not cover all possible information. If you have questions about this medicine, talk to your doctor, pharmacist, or health care provider.  2022 Elsevier/Gold Standard (2019-08-02 18:31:50)

## 2021-02-15 ENCOUNTER — Encounter: Payer: Self-pay | Admitting: Oncology

## 2021-02-26 ENCOUNTER — Encounter: Payer: Self-pay | Admitting: Oncology

## 2021-03-05 DIAGNOSIS — I361 Nonrheumatic tricuspid (valve) insufficiency: Secondary | ICD-10-CM

## 2021-03-07 ENCOUNTER — Encounter: Payer: Self-pay | Admitting: Oncology

## 2021-03-07 NOTE — Addendum Note (Signed)
Addended by: Juanetta Beets on: 03/07/2021 09:00 AM   Modules accepted: Orders

## 2021-03-11 ENCOUNTER — Other Ambulatory Visit: Payer: Self-pay

## 2021-03-11 ENCOUNTER — Inpatient Hospital Stay: Payer: Medicare HMO

## 2021-03-11 ENCOUNTER — Telehealth: Payer: Self-pay | Admitting: Hematology and Oncology

## 2021-03-11 ENCOUNTER — Inpatient Hospital Stay: Payer: Medicare HMO | Attending: Oncology | Admitting: Hematology and Oncology

## 2021-03-11 ENCOUNTER — Encounter: Payer: Self-pay | Admitting: Hematology and Oncology

## 2021-03-11 VITALS — BP 116/54 | HR 57 | Temp 98.3°F | Resp 18 | Ht 64.0 in | Wt 139.0 lb

## 2021-03-11 DIAGNOSIS — C7A012 Malignant carcinoid tumor of the ileum: Secondary | ICD-10-CM

## 2021-03-11 DIAGNOSIS — Z79899 Other long term (current) drug therapy: Secondary | ICD-10-CM | POA: Diagnosis not present

## 2021-03-11 LAB — CBC AND DIFFERENTIAL
HCT: 39 (ref 36–46)
Hemoglobin: 13.1 (ref 12.0–16.0)
Neutrophils Absolute: 3.87
Platelets: 248 (ref 150–399)
WBC: 9

## 2021-03-11 LAB — HEPATIC FUNCTION PANEL
ALT: 20 (ref 7–35)
AST: 27 (ref 13–35)
Alkaline Phosphatase: 71 (ref 25–125)
Bilirubin, Total: 0.2

## 2021-03-11 LAB — PROTEIN, TOTAL: Total Protein: 8.6 g/dL — AB (ref 6.3–8.2)

## 2021-03-11 LAB — BASIC METABOLIC PANEL
BUN: 18 (ref 4–21)
CO2: 34 — AB (ref 13–22)
Chloride: 101 (ref 99–108)
Creatinine: 0.7 (ref 0.5–1.1)
Glucose: 145
Potassium: 3.5 (ref 3.4–5.3)
Sodium: 141 (ref 137–147)

## 2021-03-11 LAB — COMPREHENSIVE METABOLIC PANEL
Albumin: 4.5 (ref 3.5–5.0)
Calcium: 9.9 (ref 8.7–10.7)

## 2021-03-11 LAB — CBC
MCV: 96 (ref 81–99)
RBC: 4.06 (ref 3.87–5.11)

## 2021-03-11 MED ORDER — OCTREOTIDE ACETATE 20 MG IM KIT
20.0000 mg | PACK | Freq: Once | INTRAMUSCULAR | Status: AC
  Administered 2021-03-11: 20 mg via INTRAMUSCULAR
  Filled 2021-03-11: qty 1

## 2021-03-11 NOTE — Patient Instructions (Signed)
Octreotide injection solution What is this medication? OCTREOTIDE (ok TREE oh tide) is used to reduce blood levels of growth hormone in patients with a condition called acromegaly. This medicine also reduces flushing and watery diarrhea caused by certain types of cancer. This medicine may be used for other purposes; ask your health care provider or pharmacist if you have questions. COMMON BRAND NAME(S): Bynfezia, Sandostatin What should I tell my care team before I take this medication? They need to know if you have any of these conditions: diabetes gallbladder disease kidney disease liver disease thyroid disease an unusual or allergic reaction to octreotide, other medicines, foods, dyes, or preservatives pregnant or trying to get pregnant breast-feeding How should I use this medication? This medicine is for injection under the skin or into a vein (only in emergency situations). It is usually given by a health care professional in a hospital or clinic setting. If you get this medicine at home, you will be taught how to prepare and give this medicine. Allow the injection solution to come to room temperature before use. Do not warm it artificially. Use exactly as directed. Take your medicine at regular intervals. Do not take your medicine more often than directed. It is important that you put your used needles and syringes in a special sharps container. Do not put them in a trash can. If you do not have a sharps container, call your pharmacist or healthcare provider to get one. Talk to your pediatrician regarding the use of this medicine in children. Special care may be needed. Overdosage: If you think you have taken too much of this medicine contact a poison control center or emergency room at once. NOTE: This medicine is only for you. Do not share this medicine with others. What if I miss a dose? If you miss a dose, take it as soon as you can. If it is almost time for your next dose, take only  that dose. Do not take double or extra doses. What may interact with this medication? bromocriptine certain medicines for blood pressure, heart disease, irregular heartbeat cyclosporine diuretics medicines for diabetes, including insulin quinidine This list may not describe all possible interactions. Give your health care provider a list of all the medicines, herbs, non-prescription drugs, or dietary supplements you use. Also tell them if you smoke, drink alcohol, or use illegal drugs. Some items may interact with your medicine. What should I watch for while using this medication? Visit your doctor or health care professional for regular checks on your progress. To help reduce irritation at the injection site, use a different site for each injection and make sure the solution is at room temperature before use. This medicine may cause decreases in blood sugar. Signs of low blood sugar include chills, cool, pale skin or cold sweats, drowsiness, extreme hunger, fast heartbeat, headache, nausea, nervousness or anxiety, shakiness, trembling, unsteadiness, tiredness, or weakness. Contact your doctor or health care professional right away if you experience any of these symptoms. This medicine may increase blood sugar. Ask your healthcare provider if changes in diet or medicines are needed if you have diabetes. This medicine may cause a decrease in vitamin B12. You should make sure that you get enough vitamin B12 while you are taking this medicine. Discuss the foods you eat and the vitamins you take with your health care professional. What side effects may I notice from receiving this medication? Side effects that you should report to your doctor or health care professional as soon as   possible: allergic reactions like skin rash, itching or hives, swelling of the face, lips, or tongue fast, slow, or irregular heartbeat right upper belly pain severe stomach pain signs and symptoms of high blood sugar such  as being more thirsty or hungry or having to urinate more than normal. You may also feel very tired or have blurry vision. signs and symptoms of low blood sugar such as feeling anxious; confusion; dizziness; increased hunger; unusually weak or tired; increased sweating; shakiness; cold, clammy skin; irritable; headache; blurred vision; fast heartbeat; loss of consciousness unusually weak or tired Side effects that usually do not require medical attention (report to your doctor or health care professional if they continue or are bothersome): diarrhea dizziness gas headache nausea, vomiting pain, redness, or irritation at site where injected upset stomach This list may not describe all possible side effects. Call your doctor for medical advice about side effects. You may report side effects to FDA at 1-800-FDA-1088. Where should I keep my medication? Keep out of the reach of children. Store in a refrigerator between 2 and 8 degrees C (36 and 46 degrees F). Protect from light. Allow to come to room temperature naturally. Do not use artificial heat. If protected from light, the injection may be stored at room temperature between 20 and 30 degrees C (70 and 86 degrees F) for 14 days. After the initial use, throw away any unused portion of a multiple dose vial after 14 days. Throw away unused portions of the ampules after use. NOTE: This sheet is a summary. It may not cover all possible information. If you have questions about this medicine, talk to your doctor, pharmacist, or health care provider.  2022 [employer]vier/Gold Standard (2018-11-11 00:00:00)  

## 2021-03-11 NOTE — Telephone Encounter (Signed)
Per 11/14 os next appt scheduled and given to patient

## 2021-03-11 NOTE — Progress Notes (Signed)
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 She will proceed with that today. She has chronic abdominal pain of uncertain etiology improved with hydrocodone/APAP. I will refill that today. We will plan to see her back in 4 weeks with a CBC and comprehensive metabolic panel prior to her next octreotide.    The patient understands the plans discussed today and is in agreement with them.  She knows to contact our office if she develops concerns prior to her next appointment.     Kristi Pickles, PA-C  [facility] AT [facility] [address] [place] [address] Dept: [phone] Dept Fax: [phone]   Orders Placed This Encounter  Procedures   CBC and differential    This external order was created through the Results Console.   CBC    This external order was created through the Results Console.   Basic metabolic panel    This external order was created through the Results Console.   Comprehensive metabolic panel    This external order was created through the Results Console.   Hepatic function panel    This external order was created through the Results Console.   Protein, total    This order was created through External Result Entry   CBC    This order was created through External Result Entry   CBC with Differential ([place] Only)    Standing Status:   Future    Standing Expiration Date:   03/12/2022   CMP ([place] only)    Standing Status:   Future    Standing Expiration Date:   03/12/2022   Chromogranin A    Standing Status:   Future    Standing Expiration Date:   03/12/2022       CHIEF COMPLAINT:  CC: Malignant neuroendocrine  carcinoid tumor  Current Treatment:  Octreotide every 4 weeks   HISTORY OF PRESENT ILLNESS:  Kristi Romero is a 65 year old. female referred by Dr. Melina Copa for a transfer of care and further management of her history of neuroendocrine and carcinoid tumor, which was diagnosed in October 2016.  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  She underwent surgical resection with Dr. Zada Girt. Pathology confirmed well differentiated neuroendocrine tumor consistent with carcinoid tumor.  Seven of eighteen lymph nodes were involved (7/18).  Primary tumor measured 2.2 x 1.7 x 1.5 cm, with three apparent vascular metastases including a nodule up to 3 cm with metastatic tumor present within 1 mm of the inked radial margin.  In the distal appendix there was also a separate 3 mm carcinoid tumor.  This was a staged as a pT3pN1 with low mitotic rate (0 per 10 high powered fields).  She did require a PEG tube in 2017 for failure to thrive and this was removed in January 2018.  Her weight was up to 157 by September 2021.   Her last follow up was in January 2020 with Dr. Verl Blalock until we started seeing her  in early February 2022.  [place]ia complained of weight loss, stomach pain and nausea.  She was evaluated by Dr. Melina Copa with endoscopy in November 2021 which revealed chronic gastritis and was negative for H.Pylori.  No evidence of malignancy was observed.  She also notes bilateral numbness "from her mouth to her feet".   She receives B12 injections every 6 months.  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  Chromogranin A from February was elevated at 262.3. She had an elevated serum protein. Serum protein electrophoresis did  not reveal a monoclonal spike. CT imaging revealed bilateral adrenal adenomas but no obvious evidence of recurrent/progressive disease.  She was started on octreotide injection in March 2022 with good response based on her decreasing chromogranin A, last measuring 61.4 in June.  CT chest, abdomen and pelvis in August revealed postoperative changes of right hemicolectomy with enterocolonic anastomosis without evidence of local recurrence or abdominopelvic metastatic disease.  Stable left adrenal gland adenoma.  MRI scan of the brain in August revealed evidence of chronic ischemia.  Chromogranin A from August ws 48.8 down from 61.4. The chromogranin A was 51 in September.  She continues octreotide every 4 weeks. She is scheduled with Dr. Melina Copa again on November 17th.  INTERVAL HISTORY:  Kristi Romero is here today for repeat clinical assessment prior to her next octreotide. She continues to report hot flashes. She continues to report intermittent upper abdominal pain for which we gave her hydrocodone/APAP.  She requests a refill of that today. She continues to report nausea whenever she eats solid food despite ondansetron. She states her constipation is controlled with Linzess.  She is using nutritional supplements 5-6 times daily. She denies fevers or chills. She reports chronic arthralgias and paresthesias of her hands, feet and legs. Her appetite is fair. Her weight has increased 7 pounds over last month .  She has difficulty sleeping despite being on mirtazapine.  REVIEW OF SYSTEMS:  Review of Systems  Constitutional:  Positive for appetite change (decreased, using supplements) and unexpected weight change (gain despite no solid food). Negative for chills, fatigue and fever.  HENT:   Negative for lump/mass, mouth sores and sore throat.   Respiratory:  Negative for cough and shortness of breath.   Cardiovascular:  Negative for chest pain and leg swelling.  Gastrointestinal:  Positive for abdominal pain  (improved with hydrocodone/apap), constipation (controlled with Linzess) and nausea (whenever she eats). Negative for diarrhea and vomiting.  Endocrine: Positive for hot flashes.  Genitourinary:  Negative for difficulty urinating, dysuria, frequency and hematuria.   Musculoskeletal:  Positive for arthralgias (chronic, hands, feet and legs). Negative for back pain and myalgias.  Skin:  Negative for rash.  Neurological:  Negative for dizziness and headaches.  Hematological:  Negative for adenopathy. Does not bruise/bleed easily.  Psychiatric/Behavioral:  Positive for sleep disturbance (on medication). Negative for depression. The patient is not nervous/anxious.     VITALS:  Blood pressure 107/60, pulse 64, resp. rate 18, height 5\' 4"  (1.626 m), weight 137 lb 6.4 oz (62.3 kg), SpO2 96 %.  Wt Readings from Last 3 Encounters:  03/11/21 139 lb (63 kg)  03/11/21 137 lb 6.4 oz (62.3 kg)  02/11/21 130 lb 6.4 oz (59.1 kg)    Body mass index is 23.58 kg/m.  Performance status (ECOG): 2 - Symptomatic, <50% confined to bed  PHYSICAL EXAM:  Physical Exam Vitals and nursing note reviewed.  Constitutional:      General: She is  not in acute distress.    Appearance: Normal appearance.  HENT:     Head: Normocephalic and atraumatic.     Mouth/Throat:     Mouth: Mucous membranes are moist.     Pharynx: Oropharynx is clear. No oropharyngeal exudate or posterior oropharyngeal erythema.  Eyes:     General: No scleral icterus.    Extraocular Movements: Extraocular movements intact.     Conjunctiva/sclera: Conjunctivae normal.     Pupils: Pupils are equal, round, and reactive to light.  Cardiovascular:     Rate and Rhythm: Normal rate and regular rhythm.     Heart sounds: Normal heart sounds. No murmur heard.   No friction rub. No gallop.  Pulmonary:     Effort: Pulmonary effort is normal.     Breath sounds: Normal breath sounds. No wheezing, rhonchi or rales.  Abdominal:     General: There is no  distension.     Palpations: Abdomen is soft. There is no hepatomegaly, splenomegaly or mass.     Tenderness: There is abdominal tenderness in the epigastric area.  Musculoskeletal:        General: Normal range of motion.     Cervical back: Normal range of motion and neck supple. No tenderness.     Right lower leg: No edema.     Left lower leg: No edema.  Lymphadenopathy:     Cervical: No cervical adenopathy.  Skin:    General: Skin is warm and dry.     Coloration: Skin is not jaundiced.     Findings: No rash.  Neurological:     Mental Status: She is alert and oriented to person, place, and time.     Cranial Nerves: No cranial nerve deficit.  Psychiatric:        Mood and Affect: Mood normal.        Behavior: Behavior normal.        Thought Content: Thought content normal.    LABS:   CBC Latest Ref Rng & Units 03/11/2021 02/11/2021 01/14/2021  WBC - 9.0 8.7 10.1  Hemoglobin 12.0 - 16.0 13.1 13.3 13.3  Hematocrit 36 - 46 39 39 40  Platelets 150 - 399 248 221 220   CMP Latest Ref Rng & Units 03/11/2021 02/11/2021 01/14/2021  Glucose 70 - 99 mg/dL - - -  BUN 4 - 21 18 - 13  Creatinine 0.5 - 1.1 0.7 0.8 0.7  Sodium 137 - 147 141 141 139  Potassium 3.4 - 5.3 3.5 4.0 3.9  Chloride 99 - 108 101 103 102  CO2 13 - 22 34(A) 33(A) 27(A)  Calcium 8.7 - 10.7 9.9 9.7 10.0  Total Protein 6.3 - 8.2 g/dL 8.6(A) - -  Total Bilirubin 0.3 - 1.2 mg/dL - - -  Alkaline Phos 25 - 125 71 91 80  AST 13 - 35 27 24 26   ALT 7 - 35 20 14 13      No results found for: CEA1 / No results found for: CEA1 No results found for: PSA1 No results found for: PTW656 No results found for: CLE751  Lab Results  Component Value Date   TOTALPROTELP 6.6 06/01/2020   ALBUMINELP 3.1 06/01/2020   A1GS 0.3 06/01/2020   A2GS 0.8 06/01/2020   BETS 1.1 06/01/2020   GAMS 1.4 06/01/2020   MSPIKE Not Observed 06/01/2020   SPEI Comment 06/01/2020   No results found for: TIBC, FERRITIN, IRONPCTSAT No results found  for: LDH  STUDIES:  No results found.  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 Dr Harl Favor [place]    COLONOSCOPY  09/05/2020   Dr Orlena Sheldon Benign neoplasm of transverse colon. Benign neoplasm of descending colon.   ESOPHAGOGASTRODUODENOSCOPY  02/2018   ESOPHAGOGASTRODUODENOSCOPY (EGD) WITH PROPOFOL N/A 03/16/2018   Procedure: ESOPHAGOGASTRODUODENOSCOPY (EGD) WITH PROPOFOL;  Surgeon: Ladene Artist, MD;  Location: [facility];  Service: Endoscopy;  Laterality: N/A;   PARTIAL HYSTERECTOMY     TOE SURGERY Bilateral     Family History  Problem Relation Age of Onset   Lung cancer Father    Heart disease Father    Hypertension Father    Heart disease Mother    Hypertension Mother    Cancer Mother     Social History:  reports that she has quit smoking. She has never used smokeless tobacco. She reports current drug use. Drug: Marijuana. She reports that she does not drink alcohol.The patient is accompanied by her caregiver today.  Allergies:  Allergies  Allergen Reactions   Codeine Nausea Only   Lisinopril Swelling    Current Medications: Current Outpatient Medications  Medication Sig Dispense Refill   famotidine (PEPCID) 20 MG tablet Take 40 mg by mouth at bedtime.     metoprolol tartrate (LOPRESSOR) 25 MG tablet Take 1 tablet (25 mg total) by mouth 2 (two) times daily. 60 tablet 2   potassium chloride SA (KLOR-CON) 20 MEQ tablet Take 1 tablet (20 mEq total) by mouth 3 (three) times daily. 21 tablet 0   tiZANidine (ZANAFLEX) 4 MG tablet Take 4 mg by mouth as needed  for muscle spasms.     HYDROcodone-acetaminophen (NORCO/VICODIN) 5-325 MG tablet Take 1 tablet by mouth every 4 (four) hours as needed for moderate pain. 60 tablet 0   LINZESS 290 MCG CAPS capsule TAKE 1 CAPSULE(290 MCG) BY MOUTH DAILY AS NEEDED 30 capsule 0   mirtazapine (REMERON) 15 MG tablet Take 1 tablet (15 mg total) by mouth at bedtime. 30 tablet 5   OLANZapine (ZYPREXA) 5 MG tablet Take 1 tablet (5 mg total) by mouth at bedtime. 30 tablet 5   ondansetron (ZOFRAN ODT) 4 MG disintegrating tablet Take 1 tablet (4 mg total) by mouth every 8 (eight) hours as needed for nausea or vomiting. 40 tablet 0   pantoprazole (PROTONIX) 40 MG tablet Take 1 tablet (40 mg total) by mouth 2 (two) times daily before a meal. (Patient taking differently: Take 40 mg by mouth daily.) 60 tablet 2   No current facility-administered medications for this visit.

## 2021-03-11 NOTE — Assessment & Plan Note (Addendum)
Chromogranin A has been decreasing the monthly octreotide. She will proceed with that today. She has chronic abdominal pain of uncertain etiology improved with hydrocodone/APAP. I will refill that today. We will plan to see her back in 4 weeks with a CBC and comprehensive metabolic panel prior to her next octreotide.

## 2021-03-11 NOTE — Progress Notes (Signed)
Discharged home stable. Home health aide with patient.

## 2021-03-12 ENCOUNTER — Encounter: Payer: Self-pay | Admitting: Oncology

## 2021-03-14 ENCOUNTER — Ambulatory Visit (INDEPENDENT_AMBULATORY_CARE_PROVIDER_SITE_OTHER): Payer: Medicare HMO | Admitting: Podiatry

## 2021-03-14 ENCOUNTER — Other Ambulatory Visit: Payer: Self-pay | Admitting: Hematology and Oncology

## 2021-03-14 ENCOUNTER — Other Ambulatory Visit: Payer: Self-pay

## 2021-03-14 ENCOUNTER — Encounter: Payer: Self-pay | Admitting: Podiatry

## 2021-03-14 DIAGNOSIS — Q828 Other specified congenital malformations of skin: Secondary | ICD-10-CM | POA: Diagnosis not present

## 2021-03-14 DIAGNOSIS — M5386 Other specified dorsopathies, lumbar region: Secondary | ICD-10-CM

## 2021-03-14 DIAGNOSIS — C7A012 Malignant carcinoid tumor of the ileum: Secondary | ICD-10-CM

## 2021-03-14 MED ORDER — HYDROCODONE-ACETAMINOPHEN 5-325 MG PO TABS: 1.0000 | ORAL_TABLET | ORAL | 0 refills | Status: DC | PRN

## 2021-03-14 NOTE — Progress Notes (Signed)
  Subjective:  Patient ID: Kristi Romero, female    DOB: Feb 04, 1956,  MRN: [id number]  Chief Complaint  Patient presents with   Foot Pain    I have some tingling and numbness in the toes and pain goes up the hips and I am not a diabetic    65 y.o. female presents with the above complaint. History confirmed with patient. Complains of intermittent numbness to BLE including toes that shoots up the leg/hip area.  Complains of callused lesions to both feet states she used to take care of them herself by shaving them down.  Objective:  Physical Exam: warm, good capillary refill, no trophic changes or ulcerative lesions, normal DP and PT pulses, and normal sensory exam. Left Foot: punctate kerotosis forefoot and midfoot laterally  Right Foot: punctate keratosis right heel   Assessment:   1. Porokeratosis   2. Sciatica associated with disorder of lumbar spine    Plan:  Patient was evaluated and treated and all questions answered.  Porokeratoses -Educated on etiology -Debrided and destroyed lesions  BLE numbness -Likely related to LBP/sciatica. Would recommend lumbar XR and eval for worsening stenosis  Return if symptoms worsen or fail to improve.

## 2021-03-15 ENCOUNTER — Encounter: Payer: Self-pay | Admitting: *Deleted

## 2021-03-15 ENCOUNTER — Encounter: Payer: Self-pay | Admitting: Cardiology

## 2021-03-28 ENCOUNTER — Other Ambulatory Visit: Payer: Self-pay | Admitting: Oncology

## 2021-03-28 DIAGNOSIS — C7A012 Malignant carcinoid tumor of the ileum: Secondary | ICD-10-CM

## 2021-04-07 NOTE — Progress Notes (Signed)
Cardiology Office Note:    Date:  04/08/2021   ID:  Kristi Romero, DOB Feb 22, 1956, MRN [id number]  PCP:  Barnetta Chapel, NP  Cardiologist:  Shirlee More, MD   Referring MD: Barnetta Chapel, NP  ASSESSMENT:    1. Nonspecific abnormal electrocardiogram (ECG) (EKG)   2. Primary hypertension   3. 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 Mixed hyperlipidemia    PLAN:    In order of problems listed above:  Her EKG is a variant of normal clinically no indication she sustained an acute cardiac event her echocardiogram shows normal cardiac function and at this time I do no further evaluation I do not think there is an indication for ischemia evaluation Stable Followed by oncology Currently not on lipid-lowering treatment  Next appointment as needed   Medication Adjustments/Labs and Tests Ordered: Current medicines are reviewed at length with the patient today.  Concerns regarding medicines are outlined above.  No orders of the defined types were placed in this encounter.  No orders of the defined types were placed in this encounter.    Chief Complaint  Patient presents with   Abnormal ECG  I was told of having heart attack  History of Present Illness:    Kristi Romero is a 65 y.o. female with a history of neuroendocrine carcinoid tumor being treated with octreotide who is being seen today for the evaluation of an abnormal EKG at the request of Barnetta Chapel, NP.  She had an echocardiogram [facility] 03/05/2021 showing normal left ventricular size and function and wall thickness grade 1 diastolic dysfunction both left and right atrium normal in size and no other abnormality.  She was seen at [facility] 03/25/2021 with complaints of paresthesia. Laboratory studies showed a sodium 135 potassium 4.5 creatinine 0.74 she had high-sensitivity troponin assessed was normal white count was 8190 hemoglobin 13.7 platelets 328,000 chest x-ray showed no acute  cardiopulmonary disease she is felt appropriate for discharge and outpatient follow-up.  Her EKG 02/12/2021 personally reviewed baseline artifact is seen sinus rhythm 88 bpm poor R wave progression I disagree with the interpretation of possible anterior MI.  Her EKG 03/25/2021 showed sinus rhythm possible left atrial enlargement.  EKG 11/02/2020 at [facility] showed sinus bradycardia and was otherwise normal.  Her caregiver is present participates in the evaluation. She has no known history of heart disease congenital rheumatic atrial fibrillation or heart murmur. Following her EKG she underwent an echocardiogram that was normal. Is not having palpitation edema shortness of breath and has not had syncope. Her predominant complaint is that she is having bowel dysfunction and difficulty feeding that she feels full.  No nausea or vomiting and she is being seen by Dr. Melina Copa GI. Past Medical History:  Diagnosis Date   Abdominal pain, epigastric    Accident occurring in home 05/07/2016   Adrenal adenoma 03/16/2018   Anxiety and depression 03/16/2018   Cervicogenic headache 10/14/2016   Chronic back pain    Dizziness 10/14/2016   Elevated lipase 01/13/2017   Formatting of this note might be different from the original. 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 Dr Romero Favor [place]    COLONOSCOPY  09/05/2020   Dr Orlena Sheldon Benign neoplasm of transverse colon. Benign neoplasm of descending colon.   ESOPHAGOGASTRODUODENOSCOPY  02/2018   ESOPHAGOGASTRODUODENOSCOPY (EGD) WITH PROPOFOL N/A 03/16/2018   Procedure: ESOPHAGOGASTRODUODENOSCOPY (EGD) WITH PROPOFOL;  Surgeon: Ladene Artist, MD;  Location: [facility];  Service: Endoscopy;  Laterality: N/A;   NECK SURGERY     PARTIAL HYSTERECTOMY     TOE SURGERY Bilateral     Current Medications: Current Meds  Medication Sig   dicyclomine (BENTYL) 20 MG tablet Take 20 mg by mouth 2 (two) times daily.   linaclotide (LINZESS) 290 MCG CAPS capsule Take 290 mcg by mouth daily as needed (abdominal pain).     Allergies:   Codeine, Hydrocodone, and Lisinopril   Social History   Socioeconomic History   Marital status: Divorced    Spouse name: Not on file   Number of children: 3   Years of education: 9   Highest education level: Not on file  Occupational History   Occupation: Disabled  Tobacco Use   Smoking status: Former   Smokeless tobacco: Never   Tobacco comments:    quit long time ago   Scientific laboratory technician Use: Never used  Substance and Sexual Activity   Alcohol use: No    Alcohol/week: 0.0 standard drinks   Drug use: Yes    Types: Marijuana    Comment: Currently smokes about 7 marijuana "joints" per week.   Sexual activity: Not Currently  Other Topics Concern   Not on file  Social History Narrative   Lives at home alone.   Right-handed.   1 cup caffeine per day.   Social Determinants of Health   Financial Resource Strain: Not on file  Food Insecurity: Not on file  Transportation Needs: Not on file  Physical Activity: Not on file  Stress: Not on file   Social Connections: Not on file     Family History: The patient's family history includes Cancer in her mother; Heart disease in her father and mother; Hypertension in her father and mother; Lung cancer in her father.  ROS:   ROS Please see the history of present illness.     All other systems reviewed and are negative.  EKGs/Labs/Other Studies Reviewed:    The following studies were reviewed today:   EKG:  EKG is  ordered today.  The ekg ordered today is personally reviewed and demonstrates sinus rhythm when I would interpret is poor R wave progression, low rate computer consider anterior MI I disagree with  Recent Labs: 03/11/2021: ALT 20; BUN 18; Creatinine 0.7; Hemoglobin 13.1; Platelets 248; Potassium 3.5; Sodium 141  Recent Lipid Panel No results found for: CHOL, TRIG, HDL, CHOLHDL, VLDL, LDLCALC, LDLDIRECT  Physical Exam:    VS:  BP 111/73   Pulse 71   Ht 5\' 4"  (1.626 m)   Wt 138 lb 6.4 oz (62.8 kg)  LMP  (LMP Unknown)   SpO2 97%   BMI 23.76 kg/m     Wt Readings from Last 3 Encounters:  04/08/21 138 lb 6.4 oz (62.8 kg)  03/15/21 131 lb (59.4 kg)  03/11/21 139 lb (63 kg)     GEN:  Well nourished, well developed in no acute distress HEENT: Normal NECK: No JVD; No carotid bruits LYMPHATICS: No lymphadenopathy CARDIAC: RRR, no murmurs, rubs, gallops RESPIRATORY:  Clear to auscultation without rales, wheezing or rhonchi  ABDOMEN: Soft, non-tender, non-distended MUSCULOSKELETAL:  No edema; No deformity  SKIN: Warm and dry NEUROLOGIC:  Alert and oriented x 3 PSYCHIATRIC:  Normal affect     Signed, Shirlee More, MD  04/08/2021 9:47 AM    [place]

## 2021-04-08 ENCOUNTER — Encounter: Payer: Self-pay | Admitting: Cardiology

## 2021-04-08 ENCOUNTER — Encounter: Payer: Self-pay | Admitting: Hematology and Oncology

## 2021-04-08 ENCOUNTER — Inpatient Hospital Stay: Payer: Medicare HMO

## 2021-04-08 ENCOUNTER — Inpatient Hospital Stay: Payer: Medicare HMO | Attending: Oncology | Admitting: Hematology and Oncology

## 2021-04-08 ENCOUNTER — Other Ambulatory Visit: Payer: Self-pay

## 2021-04-08 ENCOUNTER — Ambulatory Visit (INDEPENDENT_AMBULATORY_CARE_PROVIDER_SITE_OTHER): Payer: Medicare HMO | Admitting: Cardiology

## 2021-04-08 VITALS — BP 109/68 | HR 64 | Temp 98.0°F | Resp 18 | Ht 64.0 in | Wt 139.2 lb

## 2021-04-08 VITALS — BP 111/73 | HR 71 | Ht 64.0 in | Wt 138.4 lb

## 2021-04-08 DIAGNOSIS — Z79899 Other long term (current) drug therapy: Secondary | ICD-10-CM | POA: Insufficient documentation

## 2021-04-08 DIAGNOSIS — E782 Mixed hyperlipidemia: Secondary | ICD-10-CM | POA: Diagnosis not present

## 2021-04-08 DIAGNOSIS — I10 Essential (primary) hypertension: Secondary | ICD-10-CM | POA: Diagnosis not present

## 2021-04-08 DIAGNOSIS — C7A012 Malignant carcinoid tumor of the ileum: Secondary | ICD-10-CM | POA: Diagnosis present

## 2021-04-08 DIAGNOSIS — K5909 Other constipation: Secondary | ICD-10-CM

## 2021-04-08 DIAGNOSIS — K5900 Constipation, unspecified: Secondary | ICD-10-CM

## 2021-04-08 DIAGNOSIS — R9431 Abnormal electrocardiogram [ECG] [EKG]: Secondary | ICD-10-CM

## 2021-04-08 HISTORY — DX: Constipation, unspecified: K59.00

## 2021-04-08 LAB — HEPATIC FUNCTION PANEL
ALT: 42 — AB (ref 7–35)
AST: 72 — AB (ref 13–35)
Alkaline Phosphatase: 73 (ref 25–125)
Bilirubin, Total: 0.6

## 2021-04-08 LAB — CBC: RBC: 4.13 (ref 3.87–5.11)

## 2021-04-08 LAB — BASIC METABOLIC PANEL
BUN: 21 (ref 4–21)
CO2: 27 — AB (ref 13–22)
Chloride: 102 (ref 99–108)
Creatinine: 0.6 (ref 0.5–1.1)
Glucose: 111
Potassium: 4.6 (ref 3.4–5.3)
Sodium: 135 — AB (ref 137–147)

## 2021-04-08 LAB — CBC AND DIFFERENTIAL
HCT: 40 (ref 36–46)
Hemoglobin: 13.5 (ref 12.0–16.0)
Platelets: 204 (ref 150–399)
WBC: 9.2

## 2021-04-08 LAB — COMPREHENSIVE METABOLIC PANEL
Albumin: 4.2 (ref 3.5–5.0)
Calcium: 9.5 (ref 8.7–10.7)

## 2021-04-08 MED ORDER — OCTREOTIDE ACETATE 20 MG IM KIT
20.0000 mg | PACK | Freq: Once | INTRAMUSCULAR | Status: AC
  Administered 2021-04-08: 20 mg via INTRAMUSCULAR
  Filled 2021-04-08: qty 1

## 2021-04-08 NOTE — Patient Instructions (Signed)
Octreotide acetate injection suspension What is this medication? OCTREOTIDE (ok TREE oh tide) is used to reduce blood levels of growth hormone in patients with a condition called acromegaly. This medicine also reduces flushing and watery diarrhea caused by certain types of cancer. This medicine may be used for other purposes; ask your health care provider or pharmacist if you have questions. COMMON BRAND NAME(S): Sandostatin LAR What should I tell my care team before I take this medication? They need to know if you have any of these conditions: diabetes gallbladder disease kidney disease liver disease thyroid disease an unusual or allergic reaction to octreotide, other medicines, foods, dyes, or preservatives pregnant or trying to get pregnant breast-feeding How should I use this medication? This medicine is for injection into a muscle. It is usually given by a health care professional in a hospital or clinic setting. Talk to your pediatrician regarding the use of this medicine in children. Special care may be needed. Overdosage: If you think you have taken too much of this medicine contact a poison control center or emergency room at once. NOTE: This medicine is only for you. Do not share this medicine with others. What if I miss a dose? Keep appointments for follow-up doses. It is important not to miss your dose. Call your doctor or health care professional if you are unable to keep an appointment. What may interact with this medication? Do not take this medicine with any of the following medications: cisapride dronedarone flibanserin lutetium Lu 177 dotatate pimozide saquinavir thioridazine This medicine may also interact with the following medications: bromocriptine certain medicines for blood pressure, heart disease, irregular heartbeat cyclosporine diuretics medicines for diabetes, including insulin quinidine This list may not describe all possible interactions. Give your  health care provider a list of all the medicines, herbs, non-prescription drugs, or dietary supplements you use. Also tell them if you smoke, drink alcohol, or use illegal drugs. Some items may interact with your medicine. What should I watch for while using this medication? Visit your health care professional for regular checks on your progress. Tell your health care professional if your symptoms do not start to get better or if they get worse. This medicine may cause decreases in blood sugar. Signs of low blood sugar include chills, cool, pale skin or cold sweats, drowsiness, extreme hunger, fast heartbeat, headache, nausea, nervousness or anxiety, shakiness, trembling, unsteadiness, tiredness, or weakness. Contact your doctor or health care professional right away if you experience any of these symptoms. This medicine may increase blood sugar. Ask your healthcare provider if changes in diet or medicines are needed if you have diabetes. This medicine may cause a decrease in vitamin B12. You should make sure that you get enough vitamin B12 while you are taking this medicine. Discuss the foods you eat and the vitamins you take with your health care professional. What side effects may I notice from receiving this medication? Side effects that you should report to your doctor or health care professional as soon as possible: allergic reactions like skin rash, itching or hives, swelling of the face, lips, or tongue fast, slow, or irregular heartbeat right upper belly pain severe stomach pain signs and symptoms of high blood sugar such as being more thirsty or hungry or having to urinate more than normal. You may also feel very tired or have blurry vision. signs and symptoms of low blood sugar such as feeling anxious; confusion; dizziness; increased hunger; unusually weak or tired; increased sweating; shakiness; cold, clammy skin;   irritable; headache; blurred vision; fast heartbeat; loss of  consciousness unusually weak or tired Side effects that usually do not require medical attention (report these to your doctor or health care professional if they continue or are bothersome): diarrhea dizziness gas headache nausea, vomiting pain, redness, or irritation at site where injected upset stomach This list may not describe all possible side effects. Call your doctor for medical advice about side effects. You may report side effects to FDA at 1-800-FDA-1088. Where should I keep my medication? This medicine is given in a hospital or clinic and will not be stored at home. NOTE: This sheet is a summary. It may not cover all possible information. If you have questions about this medicine, talk to your doctor, pharmacist, or health care provider.  2022 Elsevier/Gold Standard (2019-08-02 00:00:00)  

## 2021-04-08 NOTE — Progress Notes (Addendum)
Kristi Romero  [address] [place],  [place]  [address] [phone]  Clinic Day:  04/08/21  Referring physician: Barnetta Chapel, NP  ASSESSMENT & PLAN:   Assessment & Plan: Malignant carcinoid tumor of ileum [facility]i [facility]) We began seeing her in [facility] for elevation of the Chromogranin A with no obvious findings of recurrent disease on endoscopy, colonoscopy or CT imaging. Kristi Romero was placed on monthly octreotide and the Chromogranin A returned to normal range rapidly.  Repeat CT in August did not reveal any evidence of disease.  Kristi Romero continues to report nausea, especially after eating solid foods. Kristi Romero uses ondansetron as needed. Kristi Romero is mainly surviving on nutritional supplements due to this.  Kristi Romero also reports intermittent upper abdominal pain.  We do not feel this is related to carcinoid.  Kristi Romero is not scheduled to see Dr. Melina Romero until January, so I will call him for his recommendations.  Kristi Romero will proceed with octreotide today.  We will plan to see her back in 4 weeks with a CBC and comprehensive metabolic panel prior to her next octreotide.  Constipation Kristi Romero describes small, hard stool, but is not taking anything regularly for constipation.  I advised her that this can contribute to her abdominal pain.  I recommended Kristi Romero take Kristi Romero daily and if Kristi Romero does not wish to do this, Kristi Romero could try Kristi Romero daily.   The patient and her friend understands the plans discussed today and are in agreement with them.  They know to contact our office if Kristi Romero develops concerns prior to her next appointment.      Kristi Pickles, PA-C  [facility] [address] [place] [address] Dept: [phone] Dept Fax: [phone]   Orders Placed This Encounter  Procedures   CBC with Differential ([facility]ero)    Standing Status:   Standing    Number of Occurrences:   33    Standing Expiration Date:   04/08/2022    CMP (Kristi Romero)    Standing Status:   Standing    Number of Occurrences:   33    Standing Expiration Date:   04/08/2022       CHIEF COMPLAINT:  CC: Carcinoid syndrome  Current Treatment: Octreotide every 4 weeks   HISTORY OF PRESENT ILLNESS:  Kristi Romero is a 65 year old. female referred by Dr. Melina Romero for a transfer of care and further management of her history of neuroendocrine and carcinoid tumor, which was diagnosed in October 2016.  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  Kristi Romero underwent surgical resection with Dr. Zada Romero. Pathology confirmed well differentiated neuroendocrine tumor consistent with carcinoid tumor.  Seven of eighteen lymph nodes were involved (7/18).  Primary tumor measured 2.2 x 1.7 x 1.5 cm, with three apparent vascular metastases including a nodule up to 3 cm with metastatic tumor present within 1 mm of the inked radial margin.  In the distal appendix there was also a separate 3 mm carcinoid tumor.  This was a staged as a pT3pN1 with low mitotic rate (0 per 10 high powered fields).  Kristi Romero did require a PEG tube in 2017 for failure to thrive and this was removed in January 2018.  Her  weight was up to 157 by September 2021.   Her last follow up was in January 2020 with Dr. Verl Romero until we started seeing her in early February 2022.  Kristi Romero complained of weight loss, stomach pain and nausea.  Kristi Romero was evaluated by Dr. Melina Romero with endoscopy in November 2021 which revealed chronic gastritis and was negative for H.Pylori.  No evidence of malignancy was observed.  Kristi Romero also notes bilateral numbness "from her mouth to her feet".   Kristi Romero receives B12 injections every 6 months.  Kristi Romero has intermittent hot flashes, which we have  felt represent flushing from carcinoid syndrome.  Chromogranin A from February was elevated at 262.3. Kristi Romero also had an elevated serum protein. Serum protein electrophoresis did not reveal a monoclonal spike. CT imaging revealed adrenal adenomas but no obvious evidence of recurrent/progressive disease.  Kristi Romero was started on octreotide injection in March 2022 with good response based on her decreasing chromogranin A.  CT chest, abdomen and pelvis in August revealed postoperative changes of right hemicolectomy with enterocolonic anastomosis without evidence of local recurrence or abdominopelvic metastatic disease.  Stable left adrenal gland adenoma.  MRI scan of the brain in August revealed evidence of chronic ischemia. Kristi Romero had normalization of the Chromogranin A in April. Chromogranin A from August was 48.8 down from 61.4 in April. The chromogranin A was 51 in September.  Kristi Romero continues octreotide every 4 weeks.   INTERVAL HISTORY:  Kristi Romero is here today for repeat clinical assessment prior to her next octreotide. Kristi Romero continues to report intermittent upper abdominal pain for which we gave her hydrocodone/APAP. Kristi Romero states this has been causing her nausea lately, so Kristi Romero is not using it.  Kristi Romero continues to report nausea whenever Kristi Romero eats solid food despite ondansetron. Kristi Romero reports hard stools, but does not take anything regularly for constipation.  Kristi Romero is using nutritional supplements 4 to 5 times daily. Kristi Romero denies fevers or chills.  Kristi Romero is not scheduled to see Dr. Melina Romero until January.  Kristi Romero reports chronic arthralgias and paresthesias of her hands, feet and legs. Kristi Romero has seen Dr. Shela Romero in neurology, but is not taking gabapentin or duloxetine due to side effects.  They are trying to get another appointment with him sooner, which I recommended.  Kristi Romero denies fevers or chills. Kristi Romero denies pain. Her appetite is fairly good and Kristi Romero would like to be able to eat. Her weight has increased 7 pounds over last month .  REVIEW OF  SYSTEMS:  Review of Systems  Constitutional:  Positive for fatigue. Negative for appetite change, chills, fever and unexpected weight change.  HENT:   Negative for lump/mass, mouth sores and sore throat.   Respiratory:  Negative for cough and shortness of breath.   Cardiovascular:  Negative for chest pain and leg swelling.  Gastrointestinal:  Positive for abdominal pain, constipation and nausea. Negative for diarrhea and vomiting.  Endocrine: Negative for hot flashes.  Genitourinary:  Negative for difficulty urinating, dysuria, frequency and hematuria.   Musculoskeletal:  Negative for arthralgias, back pain and myalgias.  Skin:  Negative for rash.  Neurological:  Negative for dizziness and headaches.  Hematological:  Negative for adenopathy. Does not bruise/bleed easily.  Psychiatric/Behavioral:  Negative for depression and sleep disturbance. The patient is not nervous/anxious.     VITALS:  Blood pressure 116/65, pulse 63, temperature 99 F (37.2 C), temperature source Oral, resp. rate 18, height 5\' 4"  (1.626 m), weight 139 lb (63 kg), SpO2 99 %.  Wt Readings from Last 3 Encounters:  04/08/21 139 lb 4 oz (63.2 kg)  04/08/21 139 lb (63 kg)  04/08/21 138 lb 6.4 oz (62.8 kg)    Body mass index is 23.86 kg/m.  Performance status (ECOG): 2 - Symptomatic, <50% confined to bed  PHYSICAL EXAM:  Physical Exam Vitals and nursing note reviewed.  Constitutional:      General: Kristi Romero is not in acute distress.    Appearance: Normal appearance.  HENT:     Head: Normocephalic and atraumatic.     Mouth/Throat:     Mouth: Mucous membranes are moist.     Pharynx: Oropharynx is clear. No oropharyngeal exudate or posterior oropharyngeal erythema.  Eyes:     General: No scleral icterus.    Extraocular Movements: Extraocular movements intact.     Conjunctiva/sclera: Conjunctivae normal.     Pupils: Pupils are equal, round, and reactive to light.  Cardiovascular:     Rate and Rhythm: Normal rate  and regular rhythm.     Heart sounds: Normal heart sounds. No murmur heard.   No friction rub. No gallop.  Pulmonary:     Effort: Pulmonary effort is normal.     Breath sounds: Normal breath sounds. No wheezing, rhonchi or rales.  Abdominal:     General: There is no distension.     Palpations: Abdomen is soft. There is no hepatomegaly, splenomegaly or mass.     Tenderness: There is abdominal tenderness in the epigastric area.  Musculoskeletal:        General: Normal range of motion.     Cervical back: Normal range of motion and neck supple. No tenderness.     Right lower leg: No edema.     Left lower leg: No edema.  Lymphadenopathy:     Cervical: No cervical adenopathy.     Upper Body:     Right upper body: No supraclavicular or axillary adenopathy.     Left upper body: No supraclavicular or axillary adenopathy.     Lower Body: No right inguinal adenopathy. No left inguinal adenopathy.  Skin:    General: Skin is warm and dry.     Coloration: Skin is not jaundiced.     Findings: No rash.  Neurological:     Mental Status: Kristi Romero is alert and oriented to person, place, and time.     Cranial Nerves: No cranial nerve deficit.  Psychiatric:        Mood and Affect: Mood normal.        Behavior: Behavior normal.        Thought Content: Thought content normal.    LABS:   CBC Latest Ref Rng & Units 04/08/2021 03/11/2021 02/11/2021  WBC - 9.2 9.0 8.7  Hemoglobin 12.0 - 16.0 13.5 13.1 13.3  Hematocrit 36 - 46 40 39 39  Platelets 150 - 399 204 248 221   CMP Latest Ref Rng & Units 04/08/2021 03/11/2021 02/11/2021  Glucose 70 - 99 mg/dL - - -  BUN 4 - 21 21 18  -  Creatinine 0.5 - 1.1 0.6 0.7 0.8  Sodium 137 - 147 135(A) 141 141  Potassium 3.4 - 5.3 4.6 3.5 4.0  Chloride 99 - 108 102 101 103  CO2 13 - 22 27(A) 34(A) 33(A)  Calcium 8.7 - 10.7 9.5 9.9 9.7  Total Protein 6.3 - 8.2 g/dL - 8.6(A) -  Total Bilirubin 0.3 - 1.2 mg/dL - - -  Alkaline Phos 25 - 125 73 71 91  AST 13 - 35  72(A) 27 24  ALT  7 - 35 42(A) 20 14     No results found for: CEA1 / No results found for: CEA1 No results found for: PSA1 No results found for: DGL875 No results found for: IEP329  Lab Results  Component Value Date   TOTALPROTELP 6.6 06/01/2020   ALBUMINELP 3.1 06/01/2020   A1GS 0.3 06/01/2020   A2GS 0.8 06/01/2020   BETS 1.1 06/01/2020   GAMS 1.4 06/01/2020   MSPIKE Not Observed 06/01/2020   SPEI Comment 06/01/2020   No results found for: TIBC, FERRITIN, IRONPCTSAT No results found for: LDH  STUDIES:  No results found.    HISTORY:   Past Medical History:  Diagnosis Date   Abdominal pain, epigastric    Accident occurring in home 05/07/2016   Adrenal adenoma 03/16/2018   Anxiety and depression 03/16/2018   Cervicogenic headache 10/14/2016   Chronic back pain    Constipation 04/08/2021   Dizziness 10/14/2016   Elevated lipase 01/13/2017   Formatting of this note might be different from the original. 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 Dr Harl Favor [place]    COLONOSCOPY  09/05/2020   Dr Orlena Sheldon Benign neoplasm of transverse  colon. Benign neoplasm of descending colon.   ESOPHAGOGASTRODUODENOSCOPY  02/2018   ESOPHAGOGASTRODUODENOSCOPY (EGD) WITH PROPOFOL N/A 03/16/2018   Procedure: ESOPHAGOGASTRODUODENOSCOPY (EGD) WITH PROPOFOL;  Surgeon: Ladene Artist, MD;  Location: [facility];  Service: Endoscopy;  Laterality: N/A;   NECK SURGERY     PARTIAL HYSTERECTOMY     TOE SURGERY Bilateral     Family History  Problem Relation Age of Onset   Lung cancer Father    Heart disease Father    Hypertension Father    Heart disease Mother    Hypertension Mother    Cancer Mother     Social History:  reports that Kristi Romero has quit smoking. Kristi Romero has never used smokeless tobacco. Kristi Romero reports current drug use. Drug: Marijuana. Kristi Romero reports that Kristi Romero does not drink alcohol.The patient is accompanied by her friend today.  Allergies:  Allergies  Allergen Reactions   Codeine Nausea Romero   Hydrocodone    Lisinopril Swelling    Current Medications: Current Outpatient Medications  Medication Sig Dispense Refill   atorvastatin (LIPITOR) 80 MG tablet Take 80 mg by mouth daily. (Patient not taking: Reported on 04/08/2021)     dicyclomine (BENTYL) 20 MG tablet Take 20 mg by mouth 2 (two) times daily.     divalproex (DEPAKOTE) 250 MG DR tablet Take 250 mg by mouth 2 (two) times daily. (Patient not taking: Reported on 04/08/2021)     DULoxetine (CYMBALTA) 60  MG capsule Take 60 mg by mouth daily. (Patient not taking: Reported on 04/08/2021)     famotidine (PEPCID) 20 MG tablet Take 40 mg by mouth at bedtime. (Patient not taking: Reported on 04/08/2021)     HYDROcodone-acetaminophen (NORCO/VICODIN) 5-325 MG tablet Take 1 tablet by mouth every 4 (four) hours as needed for moderate pain. (Patient not taking: Reported on 04/08/2021) 60 tablet 0   linaclotide (Kristi Romero) 290 MCG CAPS capsule Take 290 mcg by mouth daily as needed (abdominal pain).     mirtazapine (REMERON) 15 MG tablet TAKE 1 TABLET AT BEDTIME (Patient not taking: Reported on  04/08/2021) 90 tablet 3   ondansetron (ZOFRAN ODT) 4 MG disintegrating tablet Take 1 tablet (4 mg total) by mouth every 8 (eight) hours as needed for nausea or vomiting. (Patient not taking: Reported on 04/08/2021) 40 tablet 0   pantoprazole (PROTONIX) 40 MG tablet Take 40 mg by mouth daily. (Patient not taking: Reported on 04/08/2021)     rosuvastatin (CRESTOR) 10 MG tablet Take 10 mg by mouth daily. (Patient not taking: Reported on 04/08/2021)     No current facility-administered medications for this visit.

## 2021-04-08 NOTE — Assessment & Plan Note (Addendum)
She describes small, hard stool, but is not taking anything regularly for constipation.  I advised her that this can contribute to her abdominal pain.  I recommended she take Linzess daily and if she does not wish to do this, she could try MiraLAX daily.

## 2021-04-08 NOTE — Patient Instructions (Signed)

## 2021-04-08 NOTE — Assessment & Plan Note (Addendum)
We began seeing her in [place] for elevation of the Chromogranin A with no obvious findings of recurrent disease on endoscopy, colonoscopy or CT imaging. She was placed on monthly octreotide and the Chromogranin A returned to normal range rapidly.  Repeat CT in August did not reveal any evidence of disease.  She continues to report nausea, especially after eating solid foods. She uses ondansetron as needed. She is mainly surviving on nutritional supplements due to this.  She also reports intermittent upper abdominal pain.  We do not feel this is related to carcinoid.  She is not scheduled to see Dr. Melina Copa until January, so I will call him for his recommendations.  She will proceed with octreotide today.  We will plan to see her back in 4 weeks with a CBC and comprehensive metabolic panel prior to her next octreotide.

## 2021-04-11 ENCOUNTER — Encounter: Payer: Self-pay | Admitting: Hematology and Oncology

## 2021-04-11 ENCOUNTER — Encounter: Payer: Self-pay | Admitting: Oncology

## 2021-04-12 ENCOUNTER — Telehealth: Payer: Self-pay

## 2021-04-12 LAB — CHROMOGRANIN A

## 2021-04-12 NOTE — Telephone Encounter (Signed)
-----   Message from [name], PA-C sent at 04/12/2021 11:59 AM EST ----- Please let her know her carcinoid test is still normal. Thanks!

## 2021-04-12 NOTE — Telephone Encounter (Signed)
Patient notified

## 2021-05-02 ENCOUNTER — Encounter: Payer: Self-pay | Admitting: Oncology

## 2021-05-03 ENCOUNTER — Encounter: Payer: Self-pay | Admitting: Oncology

## 2021-05-06 ENCOUNTER — Other Ambulatory Visit: Payer: Self-pay

## 2021-05-06 ENCOUNTER — Inpatient Hospital Stay: Payer: Medicare HMO | Attending: Oncology | Admitting: Hematology and Oncology

## 2021-05-06 ENCOUNTER — Encounter: Payer: Self-pay | Admitting: Hematology and Oncology

## 2021-05-06 ENCOUNTER — Inpatient Hospital Stay: Payer: Medicare HMO

## 2021-05-06 VITALS — BP 108/64 | HR 63 | Temp 98.5°F | Resp 18

## 2021-05-06 DIAGNOSIS — C7A012 Malignant carcinoid tumor of the ileum: Secondary | ICD-10-CM

## 2021-05-06 DIAGNOSIS — C7B01 Secondary carcinoid tumors of distant lymph nodes: Secondary | ICD-10-CM | POA: Diagnosis not present

## 2021-05-06 DIAGNOSIS — Z885 Allergy status to narcotic agent status: Secondary | ICD-10-CM | POA: Diagnosis not present

## 2021-05-06 DIAGNOSIS — Z79899 Other long term (current) drug therapy: Secondary | ICD-10-CM | POA: Diagnosis not present

## 2021-05-06 DIAGNOSIS — Z888 Allergy status to other drugs, medicaments and biological substances status: Secondary | ICD-10-CM | POA: Insufficient documentation

## 2021-05-06 LAB — BASIC METABOLIC PANEL WITH GFR
BUN: 20 (ref 4–21)
CO2: 30 — AB (ref 13–22)
Chloride: 104 (ref 99–108)
Creatinine: 0.6 (ref 0.5–1.1)
Glucose: 111
Potassium: 4.1 (ref 3.4–5.3)
Sodium: 137 (ref 137–147)

## 2021-05-06 LAB — CBC AND DIFFERENTIAL
HCT: 40 (ref 36–46)
Hemoglobin: 13.3 (ref 12.0–16.0)
Neutrophils Absolute: 3.04
Platelets: 244 (ref 150–399)
WBC: 7.8

## 2021-05-06 LAB — COMPREHENSIVE METABOLIC PANEL WITH GFR
Albumin: 4.1 (ref 3.5–5.0)
Calcium: 9.3 (ref 8.7–10.7)

## 2021-05-06 LAB — HEPATIC FUNCTION PANEL
ALT: 15 (ref 7–35)
AST: 24 (ref 13–35)
Alkaline Phosphatase: 64 (ref 25–125)
Bilirubin, Total: 0.4

## 2021-05-06 LAB — CBC: RBC: 4.06 (ref 3.87–5.11)

## 2021-05-06 MED ORDER — OCTREOTIDE ACETATE 20 MG IM KIT
20.0000 mg | PACK | Freq: Once | INTRAMUSCULAR | Status: AC
  Administered 2021-05-06: 20 mg via INTRAMUSCULAR
  Filled 2021-05-06: qty 1

## 2021-05-06 NOTE — Progress Notes (Signed)
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  Seven of eighteen lymph nodes were involved (7/18).  Primary tumor measured 2.2 x 1.7 x 1.5 cm, with three apparent vascular metastases including a nodule up to 3 cm with metastatic tumor present within 1 mm of the inked radial margin.  In the distal appendix there was also a separate 3 mm carcinoid tumor.  This was a staged as a pT3pN1 with low mitotic rate (0 per 10 high powered fields).  [name] was sporadic. She continues with monthly octreotide. She is scheduled to see her GI doctor at the end of this month and will request him to put her feeding tube back in as she is unable to eat. Her weight remains stable today. She will return to clinic in 4 weeks for repeat evaluation.    The patient understands the plans discussed today and is in agreement with them.  She knows to contact our office if she develops concerns prior to her next appointment.   [name], NP  [place] [address] [place] [address] Dept: [phone] Dept Fax: [phone]   Orders Placed This Encounter  Procedures   CBC and differential    This external order was created through the Results Console.   CBC    This external order was created through the Results Console.   Basic metabolic panel    This external order was created through the Results Console.   Comprehensive metabolic panel    This external order was created through the Results Console.   Hepatic function panel    This external order was created through the Results Console.      CHIEF COMPLAINT:  CC: A 66-year  old female with history of malignant carcinoid tumor of ileum here for 4 week evaluation  Current Treatment:  Octreotide  INTERVAL HISTORY:  Kristi Romero is here today for repeat clinical assessment. She denies fevers or chills. She denies pain. Her appetite is good. Her weight has been stable.  I have reviewed the past medical history, past surgical history, social history and family history with the patient and they are unchanged from previous note.  ALLERGIES:  is allergic to codeine, hydrocodone, and lisinopril.  MEDICATIONS:  Current Outpatient Medications  Medication Sig Dispense Refill   DULoxetine (CYMBALTA) 60 MG capsule Take 60 mg by mouth daily.     HYDROcodone-acetaminophen (NORCO/VICODIN) 5-325 MG tablet Take 1 tablet by mouth every 4 (four) hours as needed for moderate pain. 60 tablet 0   meclizine (ANTIVERT) 25 MG tablet Take by mouth.     metoprolol tartrate (LOPRESSOR) 25 MG tablet      mirtazapine (REMERON) 15 MG tablet TAKE 1 TABLET AT BEDTIME 90 tablet 3   ondansetron (ZOFRAN) 4 MG tablet      atorvastatin (LIPITOR) 80 MG tablet Take 80 mg by mouth daily. (Patient not taking: Reported on 04/08/2021)     baclofen (LIORESAL) 10 MG tablet Take 10 mg by mouth 2 (two) times daily.     dicyclomine (BENTYL) 20 MG tablet Take 20 mg by mouth 2 (two) times daily.     divalproex (DEPAKOTE) 250 MG DR  tablet Take 250 mg by mouth 2 (two) times daily. (Patient not taking: Reported on 04/08/2021)     famotidine (PEPCID) 20 MG tablet Take 40 mg by mouth at bedtime. (Patient not taking: Reported on 04/08/2021)     gabapentin (NEURONTIN) 600 MG tablet Take 600 mg by mouth 3 (three) times daily.     linaclotide (LINZESS) 290 MCG CAPS capsule Take 290 mcg by mouth daily as needed (abdominal pain).     metoCLOPramide (REGLAN) 10 MG tablet Take by mouth.     pantoprazole (PROTONIX) 40 MG tablet Take 40 mg by mouth daily. (Patient not taking: Reported on 04/08/2021)     prochlorperazine  (COMPAZINE) 10 MG tablet Take by mouth.     rosuvastatin (CRESTOR) 10 MG tablet Take 10 mg by mouth daily. (Patient not taking: Reported on 04/08/2021)     sucralfate (CARAFATE) 1 g tablet Take 1 g by mouth 4 (four) times daily.     No current facility-administered medications for this visit.   Facility-Administered Medications Ordered in Other Visits  Medication Dose Route Frequency Provider Last Rate Last Admin   octreotide (SANDOSTATIN LAR) IM injection 20 mg  20 mg Intramuscular Once Derwood Kaplan, MD        HISTORY OF PRESENT ILLNESS:   Oncology History  Malignant carcinoid tumor of ileum ([place])  04/28/2014 Initial Diagnosis   Malignant carcinoid tumor of ileum ([place])   02/02/2015 Cancer Staging   Staging form: Neuroendocrine Tumor - Duodenum/Ampulla/Jejunum/Illeum, AJCC 7th Edition - Clinical stage from 02/02/2015: Stage IIIB (T3(3), N1, M0) - Signed by Derwood Kaplan, MD on 05/28/2020 Staged by: Managing physician Diagnostic confirmation: Positive histology Specimen type: Excision Histopathologic type: Carcinoid tumor, NOS Stage prefix: Initial diagnosis Tumor size (mm): 22 Multiple tumors: Yes Number of tumors: 3 Histologic grade (G): G1 Lymph-vascular invasion (LVI): LVI not present (absent)/not identified Residual tumor (R): R0 - None Stage used in treatment planning: Yes National guidelines used in treatment planning: Yes Type of national guideline used in treatment planning: NCCN Staging comments: Surgical resection        REVIEW OF SYSTEMS:   Constitutional: Denies fevers, chills or abnormal weight loss Eyes: Denies blurriness of vision Ears, nose, mouth, throat, and face: Denies mucositis or sore throat Respiratory: Denies cough, dyspnea or wheezes Cardiovascular: Denies palpitation, chest discomfort or lower extremity swelling Gastrointestinal:  Denies nausea, heartburn or change in bowel habits Skin: Denies abnormal skin rashes Lymphatics:  Denies new lymphadenopathy or easy bruising Neurological:Denies numbness, tingling or new weaknesses Behavioral/Psych: Mood is stable, no new changes  All other systems were reviewed with the patient and are negative.   VITALS:  Blood pressure 104/61, pulse 61, temperature 98.4 F (36.9 C), temperature source Oral, resp. rate 20, height 5\' 4"  (1.626 m), weight 142 lb 1.6 oz (64.5 kg).  Wt Readings from Last 3 Encounters:  05/06/21 142 lb 1.6 oz (64.5 kg)  04/08/21 139 lb 4 oz (63.2 kg)  04/08/21 139 lb (63 kg)    Body mass index is 24.39 kg/m.  Performance status (ECOG): 1 - Symptomatic but completely ambulatory  PHYSICAL EXAM:   GENERAL:alert, no distress and comfortable SKIN: skin color, texture, turgor are normal, no rashes or significant lesions EYES: normal, Conjunctiva are pink and non-injected, sclera clear OROPHARYNX:no exudate, no erythema and lips, buccal mucosa, and tongue normal  NECK: supple, thyroid normal size, non-tender, without nodularity LYMPH:  no palpable lymphadenopathy in the cervical, axillary or inguinal LUNGS: clear to auscultation and percussion with  normal breathing effort HEART: regular rate & rhythm and no murmurs and no lower extremity edema ABDOMEN:abdomen soft, non-tender and normal bowel sounds Musculoskeletal:no cyanosis of digits and no clubbing  NEURO: alert & oriented x 3 with fluent speech, no focal motor/sensory deficits  LABORATORY DATA:  I have reviewed the data as listed    Component Value Date/Time   NA 137 05/06/2021 0000   K 4.1 05/06/2021 0000   CL 104 05/06/2021 0000   CO2 30 (A) 05/06/2021 0000   GLUCOSE 94 05/07/2020 1006   BUN 20 05/06/2021 0000   CREATININE 0.6 05/06/2021 0000   CREATININE 0.80 05/07/2020 1006   CALCIUM 9.3 05/06/2021 0000   PROT 8.6 (A) 03/11/2021 0000   ALBUMIN 4.1 05/06/2021 0000   AST 24 05/06/2021 0000   ALT 15 05/06/2021 0000   ALKPHOS 64 05/06/2021 0000   BILITOT 0.8 05/07/2020 1006    GFRNONAA >60 05/07/2020 1006   GFRAA >60 03/19/2018 1206    No results found for: SPEP, UPEP  Lab Results  Component Value Date   WBC 7.8 05/06/2021   NEUTROABS 3.04 05/06/2021   HGB 13.3 05/06/2021   HCT 40 05/06/2021   MCV 96 03/11/2021   PLT 244 05/06/2021      Chemistry      Component Value Date/Time   NA 137 05/06/2021 0000   K 4.1 05/06/2021 0000   CL 104 05/06/2021 0000   CO2 30 (A) 05/06/2021 0000   BUN 20 05/06/2021 0000   CREATININE 0.6 05/06/2021 0000   CREATININE 0.80 05/07/2020 1006   GLU 111 05/06/2021 0000      Component Value Date/Time   CALCIUM 9.3 05/06/2021 0000   ALKPHOS 64 05/06/2021 0000   AST 24 05/06/2021 0000   ALT 15 05/06/2021 0000   BILITOT 0.8 05/07/2020 1006       RADIOGRAPHIC STUDIES: I have personally reviewed the radiological images as listed and agreed with the findings in the report. No results found.

## 2021-05-06 NOTE — Patient Instructions (Signed)
Octreotide acetate injection suspension What is this medication? OCTREOTIDE (ok TREE oh tide) is used to reduce blood levels of growth hormone in patients with a condition called acromegaly. This medicine also reduces flushing and watery diarrhea caused by certain types of cancer. This medicine may be used for other purposes; ask your health care provider or pharmacist if you have questions. COMMON BRAND NAME(S): Sandostatin LAR What should I tell my care team before I take this medication? They need to know if you have any of these conditions: diabetes gallbladder disease kidney disease liver disease thyroid disease an unusual or allergic reaction to octreotide, other medicines, foods, dyes, or preservatives pregnant or trying to get pregnant breast-feeding How should I use this medication? This medicine is for injection into a muscle. It is usually given by a health care professional in a hospital or clinic setting. Talk to your pediatrician regarding the use of this medicine in children. Special care may be needed. Overdosage: If you think you have taken too much of this medicine contact a poison control center or emergency room at once. NOTE: This medicine is only for you. Do not share this medicine with others. What if I miss a dose? Keep appointments for follow-up doses. It is important not to miss your dose. Call your doctor or health care professional if you are unable to keep an appointment. What may interact with this medication? Do not take this medicine with any of the following medications: cisapride dronedarone flibanserin lutetium Lu 177 dotatate pimozide saquinavir thioridazine This medicine may also interact with the following medications: bromocriptine certain medicines for blood pressure, heart disease, irregular heartbeat cyclosporine diuretics medicines for diabetes, including insulin quinidine This list may not describe all possible interactions. Give your  health care provider a list of all the medicines, herbs, non-prescription drugs, or dietary supplements you use. Also tell them if you smoke, drink alcohol, or use illegal drugs. Some items may interact with your medicine. What should I watch for while using this medication? Visit your health care professional for regular checks on your progress. Tell your health care professional if your symptoms do not start to get better or if they get worse. This medicine may cause decreases in blood sugar. Signs of low blood sugar include chills, cool, pale skin or cold sweats, drowsiness, extreme hunger, fast heartbeat, headache, nausea, nervousness or anxiety, shakiness, trembling, unsteadiness, tiredness, or weakness. Contact your doctor or health care professional right away if you experience any of these symptoms. This medicine may increase blood sugar. Ask your healthcare provider if changes in diet or medicines are needed if you have diabetes. This medicine may cause a decrease in vitamin B12. You should make sure that you get enough vitamin B12 while you are taking this medicine. Discuss the foods you eat and the vitamins you take with your health care professional. What side effects may I notice from receiving this medication? Side effects that you should report to your doctor or health care professional as soon as possible: allergic reactions like skin rash, itching or hives, swelling of the face, lips, or tongue fast, slow, or irregular heartbeat right upper belly pain severe stomach pain signs and symptoms of high blood sugar such as being more thirsty or hungry or having to urinate more than normal. You may also feel very tired or have blurry vision. signs and symptoms of low blood sugar such as feeling anxious; confusion; dizziness; increased hunger; unusually weak or tired; increased sweating; shakiness; cold, clammy skin;   irritable; headache; blurred vision; fast heartbeat; loss of  consciousness unusually weak or tired Side effects that usually do not require medical attention (report these to your doctor or health care professional if they continue or are bothersome): diarrhea dizziness gas headache nausea, vomiting pain, redness, or irritation at site where injected upset stomach This list may not describe all possible side effects. Call your doctor for medical advice about side effects. You may report side effects to FDA at 1-800-FDA-1088. Where should I keep my medication? This medicine is given in a hospital or clinic and will not be stored at home. NOTE: This sheet is a summary. It may not cover all possible information. If you have questions about this medicine, talk to your doctor, pharmacist, or health care provider.  2022 Elsevier/Gold Standard (2019-08-02 00:00:00)  

## 2021-05-06 NOTE — Assessment & Plan Note (Signed)
History of neuroendocrine/carcinoid tumor of the ascending colon, diagnosed in October 2016, treated with surgical resection.  Seven of eighteen lymph nodes were involved (7/18).  Primary tumor measured 2.2 x 1.7 x 1.5 cm, with three apparent vascular metastases including a nodule up to 3 cm with metastatic tumor present within 1 mm of the inked radial margin.  In the distal appendix there was also a separate 3 mm carcinoid tumor.  This was a staged as a pT3pN1 with low mitotic rate (0 per 10 high powered fields).  [name] was sporadic. She continues with monthly octreotide. She is scheduled to see her GI doctor at the end of this month and will request him to put her feeding tube back in as she is unable to eat. Her weight remains stable today. She will return to clinic in 4 weeks for repeat evaluation.

## 2021-05-16 ENCOUNTER — Other Ambulatory Visit: Payer: Self-pay

## 2021-05-16 DIAGNOSIS — R1010 Upper abdominal pain, unspecified: Secondary | ICD-10-CM

## 2021-05-16 DIAGNOSIS — C7A012 Malignant carcinoid tumor of the ileum: Secondary | ICD-10-CM

## 2021-05-16 MED ORDER — HYDROCODONE-ACETAMINOPHEN 5-325 MG PO TABS: 1.0000 | ORAL_TABLET | ORAL | 0 refills | Status: DC | PRN

## 2021-05-23 ENCOUNTER — Emergency Department (HOSPITAL_COMMUNITY): Payer: Medicare HMO

## 2021-05-23 ENCOUNTER — Other Ambulatory Visit: Payer: Self-pay

## 2021-05-23 ENCOUNTER — Emergency Department (HOSPITAL_COMMUNITY)
Admission: EM | Admit: 2021-05-23 | Discharge: 2021-05-23 | Disposition: A | Discharge status: Home / Self Care | Payer: Medicare HMO | Attending: Emergency Medicine | Admitting: Emergency Medicine

## 2021-05-23 DIAGNOSIS — R110 Nausea: Secondary | ICD-10-CM | POA: Diagnosis present

## 2021-05-23 DIAGNOSIS — R531 Weakness: Secondary | ICD-10-CM | POA: Insufficient documentation

## 2021-05-23 DIAGNOSIS — Z79899 Other long term (current) drug therapy: Secondary | ICD-10-CM | POA: Diagnosis not present

## 2021-05-23 DIAGNOSIS — I10 Essential (primary) hypertension: Secondary | ICD-10-CM | POA: Insufficient documentation

## 2021-05-23 DIAGNOSIS — Z85060 Personal history of malignant carcinoid tumor of small intestine: Secondary | ICD-10-CM | POA: Insufficient documentation

## 2021-05-23 DIAGNOSIS — K5900 Constipation, unspecified: Secondary | ICD-10-CM | POA: Diagnosis not present

## 2021-05-23 DIAGNOSIS — R0602 Shortness of breath: Secondary | ICD-10-CM | POA: Insufficient documentation

## 2021-05-23 DIAGNOSIS — R630 Anorexia: Secondary | ICD-10-CM | POA: Diagnosis not present

## 2021-05-23 DIAGNOSIS — R109 Unspecified abdominal pain: Secondary | ICD-10-CM | POA: Insufficient documentation

## 2021-05-23 DIAGNOSIS — R202 Paresthesia of skin: Secondary | ICD-10-CM | POA: Insufficient documentation

## 2021-05-23 LAB — CBC WITH DIFFERENTIAL/PLATELET
Abs Immature Granulocytes: 0.03 10*3/uL (ref 0.00–0.07)
Basophils Absolute: 0 10*3/uL (ref 0.0–0.1)
Basophils Relative: 0 %
Eosinophils Absolute: 0 10*3/uL (ref 0.0–0.5)
Eosinophils Relative: 0 %
HCT: 42.7 % (ref 36.0–46.0)
Hemoglobin: 13.8 g/dL (ref 12.0–15.0)
Immature Granulocytes: 0 %
Lymphocytes Relative: 45 %
Lymphs Abs: 4 10*3/uL (ref 0.7–4.0)
MCH: 32 pg (ref 26.0–34.0)
MCHC: 32.3 g/dL (ref 30.0–36.0)
MCV: 99.1 fL (ref 80.0–100.0)
Monocytes Absolute: 0.7 10*3/uL (ref 0.1–1.0)
Monocytes Relative: 8 %
Neutro Abs: 4.2 10*3/uL (ref 1.7–7.7)
Neutrophils Relative %: 47 %
Platelets: 250 10*3/uL (ref 150–400)
RBC: 4.31 MIL/uL (ref 3.87–5.11)
RDW: 12.7 % (ref 11.5–15.5)
WBC: 9 10*3/uL (ref 4.0–10.5)
nRBC: 0 % (ref 0.0–0.2)

## 2021-05-23 LAB — HEPATIC FUNCTION PANEL
ALT: 20 U/L (ref 0–44)
AST: 21 U/L (ref 15–41)
Albumin: 3.8 g/dL (ref 3.5–5.0)
Alkaline Phosphatase: 70 U/L (ref 38–126)
Bilirubin, Direct: <0.1 mg/dL (ref 0.0–0.2)
Total Bilirubin: 0.3 mg/dL (ref 0.3–1.2)
Total Protein: 7.9 g/dL (ref 6.5–8.1)

## 2021-05-23 LAB — VITAMIN B12: Vitamin B-12: 591 pg/mL (ref 180–914)

## 2021-05-23 LAB — TSH: TSH: 1.463 u[IU]/mL (ref 0.350–4.500)

## 2021-05-23 LAB — FOLATE: Folate: 48.6 ng/mL (ref >5.9)

## 2021-05-23 LAB — BASIC METABOLIC PANEL
Anion gap: 6 (ref 5–15)
BUN: 20 mg/dL (ref 8–23)
CO2: 31 mmol/L (ref 22–32)
Calcium: 9.7 mg/dL (ref 8.9–10.3)
Chloride: 100 mmol/L (ref 98–111)
Creatinine, Ser: 0.86 mg/dL (ref 0.44–1.00)
GFR, Estimated: >60 mL/min (ref >60)
Glucose, Bld: 124 mg/dL — ABNORMAL HIGH (ref 70–99)
Potassium: 3.8 mmol/L (ref 3.5–5.1)
Sodium: 137 mmol/L (ref 135–145)

## 2021-05-23 LAB — LIPASE, BLOOD: Lipase: 66 U/L — ABNORMAL HIGH (ref 11–51)

## 2021-05-23 MED ORDER — LACTULOSE 10 GM/15ML PO SOLN: 10.0000 g | Freq: Every day | ORAL | 0 refills | Status: DC | PRN

## 2021-05-23 MED ORDER — LACTATED RINGERS IV BOLUS
1000.0000 mL | Freq: Once | INTRAVENOUS | Status: AC
Start: 2021-05-23 — End: 2021-05-23
  Administered 2021-05-23: 1000 mL via INTRAVENOUS

## 2021-05-23 MED ORDER — IOHEXOL 300 MG/ML  SOLN
100.0000 mL | Freq: Once | INTRAMUSCULAR | Status: AC | PRN
  Administered 2021-05-23: 100 mL via INTRAVENOUS

## 2021-05-23 NOTE — Discharge Instructions (Addendum)
The scan today shows that you are still very constipated which might be results of why you are feeling bloated and no appetite when you try to eat.  You had vitamin levels here that were done but have not resulted and your GI doctor Dr. Marin Olp can follow-up with those.  That might be a source of some of the numbness but if they are normal then you may need to see a neurologist in the future.  Start by seeing your regular doctor.

## 2021-05-23 NOTE — ED Provider Notes (Signed)
[facility] EMERGENCY DEPARTMENT Provider Note   CSN: [id number] Arrival date & time: 05/23/21  1016     History  Chief Complaint  Patient presents with   leg numb   Extremity Weakness   Anorexia   Shortness of Breath    Kristi Romero is a 66 y.o. female.  Patient is a 66 year old female with a history of malignant carcinoid tumor of the ileum status post resection but still on octreotide infusions, persistent abdominal pain, anorexia, hypercholesterolemia, hypertension who is presenting today with multiple complaints.  Patient reports these issues have been going on for months now but they seem to be gradually worsening.  She reports she is unable to eat because she feels like she has an appetite but then every time she starts eating she becomes very nauseated and just cannot seem to get the food down.  She denies any choking on food or feeling like it will not pass into her stomach.  She does get frequent abdominal pain as well which seems to be worse with eating.  She also reports feeling generally weak and having a numb tingling sensation in her arms and her legs bilaterally.  She reports that this makes it difficult to walk because the floor feels weird on her feet.  She has not had any falls.  She has been drinking Ensure and [facility] but denies drinking a lot of water.  She has been taking her medications but feels that they do not help and sometimes they make her feel a little worse so she does not take them at times.  Patient reports that she had an endoscopy about a year ago by a Dr. Melina Copa with GI but is not sure what they found and reported she is just been following up for the is infusions but she does not really know why she is getting them.  She denies any fever, cough but does note she feels short of breath with activity.  She denies any alcohol use and has had no recent medication changes.  She takes Linzess and has of regular bowel movement daily.  She has  not noticed any significant weight changes and saw her oncologist Dr. Marin Olp 2 weeks ago and at that time her weight was stable.  She does have an appointment with her GI doctor at the end of this month or early in February and she was could not discuss these issues with him however she just was feeling so bad she wanted evaluation.  The history is provided by the patient.  Extremity Weakness Associated symptoms include shortness of breath.  Shortness of Breath     Home Medications Prior to Admission medications   Medication Sig Start Date End Date Taking? Authorizing Provider  lactulose (CHRONULAC) 10 GM/15ML solution Take 15 mLs (10 g total) by mouth daily as needed for mild constipation or moderate constipation. Start off by taking 1 time a day but if that does not produce a good bowel movement then increase to 2 times a day 05/23/21  Yes Zelma Mazariego, Loree Fee, MD  atorvastatin (LIPITOR) 80 MG tablet Take 80 mg by mouth daily. Patient not taking: Reported on 04/08/2021    [provider]  baclofen (LIORESAL) 10 MG tablet Take 10 mg by mouth 2 (two) times daily. 04/11/21   [provider]  dicyclomine (BENTYL) 20 MG tablet Take 20 mg by mouth 2 (two) times daily.    [provider]  divalproex (DEPAKOTE) 250 MG DR tablet Take 250  mg by mouth 2 (two) times daily. Patient not taking: Reported on 04/08/2021    [provider]  DULoxetine (CYMBALTA) 60 MG capsule Take 60 mg by mouth daily.    [provider]  famotidine (PEPCID) 20 MG tablet Take 40 mg by mouth at bedtime. Patient not taking: Reported on 04/08/2021    [provider]  gabapentin (NEURONTIN) 600 MG tablet Take 600 mg by mouth 3 (three) times daily. 04/11/21   [provider]  HYDROcodone-acetaminophen (NORCO/VICODIN) 5-325 MG tablet Take 1 tablet by mouth every 4 (four) hours as needed for moderate pain. 05/16/21   Mosher, Thalia Bloodgood, PA-C  linaclotide (LINZESS) 290 MCG CAPS  capsule Take 290 mcg by mouth daily as needed (abdominal pain).    [provider]  meclizine (ANTIVERT) 25 MG tablet Take by mouth. 02/12/16   [provider]  metoCLOPramide (REGLAN) 10 MG tablet Take by mouth.    [provider]  metoprolol tartrate (LOPRESSOR) 25 MG tablet  01/30/15   [provider]  mirtazapine (REMERON) 15 MG tablet TAKE 1 TABLET AT BEDTIME 03/28/21   Derwood Kaplan, MD  ondansetron [facility]) 4 MG tablet  10/16/15   [provider]  pantoprazole (PROTONIX) 40 MG tablet Take 40 mg by mouth daily. Patient not taking: Reported on 04/08/2021    [provider]  prochlorperazine (COMPAZINE) 10 MG tablet Take by mouth. 02/25/21   [provider]  rosuvastatin (CRESTOR) 10 MG tablet Take 10 mg by mouth daily. Patient not taking: Reported on 04/08/2021    [provider]  sucralfate (CARAFATE) 1 g tablet Take 1 g by mouth 4 (four) times daily. 03/23/21   [provider]      Allergies    Codeine, Hydrocodone, and Lisinopril    Review of Systems   Review of Systems  Respiratory:  Positive for shortness of breath.   Musculoskeletal:  Positive for extremity weakness.   Physical Exam Updated Vital Signs BP 119/66 (BP Location: Left Arm)    Pulse 64    Temp 98.7 F (37.1 C) (Oral)    Resp 14    LMP  (LMP Unknown)    SpO2 100%  Physical Exam Vitals and nursing note reviewed.  Constitutional:      General: She is not in acute distress.    Appearance: She is well-developed.  HENT:     Head: Normocephalic and atraumatic.  Eyes:     Pupils: Pupils are equal, round, and reactive to light.  Cardiovascular:     Rate and Rhythm: Normal rate and regular rhythm.     Heart sounds: Normal heart sounds. No murmur heard.   No friction rub.  Pulmonary:     Effort: Pulmonary effort is normal. No tachypnea.     Breath sounds: Normal breath sounds. No wheezing or rales.  Abdominal:     General: Bowel  sounds are normal. There is no distension.     Palpations: Abdomen is soft.     Tenderness: There is abdominal tenderness. There is no guarding or rebound.     Comments: Diffuse tenderness throughout the abdomen but no palpable masses or evidence of hernias  Musculoskeletal:        General: No tenderness. Normal range of motion.     Cervical back: Normal range of motion and neck supple.     Right lower leg: No edema.     Left lower leg: No edema.     Comments: No edema  Skin:    General: Skin is warm and dry.     Findings: No rash.  Neurological:     Mental Status: She is alert and oriented to person, place, and time. Mental status is at baseline.     Cranial Nerves: No cranial nerve deficit.     Motor: Weakness present.     Coordination: Coordination normal.     Comments: 4 out of 5 strength in all extremities.  She is able to do heel-to-shin but takes great effort and lifting up her legs to do it.  Sensation is intact throughout but she reports subjectively it does not feel normal seems to be more focused in the hands and feet  Psychiatric:        Mood and Affect: Mood normal.        Behavior: Behavior normal.    ED Results / Procedures / Treatments   Labs (all labs ordered are listed, but only abnormal results are displayed) Labs Reviewed  BASIC METABOLIC PANEL - Abnormal; Notable for the following components:      Result Value   Glucose, Bld 124 (*)    All other components within normal limits  LIPASE, BLOOD - Abnormal; Notable for the following components:   Lipase 66 (*)    All other components within normal limits  CBC WITH DIFFERENTIAL/PLATELET  HEPATIC FUNCTION PANEL  TSH  VITAMIN B12  URINALYSIS, ROUTINE W REFLEX MICROSCOPIC  VITAMIN B1  FOLATE    EKG EKG Interpretation  Date/Time:  Thursday May 23 2021 10:28:35 EST Ventricular Rate:  66 PR Interval:  136 QRS Duration: 80 QT Interval:  400 QTC Calculation: 419 R Axis:   5 Text Interpretation: Normal  sinus rhythm Minimal voltage criteria for LVH, may be normal variant ( R in aVL ) Inferior infarct , age undetermined Cannot rule out Anterior infarct , age undetermined No significant change since last tracing When compared with ECG of 07-May-2020 10:10, PREVIOUS ECG IS PRESENT Confirmed by Blanchie Dessert [phone]) on 05/23/2021 1:09:29 PM  Radiology CT ABDOMEN PELVIS W CONTRAST  Result Date: 05/23/2021 CLINICAL DATA:  Nausea/vomiting; abdominal pain, acute, nonlocalized EXAM: CT ABDOMEN AND PELVIS WITH CONTRAST TECHNIQUE: Multidetector CT imaging of the abdomen and pelvis was performed using the standard protocol following bolus administration of intravenous contrast. RADIATION DOSE REDUCTION: This exam was performed according to the departmental dose-optimization program which includes automated exposure control, adjustment of the mA and/or kV according to patient size and/or use of iterative reconstruction technique. CONTRAST:  122mL OMNIPAQUE IOHEXOL 300 MG/ML  SOLN COMPARISON:  CT abdomen 2020 FINDINGS: Lower chest: No acute abnormality. Hepatobiliary: No focal liver abnormality is seen. Status post cholecystectomy. No unexpected biliary dilatation. Pancreas: Unremarkable. Spleen: Unremarkable. 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 Right adrenal is unremarkable. Kidneys are unremarkable. Bladder is unremarkable. Stomach/Bowel: Stomach is within normal limits. Bowel is normal in caliber. There are postsurgical changes of the right colon. Stool is present throughout the colon, greatest at the rectosigmoid. Vascular/Lymphatic: Diffuse atherosclerosis. Right common iliac measures top-normal at 1.5 cm. No enlarged nodes. Reproductive: Status post hysterectomy. No adnexal masses. Other: No free fluid. Small fat containing inguinal hernias bilaterally. Musculoskeletal: Lower lumbar degenerative changes. IMPRESSION: No acute abnormality. Stool throughout the colon,  greatest at the rectosigmoid. Correlate for constipation. Atherosclerosis. Electronically Signed   By: Macy Mis M.D.   On: 05/23/2021 14:56    Procedures Procedures    Medications Ordered in ED Medications  lactated ringers bolus 1,000  mL (0 mLs Intravenous Stopped 05/23/21 1510)  iohexol (OMNIPAQUE) 300 MG/ML solution 100 mL (100 mLs Intravenous Contrast Given 05/23/21 1441)    ED Course/ Medical Decision Making/ A&P                           Medical Decision Making Amount and/or Complexity of Data Reviewed Labs: ordered. Radiology: ordered.  Risk Prescription drug management.   Pleasant 66 year old female presenting today with multiple complaints.  Concerned that patient may have worsening of her malignant carcinoid tumor causing issues with eating patient may have vitamin deficiencies causing her numbness.  She does not have any symptoms today that would suggest stroke as her symptoms are global.  Mental status is normal.  She denies any infectious etiology.  She has mild diffuse tenderness of her abdomen however no peritoneal signs.  Low concern today for obstruction.  We will do a CT scan of the abdomen to further evaluate as she has not had one done in several years and since her symptoms are worsening will ensure that she does not have recurrence of her disease.  She is getting octreotide regularly unclear if that could cause side effects with her the symptoms she is describing.  She does not use any alcohol products.  She has not had thyroid levels checked in quite some time.  Her follow-up has been patchy in the past but she reports she has follow-up with GI later this month or early next month.  None of their prior records are available in epic.  External medical records from her oncologist office were reviewed.  3:16 PM I independently interpreted patient's CAT scan of her abdomen which appears to show significant constipation.  Radiology reported no acute abnormalities  except for stool throughout the colon greatest in the rectosigmoid area.  Patient has no evidence of recurrence of the tumor.  I independently evaluated patient's labs and her thyroid levels today are normal as well as her LFTs.  Lipase only minimally elevated at 66.  CBC is within normal limits and CMP without acute findings.  Patient's vitamin B12 is normal at 561.  B1 and folate are still pending.  Patient given lactulose to use for constipation.  Considered admission but patient does not meet criteria today given normal vital signs, normal labs and no acute CT findings.  Findings were discussed with the patient in length and encouraged to follow-up which she already has established.  Patient does not have any social barriers for discharge today.        Final Clinical Impression(s) / ED Diagnoses Final diagnoses:  Anorexia  Nausea  Constipation, unspecified constipation type    Rx / DC Orders ED Discharge Orders          Ordered    lactulose (CHRONULAC) 10 GM/15ML solution  Daily PRN        05/23/21 1514              Blanchie Dessert, MD 05/23/21 1518

## 2021-05-23 NOTE — ED Triage Notes (Signed)
Pt. Stated, Ive had leg numbness and arm numbness and I feel like I want to eat but can not. When I try to walk I cant, and Im out of breath a lot.

## 2021-05-23 NOTE — ED Provider Triage Note (Signed)
Emergency Medicine Provider Triage Evaluation Note  Kristi Romero , a 66 y.o. female  was evaluated in triage.  Pt complains of leg numbness and pain, fatigue and weakness for 6-8 months.  Reports that she was evaluated notable times by her PCP without any findings.  No history of CVA or heart failure.  Reports that she smokes marijuana frequently to help her eat.  Review of Systems  As above  Physical Exam  BP 119/66 (BP Location: Left Arm)    Pulse 64    Temp 98.7 F (37.1 C) (Oral)    Resp 14    LMP  (LMP Unknown)    SpO2 100%  Gen:   Awake, no distress   Resp:  Normal effort  MSK:   Moves extremities without difficulty  Other:  RRR, no leg edema, some right sided facial droop  Medical Decision Making  Medically screening exam initiated at 11:01 AM.  Appropriate orders placed.  Kristi Romero was informed that the remainder of the evaluation will be completed by another provider, this initial triage assessment does not replace that evaluation, and the importance of remaining in the ED until their evaluation is complete.     Kristi Hammock, PA-C 05/23/21 1103

## 2021-05-25 LAB — VITAMIN B1: Vitamin B1 (Thiamine): 166.6 nmol/L (ref 66.5–200.0)

## 2021-05-31 ENCOUNTER — Other Ambulatory Visit: Payer: Self-pay

## 2021-06-03 ENCOUNTER — Encounter: Payer: Self-pay | Admitting: Hematology and Oncology

## 2021-06-03 ENCOUNTER — Inpatient Hospital Stay: Payer: Medicare HMO

## 2021-06-03 ENCOUNTER — Inpatient Hospital Stay: Payer: Medicare HMO | Attending: Oncology | Admitting: Hematology and Oncology

## 2021-06-03 ENCOUNTER — Other Ambulatory Visit: Payer: Self-pay

## 2021-06-03 VITALS — BP 106/61 | HR 62 | Temp 98.1°F | Resp 18 | Ht 64.0 in | Wt 145.0 lb

## 2021-06-03 DIAGNOSIS — C7A012 Malignant carcinoid tumor of the ileum: Secondary | ICD-10-CM | POA: Insufficient documentation

## 2021-06-03 DIAGNOSIS — Z79899 Other long term (current) drug therapy: Secondary | ICD-10-CM | POA: Diagnosis not present

## 2021-06-03 MED ORDER — OCTREOTIDE ACETATE 20 MG IM KIT
20.0000 mg | PACK | Freq: Once | INTRAMUSCULAR | Status: AC
  Administered 2021-06-03: 20 mg via INTRAMUSCULAR
  Filled 2021-06-03: qty 1

## 2021-06-03 NOTE — Progress Notes (Signed)
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  Seven of eighteen lymph nodes were involved (7/18).  Primary tumor measured 2.2 x 1.7 x 1.5 cm, with three apparent vascular metastases including a nodule up to 3 cm with metastatic tumor present within 1 mm of the inked radial margin.  In the distal appendix there was also a separate 3 mm carcinoid tumor.  This was a staged as a pT3pN1 with low mitotic rate (0 per 10 high powered fields).  [name] was sporadic. She continues with monthly octreotide. She was evaluated in the ED for increasing abdominal pain and nausea. CT imaging revealed constipation without evidence of obstruction or recurrence of tumor. She saw Dr. Melina Copa who recommended treatment of her constipation and no further imaging. Her weight remains stable today. She will return to clinic in 4 weeks for repeat evaluation.    The patient understands the plans discussed today and is in agreement with them.  She knows to contact our office if she develops concerns prior to her next appointment.     [name], NP  [facility] [address] [place] [address] Dept: [phone] Dept Fax: [phone]   No orders of the defined types were placed in this encounter.     CHIEF COMPLAINT:  CC: A 66 year old female with history of malignant carcinoid tumor of ileum here for 4 week evaluation  Current Treatment:  Sandostatin  INTERVAL HISTORY:   Kristi Romero is here today for repeat clinical assessment. She denies fevers or chills. She denies pain. Her appetite is good. Her weight has been stable.  I have reviewed the past medical history, past surgical history, social history and family history with the patient and they are unchanged from previous note.  ALLERGIES:  is allergic to codeine, hydrocodone, and lisinopril.  MEDICATIONS:  Current Outpatient Medications  Medication Sig Dispense Refill   atorvastatin (LIPITOR) 80 MG tablet Take 80 mg by mouth daily. (Patient not taking: Reported on 04/08/2021)     baclofen (LIORESAL) 10 MG tablet Take 10 mg by mouth 2 (two) times daily.     dicyclomine (BENTYL) 20 MG tablet Take 20 mg by mouth 2 (two) times daily.     divalproex (DEPAKOTE) 250 MG DR tablet Take 250 mg by mouth 2 (two) times daily. (Patient not taking: Reported on 04/08/2021)     DULoxetine (CYMBALTA) 60 MG capsule Take 60 mg by mouth daily.     famotidine (PEPCID) 20 MG tablet Take 40 mg by mouth at bedtime. (Patient not taking: Reported on 04/08/2021)     gabapentin (NEURONTIN) 300 MG capsule Take 300 mg by mouth daily.     HYDROcodone-acetaminophen (NORCO/VICODIN) 5-325 MG tablet Take 1 tablet by mouth every 4 (four) hours as needed for moderate pain. 60 tablet 0   lactulose (CHRONULAC) 10 GM/15ML solution Take 15 mLs (10 g total) by mouth daily as needed for mild constipation or moderate constipation. Start off by taking 1 time a day but if that does not produce a good bowel movement then  increase to 2 times a day 236 mL 0   linaclotide (LINZESS) 290 MCG CAPS capsule Take 290 mcg by mouth daily as needed (abdominal pain).     meclizine (ANTIVERT) 25 MG tablet Take by mouth.     metoCLOPramide (REGLAN) 10 MG tablet Take by mouth.     metoprolol tartrate (LOPRESSOR) 25 MG tablet      mirtazapine (REMERON) 15 MG tablet TAKE 1 TABLET AT BEDTIME 90 tablet 3   ondansetron (ZOFRAN) 4 MG tablet      pantoprazole (PROTONIX) 40 MG  tablet Take 40 mg by mouth daily. (Patient not taking: Reported on 04/08/2021)     prochlorperazine (COMPAZINE) 10 MG tablet Take by mouth.     rosuvastatin (CRESTOR) 10 MG tablet Take 10 mg by mouth daily. (Patient not taking: Reported on 04/08/2021)     sucralfate (CARAFATE) 1 g tablet Take 1 g by mouth 4 (four) times daily.     No current facility-administered medications for this visit.    HISTORY OF PRESENT ILLNESS:   Oncology History  Malignant carcinoid tumor of ileum ([place])  04/28/2014 Initial Diagnosis   Malignant carcinoid tumor of ileum ([place])   02/02/2015 Cancer Staging   Staging form: Neuroendocrine Tumor - Duodenum/Ampulla/Jejunum/Illeum, AJCC 7th Edition - Clinical stage from 02/02/2015: Stage IIIB (T3(3), N1, M0) - Signed by Derwood Kaplan, MD on 05/28/2020 Staged by: Managing physician Diagnostic confirmation: Positive histology Specimen type: Excision Histopathologic type: Carcinoid tumor, NOS Stage prefix: Initial diagnosis Tumor size (mm): 22 Multiple tumors: Yes Number of tumors: 3 Histologic grade (G): G1 Lymph-vascular invasion (LVI): LVI not present (absent)/not identified Residual tumor (R): R0 - None Stage used in treatment planning: Yes National guidelines used in treatment planning: Yes Type of national guideline used in treatment planning: NCCN Staging comments: Surgical resection        REVIEW OF SYSTEMS:   Constitutional: Denies fevers, chills or abnormal weight loss Eyes: Denies blurriness of vision Ears, nose, mouth, throat, and face: Denies mucositis or sore throat Respiratory: Denies cough, dyspnea or wheezes Cardiovascular: Denies palpitation, chest discomfort or lower extremity swelling Gastrointestinal:  Denies nausea, heartburn or change in bowel habits Skin: Denies abnormal skin rashes Lymphatics: Denies new lymphadenopathy or easy bruising Neurological:Denies numbness, tingling or new weaknesses Behavioral/Psych: Mood is stable,  no new changes  All other systems were reviewed with the patient and are negative.   VITALS:  Blood pressure 114/60, pulse 62, temperature 98.1 F (36.7 C), temperature source Oral, resp. rate 16, height 5\' 4"  (1.626 m), weight 145 lb 14.4 oz (66.2 kg), SpO2 100 %.  Wt Readings from Last 3 Encounters:  06/03/21 145 lb 14.4 oz (66.2 kg)  05/06/21 142 lb 1.6 oz (64.5 kg)  04/08/21 139 lb 4 oz (63.2 kg)    Body mass index is 25.04 kg/m.  Performance status (ECOG): 1 - Symptomatic but completely ambulatory  PHYSICAL EXAM:   GENERAL:alert, no distress and comfortable SKIN: skin color, texture, turgor are normal, no rashes or significant lesions EYES: normal, Conjunctiva are pink and non-injected, sclera clear OROPHARYNX:no exudate, no erythema and lips, buccal mucosa, and tongue normal  NECK: supple, thyroid normal size, non-tender, without nodularity LYMPH:  no palpable lymphadenopathy in the cervical, axillary or inguinal LUNGS: clear to auscultation and percussion with normal breathing effort HEART: regular rate & rhythm and no murmurs and no lower extremity edema ABDOMEN:abdomen soft, non-tender and normal bowel sounds Musculoskeletal:no cyanosis of digits and no clubbing  NEURO: alert & oriented x  3 with fluent speech, no focal motor/sensory deficits  LABORATORY DATA:  I have reviewed the data as listed    Component Value Date/Time   NA 137 05/23/2021 1101   NA 137 05/06/2021 0000   K 3.8 05/23/2021 1101   CL 100 05/23/2021 1101   CO2 31 05/23/2021 1101   GLUCOSE 124 (H) 05/23/2021 1101   BUN 20 05/23/2021 1101   BUN 20 05/06/2021 0000   CREATININE 0.86 05/23/2021 1101   CALCIUM 9.7 05/23/2021 1101   PROT 7.9 05/23/2021 1341   PROT 8.6 (A) 03/11/2021 0000   ALBUMIN 3.8 05/23/2021 1341   AST 21 05/23/2021 1341   ALT 20 05/23/2021 1341   ALKPHOS 70 05/23/2021 1341   BILITOT 0.3 05/23/2021 1341   GFRNONAA >60 05/23/2021 1101   GFRAA >60 03/19/2018 1206    No  results found for: SPEP, UPEP  Lab Results  Component Value Date   WBC 9.0 05/23/2021   NEUTROABS 4.2 05/23/2021   HGB 13.8 05/23/2021   HCT 42.7 05/23/2021   MCV 99.1 05/23/2021   PLT 250 05/23/2021      Chemistry      Component Value Date/Time   NA 137 05/23/2021 1101   NA 137 05/06/2021 0000   K 3.8 05/23/2021 1101   CL 100 05/23/2021 1101   CO2 31 05/23/2021 1101   BUN 20 05/23/2021 1101   BUN 20 05/06/2021 0000   CREATININE 0.86 05/23/2021 1101   GLU 111 05/06/2021 0000      Component Value Date/Time   CALCIUM 9.7 05/23/2021 1101   ALKPHOS 70 05/23/2021 1341   AST 21 05/23/2021 1341   ALT 20 05/23/2021 1341   BILITOT 0.3 05/23/2021 1341       RADIOGRAPHIC STUDIES: I have personally reviewed the radiological images as listed and agreed with the findings in the report. CT ABDOMEN PELVIS W CONTRAST  Result Date: 05/23/2021 CLINICAL DATA:  Nausea/vomiting; abdominal pain, acute, nonlocalized EXAM: CT ABDOMEN AND PELVIS WITH CONTRAST TECHNIQUE: Multidetector CT imaging of the abdomen and pelvis was performed using the standard protocol following bolus administration of intravenous contrast. RADIATION DOSE REDUCTION: This exam was performed according to the departmental dose-optimization program which includes automated exposure control, adjustment of the mA and/or kV according to patient size and/or use of iterative reconstruction technique. CONTRAST:  136mL OMNIPAQUE IOHEXOL 300 MG/ML  SOLN COMPARISON:  CT abdomen 2020 FINDINGS: Lower chest: No acute abnormality. Hepatobiliary: No focal liver abnormality is seen. Status post cholecystectomy. No unexpected biliary dilatation. Pancreas: Unremarkable. Spleen: Unremarkable. 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 Right adrenal is unremarkable. Kidneys are unremarkable. Bladder is unremarkable. Stomach/Bowel: Stomach is within normal limits. Bowel is normal in caliber. There are  postsurgical changes of the right colon. Stool is present throughout the colon, greatest at the rectosigmoid. Vascular/Lymphatic: Diffuse atherosclerosis. Right common iliac measures top-normal at 1.5 cm. No enlarged nodes. Reproductive: Status post hysterectomy. No adnexal masses. Other: No free fluid. Small fat containing inguinal hernias bilaterally. Musculoskeletal: Lower lumbar degenerative changes. IMPRESSION: No acute abnormality. Stool throughout the colon, greatest at the rectosigmoid. Correlate for constipation. Atherosclerosis. Electronically Signed   By: Macy Mis M.D.   On: 05/23/2021 14:56

## 2021-06-03 NOTE — Assessment & Plan Note (Addendum)
History of neuroendocrine/carcinoid tumor of the ascending colon, diagnosed in October 2016, treated with surgical resection.  Seven of eighteen lymph nodes were involved (7/18).  Primary tumor measured 2.2 x 1.7 x 1.5 cm, with three apparent vascular metastases including a nodule up to 3 cm with metastatic tumor present within 1 mm of the inked radial margin.  In the distal appendix there was also a separate 3 mm carcinoid tumor.  This was a staged as a pT3pN1 with low mitotic rate (0 per 10 high powered fields).  [name] was sporadic. She continues with monthly octreotide. She was evaluated in the ED for increasing abdominal pain and nausea. CT imaging revealed constipation without evidence of obstruction or recurrence of tumor. She saw Dr. Melina Copa who recommended treatment of her constipation and no further imaging. Her weight remains stable today. She will return to clinic in 4 weeks for repeat evaluation.

## 2021-06-03 NOTE — Patient Instructions (Signed)
Octreotide injection solution What is this medication? OCTREOTIDE (ok TREE oh tide) is used to reduce blood levels of growth hormone in patients with a condition called acromegaly. This medicine also reduces flushing and watery diarrhea caused by certain types of cancer. This medicine may be used for other purposes; ask your health care provider or pharmacist if you have questions. COMMON BRAND NAME(S): Bynfezia, Sandostatin What should I tell my care team before I take this medication? They need to know if you have any of these conditions: diabetes gallbladder disease kidney disease liver disease thyroid disease an unusual or allergic reaction to octreotide, other medicines, foods, dyes, or preservatives pregnant or trying to get pregnant breast-feeding How should I use this medication? This medicine is for injection under the skin or into a vein (only in emergency situations). It is usually given by a health care professional in a hospital or clinic setting. If you get this medicine at home, you will be taught how to prepare and give this medicine. Allow the injection solution to come to room temperature before use. Do not warm it artificially. Use exactly as directed. Take your medicine at regular intervals. Do not take your medicine more often than directed. It is important that you put your used needles and syringes in a special sharps container. Do not put them in a trash can. If you do not have a sharps container, call your pharmacist or healthcare provider to get one. Talk to your pediatrician regarding the use of this medicine in children. Special care may be needed. Overdosage: If you think you have taken too much of this medicine contact a poison control center or emergency room at once. NOTE: This medicine is only for you. Do not share this medicine with others. What if I miss a dose? If you miss a dose, take it as soon as you can. If it is almost time for your next dose, take only  that dose. Do not take double or extra doses. What may interact with this medication? bromocriptine certain medicines for blood pressure, heart disease, irregular heartbeat cyclosporine diuretics medicines for diabetes, including insulin quinidine This list may not describe all possible interactions. Give your health care provider a list of all the medicines, herbs, non-prescription drugs, or dietary supplements you use. Also tell them if you smoke, drink alcohol, or use illegal drugs. Some items may interact with your medicine. What should I watch for while using this medication? Visit your doctor or health care professional for regular checks on your progress. To help reduce irritation at the injection site, use a different site for each injection and make sure the solution is at room temperature before use. This medicine may cause decreases in blood sugar. Signs of low blood sugar include chills, cool, pale skin or cold sweats, drowsiness, extreme hunger, fast heartbeat, headache, nausea, nervousness or anxiety, shakiness, trembling, unsteadiness, tiredness, or weakness. Contact your doctor or health care professional right away if you experience any of these symptoms. This medicine may increase blood sugar. Ask your healthcare provider if changes in diet or medicines are needed if you have diabetes. This medicine may cause a decrease in vitamin B12. You should make sure that you get enough vitamin B12 while you are taking this medicine. Discuss the foods you eat and the vitamins you take with your health care professional. What side effects may I notice from receiving this medication? Side effects that you should report to your doctor or health care professional as soon as   possible: allergic reactions like skin rash, itching or hives, swelling of the face, lips, or tongue fast, slow, or irregular heartbeat right upper belly pain severe stomach pain signs and symptoms of high blood sugar such  as being more thirsty or hungry or having to urinate more than normal. You may also feel very tired or have blurry vision. signs and symptoms of low blood sugar such as feeling anxious; confusion; dizziness; increased hunger; unusually weak or tired; increased sweating; shakiness; cold, clammy skin; irritable; headache; blurred vision; fast heartbeat; loss of consciousness unusually weak or tired Side effects that usually do not require medical attention (report to your doctor or health care professional if they continue or are bothersome): diarrhea dizziness gas headache nausea, vomiting pain, redness, or irritation at site where injected upset stomach This list may not describe all possible side effects. Call your doctor for medical advice about side effects. You may report side effects to FDA at 1-800-FDA-1088. Where should I keep my medication? Keep out of the reach of children. Store in a refrigerator between 2 and 8 degrees C (36 and 46 degrees F). Protect from light. Allow to come to room temperature naturally. Do not use artificial heat. If protected from light, the injection may be stored at room temperature between 20 and 30 degrees C (70 and 86 degrees F) for 14 days. After the initial use, throw away any unused portion of a multiple dose vial after 14 days. Throw away unused portions of the ampules after use. NOTE: This sheet is a summary. It may not cover all possible information. If you have questions about this medicine, talk to your doctor, pharmacist, or health care provider.  2022 [employer]vier/Gold Standard (2018-11-11 00:00:00)  

## 2021-06-23 ENCOUNTER — Other Ambulatory Visit: Payer: Self-pay | Admitting: Hematology and Oncology

## 2021-06-24 ENCOUNTER — Encounter: Payer: Self-pay | Admitting: Oncology

## 2021-06-27 NOTE — Progress Notes (Signed)
[facility]  [address] [place],  [place]  [address] [phone]  Clinic Day:  07/02/2021  Referring physician: Barnetta Chapel, Romero  This document serves as a record of services personally performed by Kristi Poisson, MD. It was created on their behalf by Kristi Romero, a trained medical scribe. The creation of this record is based on the scribe's personal observations and the provider's statements to them.  ASSESSMENT & PLAN:   Assessment & Plan: 1.  History of neuroendocrine/carcinoid tumor of the ascending colon, diagnosed in October 2016, treated with surgical resection.  Seven of eighteen lymph nodes were involved (7/18).  Primary tumor measured 2.2 x 1.7 x 1.5 cm, with three apparent vascular metastases including a nodule up to 3 cm with metastatic tumor present within 1 mm of the inked radial margin.  In the distal appendix there was also a separate 3 mm carcinoid tumor.  This was a staged as a pT3pN1 with low mitotic rate (0 per 10 high powered fields).  [name] was sporadic.  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  24 hour urine for 5HIAA was normal.  Chromogranin A from February was elevated at 262.3, but CT imaging was stable with no evidence of residual or recurrent disease.  She was placed on monthly octreotide injections in March 2022 with good response based on her symptoms and her decreasing chromogranin A, last measuring 48.8 in August.  CT imaging from August remains without evidence of recurrent or metastatic disease.   2.  Nausea and regurgitation. No clear etiology has been found, but she is now taking 5 Ensures daily and has finally gained weight. She has actually gained 12 pounds recently.  3.  Paresthesias and numbness of both lower extremities for at least the last 6 months. She is unable to ambulate. 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 She was on a higher dose of gabapentin but felt this wasn't helping and so is down to 300 mg at bedtime. I think it would be worthwhile to get an MRI of the spine to see her degenerative changes, but she is not likely a surgical candidate.    4.  We searched for any central nervous system etiology for her problems and got an MRI of the brain which does show chronic ischemic changes.  It could be related to her marijuana usage as well as her depression.   She will proceed with octreotide today and continue with these monthly. As above, we will order MRI of the lumbosacral spine, and call her with the results. We will see her back in 4 weeks with CBC, CMP and Chromogranin A for repeat evaluation.  Both her and her aide verbalize understanding of and agreement to the plans discussed today. They know to call the office should any new questions or concerns arise.  I provided 15 minutes of face-to-face time during this this encounter and > 50% was spent counseling as documented under my assessment and plan.     [facility] [address] [place] [address] Dept: [phone] Dept Fax: [phone]   No orders of the defined types were placed in this encounter.      CHIEF COMPLAINT:  CC: Carcinoid syndrome  Current Treatment: Octreotide every 4 weeks  HISTORY OF PRESENT ILLNESS:  Kristi Romero is a 66 year old. female referred by Kristi Romero for a transfer of care and further management of her history of neuroendocrine and carcinoid tumor, which was diagnosed in October 2016.  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   She underwent surgical resection with Kristi Romero. Pathology confirmed well differentiated neuroendocrine tumor consistent with carcinoid tumor.  Seven of eighteen lymph nodes were involved (7/18).  Primary tumor measured 2.2 x 1.7 x 1.5 cm, with three apparent vascular metastases including a nodule up to 3 cm with metastatic tumor present within 1 mm of the inked radial margin.  In the distal appendix there was also a separate 3 mm carcinoid tumor.  This was a staged as a pT3pN1 with low mitotic rate (0 per 10 high powered fields).  She did require a PEG tube in 2017 for failure to thrive and this was removed in January 2018.  Her weight was up to 157 by September 2021.   Her last follow up was in January 2020 with Kristi Romero until we started seeing her in early February 2022.  Kristi Romero complained of weight loss, stomach pain and nausea.  She was evaluated by Kristi Romero with endoscopy in November 2021 which revealed chronic gastritis and was negative for H.Pylori.  No evidence of malignancy was observed.  She also notes bilateral numbness "from her mouth to her feet".   She receives B12 injections every 6 months.  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  Chromogranin A from February was elevated at 262.3. She also had an elevated serum protein. Serum protein electrophoresis did not reveal a monoclonal spike. CT imaging revealed adrenal adenomas but no obvious evidence of recurrent/progressive disease.  She was started on octreotide injection in March 2022 with good response based on her decreasing chromogranin A.  CT chest, abdomen and pelvis in August revealed postoperative changes of right hemicolectomy with enterocolonic anastomosis without evidence of local recurrence or abdominopelvic metastatic disease.  Stable left adrenal gland adenoma.  MRI scan of the brain in August revealed evidence of chronic ischemia. She had normalization of the Chromogranin A  in April. Chromogranin A from August was 48.8 down from 61.4 in April. The chromogranin A was 51 in September.  She continues octreotide every 4 weeks.   INTERVAL HISTORY:  Kristi Romero is here today for repeat clinical assessment prior to her next octreotide. She continues to have issues with nausea with oral intake. She states that he appetite is good, the issue is getting and keeping the food down. Thorough evaluation has been negative. She reports numbness and paresthesias of the bilateral legs, which makes it difficult for her to ambulate. We will evaluate further with MRI imaging for the lumbosacral spine. Prior CT imaging revealed significant degenerative changes of L5-S1 and MRI of the cervical spine years ago did reveal severe degenerative changes with moderate spinal stenosis. She tells me she did have surgery on the neck in [facility] but has not followed up. She does have occasional neck pain. Blood counts are unremarkable.  Her  appetite is good, and she has gained 12 pounds since her last visit. She supplements with Ensure.  She denies fever, chills or other  signs of infection.  She denies nausea, vomiting, bowel issues, or abdominal pain.  She denies sore throat, cough, dyspnea, or chest pain.  REVIEW OF SYSTEMS:  Review of Systems  Constitutional: Negative.  Negative for appetite change, chills, fatigue, fever and unexpected weight change.  HENT:  Negative.    Eyes: Negative.   Respiratory: Negative.  Negative for chest tightness, cough, hemoptysis, shortness of breath and wheezing.   Cardiovascular: Negative.  Negative for chest pain, leg swelling and palpitations.  Gastrointestinal:  Positive for nausea. Negative for abdominal distention, abdominal pain, blood in stool, constipation, diarrhea and vomiting.  Endocrine: Negative.   Genitourinary: Negative.  Negative for difficulty urinating, dysuria, frequency and hematuria.   Musculoskeletal:  Positive for gait problem (in a wheelchair due  to pain with ambulation) and neck pain (occasional). Negative for arthralgias, back pain, flank pain and myalgias.  Skin: Negative.   Neurological:  Positive for gait problem (in a wheelchair due to pain with ambulation) and numbness (and paresthesias of the bilateral legs). Negative for dizziness, extremity weakness, headaches, light-headedness, seizures and speech difficulty.  Hematological: Negative.   Psychiatric/Behavioral: Negative.  Negative for depression and sleep disturbance. The patient is not nervous/anxious.     VITALS:  Blood pressure 110/62, pulse 79, temperature 98 F (36.7 C), temperature source Oral, resp. rate 18, height 5\' 4"  (1.626 m), weight 157 lb (71.2 kg), SpO2 96 %.  Wt Readings from Last 3 Encounters:  07/02/21 157 lb (71.2 kg)  06/03/21 145 lb (65.8 kg)  06/03/21 145 lb 14.4 oz (66.2 kg)    Body mass index is 26.95 kg/m.  Performance status (ECOG): 2 - Symptomatic, <50% confined to bed  PHYSICAL EXAM:  Physical Exam Constitutional:      General: She is not in acute distress.    Appearance: Normal appearance. She is normal weight.  HENT:     Head: Normocephalic and atraumatic.  Eyes:     General: No scleral icterus.    Extraocular Movements: Extraocular movements intact.     Conjunctiva/sclera: Conjunctivae normal.     Pupils: Pupils are equal, round, and reactive to light.  Cardiovascular:     Rate and Rhythm: Normal rate and regular rhythm.     Pulses: Normal pulses.     Heart sounds: Normal heart sounds. No murmur heard.   No friction rub. No gallop.  Pulmonary:     Effort: Pulmonary effort is normal. No respiratory distress.     Breath sounds: Normal breath sounds.  Abdominal:     General: Bowel sounds are normal. There is no distension.     Palpations: Abdomen is soft. There is no hepatomegaly, splenomegaly or mass.     Tenderness: There is no abdominal tenderness.  Musculoskeletal:        General: Normal range of motion.     Cervical back:  Normal range of motion and neck supple.     Right lower leg: No edema.     Left lower leg: No edema.  Lymphadenopathy:     Cervical: No cervical adenopathy.  Skin:    General: Skin is warm and dry.  Neurological:     General: No focal deficit present.     Mental Status: She is alert and oriented to person, place, and time. Mental status is at baseline.  Psychiatric:        Mood and Affect: Mood normal.        Behavior: Behavior normal.        Thought Content:  Thought content normal.        Judgment: Judgment normal.    LABS:   CBC Latest Ref Rng & Units 07/02/2021 05/23/2021 05/06/2021  WBC - 8.7 9.0 7.8  Hemoglobin 12.0 - 16.0 13.3 13.8 13.3  Hematocrit 36 - 46 41 42.7 40  Platelets 150 - 399 220 250 244   CMP Latest Ref Rng & Units 07/02/2021 05/23/2021 05/06/2021  Glucose 70 - 99 mg/dL - 124(H) -  BUN 4 - 21 23(A) 20 20  Creatinine 0.5 - 1.1 0.7 0.86 0.6  Sodium 137 - 147 140 137 137  Potassium 3.4 - 5.3 4.1 3.8 4.1  Chloride 99 - 108 103 100 104  CO2 13 - 22 32(A) 31 30(A)  Calcium 8.7 - 10.7 9.8 9.7 9.3  Total Protein 6.5 - 8.1 g/dL - 7.9 -  Total Bilirubin 0.3 - 1.2 mg/dL - 0.3 -  Alkaline Phos 25 - 125 65 70 64  AST 13 - 35 29 21 24   ALT 7 - 35 22 20 15     Lab Results  Component Value Date   TOTALPROTELP 6.6 06/01/2020   ALBUMINELP 3.1 06/01/2020   A1GS 0.3 06/01/2020   A2GS 0.8 06/01/2020   BETS 1.1 06/01/2020   GAMS 1.4 06/01/2020   MSPIKE Not Observed 06/01/2020   SPEI Comment 06/01/2020    STUDIES:  No results found.    HISTORY:   Allergies:  Allergies  Allergen Reactions   Lisinopril Swelling and Anaphylaxis    Other reaction(s): Other (See Comments)   Codeine Nausea Only and Nausea And Vomiting    Other reaction(s): GI Upset (intolerance)   Hydrocodone     Current Medications: Current Outpatient Medications  Medication Sig Dispense Refill   famotidine (PEPCID) 20 MG tablet Take by mouth.     gabapentin (NEURONTIN) 300 MG capsule Take by  mouth at bedtime.     pantoprazole (PROTONIX) 40 MG tablet Take 1 tablet by mouth daily.     baclofen (LIORESAL) 10 MG tablet Take 10 mg by mouth 2 (two) times daily.     dicyclomine (BENTYL) 20 MG tablet Take 20 mg by mouth 2 (two) times daily.     DULoxetine (CYMBALTA) 60 MG capsule Take 60 mg by mouth daily.     HYDROcodone-acetaminophen (NORCO/VICODIN) 5-325 MG tablet Take 1 tablet by mouth every 4 (four) hours as needed for moderate pain. 60 tablet 0   lactulose (CHRONULAC) 10 GM/15ML solution Take 15 mLs (10 g total) by mouth daily as needed for mild constipation or moderate constipation. Start off by taking 1 time a day but if that does not produce a good bowel movement then increase to 2 times a day 236 mL 0   meclizine (ANTIVERT) 25 MG tablet Take by mouth.     metoCLOPramide (REGLAN) 10 MG tablet Take by mouth.     metoprolol tartrate (LOPRESSOR) 25 MG tablet      mirtazapine (REMERON) 15 MG tablet TAKE 1 TABLET AT BEDTIME 90 tablet 3   ondansetron (ZOFRAN) 4 MG tablet      prochlorperazine (COMPAZINE) 10 MG tablet Take by mouth.     No current facility-administered medications for this visit.     I, Rita Ohara, am acting as scribe for Derwood Kaplan, MD  I have reviewed this report as typed by the medical scribe, and it is complete and accurate.

## 2021-07-02 ENCOUNTER — Other Ambulatory Visit: Payer: Self-pay | Admitting: Pharmacist

## 2021-07-02 ENCOUNTER — Inpatient Hospital Stay: Payer: Medicare HMO

## 2021-07-02 ENCOUNTER — Encounter: Payer: Self-pay | Admitting: Oncology

## 2021-07-02 ENCOUNTER — Inpatient Hospital Stay: Payer: Medicare HMO | Attending: Oncology | Admitting: Oncology

## 2021-07-02 ENCOUNTER — Other Ambulatory Visit: Payer: Self-pay

## 2021-07-02 VITALS — BP 110/62 | HR 79 | Temp 98.0°F | Resp 18 | Ht 64.0 in | Wt 157.0 lb

## 2021-07-02 VITALS — BP 91/64 | HR 74 | Temp 98.0°F | Resp 18 | Ht 64.0 in | Wt 157.0 lb

## 2021-07-02 DIAGNOSIS — C7A012 Malignant carcinoid tumor of the ileum: Secondary | ICD-10-CM | POA: Diagnosis present

## 2021-07-02 DIAGNOSIS — Z79899 Other long term (current) drug therapy: Secondary | ICD-10-CM | POA: Insufficient documentation

## 2021-07-02 DIAGNOSIS — E340 Carcinoid syndrome: Secondary | ICD-10-CM | POA: Insufficient documentation

## 2021-07-02 LAB — BASIC METABOLIC PANEL
BUN: 23 — AB (ref 4–21)
CO2: 32 — AB (ref 13–22)
Chloride: 103 (ref 99–108)
Creatinine: 0.7 (ref 0.5–1.1)
Glucose: 96
Potassium: 4.1 (ref 3.4–5.3)
Sodium: 140 (ref 137–147)

## 2021-07-02 LAB — HEPATIC FUNCTION PANEL
ALT: 22 (ref 7–35)
AST: 29 (ref 13–35)
Alkaline Phosphatase: 65 (ref 25–125)
Bilirubin, Total: 0.4

## 2021-07-02 LAB — CBC AND DIFFERENTIAL
HCT: 41 (ref 36–46)
Hemoglobin: 13.3 (ref 12.0–16.0)
Neutrophils Absolute: 4.09
Platelets: 220 (ref 150–399)
WBC: 8.7

## 2021-07-02 LAB — COMPREHENSIVE METABOLIC PANEL
Albumin: 4.4 (ref 3.5–5.0)
Calcium: 9.8 (ref 8.7–10.7)

## 2021-07-02 LAB — CBC: RBC: 4.23 (ref 3.87–5.11)

## 2021-07-02 MED ORDER — OCTREOTIDE ACETATE 20 MG IM KIT
20.0000 mg | PACK | Freq: Once | INTRAMUSCULAR | Status: AC
  Administered 2021-07-02: 20 mg via INTRAMUSCULAR
  Filled 2021-07-02: qty 1

## 2021-07-02 NOTE — Patient Instructions (Signed)
Octreotide acetate injection suspension What is this medication? OCTREOTIDE (ok TREE oh tide) is used to reduce blood levels of growth hormone in patients with a condition called acromegaly. This medicine also reduces flushing and watery diarrhea caused by certain types of cancer. This medicine may be used for other purposes; ask your health care provider or pharmacist if you have questions. COMMON BRAND NAME(S): Sandostatin LAR What should I tell my care team before I take this medication? They need to know if you have any of these conditions: diabetes gallbladder disease kidney disease liver disease thyroid disease an unusual or allergic reaction to octreotide, other medicines, foods, dyes, or preservatives pregnant or trying to get pregnant breast-feeding How should I use this medication? This medicine is for injection into a muscle. It is usually given by a health care professional in a hospital or clinic setting. Talk to your pediatrician regarding the use of this medicine in children. Special care may be needed. Overdosage: If you think you have taken too much of this medicine contact a poison control center or emergency room at once. NOTE: This medicine is only for you. Do not share this medicine with others. What if I miss a dose? Keep appointments for follow-up doses. It is important not to miss your dose. Call your doctor or health care professional if you are unable to keep an appointment. What may interact with this medication? Do not take this medicine with any of the following medications: cisapride dronedarone flibanserin lutetium Lu 177 dotatate pimozide saquinavir thioridazine This medicine may also interact with the following medications: bromocriptine certain medicines for blood pressure, heart disease, irregular heartbeat cyclosporine diuretics medicines for diabetes, including insulin quinidine This list may not describe all possible interactions. Give your  health care provider a list of all the medicines, herbs, non-prescription drugs, or dietary supplements you use. Also tell them if you smoke, drink alcohol, or use illegal drugs. Some items may interact with your medicine. What should I watch for while using this medication? Visit your health care professional for regular checks on your progress. Tell your health care professional if your symptoms do not start to get better or if they get worse. This medicine may cause decreases in blood sugar. Signs of low blood sugar include chills, cool, pale skin or cold sweats, drowsiness, extreme hunger, fast heartbeat, headache, nausea, nervousness or anxiety, shakiness, trembling, unsteadiness, tiredness, or weakness. Contact your doctor or health care professional right away if you experience any of these symptoms. This medicine may increase blood sugar. Ask your healthcare provider if changes in diet or medicines are needed if you have diabetes. This medicine may cause a decrease in vitamin B12. You should make sure that you get enough vitamin B12 while you are taking this medicine. Discuss the foods you eat and the vitamins you take with your health care professional. What side effects may I notice from receiving this medication? Side effects that you should report to your doctor or health care professional as soon as possible: allergic reactions like skin rash, itching or hives, swelling of the face, lips, or tongue fast, slow, or irregular heartbeat right upper belly pain severe stomach pain signs and symptoms of high blood sugar such as being more thirsty or hungry or having to urinate more than normal. You may also feel very tired or have blurry vision. signs and symptoms of low blood sugar such as feeling anxious; confusion; dizziness; increased hunger; unusually weak or tired; increased sweating; shakiness; cold, clammy skin;   irritable; headache; blurred vision; fast heartbeat; loss of  consciousness unusually weak or tired Side effects that usually do not require medical attention (report these to your doctor or health care professional if they continue or are bothersome): diarrhea dizziness gas headache nausea, vomiting pain, redness, or irritation at site where injected upset stomach This list may not describe all possible side effects. Call your doctor for medical advice about side effects. You may report side effects to FDA at 1-800-FDA-1088. Where should I keep my medication? This medicine is given in a hospital or clinic and will not be stored at home. NOTE: This sheet is a summary. It may not cover all possible information. If you have questions about this medicine, talk to your doctor, pharmacist, or health care provider.  2022 Elsevier/Gold Standard (2019-08-02 00:00:00)  

## 2021-07-03 ENCOUNTER — Other Ambulatory Visit: Payer: Self-pay | Admitting: Oncology

## 2021-07-03 ENCOUNTER — Ambulatory Visit: Payer: Medicare HMO

## 2021-07-03 DIAGNOSIS — R202 Paresthesia of skin: Secondary | ICD-10-CM

## 2021-07-03 DIAGNOSIS — R200 Anesthesia of skin: Secondary | ICD-10-CM

## 2021-07-07 ENCOUNTER — Other Ambulatory Visit: Payer: Self-pay | Admitting: Oncology

## 2021-07-07 ENCOUNTER — Encounter: Payer: Self-pay | Admitting: Oncology

## 2021-07-07 DIAGNOSIS — C7A012 Malignant carcinoid tumor of the ileum: Secondary | ICD-10-CM

## 2021-07-09 ENCOUNTER — Encounter: Payer: Self-pay | Admitting: Oncology

## 2021-07-30 ENCOUNTER — Encounter: Payer: Self-pay | Admitting: Oncology

## 2021-07-30 NOTE — Addendum Note (Signed)
Addended by: Juanetta Beets on: 07/30/2021 02:35 PM ? ? Modules accepted: Orders ? ?
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
## 2021-07-31 ENCOUNTER — Inpatient Hospital Stay: Payer: Medicare HMO

## 2021-07-31 ENCOUNTER — Inpatient Hospital Stay: Payer: Medicare HMO | Attending: Oncology | Admitting: Hematology and Oncology

## 2021-07-31 ENCOUNTER — Other Ambulatory Visit: Payer: Self-pay | Admitting: Hematology and Oncology

## 2021-07-31 ENCOUNTER — Encounter: Payer: Self-pay | Admitting: Hematology and Oncology

## 2021-07-31 VITALS — BP 133/81 | HR 77 | Temp 98.0°F | Resp 18 | Wt 157.2 lb

## 2021-07-31 DIAGNOSIS — C7A012 Malignant carcinoid tumor of the ileum: Secondary | ICD-10-CM

## 2021-07-31 LAB — HEPATIC FUNCTION PANEL
ALT: 21 U/L (ref 7–35)
AST: 28 (ref 13–35)
Alkaline Phosphatase: 73 (ref 25–125)
Bilirubin, Total: 0.5

## 2021-07-31 LAB — BASIC METABOLIC PANEL
BUN: 14 (ref 4–21)
CO2: 28 — AB (ref 13–22)
Chloride: 103 (ref 99–108)
Creatinine: 0.8 (ref 0.5–1.1)
Glucose: 162
Potassium: 4.3 mEq/L (ref 3.5–5.1)
Sodium: 139 (ref 137–147)

## 2021-07-31 LAB — CBC AND DIFFERENTIAL
HCT: 42 (ref 36–46)
Hemoglobin: 13.6 (ref 12.0–16.0)
Neutrophils Absolute: 3.32
Platelets: 226 10*3/uL (ref 150–400)
WBC: 7.9

## 2021-07-31 LAB — COMPREHENSIVE METABOLIC PANEL
Albumin: 4.2 (ref 3.5–5.0)
Calcium: 9.8 (ref 8.7–10.7)

## 2021-07-31 LAB — CBC: RBC: 4.36 (ref 3.87–5.11)

## 2021-07-31 MED ORDER — OCTREOTIDE ACETATE 20 MG IM KIT
20.0000 mg | PACK | Freq: Once | INTRAMUSCULAR | Status: AC
  Administered 2021-07-31: 20 mg via INTRAMUSCULAR
  Filled 2021-07-31: qty 1

## 2021-07-31 NOTE — Addendum Note (Signed)
Addended byGeorgette Shell on: 07/31/2021 04:32 PM ? ? Modules accepted: Orders ? ?
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
## 2021-07-31 NOTE — Patient Instructions (Signed)
Octreotide injection solution What is this medication? OCTREOTIDE (ok TREE oh tide) is used to reduce blood levels of growth hormone in patients with a condition called acromegaly. This medicine also reduces flushing and watery diarrhea caused by certain types of cancer. This medicine may be used for other purposes; ask your health care provider or pharmacist if you have questions. COMMON BRAND NAME(S): Bynfezia, Sandostatin What should I tell my care team before I take this medication? They need to know if you have any of these conditions: diabetes gallbladder disease kidney disease liver disease thyroid disease an unusual or allergic reaction to octreotide, other medicines, foods, dyes, or preservatives pregnant or trying to get pregnant breast-feeding How should I use this medication? This medicine is for injection under the skin or into a vein (only in emergency situations). It is usually given by a health care professional in a hospital or clinic setting. If you get this medicine at home, you will be taught how to prepare and give this medicine. Allow the injection solution to come to room temperature before use. Do not warm it artificially. Use exactly as directed. Take your medicine at regular intervals. Do not take your medicine more often than directed. It is important that you put your used needles and syringes in a special sharps container. Do not put them in a trash can. If you do not have a sharps container, call your pharmacist or healthcare provider to get one. Talk to your pediatrician regarding the use of this medicine in children. Special care may be needed. Overdosage: If you think you have taken too much of this medicine contact a poison control center or emergency room at once. NOTE: This medicine is only for you. Do not share this medicine with others. What if I miss a dose? If you miss a dose, take it as soon as you can. If it is almost time for your next dose, take only  that dose. Do not take double or extra doses. What may interact with this medication? bromocriptine certain medicines for blood pressure, heart disease, irregular heartbeat cyclosporine diuretics medicines for diabetes, including insulin quinidine This list may not describe all possible interactions. Give your health care provider a list of all the medicines, herbs, non-prescription drugs, or dietary supplements you use. Also tell them if you smoke, drink alcohol, or use illegal drugs. Some items may interact with your medicine. What should I watch for while using this medication? Visit your doctor or health care professional for regular checks on your progress. To help reduce irritation at the injection site, use a different site for each injection and make sure the solution is at room temperature before use. This medicine may cause decreases in blood sugar. Signs of low blood sugar include chills, cool, pale skin or cold sweats, drowsiness, extreme hunger, fast heartbeat, headache, nausea, nervousness or anxiety, shakiness, trembling, unsteadiness, tiredness, or weakness. Contact your doctor or health care professional right away if you experience any of these symptoms. This medicine may increase blood sugar. Ask your healthcare provider if changes in diet or medicines are needed if you have diabetes. This medicine may cause a decrease in vitamin B12. You should make sure that you get enough vitamin B12 while you are taking this medicine. Discuss the foods you eat and the vitamins you take with your health care professional. What side effects may I notice from receiving this medication? Side effects that you should report to your doctor or health care professional as soon as   possible: allergic reactions like skin rash, itching or hives, swelling of the face, lips, or tongue fast, slow, or irregular heartbeat right upper belly pain severe stomach pain signs and symptoms of high blood sugar such  as being more thirsty or hungry or having to urinate more than normal. You may also feel very tired or have blurry vision. signs and symptoms of low blood sugar such as feeling anxious; confusion; dizziness; increased hunger; unusually weak or tired; increased sweating; shakiness; cold, clammy skin; irritable; headache; blurred vision; fast heartbeat; loss of consciousness unusually weak or tired Side effects that usually do not require medical attention (report to your doctor or health care professional if they continue or are bothersome): diarrhea dizziness gas headache nausea, vomiting pain, redness, or irritation at site where injected upset stomach This list may not describe all possible side effects. Call your doctor for medical advice about side effects. You may report side effects to FDA at 1-800-FDA-1088. Where should I keep my medication? Keep out of the reach of children. Store in a refrigerator between 2 and 8 degrees C (36 and 46 degrees F). Protect from light. Allow to come to room temperature naturally. Do not use artificial heat. If protected from light, the injection may be stored at room temperature between 20 and 30 degrees C (70 and 86 degrees F) for 14 days. After the initial use, throw away any unused portion of a multiple dose vial after 14 days. Throw away unused portions of the ampules after use. NOTE: This sheet is a summary. It may not cover all possible information. If you have questions about this medicine, talk to your doctor, pharmacist, or health care provider.  2022 [employer]vier/Gold Standard (2018-11-11 00:00:00)  

## 2021-08-01 LAB — CHROMOGRANIN A: Chromogranin A (ng/mL): 42.7 ng/mL (ref 0.0–101.8)
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
## 2021-08-28 ENCOUNTER — Other Ambulatory Visit: Payer: Medicare HMO

## 2021-08-28 ENCOUNTER — Inpatient Hospital Stay: Payer: Medicare HMO

## 2021-08-28 ENCOUNTER — Ambulatory Visit: Payer: Medicare HMO | Admitting: Oncology

## 2021-08-28 DIAGNOSIS — C7A012 Malignant carcinoid tumor of the ileum: Secondary | ICD-10-CM

## 2021-08-29 DIAGNOSIS — R0602 Shortness of breath: Secondary | ICD-10-CM

## 2021-08-29 DIAGNOSIS — C7A012 Malignant carcinoid tumor of the ileum: Secondary | ICD-10-CM

## 2021-08-30 ENCOUNTER — Encounter: Payer: Self-pay | Admitting: Oncology

## 2021-08-30 DIAGNOSIS — C7A012 Malignant carcinoid tumor of the ileum: Secondary | ICD-10-CM | POA: Diagnosis not present

## 2021-09-06 ENCOUNTER — Observation Stay (HOSPITAL_COMMUNITY)
Admission: EM | Admit: 2021-09-06 | Discharge: 2021-09-07 | Disposition: A | Discharge status: Home / Self Care | Payer: Medicare HMO | Attending: Internal Medicine | Admitting: Internal Medicine

## 2021-09-06 ENCOUNTER — Other Ambulatory Visit: Payer: Self-pay

## 2021-09-06 ENCOUNTER — Encounter (HOSPITAL_COMMUNITY): Payer: Self-pay | Admitting: Emergency Medicine

## 2021-09-06 ENCOUNTER — Emergency Department (HOSPITAL_COMMUNITY): Payer: Medicare HMO

## 2021-09-06 ENCOUNTER — Observation Stay (HOSPITAL_COMMUNITY): Payer: Medicare HMO

## 2021-09-06 DIAGNOSIS — Z87891 Personal history of nicotine dependence: Secondary | ICD-10-CM | POA: Insufficient documentation

## 2021-09-06 DIAGNOSIS — I2693 Single subsegmental pulmonary embolism without acute cor pulmonale: Secondary | ICD-10-CM | POA: Diagnosis not present

## 2021-09-06 DIAGNOSIS — D72829 Elevated white blood cell count, unspecified: Secondary | ICD-10-CM | POA: Insufficient documentation

## 2021-09-06 DIAGNOSIS — Z86018 Personal history of other benign neoplasm: Secondary | ICD-10-CM | POA: Diagnosis not present

## 2021-09-06 DIAGNOSIS — C7A012 Malignant carcinoid tumor of the ileum: Secondary | ICD-10-CM | POA: Insufficient documentation

## 2021-09-06 DIAGNOSIS — G8929 Other chronic pain: Secondary | ICD-10-CM | POA: Insufficient documentation

## 2021-09-06 DIAGNOSIS — R110 Nausea: Secondary | ICD-10-CM | POA: Diagnosis not present

## 2021-09-06 DIAGNOSIS — M549 Dorsalgia, unspecified: Secondary | ICD-10-CM | POA: Diagnosis not present

## 2021-09-06 DIAGNOSIS — R0781 Pleurodynia: Secondary | ICD-10-CM | POA: Diagnosis present

## 2021-09-06 DIAGNOSIS — R109 Unspecified abdominal pain: Secondary | ICD-10-CM | POA: Insufficient documentation

## 2021-09-06 DIAGNOSIS — J9601 Acute respiratory failure with hypoxia: Secondary | ICD-10-CM

## 2021-09-06 DIAGNOSIS — R262 Difficulty in walking, not elsewhere classified: Secondary | ICD-10-CM | POA: Diagnosis not present

## 2021-09-06 DIAGNOSIS — I10 Essential (primary) hypertension: Secondary | ICD-10-CM | POA: Diagnosis not present

## 2021-09-06 DIAGNOSIS — Z79899 Other long term (current) drug therapy: Secondary | ICD-10-CM | POA: Insufficient documentation

## 2021-09-06 DIAGNOSIS — Z7901 Long term (current) use of anticoagulants: Secondary | ICD-10-CM | POA: Insufficient documentation

## 2021-09-06 DIAGNOSIS — E785 Hyperlipidemia, unspecified: Secondary | ICD-10-CM | POA: Diagnosis present

## 2021-09-06 DIAGNOSIS — D649 Anemia, unspecified: Secondary | ICD-10-CM

## 2021-09-06 DIAGNOSIS — F419 Anxiety disorder, unspecified: Secondary | ICD-10-CM | POA: Diagnosis present

## 2021-09-06 DIAGNOSIS — E876 Hypokalemia: Secondary | ICD-10-CM

## 2021-09-06 DIAGNOSIS — I2699 Other pulmonary embolism without acute cor pulmonale: Secondary | ICD-10-CM | POA: Diagnosis present

## 2021-09-06 DIAGNOSIS — G629 Polyneuropathy, unspecified: Secondary | ICD-10-CM

## 2021-09-06 HISTORY — DX: Anemia, unspecified: D64.9

## 2021-09-06 HISTORY — DX: Acute respiratory failure with hypoxia: J96.01

## 2021-09-06 HISTORY — DX: Hypokalemia: E87.6

## 2021-09-06 HISTORY — DX: Elevated white blood cell count, unspecified: D72.829

## 2021-09-06 HISTORY — DX: Other chronic pain: G89.29

## 2021-09-06 HISTORY — DX: Other pulmonary embolism without acute cor pulmonale: I26.99

## 2021-09-06 LAB — CBC
HCT: 36.5 % (ref 36.0–46.0)
Hemoglobin: 11.8 g/dL — ABNORMAL LOW (ref 12.0–15.0)
MCH: 31.4 pg (ref 26.0–34.0)
MCHC: 32.3 g/dL (ref 30.0–36.0)
MCV: 97.1 fL (ref 80.0–100.0)
Platelets: 435 10*3/uL — ABNORMAL HIGH (ref 150–400)
RBC: 3.76 MIL/uL — ABNORMAL LOW (ref 3.87–5.11)
RDW: 13.7 % (ref 11.5–15.5)
WBC: 15 10*3/uL — ABNORMAL HIGH (ref 4.0–10.5)
nRBC: 0 % (ref 0.0–0.2)

## 2021-09-06 LAB — URINALYSIS, ROUTINE W REFLEX MICROSCOPIC
Bilirubin Urine: NEGATIVE
Glucose, UA: NEGATIVE mg/dL
Hgb urine dipstick: NEGATIVE
Ketones, ur: 20 mg/dL — AB
Leukocytes,Ua: NEGATIVE
Nitrite: NEGATIVE
Protein, ur: NEGATIVE mg/dL
Specific Gravity, Urine: 1.018 (ref 1.005–1.030)
pH: 6 (ref 5.0–8.0)

## 2021-09-06 LAB — BASIC METABOLIC PANEL
Anion gap: 11 (ref 5–15)
BUN: 6 mg/dL — ABNORMAL LOW (ref 8–23)
CO2: 30 mmol/L (ref 22–32)
Calcium: 9.3 mg/dL (ref 8.9–10.3)
Chloride: 96 mmol/L — ABNORMAL LOW (ref 98–111)
Creatinine, Ser: 0.72 mg/dL (ref 0.44–1.00)
GFR, Estimated: >60 mL/min (ref >60)
Glucose, Bld: 115 mg/dL — ABNORMAL HIGH (ref 70–99)
Potassium: 3.2 mmol/L — ABNORMAL LOW (ref 3.5–5.1)
Sodium: 137 mmol/L (ref 135–145)

## 2021-09-06 LAB — HEPARIN LEVEL (UNFRACTIONATED)
Heparin Unfractionated: <0.1 IU/mL — ABNORMAL LOW (ref 0.30–0.70)
Heparin Unfractionated: 0.23 IU/mL — ABNORMAL LOW (ref 0.30–0.70)

## 2021-09-06 LAB — MAGNESIUM: Magnesium: 1.8 mg/dL (ref 1.7–2.4)

## 2021-09-06 LAB — HEPATIC FUNCTION PANEL
ALT: 26 U/L (ref 0–44)
AST: 18 U/L (ref 15–41)
Albumin: 2.9 g/dL — ABNORMAL LOW (ref 3.5–5.0)
Alkaline Phosphatase: 93 U/L (ref 38–126)
Bilirubin, Direct: 0.1 mg/dL (ref 0.0–0.2)
Indirect Bilirubin: 0.9 mg/dL (ref 0.3–0.9)
Total Bilirubin: 1 mg/dL (ref 0.3–1.2)
Total Protein: 7.9 g/dL (ref 6.5–8.1)

## 2021-09-06 LAB — TSH: TSH: 1.6 u[IU]/mL (ref 0.350–4.500)

## 2021-09-06 LAB — FERRITIN: Ferritin: 873 ng/mL — ABNORMAL HIGH (ref 11–307)

## 2021-09-06 LAB — IRON AND TIBC
Iron: 21 ug/dL — ABNORMAL LOW (ref 28–170)
Saturation Ratios: 9 % — ABNORMAL LOW (ref 10.4–31.8)
TIBC: 244 ug/dL — ABNORMAL LOW (ref 250–450)
UIBC: 223 ug/dL

## 2021-09-06 LAB — TROPONIN I (HIGH SENSITIVITY)
Troponin I (High Sensitivity): 15 ng/L (ref <18)
Troponin I (High Sensitivity): 13 ng/L (ref <18)

## 2021-09-06 LAB — HIV ANTIBODY (ROUTINE TESTING W REFLEX): HIV Screen 4th Generation wRfx: NONREACTIVE

## 2021-09-06 LAB — VITAMIN B12: Vitamin B-12: 1942 pg/mL — ABNORMAL HIGH (ref 180–914)

## 2021-09-06 LAB — LACTIC ACID, PLASMA: Lactic Acid, Venous: 1.2 mmol/L (ref 0.5–1.9)

## 2021-09-06 MED ORDER — GABAPENTIN 300 MG PO CAPS
300.0000 mg | ORAL_CAPSULE | Freq: Every day | ORAL | Status: DC
  Administered 2021-09-06: 300 mg via ORAL
  Filled 2021-09-06: qty 1

## 2021-09-06 MED ORDER — ONDANSETRON 4 MG PO TBDP
4.0000 mg | ORAL_TABLET | Freq: Two times a day (BID) | ORAL | Status: DC | PRN
  Administered 2021-09-06: 4 mg via ORAL
  Filled 2021-09-06: qty 1

## 2021-09-06 MED ORDER — FAMOTIDINE 20 MG PO TABS
20.0000 mg | ORAL_TABLET | Freq: Every day | ORAL | Status: DC
  Administered 2021-09-06: 20 mg via ORAL
  Filled 2021-09-06: qty 1

## 2021-09-06 MED ORDER — HEPARIN (PORCINE) 25000 UT/250ML-% IV SOLN
1450.0000 [IU]/h | INTRAVENOUS | Status: DC
  Administered 2021-09-06: 1200 [IU]/h via INTRAVENOUS
  Administered 2021-09-07: 1450 [IU]/h via INTRAVENOUS
  Filled 2021-09-06 (×2): qty 250

## 2021-09-06 MED ORDER — HEPARIN BOLUS VIA INFUSION
5000.0000 [IU] | Freq: Once | INTRAVENOUS | Status: AC
  Administered 2021-09-06: 5000 [IU] via INTRAVENOUS
  Filled 2021-09-06: qty 5000

## 2021-09-06 MED ORDER — DICYCLOMINE HCL 20 MG PO TABS
20.0000 mg | ORAL_TABLET | Freq: Four times a day (QID) | ORAL | Status: DC | PRN
  Filled 2021-09-06: qty 1

## 2021-09-06 MED ORDER — FERROUS SULFATE 325 (65 FE) MG PO TABS
325.0000 mg | ORAL_TABLET | Freq: Every day | ORAL | Status: DC
  Administered 2021-09-06 – 2021-09-07 (×2): 325 mg via ORAL
  Filled 2021-09-06 (×2): qty 1

## 2021-09-06 MED ORDER — LACTATED RINGERS IV BOLUS
1000.0000 mL | Freq: Once | INTRAVENOUS | Status: AC
Start: 2021-09-06 — End: 2021-09-06
  Administered 2021-09-06: 1000 mL via INTRAVENOUS

## 2021-09-06 MED ORDER — ONDANSETRON HCL 4 MG/2ML IJ SOLN
4.0000 mg | Freq: Once | INTRAMUSCULAR | Status: AC
  Administered 2021-09-06: 4 mg via INTRAVENOUS
  Filled 2021-09-06: qty 2

## 2021-09-06 MED ORDER — SODIUM CHLORIDE 0.9 % IV SOLN: 250.0000 mL | INTRAVENOUS | Status: DC | PRN

## 2021-09-06 MED ORDER — ENSURE ENLIVE PO LIQD
237.0000 mL | Freq: Three times a day (TID) | ORAL | Status: DC
  Administered 2021-09-06 – 2021-09-07 (×2): 237 mL via ORAL
  Filled 2021-09-06: qty 237

## 2021-09-06 MED ORDER — CEFDINIR 300 MG PO CAPS
300.0000 mg | ORAL_CAPSULE | Freq: Two times a day (BID) | ORAL | Status: DC
  Administered 2021-09-06 – 2021-09-07 (×2): 300 mg via ORAL
  Filled 2021-09-06 (×2): qty 1

## 2021-09-06 MED ORDER — SODIUM CHLORIDE 0.9% FLUSH
3.0000 mL | Freq: Two times a day (BID) | INTRAVENOUS | Status: DC
  Administered 2021-09-06 – 2021-09-07 (×3): 3 mL via INTRAVENOUS

## 2021-09-06 MED ORDER — SODIUM CHLORIDE 0.9 % IV SOLN: INTRAVENOUS | Status: DC

## 2021-09-06 MED ORDER — ACETAMINOPHEN 325 MG PO TABS
650.0000 mg | ORAL_TABLET | Freq: Four times a day (QID) | ORAL | Status: DC | PRN
  Administered 2021-09-07: 650 mg via ORAL
  Filled 2021-09-06: qty 2

## 2021-09-06 MED ORDER — SENNOSIDES-DOCUSATE SODIUM 8.6-50 MG PO TABS: 1.0000 | ORAL_TABLET | Freq: Every evening | ORAL | Status: DC | PRN

## 2021-09-06 MED ORDER — IOHEXOL 350 MG/ML SOLN
100.0000 mL | Freq: Once | INTRAVENOUS | Status: AC | PRN
  Administered 2021-09-06: 100 mL via INTRAVENOUS

## 2021-09-06 MED ORDER — DULOXETINE HCL 60 MG PO CPEP
60.0000 mg | ORAL_CAPSULE | Freq: Every day | ORAL | Status: DC
  Administered 2021-09-06 – 2021-09-07 (×2): 60 mg via ORAL
  Filled 2021-09-06 (×2): qty 1

## 2021-09-06 MED ORDER — PANTOPRAZOLE SODIUM 40 MG IV SOLR
40.0000 mg | INTRAVENOUS | Status: DC
  Administered 2021-09-06: 40 mg via INTRAVENOUS
  Filled 2021-09-06: qty 10

## 2021-09-06 MED ORDER — MECLIZINE HCL 25 MG PO TABS
25.0000 mg | ORAL_TABLET | Freq: Four times a day (QID) | ORAL | Status: DC | PRN
  Administered 2021-09-06: 25 mg via ORAL
  Filled 2021-09-06 (×2): qty 1

## 2021-09-06 MED ORDER — HYDROCODONE-ACETAMINOPHEN 7.5-325 MG PO TABS
1.0000 | ORAL_TABLET | Freq: Four times a day (QID) | ORAL | Status: DC | PRN
  Administered 2021-09-06: 1 via ORAL
  Filled 2021-09-06: qty 1

## 2021-09-06 MED ORDER — MORPHINE SULFATE (PF) 4 MG/ML IV SOLN
4.0000 mg | Freq: Once | INTRAVENOUS | Status: AC
  Administered 2021-09-06: 4 mg via INTRAVENOUS
  Filled 2021-09-06: qty 1

## 2021-09-06 MED ORDER — SODIUM CHLORIDE 0.9% FLUSH: 3.0000 mL | INTRAVENOUS | Status: DC | PRN

## 2021-09-06 MED ORDER — MIRTAZAPINE 15 MG PO TABS
15.0000 mg | ORAL_TABLET | Freq: Every day | ORAL | Status: DC
  Administered 2021-09-06: 15 mg via ORAL
  Filled 2021-09-06: qty 1

## 2021-09-06 MED ORDER — ACETAMINOPHEN 650 MG RE SUPP: 650.0000 mg | Freq: Four times a day (QID) | RECTAL | Status: DC | PRN

## 2021-09-06 MED ORDER — POTASSIUM CHLORIDE CRYS ER 20 MEQ PO TBCR
40.0000 meq | EXTENDED_RELEASE_TABLET | Freq: Once | ORAL | Status: AC
  Administered 2021-09-06: 40 meq via ORAL
  Filled 2021-09-06: qty 2

## 2021-09-06 NOTE — ED Provider Notes (Signed)
?[facility] ?Provider Note ? ? ?CSN: [id number] ?Arrival date & time: 09/06/21  0536 ? ?  ? ?History ? ?Chief Complaint  ?Patient presents with  ? Back Pain  ? Dysuria  ? ? ?Kristi Romero is a 66 y.o. 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  Presents to the emergency department with a chief complaint of back pain and pain with urination. ? ?Patient reports that her back pain has been present over the last 4 months.  Pain has been worse since being discharged from the hospital on Monday.  Per RN note patient was admitted through the weekend for sepsis.  Patient reports that the pain starts in her shoulder blades and radiates down her back to her feet.  Pain is worse with touch and movement.  Patient reports that due to her pain she is having difficulty moving her legs.  Patient has had difficulty with ambulation at baseline due to neuropathy however she states that she can normally lift her legs but cannot do that at this time due to her pain.  Patient reports that she suffered a full last Friday which brought her to the hospital.  No falls since then. ? ?Patient reports that she has had burning sensation with urination for "months."  Patient states that this has been worse since being discharged from the hospital as well.  Patient states that she has urine incontinence at baseline.  Patient has not noticed any hematuria. ? ?Patient also complains of shortness of breath.  States that when she takes a deep breath that she has chest pain.  Patient attributes her shortness of breath to this feeling. ? ?Patient endorses generalized abdominal pain.  States that this is baseline and unchanged at present.  Patient also reports that she had decreased p.o. intake  over the last 7 to 8 months due to her abdominal pain.  Patient reports constipation at baseline.  States that last bowel movement was over the weekend. ? ?Denies any fever, chills, nausea, vomiting, blood in stool, melena.   ? ? ?Back Pain ?Associated symptoms: abdominal pain, chest pain, dysuria and numbness   ?Associated symptoms: no fever, no headaches and no weakness   ?Dysuria ?Associated symptoms: abdominal pain   ?Associated symptoms: no fever, no nausea, no vaginal discharge and no vomiting   ? ?  ? ?Home Medications ?Prior to Admission medications   ?Medication Sig Start Date End Date Taking? Authorizing Provider  ?baclofen (LIORESAL) 10 MG tablet Take 10 mg by mouth 2 (two) times daily. 04/11/21   [provider]  ?DULoxetine (CYMBALTA) 60 MG capsule Take 60 mg by mouth daily.    [provider]  ?famotidine (PEPCID) 20 MG tablet Take by mouth. 01/12/19   [provider]  ?gabapentin (NEURONTIN) 300 MG capsule Take by mouth at bedtime. 05/09/21   [provider]  ?meclizine (ANTIVERT) 25 MG tablet Take by mouth. 02/12/16   [provider]  ?ondansetron (ZOFRAN) 4 MG tablet  10/16/15   [provider]  ?   ? ?Allergies    ?Lisinopril, Codeine, and Hydrocodone   ? ?Review of Systems   ?Review of Systems  ?Constitutional:  Negative for chills and fever.  ?Eyes:  Negative for visual disturbance.  ?Respiratory:  Positive for shortness of breath.   ?Cardiovascular:  Positive for chest pain.  Negative for palpitations and leg swelling.  ?Gastrointestinal:  Positive for abdominal pain and constipation. Negative for abdominal distention, anal bleeding, blood in stool, diarrhea, nausea, rectal pain and vomiting.  ?Genitourinary:  Positive for dysuria. Negative for difficulty urinating, frequency, hematuria, urgency, vaginal bleeding, vaginal discharge and vaginal pain.  ?Musculoskeletal:  Positive for arthralgias, back pain and myalgias. Negative for neck pain.   ?Skin:  Negative for color change and rash.  ?Neurological:  Positive for numbness. Negative for dizziness, syncope, weakness, light-headedness and headaches.  ?Psychiatric/Behavioral:  Negative for confusion.   ? ?Physical Exam ?Updated Vital Signs ?BP 119/71 (BP Location: Left Arm)   Pulse 84   Temp 98 ?F (36.7 ?C) (Oral)   Resp 17   Wt 70.3 kg   LMP  (LMP Unknown)   SpO2 (!) 84%   BMI 26.61 kg/m?  ?Physical Exam ?Vitals and nursing note reviewed.  ?Constitutional:   ?   General: She is not in acute distress. ?   Appearance: She is not ill-appearing, toxic-appearing or diaphoretic.  ?HENT:  ?   Head: Normocephalic and atraumatic.  ?Eyes:  ?   General: No scleral icterus.    ?   Right eye: No discharge.     ?   Left eye: No discharge.  ?Cardiovascular:  ?   Rate and Rhythm: Normal rate.  ?   Pulses:     ?     Radial pulses are 2+ on the right side and 2+ on the left side.  ?Pulmonary:  ?   Effort: Pulmonary effort is normal. No tachypnea, bradypnea or respiratory distress.  ?   Breath sounds: Normal breath sounds. No stridor.  ?Abdominal:  ?   General: Abdomen is flat. A surgical scar is present. Bowel sounds are normal. There is no distension. There are no signs of injury.  ?   Palpations: Abdomen is soft. There is no mass or pulsatile mass.  ?   Tenderness: There is generalized abdominal tenderness. There is no guarding or rebound.  ?   Hernia: There is no hernia in the umbilical area or ventral area.  ?   Comments: Minimal diffuse abdominal tenderness  ?Musculoskeletal:  ?   Cervical back: Tenderness and bony tenderness present. No swelling, edema, deformity, erythema, signs of trauma, lacerations, rigidity, spasms, torticollis or crepitus. No pain with movement. Normal range of motion.  ?   Thoracic back: Tenderness and bony tenderness present. No swelling, edema, deformity, signs of trauma, lacerations or spasms.  ?   Lumbar back: Tenderness and bony tenderness present. No swelling, edema, deformity,  signs of trauma, lacerations or spasms.  ?   Comments: Diffuse tenderness throughout patient's entire back.  Patient does have midline tenderness to cervical, thoracic, or lumbar spine.  No step-off or deformity noted to cervical, thoracic, or lumbar spine.  No signs of injury, ecchymosis, contusion.  ?Skin: ?   General: Skin is warm and dry.  ?Neurological:  ?   General: No focal deficit present.  ?   Mental Status: She is alert.  ?   GCS: GCS eye subscore is 4. GCS verbal subscore is 5. GCS motor subscore is 6.  ?   Comments: Grip strength equal, +5 strength to bilateral upper extremities.  +5 strength to dorsiflexion and plantarflexion bilaterally.  Patient able to move both legs without difficulty.  Unable to hold and lift legs against gravity due to complaints of back pain.  Sensation to light touch grossly intact to bilateral upper and lower  extremities  ?Psychiatric:     ?   Behavior: Behavior is cooperative.  ? ? ?ED Results / Procedures / Treatments   ?Labs ?(all labs ordered are listed, but only abnormal results are displayed) ?Labs Reviewed  ?URINALYSIS, ROUTINE W REFLEX MICROSCOPIC - Abnormal; Notable for the following components:  ?    Result Value  ? Color, Urine AMBER (*)   ? Ketones, ur 20 (*)   ? All other components within normal limits  ?BASIC METABOLIC PANEL - Abnormal; Notable for the following components:  ? Potassium 3.2 (*)   ? Chloride 96 (*)   ? Glucose, Bld 115 (*)   ? BUN 6 (*)   ? All other components within normal limits  ?CBC - Abnormal; Notable for the following components:  ? WBC 15.0 (*)   ? RBC 3.76 (*)   ? Hemoglobin 11.8 (*)   ? Platelets 435 (*)   ? All other components within normal limits  ?HEPATIC FUNCTION PANEL - Abnormal; Notable for the following components:  ? Albumin 2.9 (*)   ? All other components within normal limits  ?URINE CULTURE  ?LACTIC ACID, PLASMA  ?HEPARIN LEVEL (UNFRACTIONATED)  ?HEPARIN LEVEL (UNFRACTIONATED)  ?TROPONIN I (HIGH SENSITIVITY)  ?TROPONIN I  (HIGH SENSITIVITY)  ? ? ?EKG ?None ? ?Radiology ?DG Chest Portable 1 View ? ?Result Date: 09/06/2021 ?CLINICAL DATA:  Chest pain, shortness of breath. EXAM: PORTABLE CHEST 1 VIEW COMPARISON:  Aug 31, 2021. FINDINGS: Stabl

## 2021-09-06 NOTE — Assessment & Plan Note (Addendum)
Patient with drop in hgb over past month.  ?13.6>11.8, denies any bleeding, very poor PO intake/diet  ?Elevated platelets, ? IDA.  ?Check iron studies and trend   ?Check fecal occult  ?

## 2021-09-06 NOTE — Assessment & Plan Note (Signed)
History of neuroendocrine/carcinoid tumor of the ascending colon, diagnosed in October 2016, treated with surgical resection ?-on monthly octreotide  ?

## 2021-09-06 NOTE — Assessment & Plan Note (Addendum)
66 year old female presenting with pleuritic chest pain, acute respiratory failure with hypoxia to 88% on room air and shortness of breath found to have right lower lobe PE ?-obs to med tele ?-likely provoked, just in hospital for pneumonia x 4 days and sedentary lifestyle, hx of carcinoid tumor  ?-continue heparin overnight ?-normal troponin/bnp and no evidence of heart strain ?-check bilateral DVT dopplers ?-continue to wean oxygen as tolerated  ?

## 2021-09-06 NOTE — H&P (Signed)
?History and Physical  ? ? ?PatientNayana Romero FBP:[id number] DOB: 01/19/1956 ?DOA: 09/06/2021 ?DOS: the patient was seen and examined on 09/06/2021 ?PCP: Barnetta Chapel, NP  ?Patient coming from: Home - lives with her cousin. Uses WC ? ? ?Chief Complaint: shortness of breath/chest pain/pleuritic pain ? ?HPI: Kristi Romero is a 65 y.o. female with medical history significant of anxiety and depression, chronic back pain, HLD, carcinoid tumor of the ileum diagnosed in 2016 s/p resection on monthly octreotide, chronic abdominal pain, nausea and neuropathy who presented to ED with complaints of shortness of breath, pleuritic chest pain and pain all over since being discharged from a hospital in [place] on Monday. She isn't sure why she was there. She also complains of diffuse back pain. She states today she had pain so much so that she couldn't walk and was short of breath with walking so came to the ED. Her chest hurts to press on it and when she raises her arms she has pain around her chest that radiates to her back.  ? ?She was admitted to  last Friday for vomiting, bowel incontinence and weakness. She was treated for pneumonia and given omnicef to complete. Discharged on Monday 09/02/21.  ? ?Denies any fever/chills, no vision changes, +headaches, no palpitations, no cough, chronic abdominal pain, N/V/D, dysuria or leg swelling. Has had chronic issue of nausea with eating and subsequent decreased PO intake x 6 months.  ? ?She has chronic back pain, but states her lower back hurts much worse than normal. She has new leg weakness and can't feel around her bottom and has urinary incontinence.  ? ?She does not smoke or drink alcohol. She is not on any OCP. No known history of clotting disorder in family/self. No recent covid vaccines. No prolonged traveling. Was recently in hospital in [place] from Friday to Monday.  ? ?ER Course:  vitals: afebrile, bp: 118/79, HR 80, RR: 18, oxygen: 99% 2L [place].  Oxygen 88% on RA,  witnessed by PA, placed on 2L oxygen.  ?Pertinent labs: wbc: 15, hgb: 11.8, potassium: 3.2,  ?CXR: left basilar subsegmental atelectasis or scarring ?CTA chest: PE in RLL no evidence of heart strain. Circumferential bladder wall thickening, no other acute finding in pelvis.  ?CT spine: degenerative changes throughout the thoracolumbar spine with disc space narrowing and degenerative endplate changes, most advanced at L5-S1.  ?In ED: started on heparin drip. Given morphine and 1L IVF. Urine cultured.  ? ? ?Review of Systems: As mentioned in the history of present illness. All other systems reviewed and are negative. ?Past Medical History:  ?Diagnosis Date  ? Abdominal pain, epigastric   ? Accident occurring in home 05/07/2016  ? Adrenal adenoma 03/16/2018  ? Anxiety and depression 03/16/2018  ? Cervicogenic headache 10/14/2016  ? Chronic back pain   ? Constipation 04/08/2021  ? Dizziness 10/14/2016  ? Elevated lipase 01/13/2017  ? Formatting of this note might be different from the original. May 2018 - 332  ? Fall from other slipping, tripping, or stumbling 05/07/2016  ? Hiatal hernia   ? Hypercholesteremia   ? Hyperlipidemia   ? Hypertension   ? Hypoglycemia 03/16/2018  ? Intractable nausea and vomiting 03/16/2018  ? Malignant carcinoid tumor of ileum ([place]) 04/28/2014  ? Malnutrition ([place]) 10/14/2016  ? Nausea & vomiting 03/17/2018  ? Neuropathy 10/28/2017  ? 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 Numbness   ? Numbness and tingling 10/14/2016  ? Sleeping difficulty 04/18/2016  ? 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  Thyroid nodule 07/22/2016  ? Urinary urgency 05/07/2016  ? Ventral hernia 05/07/2016  ? Vitamin D deficiency 01/13/2017  ? ?Past Surgical History:  ?Procedure Laterality Date  ? BIOPSY  03/16/2018  ? Procedure: BIOPSY;  Surgeon: Ladene Artist, MD;  Location: [facility] ENDOSCOPY;  Service: Endoscopy;;  ? COLONOSCOPY  2018  ? said they removed a mass. Dr Harl Favor [place]   ? COLONOSCOPY   09/05/2020  ? Dr Orlena Sheldon Benign neoplasm of transverse colon. Benign neoplasm of descending colon.  ? ESOPHAGOGASTRODUODENOSCOPY  02/2018  ? ESOPHAGOGASTRODUODENOSCOPY (EGD) WITH PROPOFOL N/A 03/16/2018  ? Procedure: ESOPHAGOGASTRODUODENOSCOPY (EGD) WITH PROPOFOL;  Surgeon: Ladene Artist, MD;  Location: [facility] ENDOSCOPY;  Service: Endoscopy;  Laterality: N/A;  ? NECK SURGERY    ? PARTIAL HYSTERECTOMY    ? TOE SURGERY Bilateral   ? ?Social History:  reports that she has quit smoking. She has never used smokeless tobacco. She reports current drug use. Drug: Marijuana. She reports that she does not drink alcohol. ? ?Allergies  ?Allergen Reactions  ? Zestril [Lisinopril] Anaphylaxis and Swelling  ? Codeine Nausea Only and Nausea And Vomiting  ?  Other reaction(s): GI Upset (intolerance)  ? ? ?Family History  ?Problem Relation Age of Onset  ? Lung cancer Father   ? Heart disease Father   ? Hypertension Father   ? Heart disease Mother   ? Hypertension Mother   ? Cancer Mother   ? ? ?Prior to Admission medications   ?Medication Sig Start Date End Date Taking? Authorizing Provider  ?baclofen (LIORESAL) 10 MG tablet Take 10 mg by mouth 2 (two) times daily. 04/11/21   [provider]  ?DULoxetine (CYMBALTA) 60 MG capsule Take 60 mg by mouth daily.    [provider]  ?famotidine (PEPCID) 20 MG tablet Take by mouth. 01/12/19   [provider]  ?gabapentin (NEURONTIN) 300 MG capsule Take by mouth at bedtime. 05/09/21   [provider]  ?meclizine (ANTIVERT) 25 MG tablet Take by mouth. 02/12/16   [provider]  ?ondansetron (ZOFRAN) 4 MG tablet  10/16/15   [provider]  ? ? ?Physical Exam: ?Vitals:  ? 09/06/21 1600 09/06/21 1630 09/06/21 1700 09/06/21 1835  ?BP: 129/66 128/62 132/63 129/60  ?Pulse: 76 77 77 79  ?Resp: 18 17 (!) 22 12  ?Temp:    98.3 ?F (36.8 ?C)  ?TempSrc:    Oral  ?SpO2: 100% 97% 98% 100%  ?Weight:      ?Height:      ? ?General:  Appears calm and comfortable  and is in NAD ?Eyes:  PERRL, EOMI, normal lids, iris ?ENT:  grossly normal hearing, lips & tongue, mmm; appropriate dentition ?Neck:  no LAD, masses or thyromegaly; no carotid bruits ?Cardiovascular:  RRR, no m/r/g. No LE edema.  ?Respiratory:   CTA bilaterally with no wheezes/rales/rhonchi.  Normal respiratory effort. ?Abdomen:  soft, generalized TTP, ND, NABS ?Back:   normal alignment, no CVAT ?Skin:  no rash or induration seen on limited exam ?Musculoskeletal:  grossly normal tone BUE, weak on BLE, good ROM, no bony abnormality. TTP over anterior chest wall  ?Lower extremity:  No LE edema.  Limited foot exam with no ulcerations.  2+ distal pulses. ?Psychiatric:  grossly normal mood and affect, speech fluent and appropriate, AOx3 ?Neurologic:  CN 2-12 grossly intact, moves all extremities in coordinated fashion, sensation intact ? ? ?Radiological Exams on Admission: ?Independently reviewed - see discussion in A/P where applicable ? ?CT Angio Chest PE  W and/or Wo Contrast ? ?Result Date: 09/06/2021 ?CLINICAL DATA:  Pulmonary embolism suspected, abdominal pain EXAM: CT ANGIOGRAPHY CHEST CT ABDOMEN AND PELVIS WITH CONTRAST TECHNIQUE: Multidetector CT imaging of the chest was performed using the standard protocol during bolus administration of intravenous contrast. Multiplanar CT image reconstructions and MIPs were obtained to evaluate the vascular anatomy. Multidetector CT imaging of the abdomen and pelvis was performed using the standard protocol during bolus administration of intravenous contrast. RADIATION DOSE REDUCTION: This exam was performed according to the departmental dose-optimization program which includes automated exposure control, adjustment of the mA and/or kV according to patient size and/or use of iterative reconstruction technique. CONTRAST:  160m OMNIPAQUE IOHEXOL 350 MG/ML SOLN COMPARISON:  Same-day chest radiograph, CTA chest 08/29/2021, CT abdomen/pelvis 05/23/2021 and 08/29/2021 FINDINGS: CTA  CHEST FINDINGS Cardiovascular: There is opacification of the pulmonary arteries to the segmental level. There is nonocclusive filling defect in a right lower lobe consistent with pulmonary embolism (4-1

## 2021-09-06 NOTE — Assessment & Plan Note (Signed)
Poor nutritional intake, no exogenous losses.  ?Check magnesium ?Replete orally  ?Trend in AM  ?

## 2021-09-06 NOTE — Assessment & Plan Note (Signed)
Stable, continue cymbalta  ?

## 2021-09-06 NOTE — Assessment & Plan Note (Addendum)
At baseline with >6 month history of pain and nausea with food with poor PO intake. Mainly drinks ensure. Follows with Dr. Juanetta Snow ?-CT abdomen no acute finding ?-continue ensure/nutrition consult ?-continue zofran prn, on pepcid doesn't take ?-supposed to be on carafate, but makes her sick ?-trial protonix  ? ?

## 2021-09-06 NOTE — Progress Notes (Signed)
Patient has now arrived to the 5W17 room. Telemetry monitoring set up with dual cosign. Heparin drip continued. No complaints of pain have been made. VSS.  ?

## 2021-09-06 NOTE — Assessment & Plan Note (Addendum)
Reactive vs. Infectious ?Just hospitalized for pneumonia and on antibiotics ?No indication of fever/sepsis at this time ?CXR with no acute opacity/consolidation  ?Continue oral antibiotics and trend  ?

## 2021-09-06 NOTE — Assessment & Plan Note (Signed)
Chronic, continue gabapentin  ?

## 2021-09-06 NOTE — Progress Notes (Signed)
ANTICOAGULATION CONSULT NOTE - Initial Consult ? ?Pharmacy Consult for Heparin infusion ?Indication: pulmonary embolus ? ?Allergies  ?Allergen Reactions  ? Lisinopril Swelling and Anaphylaxis  ?  Other reaction(s): Other (See Comments)  ? Codeine Nausea Only and Nausea And Vomiting  ?  Other reaction(s): GI Upset (intolerance)  ? Hydrocodone   ?  N/V  ? ? ?Patient Measurements: ?Height: '5\' 4"'$  (162.6 cm) ?Weight: 70.3 kg (155 lb) ?IBW/kg (Calculated) : 54.7 ?Heparin Dosing Weight: 69 kg ? ?Vital Signs: ?Temp: 98 ?F (36.7 ?C) (05/12 6599) ?Temp Source: Oral (05/12 3570) ?BP: 105/47 (05/12 1100) ?Pulse Rate: 79 (05/12 1109) ? ?Labs: ?Recent Labs  ?  09/06/21 ?0617 09/06/21 ?1779  ?HGB 11.8*  --   ?HCT 36.5  --   ?PLT 435*  --   ?CREATININE 0.72  --   ?TROPONINIHS  --  15  ? ? ?Estimated Creatinine Clearance: 66.5 mL/min (by C-G formula based on SCr of 0.72 mg/dL). ? ? ?Medical History: ?Past Medical History:  ?Diagnosis Date  ? Abdominal pain, epigastric   ? Accident occurring in home 05/07/2016  ? Adrenal adenoma 03/16/2018  ? Anxiety and depression 03/16/2018  ? Cervicogenic headache 10/14/2016  ? Chronic back pain   ? Constipation 04/08/2021  ? Dizziness 10/14/2016  ? Elevated lipase 01/13/2017  ? Formatting of this note might be different from the original. May 2018 - 332  ? Fall from other slipping, tripping, or stumbling 05/07/2016  ? Hiatal hernia   ? Hypercholesteremia   ? Hyperlipidemia   ? Hypertension   ? Hypoglycemia 03/16/2018  ? Intractable nausea and vomiting 03/16/2018  ? Malignant carcinoid tumor of ileum ([place]) 04/28/2014  ? Malnutrition ([place]) 10/14/2016  ? Nausea & vomiting 03/17/2018  ? Neuropathy 10/28/2017  ? 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 Numbness   ? Numbness and tingling 10/14/2016  ? Sleeping difficulty 04/18/2016  ? 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 Thyroid nodule 07/22/2016  ? Urinary urgency 05/07/2016  ? Ventral hernia 05/07/2016  ? Vitamin D deficiency  01/13/2017  ? ? ?Medications:  ?(Not in a hospital admission)  ? ?Assessment: ?66 yo F presented to the ED with SOB and chest pain and found to have a PE. Pharmacy consulted to dose heparin for PE. Patient was not taking anticoagulation prior to admission.  ? ?Hgb 11.8, Plt 435 ? ?Goal of Therapy:  ?Heparin level 0.3-0.7 units/ml ?Monitor platelets by anticoagulation protocol: Yes ?  ?Plan:  ?Heparin bolus 5000 units IV x 1, followed by ?Heparin infusion at 1200 units/hr ?Heparin level in 6 hours and repeat until within therapeutic range x 2 ?Daily CBC, heparin level, and monitor for s/sx of bleeding.  ?F/u plan to transition to oral therapy ? ? ?Kaleen Mask ?09/06/2021,11:31 AM ? ? ?

## 2021-09-06 NOTE — Assessment & Plan Note (Addendum)
Worse since hospital d/c with leg weakness, saddle parasthesias and urinary incontinence. Unsure if some of this is chronic.  ?Has chronic neuropathy.  ?CT scan with no acute finding ?STAT lumbar MRI to r/o cauda equina  ?Will have PT see ?norco prn  ?

## 2021-09-06 NOTE — TOC Initial Note (Signed)
Transition of Care (TOC) - Initial/Assessment Note  ? ? ?Patient Details  ?Name: Kristi Romero ?MRN: [id number] ?Date of Birth: 04-Apr-1956 ? ?Transition of Care (TOC) CM/SW Contact:    ?Verdell Carmine, RN ?Phone Number: ?09/06/2021, 4:25 PM ? ?Clinical Narrative:                 ? ?Readmission after DC Monday for sepsis. At [facility]. History of Malignancy carcinoid tumor. She developed and presented chest pain and shortness of breath positive for PE, has not been on anticoagulation therapy. Sent to pharmacy for DOAC eligibility.  ? Cm will follow for needs, recommendations,and transitions.  ?  ?Barriers to Discharge: Continued Medical Work up ? ? ?Patient Goals and CMS Choice ?  ?  ?  ? ?Expected Discharge Plan and Services ?  ?  ?Discharge Planning Services: CM Consult ?  ?  ?                ?  ?  ?  ?  ?  ?  ?  ?  ?  ?  ? ?Prior Living Arrangements/Services ?  ?  ?Patient language and need for interpreter reviewed:: Yes ?       ?Need for Family Participation in Patient Care: Yes (Comment) ?Care giver support system in place?: Yes (comment) ?  ?Criminal Activity/Legal Involvement Pertinent to Current Situation/Hospitalization: No - Comment as needed ? ?Activities of Daily Living ?  ?  ? ?Permission Sought/Granted ?  ?  ?   ?   ?   ?   ? ?Emotional Assessment ?  ?  ?  ?Orientation: : Oriented to Self, Oriented to Place, Oriented to  Time ?Alcohol / Substance Use: Not Applicable ?Psych Involvement: No (comment) ? ?Admission diagnosis:  Pulmonary embolism ([place]) [I26.99] ?Patient Active Problem List  ? Diagnosis Date Noted  ? Pulmonary embolism with acute respiratory failure with hypoxia  09/06/2021  ? Hypokalemia 09/06/2021  ? Chronic abdominal pain and nausea  09/06/2021  ? Leukocytosis 09/06/2021  ? Acute respiratory failure with hypoxia ([place]) 09/06/2021  ? Constipation 04/08/2021  ? Nausea 11/18/2019  ? Vomiting 03/17/2018  ? Intractable nausea and vomiting 03/16/2018  ? Hypoglycemia 03/16/2018  ? Adrenal  adenoma 03/16/2018  ? Anxiety and depression 03/16/2018  ? Abdominal pain, epigastric   ? Neuropathy 10/28/2017  ? Elevated lipase 01/13/2017  ? Vitamin D deficiency 01/13/2017  ? Cervicogenic headache 10/14/2016  ? Dizziness 10/14/2016  ? Malnutrition ([place]) 10/14/2016  ? Numbness and tingling 10/14/2016  ? 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 Thyroid nodule 07/22/2016  ? Accident occurring in home 05/07/2016  ? Fall from other slipping, tripping, or stumbling 05/07/2016  ? Urinary urgency 05/07/2016  ? Ventral hernia 05/07/2016  ? Sleeping difficulty 04/18/2016  ? 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  Class: History of  ? Chronic back pain   ? Hiatal hernia   ? ?PCP:  Barnetta Chapel, NP ?Pharmacy:   ?[facility] [id number] ?[place] [address] ?Phone: [phone] Fax: [phone] ? ?[place] ?[address] ?[place] [address] ?Phone: [phone] Fax: [phone] ? ? ? ? ?Social Determinants of Health (SDOH) Interventions ?  ? ?Readmission Risk Interventions ?   ? View : No data to display.  ?  ?  ?  ? ? ? ?

## 2021-09-06 NOTE — ED Notes (Signed)
Pt unable to produce urine sample in triage ?

## 2021-09-06 NOTE — ED Triage Notes (Addendum)
Pt presents from [facility] for back pain and dysuria that has been present since being discharged from the hospital Monday. Was admitted for sepsis and chest wall pain.  ?EMS v/s 132/88, 80, CBG 104, 94% RA. ?Endorses generalized body pain, 10/10 ?Denies fever, chills ?

## 2021-09-07 ENCOUNTER — Encounter (HOSPITAL_COMMUNITY): Payer: Self-pay | Admitting: Family Medicine

## 2021-09-07 ENCOUNTER — Encounter (HOSPITAL_COMMUNITY): Payer: Medicare HMO

## 2021-09-07 DIAGNOSIS — I2693 Single subsegmental pulmonary embolism without acute cor pulmonale: Secondary | ICD-10-CM | POA: Diagnosis not present

## 2021-09-07 DIAGNOSIS — J9601 Acute respiratory failure with hypoxia: Secondary | ICD-10-CM | POA: Diagnosis not present

## 2021-09-07 LAB — CBC
HCT: 33 % — ABNORMAL LOW (ref 36.0–46.0)
Hemoglobin: 10.5 g/dL — ABNORMAL LOW (ref 12.0–15.0)
MCH: 30.9 pg (ref 26.0–34.0)
MCHC: 31.8 g/dL (ref 30.0–36.0)
MCV: 97.1 fL (ref 80.0–100.0)
Platelets: 360 10*3/uL (ref 150–400)
RBC: 3.4 MIL/uL — ABNORMAL LOW (ref 3.87–5.11)
RDW: 13.4 % (ref 11.5–15.5)
WBC: 13.6 10*3/uL — ABNORMAL HIGH (ref 4.0–10.5)
nRBC: 0 % (ref 0.0–0.2)

## 2021-09-07 LAB — URINE CULTURE: Culture: NO GROWTH

## 2021-09-07 LAB — BASIC METABOLIC PANEL
Anion gap: 6 (ref 5–15)
BUN: 5 mg/dL — ABNORMAL LOW (ref 8–23)
CO2: 27 mmol/L (ref 22–32)
Calcium: 8.8 mg/dL — ABNORMAL LOW (ref 8.9–10.3)
Chloride: 102 mmol/L (ref 98–111)
Creatinine, Ser: 0.61 mg/dL (ref 0.44–1.00)
GFR, Estimated: >60 mL/min (ref >60)
Glucose, Bld: 117 mg/dL — ABNORMAL HIGH (ref 70–99)
Potassium: 4.3 mmol/L (ref 3.5–5.1)
Sodium: 135 mmol/L (ref 135–145)

## 2021-09-07 LAB — HEPARIN LEVEL (UNFRACTIONATED): Heparin Unfractionated: <0.1 IU/mL — ABNORMAL LOW (ref 0.30–0.70)

## 2021-09-07 MED ORDER — APIXABAN 5 MG PO TABS
5.0000 mg | ORAL_TABLET | Freq: Two times a day (BID) | ORAL | 0 refills | Status: AC
Start: 2021-09-14 — End: ?

## 2021-09-07 MED ORDER — APIXABAN 5 MG PO TABS
10.0000 mg | ORAL_TABLET | Freq: Two times a day (BID) | ORAL | Status: DC
  Administered 2021-09-07: 10 mg via ORAL
  Filled 2021-09-07: qty 2

## 2021-09-07 MED ORDER — PANTOPRAZOLE SODIUM 40 MG PO TBEC: 40.0000 mg | DELAYED_RELEASE_TABLET | Freq: Every day | ORAL | 0 refills | Status: DC

## 2021-09-07 MED ORDER — HEPARIN BOLUS VIA INFUSION
2000.0000 [IU] | Freq: Once | INTRAVENOUS | Status: AC
  Administered 2021-09-07: 2000 [IU] via INTRAVENOUS
  Filled 2021-09-07: qty 2000

## 2021-09-07 MED ORDER — APIXABAN 5 MG PO TABS
5.0000 mg | ORAL_TABLET | Freq: Two times a day (BID) | ORAL | Status: DC
Start: 2021-09-14 — End: 2021-09-07

## 2021-09-07 MED ORDER — APIXABAN 5 MG PO TABS: 10.0000 mg | ORAL_TABLET | Freq: Two times a day (BID) | ORAL | 0 refills | Status: DC

## 2021-09-07 NOTE — Progress Notes (Signed)
PT Cancellation Note ? ?Patient Details ?Name: Kristi Romero ?MRN: [id number] ?DOB: 1956-01-25 ? ? ?Cancelled Treatment:    Reason Eval/Treat Not Completed: Patient not medically ready ? ?With new onset PE and ?LE DVT ([place] is pending), best practice to reduce risk of complications is for patient to be on heparin x 24 hours AND at therapeutic level. Patient's heparin was started 09/06/21 at 12:06 pm and currently is <0.10 (non-therapeutic). Will monitor for appropriateness to proceed with evaluation after 12:00 pm today.  ? ? ?Arby Barrette, PT ?Acute Rehabilitation Services  ?Pager [phone] ?Office [phone] ? ?[name] ?09/07/2021, 7:23 AM ?

## 2021-09-07 NOTE — Progress Notes (Signed)
ANTICOAGULATION CONSULT NOTE  ? ?Pharmacy Consult for Heparin infusion ?Indication: pulmonary embolus ? ?Allergies  ?Allergen Reactions  ? Zestril [Lisinopril] Anaphylaxis and Swelling  ? Codeine Nausea Only and Nausea And Vomiting  ?  Other reaction(s): GI Upset (intolerance)  ? ? ?Patient Measurements: ?Height: '5\' 4"'$  (162.6 cm) ?Weight: 70.3 kg (155 lb) ?IBW/kg (Calculated) : 54.7 ?Heparin Dosing Weight: 69 kg ? ?Vital Signs: ?Temp: 98.2 ?F (36.8 ?C) (05/12 2000) ?Temp Source: Oral (05/12 2000) ?BP: 124/53 (05/13 0005) ?Pulse Rate: 63 (05/13 0100) ? ?Labs: ?Recent Labs  ?  09/06/21 ?0617 09/06/21 ?0828 09/06/21 ?1200 09/06/21 ?1832 09/07/21 ?0133  ?HGB 11.8*  --   --   --  10.5*  ?HCT 36.5  --   --   --  33.0*  ?PLT 435*  --   --   --  360  ?HEPARINUNFRC  --   --  <0.10* 0.23* <0.10*  ?CREATININE 0.72  --   --   --  0.61  ?TROPONINIHS  --  15 13  --   --   ? ? ? ?Estimated Creatinine Clearance: 66.5 mL/min (by C-G formula based on SCr of 0.61 mg/dL). ? ? ?Medical History: ?Past Medical History:  ?Diagnosis Date  ? Abdominal pain, epigastric   ? Accident occurring in home 05/07/2016  ? Adrenal adenoma 03/16/2018  ? Anxiety and depression 03/16/2018  ? Cervicogenic headache 10/14/2016  ? Chronic back pain   ? Constipation 04/08/2021  ? Dizziness 10/14/2016  ? Elevated lipase 01/13/2017  ? Formatting of this note might be different from the original. May 2018 - 332  ? Fall from other slipping, tripping, or stumbling 05/07/2016  ? Hiatal hernia   ? Hypercholesteremia   ? Hyperlipidemia   ? Hypertension   ? Hypoglycemia 03/16/2018  ? Intractable nausea and vomiting 03/16/2018  ? 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 Malnutrition ([place]) 10/14/2016  ? Nausea & vomiting 03/17/2018  ? Neuropathy 10/28/2017  ? 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 Numbness   ? Numbness and tingling 10/14/2016  ? Sleeping difficulty 04/18/2016  ? 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 Thyroid nodule  07/22/2016  ? Urinary urgency 05/07/2016  ? Ventral hernia 05/07/2016  ? Vitamin D deficiency 01/13/2017  ? ? ?Medications:  ?Medications Prior to Admission  ?Medication Sig Dispense Refill Last Dose  ? acetaminophen (TYLENOL) 500 MG tablet Take 500 mg by mouth every 6 (six) hours as needed for mild pain.   Past Week  ? cefdinir (OMNICEF) 300 MG capsule Take 300 mg by mouth 2 (two) times daily. 12 day course. Pt is on day 3   09/05/2021  ? dicyclomine (BENTYL) 20 MG tablet Take 20 mg by mouth 4 (four) times daily as needed for spasms.   09/05/2021  ? DULoxetine (CYMBALTA) 60 MG capsule Take 60 mg by mouth daily.   09/05/2021  ? ferrous sulfate 325 (65 FE) MG tablet Take 325 mg by mouth daily.   09/05/2021  ? gabapentin (NEURONTIN) 300 MG capsule Take 300 mg by mouth at bedtime.   09/05/2021  ? meclizine (ANTIVERT) 25 MG tablet Take 25 mg by mouth 4 (four) times daily as needed for dizziness.   09/05/2021  ? mirtazapine (REMERON) 15 MG tablet Take 15 mg by mouth at bedtime.   09/05/2021  ? ondansetron (ZOFRAN-ODT) 4 MG disintegrating tablet Take 4 mg by mouth 2 (two) times daily as needed for nausea or vomiting.   09/05/2021  ? polyethylene glycol powder (  GLYCOLAX/MIRALAX) 17 GM/SCOOP powder Take 17 Containers by mouth daily.   unk  ? famotidine (PEPCID) 20 MG tablet Take by mouth. (Patient not taking: Reported on 09/06/2021)   Not Taking  ? ? ?Assessment: ?66 yo F presented to the ED with SOB and chest pain and found to have a PE. No anticoagulation reported prior to admission.  ? ?Heparin undetectable: <0.10, no issues with infusion or s/sx of bleeding reported ? ?Goal of Therapy:  ?Heparin level 0.3-0.7 units/ml ?Monitor platelets by anticoagulation protocol: Yes ?  ?Plan:  ?Heparin bolus 2000 units IV x 1,  ?Increase heparin infusion to 1450 units/hr ?Heparin level in 6 hours and repeat until within therapeutic range x 2 ?Daily CBC, heparin level, and monitor for s/sx of bleeding.  ?F/u plan to transition to oral  therapy ? ?Georga Bora, PharmD ?Clinical Pharmacist ?09/07/2021 3:18 AM ?Please check AMION for all [place] numbers ? ? ? ?

## 2021-09-07 NOTE — Progress Notes (Signed)
SATURATION QUALIFICATIONS: (This note is used to comply with regulatory documentation for home oxygen) ? ?Patient Saturations on Room Air at Rest = 95% ? ?Patient Saturations on Room Air while Ambulating = 95% ? ? ? ?Please briefly explain why patient needs home oxygen: Patient does NOT need home oxygen as sats >87% on room air during functional activity. ? ? ?Arby Barrette, PT ?Acute Rehabilitation Services  ?Pager [phone] ?Office [phone] ? ?

## 2021-09-07 NOTE — TOC Transition Note (Signed)
Transition of Care (TOC) - CM/SW Discharge Note ? ? ?Patient Details  ?Name: Kristi Romero ?MRN: [id number] ?Date of Birth: 1955-10-15 ? ?Transition of Care (TOC) CM/SW Contact:  ?Carles Collet, RN ?Phone Number: ?09/07/2021, 11:33 AM ? ? ?Clinical Narrative:   Provided with 30 day Eliquis 30 day card. No other TOC needs identified for DC ? ? ? ?  ?Barriers to Discharge: Continued Medical Work up ? ? ?Patient Goals and CMS Choice ?  ?  ?  ? ?Discharge Placement ?  ?           ?  ?  ?  ?  ? ?Discharge Plan and Services ?  ?Discharge Planning Services: CM Consult ?           ?  ?  ?  ?  ?  ?  ?  ?  ?  ?  ? ?Social Determinants of Health (SDOH) Interventions ?  ? ? ?Readmission Risk Interventions ?   ? View : No data to display.  ?  ?  ?  ? ? ? ? ? ?

## 2021-09-07 NOTE — Progress Notes (Signed)
ANTICOAGULATION CONSULT NOTE  ? ?Pharmacy Consult for Apixaban ?Indication: pulmonary embolus ? ?Allergies  ?Allergen Reactions  ? Zestril [Lisinopril] Anaphylaxis and Swelling  ? Codeine Nausea Only and Nausea And Vomiting  ?  Other reaction(s): GI Upset (intolerance)  ? ? ?Patient Measurements: ?Height: '5\' 4"'$  (162.6 cm) ?Weight: 70.3 kg (155 lb) ?IBW/kg (Calculated) : 54.7 ?Heparin Dosing Weight: 69 kg ? ?Vital Signs: ?Temp: 97.9 ?F (36.6 ?C) (05/13 0749) ?Temp Source: Oral (05/13 0749) ?BP: 111/59 (05/13 0749) ?Pulse Rate: 60 (05/13 0749) ? ?Labs: ?Recent Labs  ?  09/06/21 ?0617 09/06/21 ?0828 09/06/21 ?1200 09/06/21 ?1832 09/07/21 ?0133  ?HGB 11.8*  --   --   --  10.5*  ?HCT 36.5  --   --   --  33.0*  ?PLT 435*  --   --   --  360  ?HEPARINUNFRC  --   --  <0.10* 0.23* <0.10*  ?CREATININE 0.72  --   --   --  0.61  ?TROPONINIHS  --  15 13  --   --   ? ? ? ?Estimated Creatinine Clearance: 66.5 mL/min (by C-G formula based on SCr of 0.61 mg/dL). ? ? ?Medical History: ?Past Medical History:  ?Diagnosis Date  ? Abdominal pain, epigastric   ? Accident occurring in home 05/07/2016  ? Adrenal adenoma 03/16/2018  ? Anxiety and depression 03/16/2018  ? Cervicogenic headache 10/14/2016  ? Chronic back pain   ? Constipation 04/08/2021  ? Dizziness 10/14/2016  ? Elevated lipase 01/13/2017  ? Formatting of this note might be different from the original. May 2018 - 332  ? Fall from other slipping, tripping, or stumbling 05/07/2016  ? Hiatal hernia   ? Hypercholesteremia   ? Hyperlipidemia   ? Hypertension   ? Hypoglycemia 03/16/2018  ? Intractable nausea and vomiting 03/16/2018  ? 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 Malnutrition ([place]) 10/14/2016  ? Nausea & vomiting 03/17/2018  ? Neuropathy 10/28/2017  ? 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 Numbness   ? Numbness and tingling 10/14/2016  ? Sleeping difficulty 04/18/2016  ? 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 Thyroid nodule 07/22/2016  ?  Urinary urgency 05/07/2016  ? Ventral hernia 05/07/2016  ? Vitamin D deficiency 01/13/2017  ? ? ?Medications:  ?Medications Prior to Admission  ?Medication Sig Dispense Refill Last Dose  ? acetaminophen (TYLENOL) 500 MG tablet Take 500 mg by mouth every 6 (six) hours as needed for mild pain.   Past Week  ? cefdinir (OMNICEF) 300 MG capsule Take 300 mg by mouth 2 (two) times daily. 12 day course. Pt is on day 3   09/05/2021  ? dicyclomine (BENTYL) 20 MG tablet Take 20 mg by mouth 4 (four) times daily as needed for spasms.   09/05/2021  ? DULoxetine (CYMBALTA) 60 MG capsule Take 60 mg by mouth daily.   09/05/2021  ? ferrous sulfate 325 (65 FE) MG tablet Take 325 mg by mouth daily.   09/05/2021  ? gabapentin (NEURONTIN) 300 MG capsule Take 300 mg by mouth at bedtime.   09/05/2021  ? meclizine (ANTIVERT) 25 MG tablet Take 25 mg by mouth 4 (four) times daily as needed for dizziness.   09/05/2021  ? mirtazapine (REMERON) 15 MG tablet Take 15 mg by mouth at bedtime.   09/05/2021  ? ondansetron (ZOFRAN-ODT) 4 MG disintegrating tablet Take 4 mg by mouth 2 (two) times daily as needed for nausea or vomiting.   09/05/2021  ? polyethylene glycol powder (GLYCOLAX/MIRALAX)  17 GM/SCOOP powder Take 17 Containers by mouth daily.   unk  ? famotidine (PEPCID) 20 MG tablet Take by mouth. (Patient not taking: Reported on 09/06/2021)   Not Taking  ? ? ?Assessment: ?66 yo F presented to the ED with SOB and chest pain and found to have a PE. No anticoagulation reported prior to admission. Patient is being transitioned from heparin to apixaban.  ? ?Heparin was last undetectable <0.10 and no issues with infusion or s/sx of bleeding reported, but has been subtherapeutic for 24 hours. Transitioning to apixaban for PE treatment. Hgb dropped slightly from 11.8 to 10.5 and plates are within normal limits. ? ?Goal of Therapy:  ?Monitor platelets by anticoagulation protocol: Yes ?  ?Plan:  ?Discontinue heparin ?Initiate apixaban 10 mg BID x 7 days, then 5 mg  BID ?Daily CBC and monitor for s/sx of bleeding.  ? ? ?Varney Daily, PharmD ?PGY1 Pharmacy Resident ? ?Please check AMION for all [facility] pharmacy phone numbers ?After 10:00 PM call main pharmacy [phone] ? ? ? ? ?

## 2021-09-07 NOTE — Plan of Care (Signed)
Patient progressing towards goals.

## 2021-09-07 NOTE — Discharge Instructions (Addendum)
Follow with Primary MD Barnetta Chapel, NP in 7 days, get lower extremity venous ultrasound done. ? ?Get CBC, CMP, 2 view Chest X ray -  checked next visit within 1 week by Primary MD  ? ?Activity: As tolerated with Full fall precautions use walker/cane & assistance as needed ? ?Disposition Home  ? ?Diet: Heart Healthy  ? ?Special Instructions: If you have smoked or chewed Tobacco  in the last 2 yrs please stop smoking, stop any regular Alcohol  and or any Recreational drug use. ? ?On your next visit with your primary care physician please Get Medicines reviewed and adjusted. ? ?Please request your Prim.MD to go over all Hospital Tests and Procedure/Radiological results at the follow up, please get all Hospital records sent to your Prim MD by signing hospital release before you go home. ? ?If you experience worsening of your admission symptoms, develop shortness of breath, life threatening emergency, suicidal or homicidal thoughts you must seek medical attention immediately by calling 911 or calling your MD immediately  if symptoms less severe. ? ?You Must read complete instructions/literature along with all the possible adverse reactions/side effects for all the Medicines you take and that have been prescribed to you. Take any new Medicines after you have completely understood and accpet all the possible adverse reactions/side effects.  ? ? ? ?________________________________________ ? ?Information on my medicine - ELIQUIS? (apixaban) ? ?Why was Eliquis? prescribed for you? ?Eliquis? was prescribed to treat blood clots that may have been found in the veins of your legs (deep vein thrombosis) or in your lungs (pulmonary embolism) and to reduce the risk of them occurring again. ? ?What do You need to know about Eliquis? ? ?The starting dose is 10 mg (two 5 mg tablets) taken TWICE daily for the FIRST SEVEN (7) DAYS, then on (enter date)  09/14/21  the dose is reduced to ONE 5 mg tablet taken TWICE daily.  Eliquis? may  be taken with or without food.  ? ?Try to take the dose about the same time in the morning and in the evening. If you have difficulty swallowing the tablet whole please discuss with your pharmacist how to take the medication safely. ? ?Take Eliquis? exactly as prescribed and DO NOT stop taking Eliquis? without talking to the doctor who prescribed the medication.  Stopping may increase your risk of developing a new blood clot.  Refill your prescription before you run out. ? ?After discharge, you should have regular check-up appointments with your healthcare provider that is prescribing your Eliquis?. ?   ?What do you do if you miss a dose? ?If a dose of ELIQUIS? is not taken at the scheduled time, take it as soon as possible on the same day and twice-daily administration should be resumed. The dose should not be doubled to make up for a missed dose. ? ?Important Safety Information ?A possible side effect of Eliquis? is bleeding. You should call your healthcare provider right away if you experience any of the following: ?Bleeding from an injury or your nose that does not stop. ?Unusual colored urine (red or dark brown) or unusual colored stools (red or black). ?Unusual bruising for unknown reasons. ?A serious fall or if you hit your head (even if there is no bleeding). ? ?Some medicines may interact with Eliquis? and might increase your risk of bleeding or clotting while on Eliquis?Marland Kitchen To help avoid this, consult your healthcare provider or pharmacist prior to using any new prescription or non-prescription medications, including  herbals, vitamins, non-steroidal anti-inflammatory drugs (NSAIDs) and supplements. ? ?This website has more information on Eliquis? (apixaban): http://www.eliquis.com/eliquis/home ? ? ?  ?

## 2021-09-07 NOTE — Evaluation (Signed)
Physical Therapy Evaluation ?Patient Details ?Name: Kristi Romero ?MRN: [id number] ?DOB: 06-23-1955 ?Today's Date: 09/07/2021 ? ?History of Present Illness ? 66 y.o. female who presented to ED with complaints of shortness of breath, pleuritic chest pain and pain all over since being discharged from a hospital in [place] on 09/02/21. CTA chest +PE; +saddle parasthesias and urinary incontinence; PMH significant of anxiety and depression, chronic back pain, HLD, carcinoid tumor of the ileum s/p resection, chronic abdominal pain, nausea and neuropathy  ?Clinical Impression ?  ?Patient evaluated by Physical Therapy with no further acute PT needs identified. Patient ambulated short household distance with min assist with sats 95% on room air. Patient is at her baseline with no PT needs. PT is signing off. Thank you for this referral. ?   ?   ? ?Recommendations for follow up therapy are one component of a multi-disciplinary discharge planning process, led by the attending physician.  Recommendations may be updated based on patient status, additional functional criteria and insurance authorization. ? ?Follow Up Recommendations No PT follow up ? ?  ?Assistance Recommended at Discharge PRN  ?Patient can return home with the following ? Assistance with cooking/housework;Help with stairs or ramp for entrance (as PTA) ? ?  ?Equipment Recommendations None recommended by PT  ?Recommendations for Other Services ?    ?  ?Functional Status Assessment Patient has not had a recent decline in their functional status  ? ?  ?Precautions / Restrictions Precautions ?Precautions: Fall  ? ?  ? ?Mobility ? Bed Mobility ?Overal bed mobility: Modified Independent ?  ?  ?  ?  ?  ?  ?General bed mobility comments: HOB elevated ?  ? ?Transfers ?Overall transfer level: Needs assistance ?Equipment used: 1 person hand held assist ?Transfers: Sit to/from Stand ?Sit to Stand: Min guard ?  ?  ?  ?  ?  ?General transfer comment: pt states she holds onto  furniture at home and requested HHA (very light support) ?  ? ?Ambulation/Gait ?Ambulation/Gait assistance: Min assist ?Gait Distance (Feet): 25 Feet ?Assistive device: 1 person hand held assist ?Gait Pattern/deviations: Step-through pattern, Decreased stride length ?  ?Gait velocity interpretation: 1.31 - 2.62 ft/sec, indicative of limited community ambulator ?  ?General Gait Details: again refused need for RW yet holding onto counter and then HHA (very light assist) ? ?Stairs ?  ?  ?  ?  ?  ? ?Wheelchair Mobility ?  ? ?Modified Rankin (Stroke Patients Only) ?  ? ?  ? ?Balance Overall balance assessment: Mild deficits observed, not formally tested ?  ?  ?  ?  ?  ?  ?  ?  ?  ?  ?  ?  ?  ?  ?  ?  ?  ?  ?   ? ? ? ?Pertinent Vitals/Pain    ? ? ?Home Living Family/patient expects to be discharged to:: Private residence ?Living Arrangements: Other relatives (cousing) ?Available Help at Discharge: Family;Personal care attendant;Available PRN/intermittently ?Type of Home: House ?Home Access: Ramped entrance ?  ?  ?  ?Home Layout: One level ?Home Equipment: Conservation officer, nature (2 wheels);Wheelchair - manual ?   ?  ?Prior Function Prior Level of Function : Needs assist ?  ?  ?  ?  ?  ?  ?Mobility Comments: primarily uses w/c for locomotion except short distances inside home; working on getting a power chair ?ADLs Comments: cousin or aide assist with housework; either drive her to grocery store and she uses an Web designer ?  ? ? ?  Hand Dominance  ?   ? ?  ?Extremity/Trunk Assessment  ? Upper Extremity Assessment ?Upper Extremity Assessment: Generalized weakness ?  ? ?Lower Extremity Assessment ?Lower Extremity Assessment: Generalized weakness ?  ? ?Cervical / Trunk Assessment ?Cervical / Trunk Assessment: Normal  ?Communication  ? Communication: No difficulties  ?Cognition Arousal/Alertness: Awake/alert ?Behavior During Therapy: [facility] for tasks assessed/performed ?Overall Cognitive Status: Within Functional Limits for tasks  assessed ?  ?  ?  ?  ?  ?  ?  ?  ?  ?  ?  ?  ?  ?  ?  ?  ?  ?  ?  ? ?  ?General Comments   ? ?  ?Exercises    ? ?Assessment/Plan  ?  ?PT Assessment Patient does not need any further PT services  ?PT Problem List   ? ?   ?  ?PT Treatment Interventions     ? ?PT Goals (Current goals can be found in the Care Plan section)  ?Acute Rehab PT Goals ?PT Goal Formulation: All assessment and education complete, DC therapy ? ?  ?Frequency   ?  ? ? ?Co-evaluation   ?  ?  ?  ?  ? ? ?  ?AM-PAC PT "6 Clicks" Mobility  ?Outcome Measure Help needed turning from your back to your side while in a flat bed without using bedrails?: None ?Help needed moving from lying on your back to sitting on the side of a flat bed without using bedrails?: None ?Help needed moving to and from a bed to a chair (including a wheelchair)?: A Little ?Help needed standing up from a chair using your arms (e.g., wheelchair or bedside chair)?: A Little ?Help needed to walk in hospital room?: A Little ?Help needed climbing 3-5 steps with a railing? : A Little ?6 Click Score: 20 ? ?  ?End of Session Equipment Utilized During Treatment: Gait belt ?Activity Tolerance: Patient tolerated treatment well ?Patient left: in bed;with call bell/phone within reach;with bed alarm set ?Nurse Communication: Mobility status;Other (comment) (no PT or DME needs) ?PT Visit Diagnosis: Difficulty in walking, not elsewhere classified (R26.2) ?  ? ?Time: 6063-0160 ?PT Time Calculation (min) (ACUTE ONLY): 11 min ? ? ?Charges:   PT Evaluation ?$PT Eval Low Complexity: 1 Low ?  ?  ?   ? ? ? ?Arby Barrette, PT ?Acute Rehabilitation Services  ?Pager [phone] ?Office [phone] ? ? ?Jeanie Cooks Vint Pola ?09/07/2021, 1:07 PM ? ?

## 2021-09-07 NOTE — Discharge Summary (Signed)
?                                                                                ? Kristi Romero HER:[id number] DOB: Dec 01, 1955 DOA: 09/06/2021 ? ?PCP: Barnetta Chapel, NP ? ?Admit date: 09/06/2021  Discharge date: 09/07/2021 ? ?Admitted From: Home   Disposition:  Home ? ? ?Recommendations for Outpatient Follow-up:  ? ?Follow up with PCP in 1-2 weeks ? ?PCP Please obtain BMP/CBC, 2 view CXR in 1week,  (see Discharge instructions)  ? ?PCP Please follow up on the following pending results: Outpatient lower extremity venous duplex if desired.  Check CBC, CMP, magnesium and a two-view chest x-ray in 7 to 10 days.  Outpatient GI and neurosurgery follow-up. ? ? ?Home Health: None   ?Equipment/Devices: None  ?Consultations: None  ?Discharge Condition: Stable    ?CODE STATUS: Full    ?Diet Recommendation: Heart Healthy  ?  ? ?Chief Complaint  ?Patient presents with  ? Back Pain  ? Dysuria  ?  ? ?Brief history of present illness from the day of admission and additional interim summary   ? ?66 y.o. 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 ? ?                                                               Hospital Course  ? ? ?Small subsegmental nonocclusive PE -hemodynamically stable, currently symptom-free, was kept on IV heparin and now transition to oral Eliquis, will get 1 month supply of Eliquis.  If she qualifies for oxygen she will get it.  Outpatient lower extremity venous  duplex if desired by PCP, this will not change the management.  Currently symptom-free. ? ?2.  Hypokalemia.  Replaced. ? ?3.  Reactive leukocytosis.  Outpatient monitoring by PCP repeat two-view chest x-ray and CBC in 7 to 10 days.  She is finishing her oral antibiotic course for recently diagnosed pneumonia ? ?4.  Chronic abdominal and low back pain.  Supportive care, follow-up with PCP, does have some nonspecific L-spine changes on her MRI of the L-spine for which she will follow-up with neurosurgery outpatient.  She has no new symptoms and her symptoms have been chronic for at least 6 months.  No weakness.  Mostly bilateral lower extremity discomfort mostly tingling and numbness. ? ?5.  Chronic neuropathy.  Continue Neurontin. ? ?6.  History of malignant carcinoid of the ileum.  Outpatient follow-up with PCP and her surgeon.  On monthly octreotide. ? ?7.  Chronic normocytic anemia.  Outpatient follow-up and monitoring by PCP.  Placed on PPI as now she is on Eliquis. ? ?Discharge diagnosis   ? ? ?Principal Problem: ?  Pulmonary embolism with acute respiratory failure with hypoxia  ?Active Problems: ?  Hypokalemia ?  Leukocytosis ?  acute on chronic back pain ?  Chronic abdominal pain and nausea  ?  Malignant carcinoid tumor of ileum ([place]) ?  Normocytic anemia ?  Neuropathy ?  Anxiety and depression ?  Acute respiratory failure with hypoxia ([place]) ? ? ? ?Discharge instructions   ? ?Discharge Instructions   ? ? Diet - low sodium heart healthy   Complete by: As directed ?  ? Discharge instructions   Complete by: As directed ?  ? Follow with Primary MD Barnetta Chapel, NP in 7 days, get lower extremity venous ultrasound done.  Get your MiraLAX dose readjusted. ? ?Get CBC, CMP, 2 view Chest X ray -  checked next visit within 1 week by Primary MD  ? ?Activity: As tolerated with Full fall precautions use walker/cane & assistance as needed ? ?Disposition Home  ? ?Diet: Heart Healthy  ? ?Special Instructions: If you have  smoked or chewed Tobacco  in the last 2 yrs please stop smoking, stop any regular Alcohol  and or any Recreational drug use. ? ?On your next visit with your primary care physician please Get Medicines reviewed and adjusted. ? ?Please request your Prim.MD to go over all Hospital Tests and Procedure/Radiological results at the follow up, please get all Hospital records sent to your Prim MD by signing hospital release before you go home. ? ?If you experience worsening of your admission symptoms, develop shortness of breath, life threatening emergency, suicidal or homicidal thoughts you must seek medical attention immediately by calling 911 or calling your MD immediately  if symptoms less severe. ? ?You Must read complete instructions/literature along with all the possible adverse reactions/side effects for all the Medicines you take and that have been prescribed to you. Take any new Medicines after you have completely understood and accpet all the possible adverse reactions/side effects.  ? Increase activity slowly   Complete by: As directed ?  ? ?  ? ? ?Discharge Medications  ? ?Allergies as of 09/07/2021   ? ?   Reactions  ? Zestril [lisinopril] Anaphylaxis, Swelling  ? Codeine Nausea Only, Nausea And Vomiting  ? Other reaction(s): GI Upset (intolerance)  ? ?  ? ?  ?Medication List  ?  ? ?STOP taking these medications   ? ?famotidine 20 MG tablet ?Commonly known as: PEPCID ?  ?polyethylene glycol powder 17 GM/SCOOP powder ?Commonly known as: GLYCOLAX/MIRALAX ?  ? ?  ? ?TAKE these medications   ? ?acetaminophen 500 MG tablet ?Commonly known as: TYLENOL ?Take 500 mg by mouth every 6 (six) hours as needed for mild pain. ?  ?apixaban 5 MG Tabs tablet ?Commonly known as: ELIQUIS ?Take 2 tablets (10 mg total) by mouth 2 (two) times daily for 6 days. ?  ?apixaban 5 MG Tabs tablet ?Commonly known as: ELIQUIS ?Take 1 tablet (5 mg total) by mouth 2 (two) times daily. ?Start taking on: Sep 14, 2021 ?  ?cefdinir 300 MG  capsule ?Commonly known as: OMNICEF ?Take 300 mg by mouth 2 (two) times daily. 12 day course. Pt is on day 3 ?  ?dicyclomine 20 MG tablet ?Commonly known as: BENTYL ?Take 20 mg by mouth 4 (four) times daily as needed for spasms. ?  ?DULoxetine 60 MG capsule ?Commonly known as: CYMBALTA ?Take 60 mg  by mouth daily. ?  ?ferrous sulfate 325 (65 FE) MG tablet ?Take 325 mg by mouth daily. ?  ?gabapentin 300 MG capsule ?Commonly known as: NEURONTIN ?Take 300 mg by mouth at bedtime. ?  ?meclizine 25 MG tablet ?Commonly known as: ANTIVERT ?Take 25 mg by mouth 4 (four) times daily as needed for dizziness. ?  ?mirtazapine 15 MG tablet ?Commonly known as: REMERON ?Take 15 mg by mouth at bedtime. ?  ?ondansetron 4 MG disintegrating tablet ?Commonly known as: ZOFRAN-ODT ?Take 4 mg by mouth 2 (two) times daily as needed for nausea or vomiting. ?  ?pantoprazole 40 MG tablet ?Commonly known as: Protonix ?Take 1 tablet (40 mg total) by mouth daily. ?  ? ?  ? ? ? Follow-up Information   ? ? Dawley, Troy C, DO. Schedule an appointment as soon as possible for a visit in 1 week(s).   ?Why: L-spine Disc prolapse. ?Contact information: ?[address] ?[address] ?[place] [address] ?[phone] ? ? ?  ?  ? ? Barnetta Chapel, NP. Schedule an appointment as soon as possible for a visit in 1 week(s).   ?Specialty: Family Medicine ?Why: Please review your CT scan, MRI findings with your PCP next visit. ?Contact information: ?[address] ?[place] [address] ?[phone] ? ? ?  ?  ? ?  ?  ? ?  ? ? ?Major procedures and Radiology Reports - PLEASE review detailed and final reports thoroughly  -    ? ? ? ? ?CT Angio Chest PE W and/or Wo Contrast ? ?Result Date: 09/06/2021 ?CLINICAL DATA:  Pulmonary embolism suspected, abdominal pain EXAM: CT ANGIOGRAPHY CHEST CT ABDOMEN AND PELVIS WITH CONTRAST TECHNIQUE: Multidetector CT imaging of the chest was performed using the standard protocol during bolus administration of intravenous contrast.  Multiplanar CT image reconstructions and MIPs were obtained to evaluate the vascular anatomy. Multidetector CT imaging of the abdomen and pelvis was performed using the standard protocol during bolus administration o

## 2021-09-09 ENCOUNTER — Emergency Department (HOSPITAL_COMMUNITY): Payer: Medicare HMO

## 2021-09-09 ENCOUNTER — Encounter (HOSPITAL_COMMUNITY): Payer: Self-pay | Admitting: Emergency Medicine

## 2021-09-09 ENCOUNTER — Other Ambulatory Visit: Payer: Self-pay

## 2021-09-09 ENCOUNTER — Emergency Department (HOSPITAL_COMMUNITY)
Admission: EM | Admit: 2021-09-09 | Discharge: 2021-09-09 | Disposition: A | Discharge status: Home / Self Care | Payer: Medicare HMO | Attending: Emergency Medicine | Admitting: Emergency Medicine

## 2021-09-09 DIAGNOSIS — I10 Essential (primary) hypertension: Secondary | ICD-10-CM | POA: Insufficient documentation

## 2021-09-09 DIAGNOSIS — R0789 Other chest pain: Secondary | ICD-10-CM

## 2021-09-09 DIAGNOSIS — S29011A Strain of muscle and tendon of front wall of thorax, initial encounter: Secondary | ICD-10-CM | POA: Insufficient documentation

## 2021-09-09 DIAGNOSIS — Z79899 Other long term (current) drug therapy: Secondary | ICD-10-CM | POA: Insufficient documentation

## 2021-09-09 DIAGNOSIS — Z7901 Long term (current) use of anticoagulants: Secondary | ICD-10-CM | POA: Insufficient documentation

## 2021-09-09 DIAGNOSIS — R0602 Shortness of breath: Secondary | ICD-10-CM | POA: Insufficient documentation

## 2021-09-09 DIAGNOSIS — X58XXXA Exposure to other specified factors, initial encounter: Secondary | ICD-10-CM | POA: Diagnosis not present

## 2021-09-09 DIAGNOSIS — T148XXA Other injury of unspecified body region, initial encounter: Secondary | ICD-10-CM

## 2021-09-09 LAB — CBC WITH DIFFERENTIAL/PLATELET
Abs Immature Granulocytes: 0.08 10*3/uL — ABNORMAL HIGH (ref 0.00–0.07)
Basophils Absolute: 0 10*3/uL (ref 0.0–0.1)
Basophils Relative: 0 %
Eosinophils Absolute: 0 10*3/uL (ref 0.0–0.5)
Eosinophils Relative: 0 %
HCT: 35.6 % — ABNORMAL LOW (ref 36.0–46.0)
Hemoglobin: 11.7 g/dL — ABNORMAL LOW (ref 12.0–15.0)
Immature Granulocytes: 1 %
Lymphocytes Relative: 37 %
Lymphs Abs: 4.6 10*3/uL — ABNORMAL HIGH (ref 0.7–4.0)
MCH: 31.7 pg (ref 26.0–34.0)
MCHC: 32.9 g/dL (ref 30.0–36.0)
MCV: 96.5 fL (ref 80.0–100.0)
Monocytes Absolute: 1.2 10*3/uL — ABNORMAL HIGH (ref 0.1–1.0)
Monocytes Relative: 10 %
Neutro Abs: 6.5 10*3/uL (ref 1.7–7.7)
Neutrophils Relative %: 52 %
Platelets: 520 10*3/uL — ABNORMAL HIGH (ref 150–400)
RBC: 3.69 MIL/uL — ABNORMAL LOW (ref 3.87–5.11)
RDW: 13.2 % (ref 11.5–15.5)
WBC: 12.5 10*3/uL — ABNORMAL HIGH (ref 4.0–10.5)
nRBC: 0 % (ref 0.0–0.2)

## 2021-09-09 LAB — BASIC METABOLIC PANEL
Anion gap: 9 (ref 5–15)
BUN: 5 mg/dL — ABNORMAL LOW (ref 8–23)
CO2: 27 mmol/L (ref 22–32)
Calcium: 9.4 mg/dL (ref 8.9–10.3)
Chloride: 97 mmol/L — ABNORMAL LOW (ref 98–111)
Creatinine, Ser: 0.74 mg/dL (ref 0.44–1.00)
GFR, Estimated: >60 mL/min (ref >60)
Glucose, Bld: 91 mg/dL (ref 70–99)
Potassium: 3.7 mmol/L (ref 3.5–5.1)
Sodium: 133 mmol/L — ABNORMAL LOW (ref 135–145)

## 2021-09-09 LAB — BRAIN NATRIURETIC PEPTIDE: B Natriuretic Peptide: 184.7 pg/mL — ABNORMAL HIGH (ref 0.0–100.0)

## 2021-09-09 LAB — TROPONIN I (HIGH SENSITIVITY): Troponin I (High Sensitivity): 8 ng/L (ref <18)

## 2021-09-09 MED ORDER — ACETAMINOPHEN 500 MG PO TABS
1000.0000 mg | ORAL_TABLET | Freq: Once | ORAL | Status: AC
  Administered 2021-09-09: 1000 mg via ORAL
  Filled 2021-09-09: qty 2

## 2021-09-09 MED ORDER — IOHEXOL 300 MG/ML  SOLN
60.0000 mL | Freq: Once | INTRAMUSCULAR | Status: AC | PRN
  Administered 2021-09-09: 60 mL via INTRAVENOUS

## 2021-09-09 MED ORDER — METHOCARBAMOL 500 MG PO TABS: 500.0000 mg | ORAL_TABLET | Freq: Two times a day (BID) | ORAL | 0 refills | Status: DC

## 2021-09-09 NOTE — Discharge Instructions (Signed)
You may use over-the-counter Acetaminophen (Tylenol), topical muscle creams such as SalonPas, Icy Hot, Bengay, etc. Please stretch, apply ice or heat (whichever helps), and have massage therapy for additional assistance.  

## 2021-09-09 NOTE — ED Provider Notes (Signed)
?[facility] ?Provider Note ? ?CSN: [id number] ?Arrival date & time: 09/09/21 0325 ? ?Chief Complaint(s) ?Chest Pain ? ?HPI ?Kristi Romero is a 66 y.o. female with a past medical history listed below including recent PE on Eliquis who presents to the emergency department for persistent chest pain for the past several days worse with movement, palpation, and deep breathing.  Pain is mostly on the left side located in the parascapular and left lateral regions.  No coughing or congestion.  Patient endorses mild shortness of breath.  Pain is nonexertional.  No abdominal pain.  No nausea or vomiting.  No other physical complaints. ? ?Patient reports that she has been taking her Eliquis as prescribed ? ? ?Chest Pain ? ?Past Medical History ?Past Medical History:  ?Diagnosis Date  ? Abdominal pain, epigastric   ? Accident occurring in home 05/07/2016  ? Adrenal adenoma 03/16/2018  ? Anxiety and depression 03/16/2018  ? Cervicogenic headache 10/14/2016  ? Chronic back pain   ? Constipation 04/08/2021  ? Dizziness 10/14/2016  ? Elevated lipase 01/13/2017  ? Formatting of this note might be different from the original. May 2018 - 332  ? Fall from other slipping, tripping, or stumbling 05/07/2016  ? Hiatal hernia   ? Hypercholesteremia   ? Hyperlipidemia   ? Hypertension   ? Hypoglycemia 03/16/2018  ? Intractable nausea and vomiting 03/16/2018  ? Malignant carcinoid tumor of ileum ([place]) 04/28/2014  ? Malnutrition ([place]) 10/14/2016  ? Nausea & vomiting 03/17/2018  ? Neuropathy 10/28/2017  ? 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 Numbness   ? Numbness and tingling 10/14/2016  ? Sleeping difficulty 04/18/2016  ? 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 Thyroid nodule 07/22/2016  ? Urinary urgency 05/07/2016  ? Ventral hernia 05/07/2016  ? Vitamin D deficiency 01/13/2017  ? ?Patient Active Problem List  ? Diagnosis Date Noted  ? Pulmonary embolism with acute respiratory failure  with hypoxia  09/06/2021  ? Hypokalemia 09/06/2021  ? Chronic abdominal pain and nausea  09/06/2021  ? Leukocytosis 09/06/2021  ? Acute respiratory failure with hypoxia ([place]) 09/06/2021  ? Normocytic anemia 09/06/2021  ? Constipation 04/08/2021  ? Nausea 11/18/2019  ? Vomiting 03/17/2018  ? Intractable nausea and vomiting 03/16/2018  ? Hypoglycemia 03/16/2018  ? Adrenal adenoma 03/16/2018  ? Anxiety and depression 03/16/2018  ? Abdominal pain, epigastric   ? Neuropathy 10/28/2017  ? Elevated lipase 01/13/2017  ? Vitamin D deficiency 01/13/2017  ? Cervicogenic headache 10/14/2016  ? Dizziness 10/14/2016  ? Malnutrition ([place]) 10/14/2016  ? Numbness and tingling 10/14/2016  ? 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 Thyroid nodule 07/22/2016  ? Accident occurring in home 05/07/2016  ? Fall from other slipping, tripping, or stumbling 05/07/2016  ? Urinary urgency 05/07/2016  ? Ventral hernia 05/07/2016  ? Sleeping difficulty 04/18/2016  ? 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 Malignant carcinoid tumor of ileum ([place]) 04/28/2014  ?  Class: History of  ? acute on chronic back pain   ? Hiatal hernia   ? ?Home Medication(s) ?Prior to Admission medications   ?Medication Sig Start Date End Date Taking? Authorizing Provider  ?acetaminophen (TYLENOL) 500 MG tablet Take 500 mg by mouth every 6 (six) hours as needed for mild pain.   Yes [provider]  ?apixaban (ELIQUIS) 5 MG TABS tablet Take 2 tablets (10 mg total) by mouth 2 (two) times daily for 6 days. 09/07/21 09/13/21 Yes Thurnell Lose, MD  ?cefdinir (OMNICEF) 300 MG capsule  Take 300 mg by mouth See admin instructions. Bid x 12 days 09/02/21  Yes [provider]  ?dicyclomine (BENTYL) 20 MG tablet Take 20 mg by mouth 4 (four) times daily as needed for spasms.   Yes [provider]  ?DULoxetine (CYMBALTA) 60 MG capsule Take 60 mg by mouth daily.   Yes [provider]  ?ferrous sulfate 325 (65 FE) MG tablet Take 325 mg by  mouth daily.   Yes [provider]  ?gabapentin (NEURONTIN) 300 MG capsule Take 300 mg by mouth at bedtime. 05/09/21  Yes [provider]  ?meclizine (ANTIVERT) 25 MG tablet Take 25 mg by mouth 4 (four) times daily as needed for dizziness. 02/12/16  Yes [provider]  ?methocarbamol (ROBAXIN) 500 MG tablet Take 1 tablet (500 mg total) by mouth 2 (two) times daily. 09/09/21  Yes Khadeem Rockett, Grayce Sessions, MD  ?mirtazapine (REMERON) 15 MG tablet Take 15 mg by mouth at bedtime. 08/22/21  Yes [provider]  ?ondansetron (ZOFRAN-ODT) 4 MG disintegrating tablet Take 4 mg by mouth 2 (two) times daily as needed for nausea or vomiting.   Yes [provider]  ?pantoprazole (PROTONIX) 40 MG tablet Take 1 tablet (40 mg total) by mouth daily. 09/07/21  Yes Thurnell Lose, MD  ?apixaban (ELIQUIS) 5 MG TABS tablet Take 1 tablet (5 mg total) by mouth 2 (two) times daily. 09/14/21   Thurnell Lose, MD  ?                                                                                                                                  ?Allergies ?Zestril [lisinopril] and Codeine ? ?Review of Systems ?Review of Systems  ?Cardiovascular:  Positive for chest pain.  ?As noted in HPI ? ?Physical Exam ?Vital Signs  ?I have reviewed the triage vital signs ?BP 113/77   Pulse 78   Temp 98 ?F (36.7 ?C) (Oral)   Resp 19   LMP  (LMP Unknown)   SpO2 97%  ? ?Physical Exam ?Vitals reviewed.  ?Constitutional:   ?   General: She is not in acute distress. ?   Appearance: She is well-developed. She is not diaphoretic.  ?HENT:  ?   Head: Normocephalic and atraumatic.  ?   Nose: Nose normal.  ?Eyes:  ?   General: No scleral icterus.    ?   Right eye: No discharge.     ?   Left eye: No discharge.  ?   Conjunctiva/sclera: Conjunctivae normal.  ?   Pupils: Pupils are equal, round, and reactive to light.  ?Cardiovascular:  ?   Rate and Rhythm: Normal rate and regular rhythm.  ?   Heart sounds: No murmur  heard. ?  No friction rub. No gallop.  ?Pulmonary:  ?   Effort: Pulmonary effort is normal. No respiratory distress.  ?   Breath sounds: Normal breath sounds. No stridor. No rales.  ?  Chest:  ?   Chest wall: Tenderness present.  ? ? ?Abdominal:  ?   General: There is no distension.  ?   Palpations: Abdomen is soft.  ?   Tenderness: There is no abdominal tenderness.  ?Musculoskeletal:  ?   Cervical back: Normal range of motion and neck supple.  ?   Thoracic back: Spasms and tenderness present.  ?     Back: ? ?Skin: ?   General: Skin is warm and dry.  ?   Findings: No erythema or rash.  ?Neurological:  ?   Mental Status: She is alert and oriented to person, place, and time.  ? ? ?ED Results and Treatments ?Labs ?(all labs ordered are listed, but only abnormal results are displayed) ?Labs Reviewed  ?CBC WITH DIFFERENTIAL/PLATELET - Abnormal; Notable for the following components:  ?    Result Value  ? WBC 12.5 (*)   ? RBC 3.69 (*)   ? Hemoglobin 11.7 (*)   ? HCT 35.6 (*)   ? Platelets 520 (*)   ? Lymphs Abs 4.6 (*)   ? Monocytes Absolute 1.2 (*)   ? Abs Immature Granulocytes 0.08 (*)   ? All other components within normal limits  ?BASIC METABOLIC PANEL - Abnormal; Notable for the following components:  ? Sodium 133 (*)   ? Chloride 97 (*)   ? BUN 5 (*)   ? All other components within normal limits  ?BRAIN NATRIURETIC PEPTIDE - Abnormal; Notable for the following components:  ? B Natriuretic Peptide 184.7 (*)   ? All other components within normal limits  ?TROPONIN I (HIGH SENSITIVITY)  ?                                                                                                                       ?EKG ? EKG Interpretation ? ?Date/Time:  Monday Sep 09 2021 03:28:47 EDT ?Ventricular Rate:  90 ?PR Interval:  126 ?QRS Duration: 80 ?QT Interval:  384 ?QTC Calculation: 469 ?R Axis:   38 ?Text Interpretation: Normal sinus rhythm Normal ECG No acute changes When compared with ECG of 06-Sep-2021 11:31, PREVIOUS ECG IS  PRESENT Confirmed by Addison Lank [phone]) on 09/09/2021 5:28:55 AM ?  ? ?  ? ?Radiology ?CT Angio Chest PE W and/or Wo Contrast ? ?Result Date: 09/09/2021 ?CLINICAL DATA:  66 year old female with history of shortness

## 2021-09-09 NOTE — ED Provider Triage Note (Signed)
Emergency Medicine Provider Triage Evaluation Note ? ?Kristi Romero , a 66 y.o. female  was evaluated in triage.  Pt complains of chest pain.  Diagnosed with PE 09/06/21, admitted to the hospital overnight and discharged on eliquis but is not taking it-- has been taking cefdinir and meclizine.  Reports worsening chest pain and SOB tonight.   ? ?Review of Systems  ?Positive: Chest pain, SOB ?Negative: fever ? ?Physical Exam  ?BP (!) 116/54   Pulse 82   Temp 98 ?F (36.7 ?C) (Oral)   Resp (!) 36   LMP  (LMP Unknown)   SpO2 99%  ? ?Gen:   Awake, no distress   ?Resp:  Normal effort  ?MSK:   Moves extremities without difficulty  ?Other:   ? ?Medical Decision Making  ?Medically screening exam initiated at 3:26 AM.  Appropriate orders placed.  Carlyle Durr was informed that the remainder of the evaluation will be completed by another provider, this initial triage assessment does not replace that evaluation, and the importance of remaining in the ED until their evaluation is complete. ? ?Chest pain, SOB.  Recent diagnosis of PE but not taking eliquis as prescribed.  She is tachypneic in triage but no tachycardia or hypoxia noted.  Concern for possible development of right heart strain or other complication related to her PE.  EKG, labs, will repeat CTA of chest. ?  ?Larene Pickett, PA-C ?09/09/21 4536 ? ?

## 2021-09-09 NOTE — ED Triage Notes (Signed)
Patient from home complaining of chest wall pain, some mild shortness of breath.  No nausea or vomiting.  Patient was recently admitted for PE and was discharged on Eliquis, but has not been taking it.  Patient was discharged on 09/07/21.   ?

## 2021-09-10 ENCOUNTER — Encounter (HOSPITAL_COMMUNITY): Payer: Self-pay

## 2021-09-10 ENCOUNTER — Emergency Department (HOSPITAL_COMMUNITY)
Admission: EM | Admit: 2021-09-10 | Discharge: 2021-09-11 | Disposition: A | Discharge status: Home / Self Care | Payer: Medicare HMO | Attending: Emergency Medicine | Admitting: Emergency Medicine

## 2021-09-10 ENCOUNTER — Other Ambulatory Visit: Payer: Self-pay

## 2021-09-10 DIAGNOSIS — Z85830 Personal history of malignant neoplasm of bone: Secondary | ICD-10-CM | POA: Insufficient documentation

## 2021-09-10 DIAGNOSIS — Z7901 Long term (current) use of anticoagulants: Secondary | ICD-10-CM | POA: Diagnosis not present

## 2021-09-10 DIAGNOSIS — R0781 Pleurodynia: Secondary | ICD-10-CM | POA: Diagnosis not present

## 2021-09-10 DIAGNOSIS — I10 Essential (primary) hypertension: Secondary | ICD-10-CM | POA: Diagnosis not present

## 2021-09-10 DIAGNOSIS — M546 Pain in thoracic spine: Secondary | ICD-10-CM | POA: Insufficient documentation

## 2021-09-10 DIAGNOSIS — G8929 Other chronic pain: Secondary | ICD-10-CM | POA: Diagnosis not present

## 2021-09-10 DIAGNOSIS — R079 Chest pain, unspecified: Secondary | ICD-10-CM

## 2021-09-10 MED ORDER — MORPHINE SULFATE (PF) 4 MG/ML IV SOLN
4.0000 mg | Freq: Once | INTRAVENOUS | Status: AC
  Administered 2021-09-10: 4 mg via INTRAVENOUS
  Filled 2021-09-10: qty 1

## 2021-09-10 MED ORDER — ONDANSETRON HCL 4 MG/2ML IJ SOLN
4.0000 mg | Freq: Once | INTRAMUSCULAR | Status: AC
  Administered 2021-09-10: 4 mg via INTRAVENOUS
  Filled 2021-09-10: qty 2

## 2021-09-10 MED ORDER — ONDANSETRON HCL 4 MG PO TABS: 4.0000 mg | ORAL_TABLET | Freq: Four times a day (QID) | ORAL | 0 refills | Status: AC

## 2021-09-10 NOTE — ED Triage Notes (Addendum)
Patient presents from home with c/o chest pain that radiates into her shoulder blades. Patient has had this issue since being diagnosed with a PE, discharged 5/13, is currently on Eliquis. Per EMS report, family wants her admitted and to stay until she is better.   ? ?

## 2021-09-10 NOTE — ED Provider Notes (Signed)
[facility] DEPART[facility] Provider Note   CSN: [id number] Arrival date & time: 09/10/21  2135     History  Chief Complaint  Patient presents with   Chest Pain    Kristi Romero is a 66 y.o. female with unfortunate past medical history consistent of malignant carcinoid tumor of the ileum, chronic back pain, recent pulmonary embolism with respiratory failure just discharged from the hospital on the 13th, just evaluated 36 hours ago for ongoing chest pain with clear lab work and chest CT, additional history of hypertension, hyperlipidemia, anxiety, depression, neuropathy, chronic abdominal pain, who presents with ongoing chest pain, pain with inspiration, back pain.  Patient reports that these problems have been ongoing for around 2 days, which is within the timeframe that she was last evaluated.  She denies any new change, hemoptysis, need for oxygen.  She reports that she has a cousin at home as well as a nurse aide who helps to take care of her.  She reports that she has not tried anything for pain at this time.  She is still taking a blood thinner.   Chest Pain     Home Medications Prior to Admission medications   Medication Sig Start Date End Date Taking? Authorizing Provider  ondansetron (ZOFRAN) 4 MG tablet Take 1 tablet (4 mg total) by mouth every 6 (six) hours. 09/10/21  Yes Megha Agnes H, PA-C  acetaminophen (TYLENOL) 500 MG tablet Take 500 mg by mouth every 6 (six) hours as needed for mild pain.    [provider]  apixaban (ELIQUIS) 5 MG TABS tablet Take 2 tablets (10 mg total) by mouth 2 (two) times daily for 6 days. 09/07/21 09/13/21  Thurnell Lose, MD  apixaban (ELIQUIS) 5 MG TABS tablet Take 1 tablet (5 mg total) by mouth 2 (two) times daily. 09/14/21   Thurnell Lose, MD  cefdinir (OMNICEF) 300 MG capsule Take 300 mg by mouth See admin instructions. Bid x 12 days 09/02/21   [provider]  dicyclomine (BENTYL) 20 MG  tablet Take 20 mg by mouth 4 (four) times daily as needed for spasms.    [provider]  DULoxetine (CYMBALTA) 60 MG capsule Take 60 mg by mouth daily.    [provider]  ferrous sulfate 325 (65 FE) MG tablet Take 325 mg by mouth daily.    [provider]  gabapentin (NEURONTIN) 300 MG capsule Take 300 mg by mouth at bedtime. 05/09/21   [provider]  meclizine (ANTIVERT) 25 MG tablet Take 25 mg by mouth 4 (four) times daily as needed for dizziness. 02/12/16   [provider]  methocarbamol (ROBAXIN) 500 MG tablet Take 1 tablet (500 mg total) by mouth 2 (two) times daily. 09/09/21   Fatima Blank, MD  mirtazapine (REMERON) 15 MG tablet Take 15 mg by mouth at bedtime. 08/22/21   [provider]  ondansetron (ZOFRAN-ODT) 4 MG disintegrating tablet Take 4 mg by mouth 2 (two) times daily as needed for nausea or vomiting.    [provider]  pantoprazole (PROTONIX) 40 MG tablet Take 1 tablet (40 mg total) by mouth daily. 09/07/21   Thurnell Lose, MD      Allergies    Zestril [lisinopril] and Codeine    Review of Systems   Review of Systems  Cardiovascular:  Positive for chest pain.  All other systems reviewed and are negative.  Physical Exam Updated Vital Signs BP 119/65   Pulse 69  Temp 98.7 F (37.1 C) (Oral)   Resp 20   Ht '5\' 4"'$  (1.626 m)   Wt 69.9 kg   LMP  (LMP Unknown)   SpO2 99%   BMI 26.43 kg/m  Physical Exam Vitals and nursing note reviewed.  Constitutional:      General: She is not in acute distress.    Appearance: Normal appearance. She is ill-appearing.     Comments: Patient is chronically ill-appearing with no acute distress at this time  HENT:     Head: Normocephalic and atraumatic.  Eyes:     General:        Right eye: No discharge.        Left eye: No discharge.  Cardiovascular:     Rate and Rhythm: Normal rate and regular rhythm.     Heart sounds: No murmur heard.   No friction rub.  No gallop.  Pulmonary:     Effort: Pulmonary effort is normal.     Breath sounds: Normal breath sounds.     Comments: Normal breath sounds bilaterally with some pain with inspiration Chest:     Comments: Some tenderness to palpation of the chest wall, normal heart rate and rhythm. Abdominal:     General: Bowel sounds are normal.     Palpations: Abdomen is soft.  Skin:    General: Skin is warm and dry.     Capillary Refill: Capillary refill takes less than 2 seconds.  Neurological:     Mental Status: She is alert and oriented to person, place, and time.  Psychiatric:        Mood and Affect: Mood normal.        Behavior: Behavior normal.    ED Results / Procedures / Treatments   Labs (all labs ordered are listed, but only abnormal results are displayed) Labs Reviewed - No data to display  EKG EKG Interpretation  Date/Time:  Tuesday Sep 10 2021 21:44:02 EDT Ventricular Rate:  84 PR Interval:  128 QRS Duration: 76 QT Interval:  386 QTC Calculation: 457 R Axis:   34 Text Interpretation: Sinus rhythm Probable left atrial enlargement Confirmed by Orpah Greek [phone]) on 09/10/2021 11:13:22 PM  Radiology CT Angio Chest PE W and/or Wo Contrast  Result Date: 09/09/2021 CLINICAL DATA:  66 year old female with history of shortness of breath and chest pain. Evaluate for pulmonary embolism. EXAM: CT ANGIOGRAPHY CHEST WITH CONTRAST TECHNIQUE: Multidetector CT imaging of the chest was performed using the standard protocol during bolus administration of intravenous contrast. Multiplanar CT image reconstructions and MIPs were obtained to evaluate the vascular anatomy. RADIATION DOSE REDUCTION: This exam was performed according to the departmental dose-optimization program which includes automated exposure control, adjustment of the mA and/or kV according to patient size and/or use of iterative reconstruction technique. CONTRAST:  74m OMNIPAQUE IOHEXOL 300 MG/ML  SOLN COMPARISON:   Chest CT 09/06/2021. FINDINGS: Cardiovascular: There are no filling defects within the pulmonary arterial tree to suggest the presence of pulmonary embolism. Heart size is normal. Trace amount of pericardial fluid and/or thickening, adjacent to the left ventricle, without associated pericardial calcification, unlikely of hemodynamic significance at this time. Mediastinum/Nodes: No pathologically enlarged mediastinal or hilar lymph nodes. Hilar esophagus is unremarkable in appearance. No axillary lymphadenopathy. Lungs/Pleura: Extensive areas of passive subsegmental atelectasis are noted in the lower lobes of the lungs bilaterally. No acute consolidative airspace disease. No pleural effusions. No pneumothorax. No definite suspicious appearing pulmonary nodules or masses are noted. Upper Abdomen: Aortic atherosclerosis. 2.6  x 2.0 cm left adrenal nodule, unchanged, previously characterized as an adenoma. Musculoskeletal: There are no aggressive appearing lytic or blastic lesions noted in the visualized portions of the skeleton. Review of the MIP images confirms the above findings. IMPRESSION: 1. No evidence of pulmonary embolism. Previously noted nonocclusive thrombus in the right lower lobe has resolved since the recent prior examination. No new emboli are noted. 2. Trace volume of pericardial fluid, unlikely of any hemodynamic significance at this time. 3. Aortic atherosclerosis. 4. Left adrenal adenoma, unchanged. Aortic Atherosclerosis (ICD10-I70.0). Electronically Signed   By: Vinnie Langton M.D.   On: 09/09/2021 05:26    Procedures Procedures    Medications Ordered in ED Medications  morphine (PF) 4 MG/ML injection 4 mg (4 mg Intravenous Given 09/10/21 2240)  ondansetron (ZOFRAN) injection 4 mg (4 mg Intravenous Given 09/10/21 2257)  HYDROmorphone (DILAUDID) injection 0.5 mg (0.5 mg Intravenous Given 09/11/21 0007)    ED Course/ Medical Decision Making/ A&P                           Medical  Decision Making Risk Prescription drug management.   This patient is a 66 y.o. female who presents to the ED for concern of ongoing chest pain, back pain in the context of recent pulmonary embolism, with lab work, imaging showing resolved pulmonary embolism around 36 hours prior to arrival, this involves an extensive number of treatment options, and is a complaint that carries with it a high risk of complications and morbidity. The emergent differential diagnosis prior to evaluation includes, but is not limited to, low clinical suspicion for new acute ACS, pulmonary embolism, ACS, pneumonia, versus other, patient had unremarkable lab work and imaging with just over 1 day ago, however still consider these diagnoses.  Additionally considered that patient has some ongoing pleuritic chest pain secondary to her recent pulmonary embolism, lung damage, she has some back pain secondary to chronic back pain issues which are well-documented.  She reports that she is not trying anything for pain at this time..   This is not an exhaustive differential.   Past Medical History / Co-morbidities / Social History: malignant carcinoid tumor of the ileum, chronic back pain, recent pulmonary embolism with respiratory failure just discharged from the hospital on the 13th, just evaluated 36 hours ago for ongoing chest pain with clear lab work and chest CT, additional history of hypertension, hyperlipidemia, anxiety, depression, neuropathy, chronic abdominal pain  Additional history: Chart reviewed. Pertinent results include: Extensively reviewed lab work and imaging from recent emergency department visits, hospitalization for pulmonary embolism less than a week ago, as well as oncology, cardiology visits. Patient with resolution of previously noted PE on CTA yesterday, labwork with no significant new abnormality. Patient with no significant new complaint today.  Physical Exam: Physical exam performed. The pertinent  findings include: Patient endorses some pleuritic chest pain with no accessory breath sounds, she is some tenderness palpation of the chest wall, she has no other significant physical exam abnormalities at this time.  She is chronically ill-appearing at baseline.  Distress, with normal respiration, not acutely ill-appearing, with pain which is consistent with the pain she described yesterday, which has a viable explanation of her recent pulmonary embolism, lung injury with less than 1 week since hospitalization.   Cardiac Monitoring:  The patient was maintained on a cardiac monitor.  My attending physician Dr. Betsey Holiday viewed and interpreted the cardiac monitored which showed an underlying rhythm of:  Normal sinus rhythm   Medications: I ordered medication including morphine, Dilaudid for pain. Reevaluation of the patient after these medicines showed that the patient improved. I have reviewed the patients home medicines and have made adjustments as needed.  Patient does still have some pain ongoing, but we discussed that some of her pain is consistent with her chronic baseline back pain, and her chest pain is consistent with her recent diagnosis of pulmonary embolism.     Disposition: After consideration of the diagnostic results and the patients response to treatment, I feel that patient appears stable for discharge with follow-up with her primary care provider, think she will need tighter control of her pain, encouraged follow-up with pain management clinic.  Consider repeat lab work, imaging in context of ongoing chest pain, however patient had evaluation around 36 hours ago with negative repeat CTA, clearance of pulmonary embolism, lab work which showed no new abnormalities, she is clinically overall well-appearing today, no respiratory distress, and no new complaints since her last evaluation in the emergency department.  Consider admission for pain control, and further evaluation context of ongoing  pain, however patient is amenable to discharge at this time.  Urged Tylenol, Robaxin, discussion of pain management with PCP.  Patient encouraged to rest, return to normal activities as tolerated.   I discussed this case with my attending physician Dr. Betsey Holiday who cosigned this note including patient's presenting symptoms, physical exam, and planned diagnostics and interventions. Attending physician stated agreement with plan or made changes to plan which were implemented.    Final Clinical Impression(s) / ED Diagnoses Final diagnoses:  Chest pain, unspecified type  Chronic bilateral thoracic back pain    Rx / DC Orders ED Discharge Orders          Ordered    ondansetron (ZOFRAN) 4 MG tablet  Every 6 hours        09/10/21 2243              Jamond Neels, [place] H, PA-C 09/11/21 0110    Jeanell Sparrow, DO 09/12/21 0101

## 2021-09-11 ENCOUNTER — Other Ambulatory Visit: Payer: Self-pay | Admitting: Pharmacist

## 2021-09-11 DIAGNOSIS — R0781 Pleurodynia: Secondary | ICD-10-CM | POA: Diagnosis not present

## 2021-09-11 MED ORDER — HYDROMORPHONE HCL 1 MG/ML IJ SOLN
0.5000 mg | Freq: Once | INTRAMUSCULAR | Status: AC
  Administered 2021-09-11: 0.5 mg via INTRAVENOUS
  Filled 2021-09-11: qty 1

## 2021-09-11 NOTE — Discharge Instructions (Addendum)
Please take 1000 mg of Tylenol every 6 hours as needed for pain.  In addition you can use the muscle relaxant that you were prescribed yesterday up to twice daily. Based on some of the pain that you are describing I recommend that you follow-up with your primary care doctor, and have a discussion whether or not you need to be seen by a pain management clinic.  In the meantime I do expect that you will continue to have some chest pain, and some difficulty with pain with deep breathing secondary to the blood clot that was in your lungs less than 1 week ago.  It will take some time until this pain resolves, please rest, continue to take your blood thinner, and follow up with your PCP as soon as you are able to. ?

## 2021-10-02 ENCOUNTER — Encounter (HOSPITAL_COMMUNITY): Payer: Self-pay | Admitting: Emergency Medicine

## 2021-10-02 ENCOUNTER — Emergency Department (HOSPITAL_COMMUNITY)
Admission: EM | Admit: 2021-10-02 | Discharge: 2021-10-02 | Disposition: A | Discharge status: Home / Self Care | Payer: Medicare HMO | Attending: Emergency Medicine | Admitting: Emergency Medicine

## 2021-10-02 ENCOUNTER — Other Ambulatory Visit: Payer: Self-pay

## 2021-10-02 ENCOUNTER — Emergency Department (HOSPITAL_COMMUNITY): Payer: Medicare HMO

## 2021-10-02 ENCOUNTER — Other Ambulatory Visit: Payer: Self-pay | Admitting: Pharmacist

## 2021-10-02 DIAGNOSIS — R109 Unspecified abdominal pain: Secondary | ICD-10-CM | POA: Diagnosis present

## 2021-10-02 DIAGNOSIS — R0682 Tachypnea, not elsewhere classified: Secondary | ICD-10-CM | POA: Diagnosis not present

## 2021-10-02 DIAGNOSIS — R0602 Shortness of breath: Secondary | ICD-10-CM | POA: Diagnosis not present

## 2021-10-02 DIAGNOSIS — R000 Tachycardia, unspecified: Secondary | ICD-10-CM | POA: Diagnosis not present

## 2021-10-02 DIAGNOSIS — R1012 Left upper quadrant pain: Secondary | ICD-10-CM

## 2021-10-02 DIAGNOSIS — I10 Essential (primary) hypertension: Secondary | ICD-10-CM | POA: Diagnosis not present

## 2021-10-02 DIAGNOSIS — Z7901 Long term (current) use of anticoagulants: Secondary | ICD-10-CM | POA: Insufficient documentation

## 2021-10-02 DIAGNOSIS — K5900 Constipation, unspecified: Secondary | ICD-10-CM | POA: Insufficient documentation

## 2021-10-02 LAB — COMPREHENSIVE METABOLIC PANEL
ALT: 13 U/L (ref 0–44)
AST: 18 U/L (ref 15–41)
Albumin: 3.2 g/dL — ABNORMAL LOW (ref 3.5–5.0)
Alkaline Phosphatase: 98 U/L (ref 38–126)
Anion gap: 9 (ref 5–15)
BUN: 6 mg/dL — ABNORMAL LOW (ref 8–23)
CO2: 28 mmol/L (ref 22–32)
Calcium: 9.5 mg/dL (ref 8.9–10.3)
Chloride: 98 mmol/L (ref 98–111)
Creatinine, Ser: 0.76 mg/dL (ref 0.44–1.00)
GFR, Estimated: >60 mL/min (ref >60)
Glucose, Bld: 114 mg/dL — ABNORMAL HIGH (ref 70–99)
Potassium: 3.8 mmol/L (ref 3.5–5.1)
Sodium: 135 mmol/L (ref 135–145)
Total Bilirubin: 0.5 mg/dL (ref 0.3–1.2)
Total Protein: 8.4 g/dL — ABNORMAL HIGH (ref 6.5–8.1)

## 2021-10-02 LAB — URINALYSIS, ROUTINE W REFLEX MICROSCOPIC
Bilirubin Urine: NEGATIVE
Glucose, UA: NEGATIVE mg/dL
Hgb urine dipstick: NEGATIVE
Ketones, ur: NEGATIVE mg/dL
Leukocytes,Ua: NEGATIVE
Nitrite: NEGATIVE
Protein, ur: NEGATIVE mg/dL
Specific Gravity, Urine: 1.044 — ABNORMAL HIGH (ref 1.005–1.030)
pH: 6 (ref 5.0–8.0)

## 2021-10-02 LAB — CBC WITH DIFFERENTIAL/PLATELET
Abs Immature Granulocytes: 0.03 10*3/uL (ref 0.00–0.07)
Basophils Absolute: 0 10*3/uL (ref 0.0–0.1)
Basophils Relative: 0 %
Eosinophils Absolute: 0.1 10*3/uL (ref 0.0–0.5)
Eosinophils Relative: 1 %
HCT: 37.2 % (ref 36.0–46.0)
Hemoglobin: 11.8 g/dL — ABNORMAL LOW (ref 12.0–15.0)
Immature Granulocytes: 0 %
Lymphocytes Relative: 47 %
Lymphs Abs: 4.5 10*3/uL — ABNORMAL HIGH (ref 0.7–4.0)
MCH: 30.8 pg (ref 26.0–34.0)
MCHC: 31.7 g/dL (ref 30.0–36.0)
MCV: 97.1 fL (ref 80.0–100.0)
Monocytes Absolute: 0.8 10*3/uL (ref 0.1–1.0)
Monocytes Relative: 8 %
Neutro Abs: 4.2 10*3/uL (ref 1.7–7.7)
Neutrophils Relative %: 44 %
Platelets: 397 10*3/uL (ref 150–400)
RBC: 3.83 MIL/uL — ABNORMAL LOW (ref 3.87–5.11)
RDW: 13.7 % (ref 11.5–15.5)
WBC: 9.6 10*3/uL (ref 4.0–10.5)
nRBC: 0 % (ref 0.0–0.2)

## 2021-10-02 LAB — TROPONIN I (HIGH SENSITIVITY)
Troponin I (High Sensitivity): 7 ng/L (ref <18)
Troponin I (High Sensitivity): 5 ng/L (ref <18)

## 2021-10-02 LAB — BRAIN NATRIURETIC PEPTIDE: B Natriuretic Peptide: 15.6 pg/mL (ref 0.0–100.0)

## 2021-10-02 LAB — MAGNESIUM: Magnesium: 1.9 mg/dL (ref 1.7–2.4)

## 2021-10-02 LAB — LIPASE, BLOOD: Lipase: 28 U/L (ref 11–51)

## 2021-10-02 MED ORDER — HYDROMORPHONE HCL 1 MG/ML IJ SOLN
0.5000 mg | Freq: Once | INTRAMUSCULAR | Status: AC
  Administered 2021-10-02: 0.5 mg via INTRAVENOUS
  Filled 2021-10-02: qty 1

## 2021-10-02 MED ORDER — LACTATED RINGERS IV BOLUS: 500.0000 mL | Freq: Once | INTRAVENOUS | Status: DC

## 2021-10-02 MED ORDER — GABAPENTIN 300 MG PO CAPS
300.0000 mg | ORAL_CAPSULE | Freq: Once | ORAL | Status: AC
  Administered 2021-10-02: 300 mg via ORAL
  Filled 2021-10-02: qty 1

## 2021-10-02 MED ORDER — SORBITOL 70 % SOLN
960.0000 mL | TOPICAL_OIL | Freq: Once | ORAL | Status: AC
  Administered 2021-10-02: 960 mL via RECTAL
  Filled 2021-10-02: qty 473

## 2021-10-02 MED ORDER — KETOROLAC TROMETHAMINE 15 MG/ML IJ SOLN
15.0000 mg | Freq: Once | INTRAMUSCULAR | Status: AC
  Administered 2021-10-02: 15 mg via INTRAVENOUS
  Filled 2021-10-02: qty 1

## 2021-10-02 MED ORDER — IOHEXOL 350 MG/ML SOLN
100.0000 mL | Freq: Once | INTRAVENOUS | Status: AC | PRN
Start: 2021-10-02 — End: 2021-10-02
  Administered 2021-10-02: 100 mL via INTRAVENOUS

## 2021-10-02 MED ORDER — LACTATED RINGERS IV SOLN: INTRAVENOUS | Status: DC

## 2021-10-02 NOTE — ED Notes (Signed)
Patient assisted to remove bedpan. Patient reports resolution of pain and states that she is ready to go home.

## 2021-10-02 NOTE — ED Notes (Signed)
Pt states she is unable to provide a urine sample at this time.

## 2021-10-02 NOTE — ED Provider Triage Note (Signed)
Emergency Medicine Provider Triage Evaluation Note  Kristi Romero , a 66 y.o. female  was evaluated in triage.  Pt complains of epigastric abdominal pain with associated shortness of breath.  Denies fever, chills, nausea, vomiting.  Recent emergency room visit with stool impaction.  Last bowel movement around noon yesterday.  Review of Systems  Positive: As above Negative: As above  Physical Exam  BP 130/88   Pulse 85   Temp 98.4 F (36.9 C) (Oral)   Resp 20   LMP  (LMP Unknown)   SpO2 100%  Gen:   Awake, no distress   Resp:  Normal effort  MSK:   Moves extremities without difficulty  Other:    Medical Decision Making  Medically screening exam initiated at 4:06 AM.  Appropriate orders placed.  Ketura Tonkovich was informed that the remainder of the evaluation will be completed by another provider, this initial triage assessment does not replace that evaluation, and the importance of remaining in the ED until their evaluation is complete.    Evlyn Courier, PA-C 10/02/21 [phone]

## 2021-10-02 NOTE — ED Provider Notes (Signed)
[facility] EMERGENCY DEPARTMENT Provider Note   CSN: [id number] Arrival date & time: 10/02/21  0342     History  Chief Complaint  Patient presents with   Shortness of Breath    Kristi Romero is a 66 y.o. female.   Shortness of Breath Associated symptoms: abdominal pain and vomiting   Patient presenting for abdominal pain.  Her medical history includes carcinoid tumor of ileum, chronic pain, anxiety, depression, neuropathy, constipation, PE, HLD, HTN.  She is prescribed Eliquis.  Patient describes the locations of pain as epigastrium and left upper quadrant.  She has also had pain in the left lower back.  Onset of pain was midday yesterday.  It has been worsening since that time.  She did have some mild nausea which has resolved.  She has not had any vomiting.  Last bowel movement was yesterday afternoon.  Patient reports that she had similar pain and was seen in [facility] in the emergency department 2 days ago.  She did have constipation at that time.  In addition to her abdominal pain, patient has been feeling short of breath.  She denies any chest discomfort.    Home Medications Prior to Admission medications   Medication Sig Start Date End Date Taking? Authorizing Provider  acetaminophen (TYLENOL) 500 MG tablet Take 500 mg by mouth every 6 (six) hours as needed for mild pain.    [provider]  apixaban (ELIQUIS) 5 MG TABS tablet Take 2 tablets (10 mg total) by mouth 2 (two) times daily for 6 days. 09/07/21 09/13/21  Thurnell Lose, MD  apixaban (ELIQUIS) 5 MG TABS tablet Take 1 tablet (5 mg total) by mouth 2 (two) times daily. 09/14/21   Thurnell Lose, MD  cefdinir (OMNICEF) 300 MG capsule Take 300 mg by mouth See admin instructions. Bid x 12 days 09/02/21   [provider]  dicyclomine (BENTYL) 20 MG tablet Take 20 mg by mouth 4 (four) times daily as needed for spasms.    [provider]  DULoxetine (CYMBALTA) 60 MG capsule Take 60 mg by  mouth daily.    [provider]  ferrous sulfate 325 (65 FE) MG tablet Take 325 mg by mouth daily.    [provider]  gabapentin (NEURONTIN) 300 MG capsule Take 300 mg by mouth at bedtime. 05/09/21   [provider]  meclizine (ANTIVERT) 25 MG tablet Take 25 mg by mouth 4 (four) times daily as needed for dizziness. 02/12/16   [provider]  methocarbamol (ROBAXIN) 500 MG tablet Take 1 tablet (500 mg total) by mouth 2 (two) times daily. 09/09/21   Fatima Blank, MD  mirtazapine (REMERON) 15 MG tablet Take 15 mg by mouth at bedtime. 08/22/21   [provider]  ondansetron (ZOFRAN) 4 MG tablet Take 1 tablet (4 mg total) by mouth every 6 (six) hours. 09/10/21   Prosperi, Christian H, PA-C  ondansetron (ZOFRAN-ODT) 4 MG disintegrating tablet Take 4 mg by mouth 2 (two) times daily as needed for nausea or vomiting.    [provider]  pantoprazole (PROTONIX) 40 MG tablet Take 1 tablet (40 mg total) by mouth daily. 09/07/21   Thurnell Lose, MD      Allergies    Zestril [lisinopril] and Codeine    Review of Systems   Review of Systems  Respiratory:  Positive for shortness of breath.   Gastrointestinal:  Positive for abdominal pain and vomiting.  Musculoskeletal:  Positive for back pain.  All other systems reviewed and are negative.  Physical Exam Updated Vital Signs BP 129/69   Pulse (!) 59   Temp 98.4 F (36.9 C) (Oral)   Resp 17   Ht '5\' 4"'$  (1.626 m)   Wt 68.5 kg   LMP  (LMP Unknown)   SpO2 98%   BMI 25.92 kg/m  Physical Exam Vitals and nursing note reviewed.  Constitutional:      Appearance: She is well-developed and normal weight. She is not toxic-appearing or diaphoretic.  HENT:     Head: Normocephalic and atraumatic.     Mouth/Throat:     Mouth: Mucous membranes are moist.     Pharynx: Oropharynx is clear.  Eyes:     Conjunctiva/sclera: Conjunctivae normal.  Cardiovascular:     Rate and Rhythm: Regular rhythm.  Tachycardia present.     Heart sounds: No murmur heard. Pulmonary:     Effort: Pulmonary effort is normal. Tachypnea present. No accessory muscle usage or respiratory distress.     Breath sounds: Normal breath sounds. No decreased breath sounds, wheezing, rhonchi or rales.  Chest:     Chest wall: No tenderness.  Abdominal:     Palpations: Abdomen is soft.     Tenderness: There is abdominal tenderness (To deep palpation).  Musculoskeletal:        General: No swelling.     Cervical back: Normal range of motion and neck supple.     Right lower leg: No edema.     Left lower leg: No edema.  Skin:    General: Skin is warm and dry.     Capillary Refill: Capillary refill takes less than 2 seconds.     Coloration: Skin is not cyanotic or pale.  Neurological:     General: No focal deficit present.     Mental Status: She is alert and oriented to person, place, and time.  Psychiatric:        Mood and Affect: Mood normal.        Behavior: Behavior normal.    ED Results / Procedures / Treatments   Labs (all labs ordered are listed, but only abnormal results are displayed) Labs Reviewed  CBC WITH DIFFERENTIAL/PLATELET - Abnormal; Notable for the following components:      Result Value   RBC 3.83 (*)    Hemoglobin 11.8 (*)    Lymphs Abs 4.5 (*)    All other components within normal limits  COMPREHENSIVE METABOLIC PANEL - Abnormal; Notable for the following components:   Glucose, Bld 114 (*)    BUN 6 (*)    Total Protein 8.4 (*)    Albumin 3.2 (*)    All other components within normal limits  URINALYSIS, ROUTINE W REFLEX MICROSCOPIC - Abnormal; Notable for the following components:   APPearance HAZY (*)    Specific Gravity, Urine 1.044 (*)    All other components within normal limits  LIPASE, BLOOD  BRAIN NATRIURETIC PEPTIDE  MAGNESIUM  TROPONIN I (HIGH SENSITIVITY)  TROPONIN I (HIGH SENSITIVITY)    EKG EKG Interpretation  Date/Time:  Wednesday October 02 2021 06:05:14  EDT Ventricular Rate:  86 PR Interval:  136 QRS Duration: 91 QT Interval:  359 QTC Calculation: 430 R Axis:   40 Text Interpretation: Sinus rhythm Probable left atrial enlargement Confirmed by Godfrey Pick ([id number]) on 10/02/2021 7:16:22 AM  Radiology DG Chest 2 View  Result Date: 10/02/2021 CLINICAL DATA:  Chest pain, epigastric abdominal pain, and shortness of breath. EXAM: CHEST - 2 VIEW  COMPARISON:  09/22/2021. FINDINGS: The heart size and mediastinal contours are stable. There is atherosclerotic calcification of the aorta. Elevation of the left diaphragm is noted. Atelectasis is present at the lung bases bilaterally. No effusion or pneumothorax. No acute osseous abnormality. IMPRESSION: Mild atelectasis at the lung bases. Electronically Signed   By: Brett Fairy M.D.   On: 10/02/2021 04:34   CT Angio Chest PE W and/or Wo Contrast  Result Date: 10/02/2021 CLINICAL DATA:  Acute abdominal pain EXAM: CT ANGIOGRAPHY CHEST CT ABDOMEN AND PELVIS WITH CONTRAST TECHNIQUE: Multidetector CT imaging of the chest was performed using the standard protocol during bolus administration of intravenous contrast. Multiplanar CT image reconstructions and MIPs were obtained to evaluate the vascular anatomy. Multidetector CT imaging of the abdomen and pelvis was performed using the standard protocol during bolus administration of intravenous contrast. RADIATION DOSE REDUCTION: This exam was performed according to the departmental dose-optimization program which includes automated exposure control, adjustment of the mA and/or kV according to patient size and/or use of iterative reconstruction technique. CONTRAST:  141m OMNIPAQUE IOHEXOL 350 MG/ML SOLN COMPARISON:  None Available. FINDINGS: CTA CHEST FINDINGS Cardiovascular: Satisfactory opacification of the pulmonary arteries to the segmental level. No evidence of pulmonary embolism. Stable cardiomegaly. No pericardial effusion. Thoracic aortic atherosclerosis. Mildly dilated  main pulmonary artery measuring 3.3 cm in diameter. Mediastinum/Nodes: No enlarged mediastinal, hilar, or axillary lymph nodes. Trachea and esophagus demonstrate no significant findings. Ill-defined 16 mm left thyroid nodule [facility] with a recommendation of a 1 year follow-up. Lungs/Pleura: Bilateral interstitial thickening and patchy ground-glass opacities throughout the lungs. No pleural effusion, pneumothorax or focal consolidation. Musculoskeletal: No acute osseous abnormality. No aggressive osseous lesion. Review of the MIP images confirms the above findings. CT ABDOMEN and PELVIS FINDINGS Hepatobiliary: No focal liver abnormality is seen. Prior cholecystectomy. Pancreas: Unremarkable. No pancreatic ductal dilatation or surrounding inflammatory changes. Spleen: Normal in size without focal abnormality. Adrenals/Urinary Tract: 17 mm left adrenal mass unchanged compared with 02/25/2019 at which time the mass was most consistent with an adrenal adenoma. Kidneys are normal, without renal calculi, focal lesion, or hydronephrosis. Bladder is unremarkable. Stomach/Bowel: Stomach is within normal limits. No evidence of bowel wall thickening, distention, or inflammatory changes. Vascular/Lymphatic: Aortic atherosclerosis. No enlarged abdominal or pelvic lymph nodes. Reproductive: Status post hysterectomy. No adnexal masses. Other: Small fat containing bilateral inguinal hernias. No abdominopelvic ascites. Musculoskeletal: No acute osseous abnormality. No aggressive osseous lesion. Degenerative disease with disc height loss and facet arthropathy at L5-S1. Degenerative changes of the pubic symphysis. Review of the MIP images confirms the above findings. IMPRESSION: 1. No pulmonary embolism. 2. Bilateral interstitial thickening and patchy ground-glass opacities throughout the lungs, consistent with mild pulmonary edema. 3. Stable cardiomegaly. 4. Mildly  dilated main pulmonary artery, which can be seen in the setting of pulmonary arterial hypertension. 5. Ill-defined 16 mm left thyroid nodule [facility] with a recommendation of a 1 year follow-up. 6. No acute abdominal or pelvic abnormality. 7. Aortic Atherosclerosis (ICD10-I70.0). Electronically Signed   By: HKathreen DevoidM.D.   On: 10/02/2021 07:55   CT ABDOMEN PELVIS W CONTRAST  Result Date: 10/02/2021 CLINICAL DATA:  Acute abdominal pain EXAM: CT ANGIOGRAPHY CHEST CT ABDOMEN AND PELVIS WITH CONTRAST TECHNIQUE: Multidetector CT imaging of the chest was performed using the standard protocol during bolus administration of intravenous contrast. Multiplanar CT image reconstructions and MIPs were obtained to  evaluate the vascular anatomy. Multidetector CT imaging of the abdomen and pelvis was performed using the standard protocol during bolus administration of intravenous contrast. RADIATION DOSE REDUCTION: This exam was performed according to the departmental dose-optimization program which includes automated exposure control, adjustment of the mA and/or kV according to patient size and/or use of iterative reconstruction technique. CONTRAST:  175m OMNIPAQUE IOHEXOL 350 MG/ML SOLN COMPARISON:  None Available. FINDINGS: CTA CHEST FINDINGS Cardiovascular: Satisfactory opacification of the pulmonary arteries to the segmental level. No evidence of pulmonary embolism. Stable cardiomegaly. No pericardial effusion. Thoracic aortic atherosclerosis. Mildly dilated main pulmonary artery measuring 3.3 cm in diameter. Mediastinum/Nodes: No enlarged mediastinal, hilar, or axillary lymph nodes. Trachea and esophagus demonstrate no significant findings. Ill-defined 16 mm left thyroid nodule [facility] with a recommendation of a 1 year follow-up. Lungs/Pleura: Bilateral interstitial thickening and  patchy ground-glass opacities throughout the lungs. No pleural effusion, pneumothorax or focal consolidation. Musculoskeletal: No acute osseous abnormality. No aggressive osseous lesion. Review of the MIP images confirms the above findings. CT ABDOMEN and PELVIS FINDINGS Hepatobiliary: No focal liver abnormality is seen. Prior cholecystectomy. Pancreas: Unremarkable. No pancreatic ductal dilatation or surrounding inflammatory changes. Spleen: Normal in size without focal abnormality. Adrenals/Urinary Tract: 17 mm left adrenal mass unchanged compared with 02/25/2019 at which time the mass was most consistent with an adrenal adenoma. Kidneys are normal, without renal calculi, focal lesion, or hydronephrosis. Bladder is unremarkable. Stomach/Bowel: Stomach is within normal limits. No evidence of bowel wall thickening, distention, or inflammatory changes. Vascular/Lymphatic: Aortic atherosclerosis. No enlarged abdominal or pelvic lymph nodes. Reproductive: Status post hysterectomy. No adnexal masses. Other: Small fat containing bilateral inguinal hernias. No abdominopelvic ascites. Musculoskeletal: No acute osseous abnormality. No aggressive osseous lesion. Degenerative disease with disc height loss and facet arthropathy at L5-S1. Degenerative changes of the pubic symphysis. Review of the MIP images confirms the above findings. IMPRESSION: 1. No pulmonary embolism. 2. Bilateral interstitial thickening and patchy ground-glass opacities throughout the lungs, consistent with mild pulmonary edema. 3. Stable cardiomegaly. 4. Mildly dilated main pulmonary artery, which can be seen in the setting of pulmonary arterial hypertension. 5. Ill-defined 16 mm left thyroid nodule [facility] with a recommendation of a 1 year follow-up. 6. No acute abdominal or pelvic abnormality. 7. Aortic Atherosclerosis (ICD10-I70.0). Electronically Signed   By: HKathreen DevoidM.D.   On:  10/02/2021 07:55    Procedures Fecal disimpaction  Date/Time: 10/02/2021 9:53 AM Performed by: DGodfrey Pick MD Authorized by: DGodfrey Pick MD  Consent: Verbal consent obtained. Risks and benefits: risks, benefits and alternatives were discussed Consent given by: patient Patient understanding: patient states understanding of the procedure being performed Imaging studies: imaging studies available Patient identity confirmed: verbally with patient Local anesthesia used: no  Anesthesia: Local anesthesia used: no  Sedation: Patient sedated: no  Patient tolerance: patient tolerated the procedure well with no immediate complications      Medications Ordered in ED Medications  HYDROmorphone (DILAUDID) injection 0.5 mg (0.5 mg Intravenous Given 10/02/21 0811)  iohexol (OMNIPAQUE) 350 MG/ML injection 100 mL (100 mLs Intravenous Contrast Given 10/02/21 0740)  ketorolac (TORADOL) 15 MG/ML injection 15 mg (15 mg Intravenous Given 10/02/21 0917)  sorbitol, milk of mag, mineral oil, glycerin (SMOG) enema (960 mLs Rectal Given 10/02/21 1057)  gabapentin (NEURONTIN) capsule 300 mg (300 mg Oral Given 10/02/21 1015)    ED Course/ Medical Decision Making/ A&P  Medical Decision Making Amount and/or Complexity of Data Reviewed Labs: ordered. Radiology: ordered.  Risk Prescription drug management.   This patient presents to the ED for concern of abdominal pain, this involves an extensive number of treatment options, and is a complaint that carries with it a high risk of complications and morbidity.  The differential diagnosis includes constipation, colitis, mesenteric ischemia, perforated viscus, pneumonia, nephrolithiasis, recurrence of cancer   Co morbidities that complicate the patient evaluation  carcinoid tumor of ileum, chronic pain, anxiety, depression, neuropathy, constipation, PE, HLD, HTN   Additional history obtained:  Additional history obtained from  N/A External records from outside source obtained and reviewed including EMR   Lab Tests:  I Ordered, and personally interpreted labs.  The pertinent results include: Normal electrolytes, normal BNP, normal troponin, no leukocytosis, baseline anemia   Imaging Studies ordered:  I ordered imaging studies including CTA chest and CT of abdomen and pelvis I independently visualized and interpreted imaging which showed no acute findings I agree with the radiologist interpretation   Cardiac Monitoring: / EKG:  The patient was maintained on a cardiac monitor.  I personally viewed and interpreted the cardiac monitored which showed an underlying rhythm of: Sinus rhythm   Problem List / ED Course / Critical interventions / Medication management  Patient is a 66 year old female presenting for left-sided abdominal pain.  She states that the onset of pain was yesterday at midday.  Pain has progressed since that time.  On arrival in the ED, patient has normal vital signs.  On initial assessment, patient appears quite uncomfortable.  She does have tenderness to deep palpation in her abdomen.  She also describes shortness of breath.  SPO2 is normal on room air and her breathing is unlabored.  Tachypnea likely secondary to pain and discomfort.  Dilaudid was given for analgesia.  Laboratory work-up was initiated and patient underwent CT scanning of chest, abdomen, and pelvis.  Although there is some evidence of pulmonary edema on CTA chest, patient has a normal BNP.  She does not describe any recent infectious symptoms to suggest a multifocal pneumonia.  Toradol was given for continued analgesia and patient had significant relief of her pain.  Following pain control, patient's breathing is regular rate and unlabored.  Lungs remain clear to auscultation.  CT scan of abdomen pelvis is without acute findings.  I reviewed the EMR and patient was seen at outside hospital 2 days ago.  At that time, she reported that  she had not had a bowel movement in 2 weeks.  She underwent manual disimpaction and enema.  She was prescribed MiraLAX and advised to take it every 4 hours.  She states that she has been taking it at home.  She had 1 small bowel movement yesterday but has otherwise not had significant stool output.  She describes her bowel movement yesterday is quite firm.  I suspect that her ongoing abdominal discomfort is secondary to continued constipation.  She did state that she stopped taking her hydrocodone to relieve her constipation.  This was 2 days ago and she may be suffering from some opiate withdrawal as well.  Patient also states that she stopped taking her gabapentin at home and has since had worsening symptoms of neuropathy.  Dose of gabapentin was given in the ED.  Patient was advised that she can resume taking her daily gabapentin.  Patient was offered aggressive management of constipation here in the ED.  She was in favor of this.  On manual  disimpaction, 1 firm stool ball was removed.  Smog enema was ordered.  Following enema, patient had more stool output as well as continued improved abdominal pain.  She was advised to continue MiraLAX at home and to follow-up with PCP.  He was also encouraged to return to the ED for any worsening symptoms.  She was discharged in stable condition. I ordered medication including Dilaudid and Toradol for analgesia; gabapentin for neuropathy; smog enema for constipation Reevaluation of the patient after these medicines showed that the patient improved I have reviewed the patients home medicines and have made adjustments as needed   Social Determinants of Health:  Has access to outpatient care         Final Clinical Impression(s) / ED Diagnoses Final diagnoses:  Left upper quadrant abdominal pain  Constipation, unspecified constipation type    Rx / DC Orders ED Discharge Orders     None         Godfrey Pick, MD 10/02/21 1239

## 2021-10-02 NOTE — Discharge Instructions (Signed)
Continue MiraLAX for further bowel cleanout.  You can resume taking your gabapentin, as this is unlikely to contribute to constipation and will help with your neuropathy symptoms.  Follow-up with your primary doctor to discuss long-term medications.  Return to the ED for any new or worsening symptoms.

## 2021-10-02 NOTE — ED Notes (Signed)
PT can't urinate at time, has label specimen cup

## 2021-10-02 NOTE — ED Triage Notes (Signed)
Patient arrived with EMS from home reports SOB /chest tightness onset yesterday , no cough or fever .

## 2021-10-03 ENCOUNTER — Encounter: Payer: Self-pay | Admitting: Hematology and Oncology

## 2021-10-03 ENCOUNTER — Inpatient Hospital Stay: Payer: Medicare HMO | Attending: Oncology | Admitting: Hematology and Oncology

## 2021-10-03 ENCOUNTER — Inpatient Hospital Stay: Payer: Medicare HMO

## 2021-10-03 ENCOUNTER — Telehealth: Payer: Self-pay | Admitting: Hematology and Oncology

## 2021-10-03 ENCOUNTER — Other Ambulatory Visit: Payer: Self-pay

## 2021-10-03 VITALS — BP 100/62 | HR 91 | Temp 98.1°F | Resp 20 | Wt 150.1 lb

## 2021-10-03 DIAGNOSIS — K5900 Constipation, unspecified: Secondary | ICD-10-CM | POA: Insufficient documentation

## 2021-10-03 DIAGNOSIS — C7A012 Malignant carcinoid tumor of the ileum: Secondary | ICD-10-CM

## 2021-10-03 DIAGNOSIS — Z79899 Other long term (current) drug therapy: Secondary | ICD-10-CM | POA: Diagnosis not present

## 2021-10-03 LAB — HEPATIC FUNCTION PANEL
ALT: 17 U/L (ref 7–35)
AST: 29 (ref 13–35)
Alkaline Phosphatase: 115 (ref 25–125)
Bilirubin, Total: 0.5

## 2021-10-03 LAB — CBC AND DIFFERENTIAL
HCT: 36 (ref 36–46)
Hemoglobin: 11.6 — AB (ref 12.0–16.0)
Neutrophils Absolute: 5.78
Platelets: 434 10*3/uL — AB (ref 150–400)
WBC: 10.5

## 2021-10-03 LAB — BASIC METABOLIC PANEL
BUN: 10 (ref 4–21)
CO2: 31 — AB (ref 13–22)
Chloride: 100 (ref 99–108)
Creatinine: 0.7 (ref 0.5–1.1)
Glucose: 93
Potassium: 3.9 mEq/L (ref 3.5–5.1)
Sodium: 139 (ref 137–147)

## 2021-10-03 LAB — COMPREHENSIVE METABOLIC PANEL
Albumin: 4.2 (ref 3.5–5.0)
Calcium: 9.4 (ref 8.7–10.7)

## 2021-10-03 LAB — CBC: RBC: 3.8 — AB (ref 3.87–5.11)

## 2021-10-03 MED ORDER — OCTREOTIDE ACETATE 20 MG IM KIT
20.0000 mg | PACK | Freq: Once | INTRAMUSCULAR | Status: AC
  Administered 2021-10-03: 20 mg via INTRAMUSCULAR
  Filled 2021-10-03: qty 1

## 2021-10-03 NOTE — Assessment & Plan Note (Signed)
History of neuroendocrine/carcinoid tumor of the ascending colon, diagnosed in October 2016, treated with surgical resection. Seven of eighteen lymph nodes were involved (7/18). Primary tumor measured 2.2 x 1.7 x 1.5 cm, with three apparent vascular metastases including a nodule up to 3 cm with metastatic tumor present within 1 mm of the inked radial margin. In the distal appendix there was also a separate 3 mm carcinoid tumor. This was a staged as a pT3pN1 with low mitotic rate (0 per 10 high powered fields). [name] was sporadic. She continues with monthly octreotide. Recent MRI of the spine reveals progressive disc space narrowing at L5-S1 with unchanged mild bilateral neural foraminal stenosis; unchanged mild lateral recess and neural foraminal stenosis at L3-4 and L4-5. She has had multiple hospitalizations since her last visit, mainly for abdominal pain which has been related to constipation. Her most recent evaluation was yesterday in the ED where she underwent enemas and disimpaction. She reports feeling some better today. Imaging obtained during these evaluations were unremarkable. She is currently taking miralax every 4 hours. She has run out of her oral iron and stopped taking that. I advised that her hemoglobin today is 11.6 with normal MCV, so we will continue to hold iron until her constipation is better managed. She will proceed with octreotide this week and return to clinic in 4 weeks for repeat evaluation.

## 2021-10-03 NOTE — Patient Instructions (Signed)
Octreotide injection solution ?What is this medication? ?OCTREOTIDE (ok TREE oh tide) is used to reduce blood levels of growth hormone in patients with a condition called acromegaly. This medicine also reduces flushing and watery diarrhea caused by certain types of cancer. ?This medicine may be used for other purposes; ask your health care provider or pharmacist if you have questions. ?COMMON BRAND NAME(S): Bynfezia, Sandostatin ?What should I tell my care team before I take this medication? ?They need to know if you have any of these conditions: ?diabetes ?gallbladder disease ?kidney disease ?liver disease ?thyroid disease ?an unusual or allergic reaction to octreotide, other medicines, foods, dyes, or preservatives ?pregnant or trying to get pregnant ?breast-feeding ?How should I use this medication? ?This medication is injected under the skin or into a vein. It is usually given by your care team in a hospital or clinic setting. ?If you get this medication at home, you will be taught how to prepare and give it. Use exactly as directed. Take it as directed on the prescription label at the same time every day. Keep taking it unless your care team tells you to stop. ?Allow the injection solution to come to room temperature before use. Do not warm it artificially. ?It is important that you put your used needles and syringes in a special sharps container. Do not put them in a trash can. If you do not have a sharps container, call your pharmacist or care team to get one. ?Talk to your care team about the use of this medication in children. Special care may be needed. ?Overdosage: If you think you have taken too much of this medicine contact a poison control center or emergency room at once. ?NOTE: This medicine is only for you. Do not share this medicine with others. ?What if I miss a dose? ?If you miss a dose, take it as soon as you can. If it is almost time for your next dose, take only that dose. Do not take double  or extra doses. ?What may interact with this medication? ?bromocriptine ?certain medicines for blood pressure, heart disease, irregular heartbeat ?cyclosporine ?diuretics ?medicines for diabetes, including insulin ?quinidine ?This list may not describe all possible interactions. Give your health care provider a list of all the medicines, herbs, non-prescription drugs, or dietary supplements you use. Also tell them if you smoke, drink alcohol, or use illegal drugs. Some items may interact with your medicine. ?What should I watch for while using this medication? ?Visit your care team for regular checks on your progress. Tell your care team if your symptoms do not start to get better or if they get worse. ?To help reduce irritation at the injection site, use a different site for each injection and make sure the solution is at room temperature before use. ?This medication may cause decreases in blood sugar. Signs of low blood sugar include chills, cool, pale skin or cold sweats, drowsiness, extreme hunger, fast heartbeat, headache, nausea, nervousness or anxiety, shakiness, trembling, unsteadiness, tiredness, or weakness. Contact your care team right away if you experience any of these symptoms. ?This medication may increase blood sugar. The risk may be higher in patients who already have diabetes. Ask your care team what you can do to lower your risk of diabetes while taking this medication. ?You should make sure you get enough vitamin B12 while you are taking this medication. Discuss the foods you eat and the vitamins you take with your care team. ?What side effects may I notice from receiving   this medication? ?Side effects that you should report to your doctor or health care professional as soon as possible: ?allergic reactions like skin rash, itching or hives, swelling of the face, lips, or tongue ?fast, slow, or irregular heartbeat ?right upper belly pain ?severe stomach pain ?signs and symptoms of high blood sugar  such as being more thirsty or hungry or having to urinate more than normal. You may also feel very tired or have blurry vision. ?signs and symptoms of low blood sugar such as feeling anxious; confusion; dizziness; increased hunger; unusually weak or tired; increased sweating; shakiness; cold, clammy skin; irritable; headache; blurred vision; fast heartbeat; loss of consciousness ?unusually weak or tired ?Side effects that usually do not require medical attention (report to your doctor or health care professional if they continue or are bothersome): ?diarrhea ?dizziness ?gas ?headache ?nausea, vomiting ?pain, redness, or irritation at site where injected ?upset stomach ?This list may not describe all possible side effects. Call your doctor for medical advice about side effects. You may report side effects to FDA at 1-800-FDA-1088. ?Where should I keep my medication? ?Keep out of the reach of children and pets. ?Store in the refrigerator. Protect from light. Allow to come to room temperature naturally. Do not use artificial heat. If protected from light, the injection may be stored between 20 and 30 degrees C (70 and 86 degrees F) for 14 days. After the initial use, throw away any unused portion of a multiple dose vial after 14 days. Get rid of any unused portions of the ampules after use. ?To get rid of medications that are no longer needed or have expired: ?Take the medication to a medication take-back program. Ask your pharmacy or law enforcement to find a location. ?If you cannot return the medication, ask your pharmacist or care team how to get rid of the medication safely. ?NOTE: This sheet is a summary. It may not cover all possible information. If you have questions about this medicine, talk to your doctor, pharmacist, or health care provider. ?? 2023 [employer] (2021-04-05 00:00:00) ? ?

## 2021-10-03 NOTE — Telephone Encounter (Signed)
Per 10/03/21 los next appt scheduled and confirmed with patient 

## 2021-10-03 NOTE — Progress Notes (Signed)
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  Seven of eighteen lymph nodes were involved (7/18).  Primary tumor measured 2.2 x 1.7 x 1.5 cm, with three apparent vascular metastases including a nodule up to 3 cm with metastatic tumor present within 1 mm of the inked radial margin.  In the distal appendix there was also a separate 3 mm carcinoid tumor.  This was a staged as a pT3pN1 with low mitotic rate (0 per 10 high powered fields).  [name] was sporadic. She continues with monthly octreotide. Recent MRI of the spine reveals progressive disc space narrowing at L5-S1 with unchanged mild bilateral neural foraminal stenosis; unchanged mild lateral recess and neural foraminal stenosis at L3-4 and L4-5. She has had multiple hospitalizations since her last visit, mainly for abdominal pain which has been related to constipation. Her most recent evaluation was yesterday in the ED where she underwent enemas and disimpaction. She reports feeling some better today. Imaging obtained during these evaluations were unremarkable. She is currently taking miralax every 4 hours. She has run out of her oral iron and stopped taking that. I advised that her hemoglobin today is 11.6 with normal MCV, so we will continue to hold iron until her constipation is better managed. She will proceed with octreotide this week and return to clinic in 4 weeks for repeat evaluation.   The patient understands the plans discussed today and is in agreement with them.  She knows to  contact our office if she develops concerns prior to her next appointment.    [name], NP  [facility] [address] [place] [address] Dept: [phone] Dept Fax: [phone]   Orders Placed This Encounter  Procedures   CBC and differential    This external order was created through the Results Console.   CBC    This external order was created through the Results Console.   Basic metabolic panel    This external order was created through the Results Console.   Comprehensive metabolic panel    This external order was created through the Results Console.   Hepatic function panel    This external order was created through the Results Console.      CHIEF COMPLAINT:  CC: A 66 year old female with history of malignant carcinoid tumor of ileum  Current Treatment:  Octreotide  INTERVAL HISTORY:  Kristi Romero is here today for repeat clinical assessment. She denies fevers or chills. She denies pain. Her appetite is good. Her weight has been stable.  I have reviewed the past medical history, past surgical history, social history and family history with the patient and they are unchanged from previous note.  ALLERGIES:  is allergic to atorvastatin, codeine, and zestril [lisinopril].  MEDICATIONS:  Current Outpatient Medications  Medication Sig Dispense Refill   polyethylene glycol powder (GLYCOLAX/MIRALAX) 17 GM/SCOOP powder Take by mouth.     acetaminophen (TYLENOL) 500 MG tablet Take 500 mg by mouth every 6 (six) hours as needed for mild pain.     apixaban (ELIQUIS) 5 MG  TABS tablet Take 1 tablet (5 mg total) by mouth 2 (two) times daily. 60 tablet 0   dicyclomine (BENTYL) 20 MG tablet Take 20 mg by mouth 4 (four) times daily as needed for spasms.     DULoxetine (CYMBALTA) 60 MG capsule Take 60 mg by mouth daily.     gabapentin (NEURONTIN) 300 MG capsule Take 300 mg by mouth at bedtime.      HYDROcodone-acetaminophen (NORCO/VICODIN) 5-325 MG tablet Take by mouth.     meclizine (ANTIVERT) 25 MG tablet Take 25 mg by mouth 4 (four) times daily as needed for dizziness.     morphine (MSIR) 15 MG tablet Take by mouth.     ondansetron (ZOFRAN) 4 MG tablet Take 1 tablet (4 mg total) by mouth every 6 (six) hours. 12 tablet 0   ondansetron (ZOFRAN-ODT) 4 MG disintegrating tablet Take 4 mg by mouth 2 (two) times daily as needed for nausea or vomiting.     No current facility-administered medications for this visit.    HISTORY OF PRESENT ILLNESS:   Oncology History  Malignant carcinoid tumor of ileum ([place])  04/28/2014 Initial Diagnosis   Malignant carcinoid tumor of ileum ([place] [facility])   02/02/2015 Cancer Staging   Staging form: Neuroendocrine Tumor - Duodenum/Ampulla/Jejunum/Illeum, AJCC 7th Edition - Clinical stage from 02/02/2015: Stage IIIB (T3(3), N1, M0) - Signed by Derwood Kaplan, MD on 05/28/2020 Staged by: Managing physician Diagnostic confirmation: Positive histology Specimen type: Excision Histopathologic type: Carcinoid tumor, NOS Stage prefix: Initial diagnosis Tumor size (mm): 22 Multiple tumors: Yes Number of tumors: 3 Histologic grade (G): G1 Lymph-vascular invasion (LVI): LVI not present (absent)/not identified Residual tumor (R): R0 - None Stage used in treatment planning: Yes National guidelines used in treatment planning: Yes Type of national guideline used in treatment planning: NCCN Staging comments: Surgical resection       REVIEW OF SYSTEMS:   Constitutional: Denies fevers, chills or abnormal weight loss Eyes: Denies blurriness of vision Ears, nose, mouth, throat, and face: Denies mucositis or sore throat Respiratory: Denies cough, dyspnea or wheezes Cardiovascular: Denies palpitation, chest discomfort or lower extremity swelling Gastrointestinal:  Denies nausea, heartburn or change in bowel habits Skin: Denies abnormal skin rashes Lymphatics: Denies  new lymphadenopathy or easy bruising Neurological:Denies numbness, tingling or new weaknesses Behavioral/Psych: Mood is stable, no new changes  All other systems were reviewed with the patient and are negative.   VITALS:  Blood pressure 128/73, pulse 88, temperature 99.1 F (37.3 C), temperature source Oral, resp. rate 20, height '5\' 4"'$  (1.626 m), weight 149 lb 14.4 oz (68 kg), SpO2 93 %.  Wt Readings from Last 3 Encounters:  10/03/21 149 lb 14.4 oz (68 kg)  10/02/21 151 lb (68.5 kg)  09/10/21 154 lb (69.9 kg)    Body mass index is 25.73 kg/m.  Performance status (ECOG): 1 - Symptomatic but completely ambulatory  PHYSICAL EXAM:   GENERAL:alert, no distress and comfortable SKIN: skin color, texture, turgor are normal, no rashes or significant lesions EYES: normal, Conjunctiva are pink and non-injected, sclera clear OROPHARYNX:no exudate, no erythema and lips, buccal mucosa, and tongue normal  NECK: supple, thyroid normal size, non-tender, without nodularity LYMPH:  no palpable lymphadenopathy in the cervical, axillary or inguinal LUNGS: clear to auscultation and percussion with normal breathing effort HEART: regular rate & rhythm and no murmurs and no lower extremity edema ABDOMEN:abdomen soft, non-tender and normal bowel sounds Musculoskeletal:no cyanosis of digits and no clubbing  NEURO: alert & oriented x 3 with fluent  speech, no focal motor/sensory deficits  LABORATORY DATA:  I have reviewed the data as listed    Component Value Date/Time   NA 139 10/03/2021 0000   K 3.9 10/03/2021 0000   CL 100 10/03/2021 0000   CO2 31 (A) 10/03/2021 0000   GLUCOSE 114 (H) 10/02/2021 0408   BUN 10 10/03/2021 0000   CREATININE 0.7 10/03/2021 0000   CREATININE 0.76 10/02/2021 0408   CALCIUM 9.4 10/03/2021 0000   PROT 8.4 (H) 10/02/2021 0408   PROT 8.6 (A) 03/11/2021 0000   ALBUMIN 4.2 10/03/2021 0000   AST 29 10/03/2021 0000   ALT 17 10/03/2021 0000   ALKPHOS 115 10/03/2021 0000    BILITOT 0.5 10/02/2021 0408   GFRNONAA >60 10/02/2021 0408   GFRAA >60 03/19/2018 1206    No results found for: "SPEP", "UPEP"  Lab Results  Component Value Date   WBC 10.5 10/03/2021   NEUTROABS 5.78 10/03/2021   HGB 11.6 (A) 10/03/2021   HCT 36 10/03/2021   MCV 97.1 10/02/2021   PLT 434 (A) 10/03/2021      Chemistry      Component Value Date/Time   NA 139 10/03/2021 0000   K 3.9 10/03/2021 0000   CL 100 10/03/2021 0000   CO2 31 (A) 10/03/2021 0000   BUN 10 10/03/2021 0000   CREATININE 0.7 10/03/2021 0000   CREATININE 0.76 10/02/2021 0408   GLU 93 10/03/2021 0000      Component Value Date/Time   CALCIUM 9.4 10/03/2021 0000   ALKPHOS 115 10/03/2021 0000   AST 29 10/03/2021 0000   ALT 17 10/03/2021 0000   BILITOT 0.5 10/02/2021 0408       RADIOGRAPHIC STUDIES: I have personally reviewed the radiological images as listed and agreed with the findings in the report. CT ABDOMEN PELVIS W CONTRAST  Result Date: 10/02/2021 CLINICAL DATA:  Acute abdominal pain EXAM: CT ANGIOGRAPHY CHEST CT ABDOMEN AND PELVIS WITH CONTRAST TECHNIQUE: Multidetector CT imaging of the chest was performed using the standard protocol during bolus administration of intravenous contrast. Multiplanar CT image reconstructions and MIPs were obtained to evaluate the vascular anatomy. Multidetector CT imaging of the abdomen and pelvis was performed using the standard protocol during bolus administration of intravenous contrast. RADIATION DOSE REDUCTION: This exam was performed according to the departmental dose-optimization program which includes automated exposure control, adjustment of the mA and/or kV according to patient size and/or use of iterative reconstruction technique. CONTRAST:  118m OMNIPAQUE IOHEXOL 350 MG/ML SOLN COMPARISON:  None Available. FINDINGS: CTA CHEST FINDINGS Cardiovascular: Satisfactory opacification of the pulmonary arteries to the segmental level. No evidence of pulmonary embolism.  Stable cardiomegaly. No pericardial effusion. Thoracic aortic atherosclerosis. Mildly dilated main pulmonary artery measuring 3.3 cm in diameter. Mediastinum/Nodes: No enlarged mediastinal, hilar, or axillary lymph nodes. Trachea and esophagus demonstrate no significant findings. Ill-defined 16 mm left thyroid nodule [facility] with a recommendation of a 1 year follow-up. Lungs/Pleura: Bilateral interstitial thickening and patchy ground-glass opacities throughout the lungs. No pleural effusion, pneumothorax or focal consolidation. Musculoskeletal: No acute osseous abnormality. No aggressive osseous lesion. Review of the MIP images confirms the above findings. CT ABDOMEN and PELVIS FINDINGS Hepatobiliary: No focal liver abnormality is seen. Prior cholecystectomy. Pancreas: Unremarkable. No pancreatic ductal dilatation or surrounding inflammatory changes. Spleen: Normal in size without focal abnormality. Adrenals/Urinary Tract: 17 mm left adrenal mass unchanged compared with 02/25/2019 at which time the mass was most consistent with an  adrenal adenoma. Kidneys are normal, without renal calculi, focal lesion, or hydronephrosis. Bladder is unremarkable. Stomach/Bowel: Stomach is within normal limits. No evidence of bowel wall thickening, distention, or inflammatory changes. Vascular/Lymphatic: Aortic atherosclerosis. No enlarged abdominal or pelvic lymph nodes. Reproductive: Status post hysterectomy. No adnexal masses. Other: Small fat containing bilateral inguinal hernias. No abdominopelvic ascites. Musculoskeletal: No acute osseous abnormality. No aggressive osseous lesion. Degenerative disease with disc height loss and facet arthropathy at L5-S1. Degenerative changes of the pubic symphysis. Review of the MIP images confirms the above findings. IMPRESSION: 1. No pulmonary embolism. 2. Bilateral interstitial thickening and patchy ground-glass opacities  throughout the lungs, consistent with mild pulmonary edema. 3. Stable cardiomegaly. 4. Mildly dilated main pulmonary artery, which can be seen in the setting of pulmonary arterial hypertension. 5. Ill-defined 16 mm left thyroid nodule has already been evaluated by ultrasound at [facility] on 08/30/2021 with a recommendation of a 1 year follow-up. 6. No acute abdominal or pelvic abnormality. 7. Aortic Atherosclerosis (ICD10-I70.0). Electronically Signed   By: Kathreen Devoid M.D.   On: 10/02/2021 07:55   CT Angio Chest PE W and/or Wo Contrast  Result Date: 10/02/2021 CLINICAL DATA:  Acute abdominal pain EXAM: CT ANGIOGRAPHY CHEST CT ABDOMEN AND PELVIS WITH CONTRAST TECHNIQUE: Multidetector CT imaging of the chest was performed using the standard protocol during bolus administration of intravenous contrast. Multiplanar CT image reconstructions and MIPs were obtained to evaluate the vascular anatomy. Multidetector CT imaging of the abdomen and pelvis was performed using the standard protocol during bolus administration of intravenous contrast. RADIATION DOSE REDUCTION: This exam was performed according to the departmental dose-optimization program which includes automated exposure control, adjustment of the mA and/or kV according to patient size and/or use of iterative reconstruction technique. CONTRAST:  1109m OMNIPAQUE IOHEXOL 350 MG/ML SOLN COMPARISON:  None Available. FINDINGS: CTA CHEST FINDINGS Cardiovascular: Satisfactory opacification of the pulmonary arteries to the segmental level. No evidence of pulmonary embolism. Stable cardiomegaly. No pericardial effusion. Thoracic aortic atherosclerosis. Mildly dilated main pulmonary artery measuring 3.3 cm in diameter. Mediastinum/Nodes: No enlarged mediastinal, hilar, or axillary lymph nodes. Trachea and esophagus demonstrate no significant findings. Ill-defined 16 mm left thyroid nodule [facility] with a recommendation of a 1 year follow-up. Lungs/Pleura: Bilateral interstitial thickening and patchy ground-glass opacities throughout the lungs. No pleural effusion, pneumothorax or focal consolidation. Musculoskeletal: No acute osseous abnormality. No aggressive osseous lesion. Review of the MIP images confirms the above findings. CT ABDOMEN and PELVIS FINDINGS Hepatobiliary: No focal liver abnormality is seen. Prior cholecystectomy. Pancreas: Unremarkable. No pancreatic ductal dilatation or surrounding inflammatory changes. Spleen: Normal in size without focal abnormality. Adrenals/Urinary Tract: 17 mm left adrenal mass unchanged compared with 02/25/2019 at which time the mass was most consistent with an adrenal adenoma. Kidneys are normal, without renal calculi, focal lesion, or hydronephrosis. Bladder is unremarkable. Stomach/Bowel: Stomach is within normal limits. No evidence of bowel wall thickening, distention, or inflammatory changes. Vascular/Lymphatic: Aortic atherosclerosis. No enlarged abdominal or pelvic lymph nodes. Reproductive: Status post hysterectomy. No adnexal masses. Other: Small fat containing bilateral inguinal hernias. No abdominopelvic ascites. Musculoskeletal: No acute osseous abnormality. No aggressive osseous lesion. Degenerative disease with disc height loss and facet arthropathy at L5-S1. Degenerative changes of the pubic symphysis. Review of the MIP images confirms the above findings. IMPRESSION: 1. No pulmonary embolism. 2. Bilateral interstitial thickening and patchy ground-glass opacities throughout the lungs, consistent with mild pulmonary edema. 3. Stable cardiomegaly. 4.  Mildly dilated main pulmonary artery, which can be seen in the setting of pulmonary arterial hypertension. 5. Ill-defined 16 mm left thyroid nodule has already been evaluated by ultrasound at [facility] on 08/30/2021 with a recommendation of a 1 year follow-up. 6. No acute abdominal or  pelvic abnormality. 7. Aortic Atherosclerosis (ICD10-I70.0). Electronically Signed   By: Kathreen Devoid M.D.   On: 10/02/2021 07:55   DG Chest 2 View  Result Date: 10/02/2021 CLINICAL DATA:  Chest pain, epigastric abdominal pain, and shortness of breath. EXAM: CHEST - 2 VIEW COMPARISON:  09/22/2021. FINDINGS: The heart size and mediastinal contours are stable. There is atherosclerotic calcification of the aorta. Elevation of the left diaphragm is noted. Atelectasis is present at the lung bases bilaterally. No effusion or pneumothorax. No acute osseous abnormality. IMPRESSION: Mild atelectasis at the lung bases. Electronically Signed   By: Brett Fairy M.D.   On: 10/02/2021 04:34   CT Angio Chest PE W and/or Wo Contrast  Result Date: 09/09/2021 CLINICAL DATA:  66 year old female with history of shortness of breath and chest pain. Evaluate for pulmonary embolism. EXAM: CT ANGIOGRAPHY CHEST WITH CONTRAST TECHNIQUE: Multidetector CT imaging of the chest was performed using the standard protocol during bolus administration of intravenous contrast. Multiplanar CT image reconstructions and MIPs were obtained to evaluate the vascular anatomy. RADIATION DOSE REDUCTION: This exam was performed according to the departmental dose-optimization program which includes automated exposure control, adjustment of the mA and/or kV according to patient size and/or use of iterative reconstruction technique. CONTRAST:  46m OMNIPAQUE IOHEXOL 300 MG/ML  SOLN COMPARISON:  Chest CT 09/06/2021. FINDINGS: Cardiovascular: There are no filling defects within the pulmonary arterial tree to suggest the presence of pulmonary embolism. Heart size is normal. Trace amount of pericardial fluid and/or thickening, adjacent to the left ventricle, without associated pericardial calcification, unlikely of hemodynamic significance at this time. Mediastinum/Nodes: No pathologically enlarged mediastinal or hilar lymph nodes. Hilar esophagus is  unremarkable in appearance. No axillary lymphadenopathy. Lungs/Pleura: Extensive areas of passive subsegmental atelectasis are noted in the lower lobes of the lungs bilaterally. No acute consolidative airspace disease. No pleural effusions. No pneumothorax. No definite suspicious appearing pulmonary nodules or masses are noted. Upper Abdomen: Aortic atherosclerosis. 2.6 x 2.0 cm left adrenal nodule, unchanged, previously characterized as an adenoma. Musculoskeletal: There are no aggressive appearing lytic or blastic lesions noted in the visualized portions of the skeleton. Review of the MIP images confirms the above findings. IMPRESSION: 1. No evidence of pulmonary embolism. Previously noted nonocclusive thrombus in the right lower lobe has resolved since the recent prior examination. No new emboli are noted. 2. Trace volume of pericardial fluid, unlikely of any hemodynamic significance at this time. 3. Aortic atherosclerosis. 4. Left adrenal adenoma, unchanged. Aortic Atherosclerosis (ICD10-I70.0). Electronically Signed   By: DVinnie LangtonM.D.   On: 09/09/2021 05:26   MR LUMBAR SPINE WO CONTRAST  Result Date: 09/07/2021 CLINICAL DATA:  Initial evaluation for low back pain, cauda equina syndrome suspected. EXAM: MRI LUMBAR SPINE WITHOUT CONTRAST TECHNIQUE: Multiplanar, multisequence MR imaging of the lumbar spine was performed. No intravenous contrast was administered. COMPARISON:  Prior CT from earlier the same day. FINDINGS: Segmentation: Standard. Lowest well-formed disc space labeled the L5-S1 level. Alignment: Physiologic with preservation of the normal lumbar lordosis. No listhesis. Vertebrae: Vertebral body height maintained without acute or chronic fracture. Bone marrow signal intensity heterogeneous but overall within normal limits. Few scattered benign hemangiomata noted. No worrisome osseous lesions. Prominent discogenic reactive endplate change  with associated marrow edema present about the  T11-12 and L5-S1 interspaces. No other abnormal marrow edema. Conus medullaris and cauda equina: Conus extends to the T12-L1 level. Conus and cauda equina appear normal. Paraspinal and other soft tissues: Paraspinous soft tissues within normal limits. 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 Visualized visceral structures otherwise grossly unremarkable. Disc levels: T11-12: Degenerative intervertebral disc space narrowing with disc desiccation and diffuse disc bulge. Associated reactive endplate spurring with marrow edema. Mild facet hypertrophy. No spinal stenosis. Foramina remain patent. T12-L1: Unremarkable. L1-2: Minimal biforaminal disc bulging. No spinal stenosis. Foramina remain patent. L2-3: Negative interspace. Mild facet hypertrophy. No spinal stenosis. Foramina remain patent. L3-4: Disc desiccation with mild disc bulge. Superimposed small left foraminal disc protrusion contacts the exiting left L3 nerve root (series 5, image 31). Mild left greater than right facet hypertrophy. No significant spinal stenosis. Mild bilateral L3 foraminal narrowing. L4-5: Disc desiccation with minimal disc bulge. Moderate facet hypertrophy. No spinal stenosis. Mild bilateral L4 foraminal stenosis, worse on the left. L5-S1: Degenerative intervertebral disc space narrowing with disc desiccation and diffuse disc bulge. Associated reactive endplate change with marginal endplate osteophytic spurring and marrow edema. Mild to moderate facet hypertrophy. No significant spinal stenosis. Mild bilateral L5 foraminal narrowing. IMPRESSION: 1. No acute abnormality within the lumbar spine. No cord compression or significant spinal stenosis. 2. Small left foraminal disc protrusion at L3-4, contacting and potentially irritating the exiting left L3 nerve root. 3. Disc bulging with facet hypertrophy at L4-5 and L5-S1 with resultant mild bilateral L4 and L5 foraminal stenosis. 4. Prominent discogenic reactive  endplate change with associated marrow edema about the T11-12 and L5-S1 interspaces. Finding could serve as a source for lower back pain. 5. 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 Electronically Signed   By: Jeannine Boga M.D.   On: 09/07/2021 03:03   CT L-SPINE NO CHARGE  Result Date: 09/06/2021 CLINICAL DATA:  Back pain EXAM: CT THORACIC AND LUMBAR SPINE WITHOUT CONTRAST TECHNIQUE: Multidetector CT imaging of the thoracic and lumbar spine was performed without contrast. Multiplanar CT image reconstructions were also generated. RADIATION DOSE REDUCTION: This exam was performed according to the departmental dose-optimization program which includes automated exposure control, adjustment of the mA and/or kV according to patient size and/or use of iterative reconstruction technique. COMPARISON:  CTA chest/abdomen/pelvis 08/29/2021, lumbar spine MRI 07/12/2021 FINDINGS: CT THORACIC SPINE FINDINGS Alignment: Normal. Vertebrae: Vertebral body heights are preserved. There is no evidence of acute injury. There is no suspicious osseous lesion. Paraspinal and other soft tissues: The paraspinal soft tissues are unremarkable. The thorax is assessed in full on the separately dictated CTA chest. Disc levels: There is overall mild multilevel disc space narrowing degenerative endplate change throughout the thoracic spine. There is no high-grade osseous spinal canal or neural foraminal stenosis. CT LUMBAR SPINE FINDINGS Segmentation: Standard; the lowest formed disc space is designated L5-S1. Alignment: Normal. Vertebrae: Vertebral body heights are preserved. There is no evidence of acute injury. There is no suspicious osseous lesion. Paraspinal and other soft tissues: The paraspinal soft tissues are unremarkable. The abdominal and pelvic viscera are assessed on the separately dictated CT abdomen/pelvis. Disc levels: There is disc space narrowing with vacuum disc phenomenon and associated  degenerative endplate change at Y1-E5. The other disc heights are preserved. There is mild facet arthropathy at L4-L5 and L5-S1. Mild bilateral neural foraminal stenosis at L5-S1 is unchanged. There is no other significant neural  foraminal stenosis. There is no evidence of significant spinal canal stenosis. IMPRESSION: 1. Overall mild multilevel degenerative changes throughout the thoracolumbar spine as above with disc space narrowing and degenerative endplate change most advanced at L5-S1. 2. No evidence of high-grade spinal canal or neural foraminal stenosis. 3. No acute findings. Electronically Signed   By: Valetta Mole M.D.   On: 09/06/2021 11:13   CT T-SPINE NO CHARGE  Result Date: 09/06/2021 CLINICAL DATA:  Back pain EXAM: CT THORACIC AND LUMBAR SPINE WITHOUT CONTRAST TECHNIQUE: Multidetector CT imaging of the thoracic and lumbar spine was performed without contrast. Multiplanar CT image reconstructions were also generated. RADIATION DOSE REDUCTION: This exam was performed according to the departmental dose-optimization program which includes automated exposure control, adjustment of the mA and/or kV according to patient size and/or use of iterative reconstruction technique. COMPARISON:  CTA chest/abdomen/pelvis 08/29/2021, lumbar spine MRI 07/12/2021 FINDINGS: CT THORACIC SPINE FINDINGS Alignment: Normal. Vertebrae: Vertebral body heights are preserved. There is no evidence of acute injury. There is no suspicious osseous lesion. Paraspinal and other soft tissues: The paraspinal soft tissues are unremarkable. The thorax is assessed in full on the separately dictated CTA chest. Disc levels: There is overall mild multilevel disc space narrowing degenerative endplate change throughout the thoracic spine. There is no high-grade osseous spinal canal or neural foraminal stenosis. CT LUMBAR SPINE FINDINGS Segmentation: Standard; the lowest formed disc space is designated L5-S1. Alignment: Normal. Vertebrae:  Vertebral body heights are preserved. There is no evidence of acute injury. There is no suspicious osseous lesion. Paraspinal and other soft tissues: The paraspinal soft tissues are unremarkable. The abdominal and pelvic viscera are assessed on the separately dictated CT abdomen/pelvis. Disc levels: There is disc space narrowing with vacuum disc phenomenon and associated degenerative endplate change at K9-X8. The other disc heights are preserved. There is mild facet arthropathy at L4-L5 and L5-S1. Mild bilateral neural foraminal stenosis at L5-S1 is unchanged. There is no other significant neural foraminal stenosis. There is no evidence of significant spinal canal stenosis. IMPRESSION: 1. Overall mild multilevel degenerative changes throughout the thoracolumbar spine as above with disc space narrowing and degenerative endplate change most advanced at L5-S1. 2. No evidence of high-grade spinal canal or neural foraminal stenosis. 3. No acute findings. Electronically Signed   By: Valetta Mole M.D.   On: 09/06/2021 11:13   CT Angio Chest PE W and/or Wo Contrast  Result Date: 09/06/2021 CLINICAL DATA:  Pulmonary embolism suspected, abdominal pain EXAM: CT ANGIOGRAPHY CHEST CT ABDOMEN AND PELVIS WITH CONTRAST TECHNIQUE: Multidetector CT imaging of the chest was performed using the standard protocol during bolus administration of intravenous contrast. Multiplanar CT image reconstructions and MIPs were obtained to evaluate the vascular anatomy. Multidetector CT imaging of the abdomen and pelvis was performed using the standard protocol during bolus administration of intravenous contrast. RADIATION DOSE REDUCTION: This exam was performed according to the departmental dose-optimization program which includes automated exposure control, adjustment of the mA and/or kV according to patient size and/or use of iterative reconstruction technique. CONTRAST:  132m OMNIPAQUE IOHEXOL 350 MG/ML SOLN COMPARISON:  Same-day chest  radiograph, CTA chest 08/29/2021, CT abdomen/pelvis 05/23/2021 and 08/29/2021 FINDINGS: CTA CHEST FINDINGS Cardiovascular: There is opacification of the pulmonary arteries to the segmental level. There is nonocclusive filling defect in a right lower lobe consistent with pulmonary embolism (4-160). No other definite pulmonary embolism is seen. There is no evidence of right heart strain. The main pulmonary artery is enlarged measuring up to 3.2 cm. The  heart is enlarged, unchanged. There is no pericardial effusion. Aortic valve calcifications and calcified atherosclerotic plaque in the thoracic aorta are unchanged. Mediastinum/Nodes: Thyroid is prominent without focal lesion. The esophagus is grossly unremarkable. There is no mediastinal, hilar, or axillary lymphadenopathy. Lungs/Pleura: Trachea and central airways are patent. Consolidation in the bilateral lower lobes likely reflects atelectasis. Linear opacities in the right middle lobe likely also reflect platelike atelectasis. There is no other focal consolidation. There is no pulmonary edema. There is no pleural effusion or pneumothorax. Musculoskeletal: There is no acute osseous abnormality or suspicious osseous lesion. Review of the MIP images confirms the above findings. CT ABDOMEN and PELVIS FINDINGS Hepatobiliary: The liver is unremarkable. The gallbladder is surgically absent. There is no biliary ductal dilatation. Pancreas: Unremarkable. Spleen: Unremarkable. Adrenals/Urinary Tract: There is a 2.0 cm left adrenal nodule previously characterized as a benign adenoma. The right adrenal is unremarkable. Kidneys are unremarkable, with no focal lesion, stone, hydronephrosis, or hydroureter. There is symmetric excretion of contrast into the collecting systems on the delayed images. The bladder is decompressed with circumferential wall thickening. Stomach/Bowel: The stomach is unremarkable. There is no evidence of bowel obstruction. Postsurgical changes reflecting  partial right colectomy are again noted without evidence of complication. There is no abnormal bowel wall thickening or inflammatory change. Vascular/Lymphatic: There is extensive calcified atherosclerotic plaque throughout the nonaneurysmal abdominal aorta. The major branch vessels are patent. The main portal and splenic veins are patent. There is no abdominal or pelvic lymphadenopathy. Reproductive: Uterus is surgically absent. There is no adnexal mass. Other: There is no ascites or free air. Small fat containing inguinal hernias are again seen. Musculoskeletal: There is no acute osseous abnormality or suspicious osseous lesion. Review of the MIP images confirms the above findings. IMPRESSION: 1. Nonocclusive filling defect in a right lower lobe segmental pulmonary artery consistent with pulmonary embolism. No evidence of right heart strain. This was called by telephone at the time of interpretation on 09/06/2021 at 11:05 am to provider [facility] , who verbally acknowledged these results. 2. Bibasilar dependent atelectasis. 3. Circumferential bladder wall thickening may be due to underdistention, but cystitis can have a similar appearance. Correlate with urinalysis. 4. No other acute findings in the abdomen or pelvis. 5. Cardiomegaly and Aortic Atherosclerosis (ICD10-I70.0). Electronically Signed   By: Valetta Mole M.D.   On: 09/06/2021 11:06   CT ABDOMEN PELVIS W CONTRAST  Result Date: 09/06/2021 CLINICAL DATA:  Pulmonary embolism suspected, abdominal pain EXAM: CT ANGIOGRAPHY CHEST CT ABDOMEN AND PELVIS WITH CONTRAST TECHNIQUE: Multidetector CT imaging of the chest was performed using the standard protocol during bolus administration of intravenous contrast. Multiplanar CT image reconstructions and MIPs were obtained to evaluate the vascular anatomy. Multidetector CT imaging of the abdomen and pelvis was performed using the standard protocol during bolus administration of intravenous contrast.  RADIATION DOSE REDUCTION: This exam was performed according to the departmental dose-optimization program which includes automated exposure control, adjustment of the mA and/or kV according to patient size and/or use of iterative reconstruction technique. CONTRAST:  17m OMNIPAQUE IOHEXOL 350 MG/ML SOLN COMPARISON:  Same-day chest radiograph, CTA chest 08/29/2021, CT abdomen/pelvis 05/23/2021 and 08/29/2021 FINDINGS: CTA CHEST FINDINGS Cardiovascular: There is opacification of the pulmonary arteries to the segmental level. There is nonocclusive filling defect in a right lower lobe consistent with pulmonary embolism (4-160). No other definite pulmonary embolism is seen. There is no evidence of right heart strain. The main pulmonary artery is enlarged measuring up to 3.2 cm. The heart  is enlarged, unchanged. There is no pericardial effusion. Aortic valve calcifications and calcified atherosclerotic plaque in the thoracic aorta are unchanged. Mediastinum/Nodes: Thyroid is prominent without focal lesion. The esophagus is grossly unremarkable. There is no mediastinal, hilar, or axillary lymphadenopathy. Lungs/Pleura: Trachea and central airways are patent. Consolidation in the bilateral lower lobes likely reflects atelectasis. Linear opacities in the right middle lobe likely also reflect platelike atelectasis. There is no other focal consolidation. There is no pulmonary edema. There is no pleural effusion or pneumothorax. Musculoskeletal: There is no acute osseous abnormality or suspicious osseous lesion. Review of the MIP images confirms the above findings. CT ABDOMEN and PELVIS FINDINGS Hepatobiliary: The liver is unremarkable. The gallbladder is surgically absent. There is no biliary ductal dilatation. Pancreas: Unremarkable. Spleen: Unremarkable. Adrenals/Urinary Tract: There is a 2.0 cm left adrenal nodule previously characterized as a benign adenoma. The right adrenal is unremarkable. Kidneys are unremarkable,  with no focal lesion, stone, hydronephrosis, or hydroureter. There is symmetric excretion of contrast into the collecting systems on the delayed images. The bladder is decompressed with circumferential wall thickening. Stomach/Bowel: The stomach is unremarkable. There is no evidence of bowel obstruction. Postsurgical changes reflecting partial right colectomy are again noted without evidence of complication. There is no abnormal bowel wall thickening or inflammatory change. Vascular/Lymphatic: There is extensive calcified atherosclerotic plaque throughout the nonaneurysmal abdominal aorta. The major branch vessels are patent. The main portal and splenic veins are patent. There is no abdominal or pelvic lymphadenopathy. Reproductive: Uterus is surgically absent. There is no adnexal mass. Other: There is no ascites or free air. Small fat containing inguinal hernias are again seen. Musculoskeletal: There is no acute osseous abnormality or suspicious osseous lesion. Review of the MIP images confirms the above findings. IMPRESSION: 1. Nonocclusive filling defect in a right lower lobe segmental pulmonary artery consistent with pulmonary embolism. No evidence of right heart strain. This was called by telephone at the time of interpretation on 09/06/2021 at 11:05 am to provider [facility] , who verbally acknowledged these results. 2. Bibasilar dependent atelectasis. 3. Circumferential bladder wall thickening may be due to underdistention, but cystitis can have a similar appearance. Correlate with urinalysis. 4. No other acute findings in the abdomen or pelvis. 5. Cardiomegaly and Aortic Atherosclerosis (ICD10-I70.0). Electronically Signed   By: Valetta Mole M.D.   On: 09/06/2021 11:06   DG Chest Portable 1 View  Result Date: 09/06/2021 CLINICAL DATA:  Chest pain, shortness of breath. EXAM: PORTABLE CHEST 1 VIEW COMPARISON:  Aug 31, 2021. FINDINGS: Stable cardiomegaly. Minimal left basilar subsegmental atelectasis  or scarring is noted. Right lung is clear. Bony thorax is unremarkable. IMPRESSION: Minimal left basilar subsegmental atelectasis or scarring is noted. Electronically Signed   By: Marijo Conception M.D.   On: 09/06/2021 08:42

## 2021-10-09 ENCOUNTER — Telehealth: Payer: Self-pay | Admitting: Dietician

## 2021-10-09 NOTE — Telephone Encounter (Signed)
Patient screened on MST. First attempt to reach.   Called patient's home phone number and reached Butch Penny her aide.  Butch Penny reports mobile# is patient's home# but since patient has some trouble with retention of information her # was put as primary.  She will be with patient tomorrow and I arranged for telephone appointment after her 11 am appointment in [place].  Set up a telephone nutrition consult.  April Manson, RDN, LDN Registered Dietitian, [place] Part Time Remote (Usual office hours: Tuesday-Thursday) Cell: [phone]

## 2021-10-10 ENCOUNTER — Inpatient Hospital Stay: Payer: Medicare HMO | Admitting: Dietician

## 2021-10-10 NOTE — Progress Notes (Signed)
Nutrition Assessment   Reason for Assessment: MST screen for weight loss.    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 She has had recent hospitalizations and ER visits recently for abdominal pain and constipation. Her weight loss has coincided with this increased pain.  She also has complaints of nausea which impedes her PO intake. She is not taking any pain medicine currently except tylenol, and she has stopped taking oral iron.  Last BM this morning and she described it as liquid but not much volume.  She reports she is still using Miralax. She also reports her pain level 9/10.  She claims her appetite is good she's hungry. However, she had nothing this morning or yet today because she is a little nauseous.  Yesterday she tried to eat pinto beans corn bread, chicken, [employer] wings She like fruit, she doesn't like hot cereals, she doesn't drink hot liquids.   Drinking 2-3 regular Ensure  and about  3 small bottles of water  (doesn't like prune juice, apple juice, yogurts, jello)   Anthropometrics:  lost 5# (3.2%) past month, not yet significant  Height: 64" Weight:  10/03/21  150# 10/02/21  151# 09/10/21  154# 09/06/21  155# 07/31/21  157#   BMI: 25.76  INTERVENTION: Reviewed recommendation for controlling nausea and constipation.  Encouraged used of small frequent feeds, bland foods, increasing liquids. She is willing to try Gingerale.  Will mail tips for controlling nausea and constipation.   MONITORING, EVALUATION, GOAL: Nutrition impact symptoms, PO intake, goal of weight maintenance   Next Visit: PRN after weight check and MD appointment 10/31/21  April Manson, RDN, LDN Registered Dietitian, [place] Part Time Remote (Usual office hours: Tuesday-Thursday) Cell: [phone]

## 2021-10-30 ENCOUNTER — Other Ambulatory Visit: Payer: Self-pay

## 2021-10-31 ENCOUNTER — Inpatient Hospital Stay: Payer: Medicare HMO

## 2021-10-31 ENCOUNTER — Other Ambulatory Visit: Payer: Self-pay | Admitting: Hematology and Oncology

## 2021-10-31 ENCOUNTER — Other Ambulatory Visit: Payer: Self-pay

## 2021-10-31 ENCOUNTER — Telehealth: Payer: Self-pay | Admitting: Hematology and Oncology

## 2021-10-31 ENCOUNTER — Inpatient Hospital Stay: Payer: Medicare HMO | Attending: Oncology | Admitting: Hematology and Oncology

## 2021-10-31 ENCOUNTER — Encounter: Payer: Self-pay | Admitting: Hematology and Oncology

## 2021-10-31 VITALS — BP 132/77 | HR 65 | Temp 99.0°F | Resp 20 | Ht 64.0 in | Wt 156.8 lb

## 2021-10-31 VITALS — BP 135/74 | HR 68 | Temp 98.7°F | Resp 20 | Ht 64.0 in | Wt 156.2 lb

## 2021-10-31 DIAGNOSIS — C7A012 Malignant carcinoid tumor of the ileum: Secondary | ICD-10-CM | POA: Insufficient documentation

## 2021-10-31 DIAGNOSIS — Z79899 Other long term (current) drug therapy: Secondary | ICD-10-CM | POA: Diagnosis not present

## 2021-10-31 LAB — BASIC METABOLIC PANEL
BUN: 7 (ref 4–21)
CO2: 30 — AB (ref 13–22)
Chloride: 104 (ref 99–108)
Creatinine: 0.6 (ref 0.5–1.1)
Glucose: 139
Potassium: 3.3 mEq/L — AB (ref 3.5–5.1)
Sodium: 140 (ref 137–147)

## 2021-10-31 LAB — CBC AND DIFFERENTIAL
HCT: 36 (ref 36–46)
Hemoglobin: 12 (ref 12.0–16.0)
Neutrophils Absolute: 2.04
Platelets: 234 10*3/uL (ref 150–400)
WBC: 6

## 2021-10-31 LAB — COMPREHENSIVE METABOLIC PANEL
Albumin: 3.8 (ref 3.5–5.0)
Calcium: 9.6 (ref 8.7–10.7)

## 2021-10-31 LAB — HEPATIC FUNCTION PANEL
ALT: 76 U/L — AB (ref 7–35)
AST: 63 — AB (ref 13–35)
Alkaline Phosphatase: 113 (ref 25–125)
Bilirubin, Total: 0.4

## 2021-10-31 LAB — CBC
MCV: 94 (ref 81–99)
RBC: 3.86 — AB (ref 3.87–5.11)

## 2021-10-31 MED ORDER — OCTREOTIDE ACETATE 20 MG IM KIT
20.0000 mg | PACK | Freq: Once | INTRAMUSCULAR | Status: AC
  Administered 2021-10-31: 20 mg via INTRAMUSCULAR

## 2021-10-31 MED ORDER — HEPARIN SOD (PORK) LOCK FLUSH 100 UNIT/ML IV SOLN
500.0000 [IU] | Freq: Once | INTRAVENOUS | Status: AC | PRN
  Administered 2021-10-31: 250 [IU]

## 2021-10-31 MED ORDER — SODIUM CHLORIDE 0.9% FLUSH
10.0000 mL | INTRAVENOUS | Status: DC | PRN
  Administered 2021-10-31: 10 mL

## 2021-10-31 NOTE — Progress Notes (Signed)
. 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  Seven of eighteen lymph nodes were involved (7/18).  Primary tumor measured 2.2 x 1.7 x 1.5 cm, with three apparent vascular metastases including a nodule up to 3 cm with metastatic tumor present within 1 mm of the inked radial margin.  In the distal appendix there was also a separate 3 mm carcinoid tumor.  This was a staged as a pT3pN1 with low mitotic rate (0 per 10 high powered fields).    [name] was sporadic.  We began seeing her in February 2022 for carcinoid syndrome symptoms.  Her chromogranin A was elevated at 262.  CT chest, abdomen and pelvis did not reveal any evidence of metastatic disease.  She was started on octreotide every 4 weeks.  MRI lumbar spine in March revealed progressive disc space narrowing at L5-S1with unchanged mild bilateral neural foraminal stenosis; unchanged mild lateral recess and neural foraminal stenosis at L3-4 and L4-5.  Chromogranin A remains normal in April.  She has had multiple hospitalizations in the past couple of months.  She was admitted with sepsis in May.  She has had difficulty with constipation, and in June, she underwent enemas and disimpaction in the ER.  She had been on oral iron supplement, but this was discontinued.  She was admitted in June for osteomyelitis and continues on IV antibiotics at home.  She has a PICC in place.  Her hemoglobin is normal today, so she will remain off oral iron.  She will proceed with octreotide this week and return to clinic in 4 weeks with a CBC and comprehensive metabolic panel prior to her next octreotide.   Her potassium is  borderline low, so I will have her increase potassium in her diet.  Her transaminases are mildly elevated and had been previously.  We will continue to monitor theses.  We will plan to see her back in 4 weeks as above.  The patient understands the plans discussed today and is in agreement with them.  She knows to contact our office if she develops concerns prior to her next appointment.   I provided 20 minutes of face-to-face time during this encounter and > 50% was spent counseling as documented under my assessment and plan.    [name], PA-C  [facility] AT [facility] [address] [place] [address] Dept: [phone] Dept Fax: [phone]   Orders Placed This Encounter  Procedures   CBC and differential    This external order was created through the Results Console.   CBC    This external order was created through the Results Console.   CBC    This order was created through External Result Entry   Chromogranin A    Standing Status:   Future    Number of Occurrences:   1    Standing Expiration Date:   07/01/6292   Basic metabolic panel    This external order was created through the Results Console.   Comprehensive metabolic panel    This external order was created through the Results Console.   Hepatic function panel    This external order was  created through the Results Console.   SCHEDULING COMMUNICATION INJECTION    Schedule 30 minute port flush appointment      CHIEF COMPLAINT:  CC: Malignant carcinoid  Current Treatment:  Octreotide every 4 weeks  HISTORY OF PRESENT ILLNESS:  Kristi Romero is a 66 year old. female referred by Dr. Melina Copa for a transfer of care and further management of her history of neuroendocrine and carcinoid tumor, which was diagnosed in October 2016.  CT abdomen at that time revealed a potentially hypervascular masslike lesion anterior to the right psoas muscle in the right lower quadrant  measuring 2.6 x 2.0 cm as well as a hypervascular mass in the ascending colon adjacent to the ileocecal valve, concerning for mesenteric metastatic disease.  She underwent surgical resection with Dr. Zada Girt. Pathology confirmed well differentiated neuroendocrine tumor consistent with carcinoid tumor.  Seven of eighteen lymph nodes were involved (7/18).  Primary tumor measured 2.2 x 1.7 x 1.5 cm, with three apparent vascular metastases including a nodule up to 3 cm with metastatic tumor present within 1 mm of the inked radial margin.  In the distal appendix there was also a separate 3 mm carcinoid tumor.  This was a staged as a pT3pN1 with low mitotic rate (0 per 10 high powered fields).  She did require a PEG tube in 2017 for failure to thrive and this was removed in January 2018.  Her weight was up to 157 by September 2021.   Her last follow up was in January 2020 with Dr. Verl Blalock until we started seeing her in early February 2022.  Eddy complained of weight loss, stomach pain and nausea.  She was evaluated by Dr. Melina Copa with endoscopy in November 2021 which revealed chronic gastritis and was negative for H.Pylori.  No evidence of malignancy was observed.  She also notes bilateral numbness "from her mouth to her feet".   She receives B12 injections every 6 months.  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  Chromogranin A from February was elevated at 262.3. She also had an elevated serum protein. Serum protein electrophoresis did not reveal a monoclonal spike. CT imaging revealed adrenal adenomas but no obvious evidence of recurrent/progressive disease.  She was started on octreotide injection in March 2022 with good response based on her decreasing chromogranin A.  CT chest, abdomen and pelvis in August revealed postoperative changes of right hemicolectomy with enterocolonic anastomosis without evidence of local recurrence or abdominopelvic metastatic  disease.  Stable left adrenal gland adenoma.  MRI brain in August revealed evidence of chronic ischemia. She had normalization of the Chromogranin A in April and it has remained normal.    MRI lumbar spine in March revealed progressive disc space narrowing at L5-S1 with unchanged mild bilateral neural foraminal stenosis; unchanged mild lateral recess and neural foraminal stenosis at L3-4 and L4-5.  Chromogranin A remains normal in April.  She has had multiple ER visits and hospitalizations in the past couple of months.  She was admitted with sepsis in May.  She has had difficulty with constipation, and in June, she underwent enemas and disimpaction in the ER.  She had been on oral iron supplement, but this was discontinued.  She continues octreotide every 4 weeks.    Oncology History  Malignant carcinoid tumor of ileum ([place])  04/28/2014 Initial Diagnosis   Malignant carcinoid tumor of ileum ([place])   02/02/2015 Cancer Staging   Staging form: Neuroendocrine Tumor - Duodenum/Ampulla/Jejunum/Illeum, AJCC 7th Edition -  Clinical stage from 02/02/2015: Stage IIIB (T3(3), N1, M0) - Signed by Derwood Kaplan, MD on 05/28/2020 Staged by: Managing physician Diagnostic confirmation: Positive histology Specimen type: Excision Histopathologic type: Carcinoid tumor, NOS Stage prefix: Initial diagnosis Tumor size (mm): 22 Multiple tumors: Yes Number of tumors: 3 Histologic grade (G): G1 Lymph-vascular invasion (LVI): LVI not present (absent)/not identified Residual tumor (R): R0 - None Stage used in treatment planning: Yes National guidelines used in treatment planning: Yes Type of national guideline used in treatment planning: NCCN Staging comments: Surgical resection       INTERVAL HISTORY:  Kristi Romero is here today for repeat clinical assessment.  She was admitted in June for osteomyelitis.  She presented to the ER for worsening back pain and CT abdomen and pelvis does not reveal any evidence of  malignancy, there were changes of the T10 and T11 vertebral bodies suspicious for infectious discitis.  MRI thoracic spine revealed bone marrow edema in T1, T2 and T3, as well as T10 and T11 concerning for osteomyelitis/discitis.  Biopsy of the T11 vertebral body did not reveal evidence of acute osteomyelitis or malignancy.  Sclerotic bone fragments, blood clot and scant hypocellular marrow with fibrosis was noted.  She will be on IV ceftriaxone at home for 6 weeks.  She has a PICC in place. she reports continued back pain, for which she is now on oxycodone 5 mg every 6 hours as needed.  She reports intermittent constipation since stopping the iron and uses MiraLAX once or twice a day as needed.  She continues to report chronic numbness from her mouth to her toes.  She was on gabapentin, but stopped it for a while, as she did not feel it was helping. She has now resumed that.   She denies fevers or chills. She denies pain. Her appetite is good. Her weight has increased 6 pounds over last 4 weeks .  She is scheduled to see her PCP, Duard Larsen, NP next week  REVIEW OF SYSTEMS:  Review of Systems  Constitutional:  Positive for fatigue. Negative for appetite change, chills, fever and unexpected weight change.  HENT:   Negative for lump/mass, mouth sores and sore throat.   Respiratory:  Negative for cough and shortness of breath.   Cardiovascular:  Negative for chest pain and leg swelling.  Gastrointestinal:  Negative for abdominal pain, constipation, diarrhea, nausea and vomiting.  Endocrine: Negative for hot flashes.  Genitourinary:  Negative for difficulty urinating, dysuria, frequency and hematuria.   Musculoskeletal:  Positive for back pain. Negative for arthralgias and myalgias.  Skin:  Negative for rash.  Neurological:  Positive for numbness. Negative for dizziness and headaches.  Hematological:  Negative for adenopathy. Does not bruise/bleed easily.  Psychiatric/Behavioral:  Negative for  depression and sleep disturbance. The patient is not nervous/anxious.      VITALS:  Blood pressure 135/74, pulse 68, temperature 98.7 F (37.1 C), resp. rate 20, height '5\' 4"'$  (1.626 m), weight 156 lb 3.2 oz (70.9 kg), SpO2 94 %.  Wt Readings from Last 3 Encounters:  10/31/21 156 lb 12 oz (71.1 kg)  10/31/21 156 lb 3.2 oz (70.9 kg)  10/03/21 150 lb 1.6 oz (68.1 kg)    Body mass index is 26.81 kg/m.  Performance status (ECOG): 1 - Symptomatic but completely ambulatory  PHYSICAL EXAM:  Physical Exam Vitals and nursing note reviewed.  Constitutional:      General: She is not in acute distress.    Appearance: Normal appearance.  HENT:  Head: Normocephalic and atraumatic.     Mouth/Throat:     Mouth: Mucous membranes are moist.     Pharynx: Oropharynx is clear. No oropharyngeal exudate or posterior oropharyngeal erythema.  Eyes:     General: No scleral icterus.    Extraocular Movements: Extraocular movements intact.     Conjunctiva/sclera: Conjunctivae normal.     Pupils: Pupils are equal, round, and reactive to light.  Cardiovascular:     Rate and Rhythm: Normal rate and regular rhythm.     Heart sounds: Normal heart sounds. No murmur heard.    No friction rub. No gallop.  Pulmonary:     Effort: Pulmonary effort is normal.     Breath sounds: Normal breath sounds. No wheezing, rhonchi or rales.  Abdominal:     General: There is no distension.     Palpations: Abdomen is soft. There is no hepatomegaly, splenomegaly or mass.     Tenderness: There is no abdominal tenderness.  Musculoskeletal:        General: Normal range of motion.     Cervical back: Normal range of motion and neck supple. No tenderness.     Right lower leg: No edema.     Left lower leg: No edema.  Lymphadenopathy:     Cervical: No cervical adenopathy.     Upper Body:     Right upper body: No supraclavicular or axillary adenopathy.     Left upper body: No supraclavicular or axillary adenopathy.     Lower  Body: No right inguinal adenopathy. No left inguinal adenopathy.  Skin:    General: Skin is warm and dry.     Coloration: Skin is not jaundiced.     Findings: No rash.  Neurological:     Mental Status: She is alert and oriented to person, place, and time.     Cranial Nerves: No cranial nerve deficit.  Psychiatric:        Mood and Affect: Mood normal.        Behavior: Behavior normal.        Thought Content: Thought content normal.     LABS:      Latest Ref Rng & Units 10/31/2021   12:00 AM 10/03/2021   12:00 AM 10/02/2021    4:08 AM  CBC  WBC  6.0     10.5     9.6   Hemoglobin 12.0 - 16.0 12.0     11.6     11.8   Hematocrit 36 - 46 36     36     37.2   Platelets 150 - 400 K/uL 234     434     397      This result is from an external source.      Latest Ref Rng & Units 10/31/2021   12:00 AM 10/03/2021   12:00 AM 10/02/2021    4:08 AM  CMP  Glucose 70 - 99 mg/dL   114   BUN 4 - '21 7     10     6   '$ Creatinine 0.5 - 1.1 0.6     0.7     0.76   Sodium 137 - 147 140     139     135   Potassium 3.5 - 5.1 mEq/L 3.3     3.9     3.8   Chloride 99 - 108 104     100     98   CO2 13 - 22 30  31     28   Calcium 8.7 - 10.7 9.6     9.4     9.5   Total Protein 6.5 - 8.1 g/dL   8.4   Total Bilirubin 0.3 - 1.2 mg/dL   0.5   Alkaline Phos 25 - 125 113     115     98   AST 13 - 35 63     29     18   ALT 7 - 35 U/L 76     17     13      This result is from an external source.     No results found for: "CEA1", "CEA" / No results found for: "CEA1", "CEA" No results found for: "PSA1" No results found for: "CAN199" No results found for: "CAN125"  Lab Results  Component Value Date   TOTALPROTELP 6.6 06/01/2020   ALBUMINELP 3.1 06/01/2020   A1GS 0.3 06/01/2020   A2GS 0.8 06/01/2020   BETS 1.1 06/01/2020   GAMS 1.4 06/01/2020   MSPIKE Not Observed 06/01/2020   SPEI Comment 06/01/2020   Lab Results  Component Value Date   TIBC 244 (L) 09/06/2021   FERRITIN 873 (H) 09/06/2021    IRONPCTSAT 9 (L) 09/06/2021   No results found for: "LDH"  STUDIES:   Exam(s): [id number] CT/CT ABD-PELV W/IV CM  CLINICAL DATA: Abdominal pain radiating to back. Constipation.  EXAM:  CT ABDOMEN AND PELVIS WITH CONTRAST  TECHNIQUE:  Multidetector CT imaging of the abdomen and pelvis was performed  using the standard protocol following bolus administration of  intravenous contrast.   RADIATION DOSE REDUCTION: This exam was performed according to the  departmental dose-optimization program which includes automated  exposure control, adjustment of the mA and/or kV according to  patient size and/or use of iterative reconstruction technique.   CONTRAST: 100 mL Isovue 370   COMPARISON: 10/02/2021  FINDINGS:  Lower Chest: No acute findings.  Hepatobiliary: No hepatic masses identified. Prior cholecystectomy.  No evidence of biliary obstruction.  Pancreas: No mass or inflammatory changes.  Spleen: Within normal limits in size and appearance.  Adrenals/Urinary Tract: 2.2 cm left adrenal mass remains stable  compared to previous studies dating back to 2017, consistent with a  benign adrenal adenoma. No renal masses identified. No evidence of  ureteral calculi or hydronephrosis.  Stomach/Bowel: Stable postop changes from partial right colectomy.  No evidence of obstruction, inflammatory process or abnormal fluid  collections. Large amount of stool again noted in right colon,  however not significantly changed since prior study.  Vascular/Lymphatic: No pathologically enlarged lymph nodes. No acute  vascular findings. Aortic atherosclerotic calcification incidentally  noted.  Reproductive: Prior hysterectomy noted. Adnexal regions are  unremarkable in appearance.  Other: None.  Musculoskeletal: Mildly increased size of lytic lesions and reactive  sclerosis is seen involving the endplates of the Z32 and T11  vertebral bodies, which is centered on the T10-11 disc space (image  [id number]).  Mildly increased paraspinal soft tissue density is also  seen at this site. These findings are highly suspicious for  infectious discitis.   IMPRESSION:  Mildly increased size of lytic lesions and reactive sclerosis  involving the T10-11 vertebral bodies, suspicious for infectious  discitis. Recommend thoracic spine MRI without and with contrast for  further evaluation.  No other acute findings within the abdomen or pelvis.  Stable benign left adrenal adenoma.  Aortic Atherosclerosis (ICD10-I70.0).     Exam(s): [id number] MRI/MRI CER-THOR-LUM SPINE WO/W CM  CLINICAL DATA: Mid back pain, cervical pain for several years.  EXAM:  MRI CERVICAL, THORACIC AND LUMBAR SPINE WITHOUT AND WITH CONTRAST  TECHNIQUE:  Multiplanar and multiecho pulse sequences of the cervical spine, to  include the craniocervical junction and cervicothoracic junction,  and thoracic and lumbar spine, were obtained without and with  intravenous contrast.  CONTRAST: 7 mL Gadavist  COMPARISON: None Available.  FINDINGS:  MRI CERVICAL SPINE FINDINGS  Alignment: Physiologic.  Vertebrae: No acute fracture, evidence of discitis, or aggressive  bone lesion.  Cord: Small focal area of T2 hyperintense signal in the cervical  spinal cord at the level of C5-6 of effecting mild myelomalacia.  Posterior Fossa, vertebral arteries, paraspinal tissues: Posterior  fossa demonstrates no focal abnormality. Vertebral artery flow voids  are maintained. Paraspinal soft tissues are unremarkable.  Disc levels:  Discs: Anterior cervical fusion at C5-6. Degenerative disease with  disc height loss at C6-7, C7-T1, T1-2 and T2-3.  C2-3: No significant disc bulge. No neural foraminal stenosis. No  central canal stenosis.  C3-4: Broad-based disc osteophyte complex with a small central disc  protrusion. Bilateral uncovertebral degenerative changes. Severe  bilateral foraminal stenosis. No spinal stenosis.  C4-5: Broad-based disc bulge with  a small central disc protrusion.  Bilateral uncovertebral degenerative changes. Mild bilateral  foraminal stenosis. No spinal stenosis.  C5-6: Interbody fusion. Bilateral uncovertebral degenerative  changes. Moderate-severe bilateral foraminal stenosis. No spinal  stenosis.  C6-7: Broad shallow left paracentral disc protrusion. No foraminal  or central canal stenosis.  C7-T1: No significant disc bulge. No neural foraminal stenosis. No  central canal stenosis.  MRI THORACIC SPINE FINDINGS  Alignment: Physiologic.  Vertebrae: No acute fracture or aggressive bone lesion. Nonspecific  bone marrow edema in the T1 and T2 vertebral bodies and to lesser  extent superior aspect of the T3 vertebral body with fluid signal in  the T2-3 disc space. Fluid signal in the T10-11 disc space with bone  marrow edema throughout the T10 and T11 vertebral bodies.  Cord: Normal signal and morphology.  Paraspinal and other soft tissues: No acute paraspinal abnormality.  Heterogeneous appearance of the thyroid. 16 mm left thyroid T2  hyperintense nodule. This has been evaluated by ultrasound performed  at [facility] on 08/30/2021.  Disc levels:  Disc spaces: Degenerative disease with disc height loss throughout  the thoracic spine.  T1-T2: No disc protrusion, foraminal stenosis or central canal  stenosis.  T2-T3: No disc protrusion, foraminal stenosis or central canal  stenosis.  T3-T4: No disc protrusion, foraminal stenosis or central canal  stenosis.  T4-T5: No disc protrusion, foraminal stenosis or central canal  stenosis.  T5-T6: No disc protrusion, foraminal stenosis or central canal  stenosis.  T6-T7: No disc protrusion, foraminal stenosis or central canal  stenosis.  T7-T8: No disc protrusion, foraminal stenosis or central canal  stenosis.  T8-T9: No disc protrusion, foraminal stenosis or central canal  stenosis.  T9-T10: No disc protrusion, foraminal stenosis or central canal   stenosis.  T10-T11: No disc protrusion, foraminal stenosis or central canal  stenosis.  T11-T12: No disc protrusion, foraminal stenosis or central canal  stenosis.  MRI LUMBAR SPINE FINDINGS  Segmentation: Standard.  Alignment: Physiologic.  Vertebrae: No acute fracture, evidence of discitis, or aggressive  bone lesion. L2 vertebral body hemangioma.  Conus medullaris and cauda equina: Conus extends to the T12 level.  Conus and cauda equina appear normal.  Paraspinal and other soft tissues: No acute paraspinal abnormality.  Disc levels:  Disc spaces:  Degenerative disease with disc height loss at L5-S1.  T12-L1: No significant disc bulge. No neural foraminal stenosis. No  central canal stenosis.  L1-L2: No significant disc bulge. No neural foraminal stenosis. No  central canal stenosis.  L2-L3: No significant disc bulge. No neural foraminal stenosis. No  central canal stenosis.  L3-L4: No significant disc bulge. No neural foraminal stenosis. No  central canal stenosis.  L4-L5: Mild broad-based disc bulge. Mild bilateral facet  arthropathy. No foraminal or central canal stenosis.  L5-S1: Mild broad-based disc bulge. No foraminal or central canal  stenosis.  IMPRESSION:  1. Bone marrow edema in the T1 and T2 vertebral bodies and to lesser  extent superior aspect of the T3 vertebral body with fluid signal in  the T2-3 disc space. Fluid signal in the T10-11 disc space with bone  marrow edema throughout the T10 and T11 vertebral bodies. These  findings are most concerning for discitis-osteomyelitis. Recommend  correlation with laboratory values.  2. No acute osseous injury of the cervical spine. Cervical spine  spondylosis as described above.  3. No acute osseous injury of the thoracic spine.  4. No acute osseous injury of the lumbar spine.   HISTORY:   Past Medical History:  Diagnosis Date   Abdominal pain, epigastric    Accident occurring in home 05/07/2016   Adrenal adenoma  03/16/2018   Anxiety and depression 03/16/2018   Cervicogenic headache 10/14/2016   Chronic back pain    Constipation 04/08/2021   Dizziness 10/14/2016   Elevated lipase 01/13/2017   Formatting of this note might be different from the original. 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 Dr Harl Favor [place]    COLONOSCOPY  09/05/2020   Dr Orlena Sheldon Benign neoplasm of transverse colon. Benign neoplasm of descending colon.   ESOPHAGOGASTRODUODENOSCOPY  02/2018   ESOPHAGOGASTRODUODENOSCOPY (EGD) WITH PROPOFOL N/A 03/16/2018   Procedure: ESOPHAGOGASTRODUODENOSCOPY (EGD) WITH PROPOFOL;  Surgeon: Ladene Artist, MD;  Location: [place];  Service: Endoscopy;  Laterality: N/A;   NECK SURGERY     PARTIAL HYSTERECTOMY     TOE SURGERY Bilateral     Family History  Problem Relation Age of Onset   Lung cancer Father    Heart disease Father    Hypertension Father    Heart disease Mother    Hypertension Mother    Cancer Mother     Social History:  reports that she has quit smoking. She has never used smokeless tobacco. She reports current drug use. Drug:  Marijuana. She reports that she does not drink alcohol.The patient is alone today.  Allergies:  Allergies  Allergen  Reactions   Atorvastatin Anaphylaxis   Codeine Nausea Only, Nausea And Vomiting and Shortness Of Breath    Other reaction(s): GI Upset (intolerance)   Zestril [Lisinopril] Anaphylaxis and Swelling    Current Medications: Current Outpatient Medications  Medication Sig Dispense Refill   oxycodone (OXY-IR) 5 MG capsule Take 5 mg by mouth every 6 (six) hours as needed.     acetaminophen (TYLENOL) 500 MG tablet Take 500 mg by mouth every 6 (six) hours as needed for mild pain.     apixaban (ELIQUIS) 5 MG TABS tablet Take 1 tablet (5 mg total) by mouth 2 (two) times daily. 60 tablet 0   baclofen (LIORESAL) 10 MG tablet Take 10 mg by mouth 2 (two) times daily.     cefTRIAXone (ROCEPHIN) 10 g injection      dicyclomine (BENTYL) 20 MG tablet Take 20 mg by mouth 4 (four) times daily as needed for spasms.     DULoxetine (CYMBALTA) 60 MG capsule Take 60 mg by mouth daily.     gabapentin (NEURONTIN) 300 MG capsule Take 300 mg by mouth at bedtime.     HYDROcodone-acetaminophen (NORCO/VICODIN) 5-325 MG tablet Take by mouth.     meclizine (ANTIVERT) 25 MG tablet Take 25 mg by mouth 4 (four) times daily as needed for dizziness.     morphine (MSIR) 15 MG tablet Take by mouth.     ondansetron (ZOFRAN) 4 MG tablet Take 1 tablet (4 mg total) by mouth every 6 (six) hours. 12 tablet 0   ondansetron (ZOFRAN-ODT) 4 MG disintegrating tablet Take 4 mg by mouth 2 (two) times daily as needed for nausea or vomiting.     pantoprazole (PROTONIX) 40 MG tablet Take 40 mg by mouth daily.     sucralfate (CARAFATE) 1 g tablet Take 1 g by mouth daily.     Current Facility-Administered Medications  Medication Dose Route Frequency Provider Last Rate Last Admin   sodium chloride flush (NS) 0.9 % injection 10 mL  10 mL Intracatheter PRN [place] Scrape A, NP   10 mL at 10/31/21 1111

## 2021-10-31 NOTE — Assessment & Plan Note (Addendum)
History of neuroendocrine/carcinoid tumor of the ascending colon, diagnosed in October 2016, treated with surgical resection. Seven of eighteen lymph nodes were involved (7/18). Primary tumor measured 2.2 x 1.7 x 1.5 cm, with three apparent vascular metastases including a nodule up to 3 cm with metastatic tumor present within 1 mm of the inked radial margin. In the distal appendix there was also a separate 3 mm carcinoid tumor. This was a staged as a pT3pN1 with low mitotic rate (0 per 10 high powered fields).   [name] was sporadic.  We began seeing her in February 2022 for carcinoid syndrome symptoms.  Her chromogranin A was elevated at 262.  CT chest, abdomen and pelvis did not reveal any evidence of metastatic disease.  She was started on octreotide every 4 weeks.  MRI lumbar spine in March revealed progressive disc space narrowing at L5-S1with unchanged mild bilateral neural foraminal stenosis; unchanged mild lateral recess and neural foraminal stenosis at L3-4 and L4-5.  Chromogranin A remains normal in April.  She has had multiple hospitalizations in the past couple of months.  She was admitted with sepsis in May.  She has had difficulty with constipation, and in June, she underwent enemas and disimpaction in the ER.  She had been on oral iron supplement, but this was discontinued.  She was admitted in June for osteomyelitis and continues on IV antibiotics at home.  She has a PICC in place.  Her hemoglobin is normal today, so she will remain off oral iron.  She will proceed with octreotide this week and return to clinic in 4 weeks with a CBC and comprehensive metabolic panel prior to her next octreotide.

## 2021-10-31 NOTE — Patient Instructions (Signed)
Octreotide injection solution ?What is this medication? ?OCTREOTIDE (ok TREE oh tide) is used to reduce blood levels of growth hormone in patients with a condition called acromegaly. This medicine also reduces flushing and watery diarrhea caused by certain types of cancer. ?This medicine may be used for other purposes; ask your health care provider or pharmacist if you have questions. ?COMMON BRAND NAME(S): Bynfezia, Sandostatin ?What should I tell my care team before I take this medication? ?They need to know if you have any of these conditions: ?diabetes ?gallbladder disease ?kidney disease ?liver disease ?thyroid disease ?an unusual or allergic reaction to octreotide, other medicines, foods, dyes, or preservatives ?pregnant or trying to get pregnant ?breast-feeding ?How should I use this medication? ?This medication is injected under the skin or into a vein. It is usually given by your care team in a hospital or clinic setting. ?If you get this medication at home, you will be taught how to prepare and give it. Use exactly as directed. Take it as directed on the prescription label at the same time every day. Keep taking it unless your care team tells you to stop. ?Allow the injection solution to come to room temperature before use. Do not warm it artificially. ?It is important that you put your used needles and syringes in a special sharps container. Do not put them in a trash can. If you do not have a sharps container, call your pharmacist or care team to get one. ?Talk to your care team about the use of this medication in children. Special care may be needed. ?Overdosage: If you think you have taken too much of this medicine contact a poison control center or emergency room at once. ?NOTE: This medicine is only for you. Do not share this medicine with others. ?What if I miss a dose? ?If you miss a dose, take it as soon as you can. If it is almost time for your next dose, take only that dose. Do not take double  or extra doses. ?What may interact with this medication? ?bromocriptine ?certain medicines for blood pressure, heart disease, irregular heartbeat ?cyclosporine ?diuretics ?medicines for diabetes, including insulin ?quinidine ?This list may not describe all possible interactions. Give your health care provider a list of all the medicines, herbs, non-prescription drugs, or dietary supplements you use. Also tell them if you smoke, drink alcohol, or use illegal drugs. Some items may interact with your medicine. ?What should I watch for while using this medication? ?Visit your care team for regular checks on your progress. Tell your care team if your symptoms do not start to get better or if they get worse. ?To help reduce irritation at the injection site, use a different site for each injection and make sure the solution is at room temperature before use. ?This medication may cause decreases in blood sugar. Signs of low blood sugar include chills, cool, pale skin or cold sweats, drowsiness, extreme hunger, fast heartbeat, headache, nausea, nervousness or anxiety, shakiness, trembling, unsteadiness, tiredness, or weakness. Contact your care team right away if you experience any of these symptoms. ?This medication may increase blood sugar. The risk may be higher in patients who already have diabetes. Ask your care team what you can do to lower your risk of diabetes while taking this medication. ?You should make sure you get enough vitamin B12 while you are taking this medication. Discuss the foods you eat and the vitamins you take with your care team. ?What side effects may I notice from receiving   this medication? ?Side effects that you should report to your doctor or health care professional as soon as possible: ?allergic reactions like skin rash, itching or hives, swelling of the face, lips, or tongue ?fast, slow, or irregular heartbeat ?right upper belly pain ?severe stomach pain ?signs and symptoms of high blood sugar  such as being more thirsty or hungry or having to urinate more than normal. You may also feel very tired or have blurry vision. ?signs and symptoms of low blood sugar such as feeling anxious; confusion; dizziness; increased hunger; unusually weak or tired; increased sweating; shakiness; cold, clammy skin; irritable; headache; blurred vision; fast heartbeat; loss of consciousness ?unusually weak or tired ?Side effects that usually do not require medical attention (report to your doctor or health care professional if they continue or are bothersome): ?diarrhea ?dizziness ?gas ?headache ?nausea, vomiting ?pain, redness, or irritation at site where injected ?upset stomach ?This list may not describe all possible side effects. Call your doctor for medical advice about side effects. You may report side effects to FDA at 1-800-FDA-1088. ?Where should I keep my medication? ?Keep out of the reach of children and pets. ?Store in the refrigerator. Protect from light. Allow to come to room temperature naturally. Do not use artificial heat. If protected from light, the injection may be stored between 20 and 30 degrees C (70 and 86 degrees F) for 14 days. After the initial use, throw away any unused portion of a multiple dose vial after 14 days. Get rid of any unused portions of the ampules after use. ?To get rid of medications that are no longer needed or have expired: ?Take the medication to a medication take-back program. Ask your pharmacy or law enforcement to find a location. ?If you cannot return the medication, ask your pharmacist or care team how to get rid of the medication safely. ?NOTE: This sheet is a summary. It may not cover all possible information. If you have questions about this medicine, talk to your doctor, pharmacist, or health care provider. ?? 2023 [employer] (2021-04-05 00:00:00) ? ?

## 2021-10-31 NOTE — Telephone Encounter (Signed)
Per 10/31/21 los next appt scheduled and confirmed with patient 

## 2021-11-01 LAB — CHROMOGRANIN A: Chromogranin A (ng/mL): 23.4 ng/mL (ref 0.0–101.8)

## 2021-11-07 ENCOUNTER — Other Ambulatory Visit: Payer: Self-pay

## 2021-11-07 DIAGNOSIS — R079 Chest pain, unspecified: Secondary | ICD-10-CM | POA: Insufficient documentation

## 2021-11-07 DIAGNOSIS — R200 Anesthesia of skin: Secondary | ICD-10-CM | POA: Insufficient documentation

## 2021-11-07 DIAGNOSIS — E785 Hyperlipidemia, unspecified: Secondary | ICD-10-CM | POA: Insufficient documentation

## 2021-11-07 DIAGNOSIS — G609 Hereditary and idiopathic neuropathy, unspecified: Secondary | ICD-10-CM | POA: Insufficient documentation

## 2021-11-07 DIAGNOSIS — G2581 Restless legs syndrome: Secondary | ICD-10-CM | POA: Insufficient documentation

## 2021-11-07 DIAGNOSIS — E7800 Pure hypercholesterolemia, unspecified: Secondary | ICD-10-CM | POA: Insufficient documentation

## 2021-11-07 DIAGNOSIS — Z6826 Body mass index (BMI) 26.0-26.9, adult: Secondary | ICD-10-CM | POA: Insufficient documentation

## 2021-11-14 ENCOUNTER — Encounter: Payer: Self-pay | Admitting: Cardiology

## 2021-11-14 ENCOUNTER — Ambulatory Visit (INDEPENDENT_AMBULATORY_CARE_PROVIDER_SITE_OTHER): Payer: Medicare HMO | Admitting: Cardiology

## 2021-11-14 VITALS — BP 100/74 | HR 84 | Ht 64.0 in | Wt 154.4 lb

## 2021-11-14 DIAGNOSIS — E7800 Pure hypercholesterolemia, unspecified: Secondary | ICD-10-CM | POA: Diagnosis not present

## 2021-11-14 DIAGNOSIS — C7A012 Malignant carcinoid tumor of the ileum: Secondary | ICD-10-CM | POA: Diagnosis not present

## 2021-11-14 DIAGNOSIS — Z86711 Personal history of pulmonary embolism: Secondary | ICD-10-CM | POA: Diagnosis not present

## 2021-11-14 DIAGNOSIS — R079 Chest pain, unspecified: Secondary | ICD-10-CM | POA: Diagnosis not present

## 2021-11-14 NOTE — Patient Instructions (Signed)
Medication Instructions:  Your physician recommends that you continue on your current medications as directed. Please refer to the Current Medication list given to you today.  *If you need a refill on your cardiac medications before your next appointment, please call your pharmacy*   Lab Work: None ordered If you have labs (blood work) drawn today and your tests are completely normal, you will receive your results only by: [place] (if you have MyChart) OR A paper copy in the mail If you have any lab test that is abnormal or we need to change your treatment, we will call you to review the results.   Testing/Procedures: Your physician has requested that you have a lexiscan myoview. For further information please visit [email or web address]. Please follow instruction sheet, as given.  The test will take approximately 3 to 4 hours to complete; you may bring reading material.  If someone comes with you to your appointment, they will need to remain in the main lobby due to limited space in the testing area.   How to prepare for your Myocardial Perfusion Test: Do not eat or drink 3 hours prior to your test, except you may have water. Do not consume products containing caffeine (regular or decaffeinated) 12 hours prior to your test. (ex: coffee, chocolate, sodas, tea). Do bring a list of your current medications with you.  If not listed below, you may take your medications as normal. Do wear comfortable clothes (no dresses or overalls) and walking shoes, tennis shoes preferred (No heels or open toe shoes are allowed). Do NOT wear cologne, perfume, aftershave, or lotions (deodorant is allowed). If these instructions are not followed, your test will have to be rescheduled.  Your physician has requested that you have an echocardiogram. Echocardiography is a painless test that uses sound waves to create images of your heart. It provides your doctor with information about the size and shape of  your heart and how well your heart's chambers and valves are working. This procedure takes approximately one hour. There are no restrictions for this procedure.   Follow-Up: At [facility], you and your health needs are our priority.  As part of our continuing mission to provide you with exceptional heart care, we have created designated Provider Care Teams.  These Care Teams include your primary Cardiologist (physician) and Advanced Practice Providers (APPs -  Physician Assistants and Nurse Practitioners) who all work together to provide you with the care you need, when you need it.  We recommend signing up for the patient portal called "MyChart".  Sign up information is provided on this After Visit Summary.  MyChart is used to connect with patients for Virtual Visits (Telemedicine).  Patients are able to view lab/test results, encounter notes, upcoming appointments, etc.  Non-urgent messages can be sent to your provider as well.   To learn more about what you can do with MyChart, go to [email or web address].    Your next appointment:   6 month(s)  The format for your next appointment:   In Person  Provider:   Jyl Heinz, MD   Other Instructions Cardiac Nuclear Scan A cardiac nuclear scan is a test that is done to check the flow of blood to your heart. It is done when you are resting and when you are exercising. The test looks for problems such as: Not enough blood reaching a portion of the heart. The heart muscle not working as it should. You may need this test if: You have heart disease. You have  had lab results that are not normal. You have had heart surgery or a balloon procedure to open up blocked arteries (angioplasty). You have chest pain. You have shortness of breath. In this test, a special dye (tracer) is put into your bloodstream. The tracer will travel to your heart. A camera will then take pictures of your heart to see how the tracer moves through your heart. This  test is usually done at a hospital and takes 2-4 hours. Tell a doctor about: Any allergies you have. All medicines you are taking, including vitamins, herbs, eye drops, creams, and over-the-counter medicines. Any problems you or family members have had with anesthetic medicines. Any blood disorders you have. Any surgeries you have had. Any medical conditions you have. Whether you are pregnant or may be pregnant. What are the risks? Generally, this is a safe test. However, problems may occur, such as: Serious chest pain and heart attack. This is only a risk if the stress portion of the test is done. Rapid heartbeat. A feeling of warmth in your chest. This feeling usually does not last long. Allergic reaction to the tracer. What happens before the test? Ask your doctor about changing or stopping your normal medicines. This is important. Follow instructions from your doctor about what you cannot eat or drink. Remove your jewelry on the day of the test. What happens during the test? An IV tube will be inserted into one of your veins. Your doctor will give you a small amount of tracer through the IV tube. You will wait for 20-40 minutes while the tracer moves through your bloodstream. Your heart will be monitored with an electrocardiogram (ECG). You will lie down on an exam table. Pictures of your heart will be taken for about 15-20 minutes. You may also have a stress test. For this test, one of these things may be done: You will be asked to exercise on a treadmill or a stationary bike. You will be given medicines that will make your heart work harder. This is done if you are unable to exercise. When blood flow to your heart has peaked, a tracer will again be given through the IV tube. After 20-40 minutes, you will get back on the exam table. More pictures will be taken of your heart. Depending on the tracer that is used, more pictures may need to be taken 3-4 hours later. Your IV tube  will be removed when the test is over. The test may vary among doctors and hospitals. What happens after the test? Ask your doctor: Whether you can return to your normal schedule, including diet, activities, and medicines. Whether you should drink more fluids. This will help to remove the tracer from your body. Drink enough fluid to keep your pee (urine) pale yellow. Ask your doctor, or the department that is doing the test: When will my results be ready? How will I get my results? Summary A cardiac nuclear scan is a test that is done to check the flow of blood to your heart. Tell your doctor whether you are pregnant or may be pregnant. Before the test, ask your doctor about changing or stopping your normal medicines. This is important. Ask your doctor whether you can return to your normal activities. You may be asked to drink more fluids. This information is not intended to replace advice given to you by your health care provider. Make sure you discuss any questions you have with your health care provider. Document Revised: 08/04/2018 Document Reviewed: 09/28/2017   Elsevier Patient Education  2021 Elsevier Inc.    Echocardiogram An echocardiogram is a test that uses sound waves (ultrasound) to produce images of the heart. Images from an echocardiogram can provide important information about: Heart size and shape. The size and thickness and movement of your heart's walls. Heart muscle function and strength. Heart valve function or if you have stenosis. Stenosis is when the heart valves are too narrow. If blood is flowing backward through the heart valves (regurgitation). A tumor or infectious growth around the heart valves. Areas of heart muscle that are not working well because of poor blood flow or injury from a heart attack. Aneurysm detection. An aneurysm is a weak or damaged part of an artery wall. The wall bulges out from the normal force of blood pumping through the body. Tell a  health care provider about: Any allergies you have. All medicines you are taking, including vitamins, herbs, eye drops, creams, and over-the-counter medicines. Any blood disorders you have. Any surgeries you have had. Any medical conditions you have. Whether you are pregnant or may be pregnant. What are the risks? Generally, this is a safe test. However, problems may occur, including an allergic reaction to dye (contrast) that may be used during the test. What happens before the test? No specific preparation is needed. You may eat and drink normally. What happens during the test? You will take off your clothes from the waist up and put on a hospital gown. Electrodes or electrocardiogram (ECG)patches may be placed on your chest. The electrodes or patches are then connected to a device that monitors your heart rate and rhythm. You will lie down on a table for an ultrasound exam. A gel will be applied to your chest to help sound waves pass through your skin. A handheld device, called a transducer, will be pressed against your chest and moved over your heart. The transducer produces sound waves that travel to your heart and bounce back (or "echo" back) to the transducer. These sound waves will be captured in real-time and changed into images of your heart that can be viewed on a video monitor. The images will be recorded on a computer and reviewed by your health care provider. You may be asked to change positions or hold your breath for a short time. This makes it easier to get different views or better views of your heart. In some cases, you may receive contrast through an IV in one of your veins. This can improve the quality of the pictures from your heart. The procedure may vary among health care providers and hospitals.    What can I expect after the test? You may return to your normal, everyday life, including diet, activities, and medicines, unless your health care provider tells you not to do  that. Follow these instructions at home: It is up to you to get the results of your test. Ask your health care provider, or the department that is doing the test, when your results will be ready. Keep all follow-up visits. This is important. Summary An echocardiogram is a test that uses sound waves (ultrasound) to produce images of the heart. Images from an echocardiogram can provide important information about the size and shape of your heart, heart muscle function, heart valve function, and other possible heart problems. You do not need to do anything to prepare before this test. You may eat and drink normally. After the echocardiogram is completed, you may return to your normal, everyday life, unless your   health care provider tells you not to do that. This information is not intended to replace advice given to you by your health care provider. Make sure you discuss any questions you have with your health care provider. Document Revised: 12/06/2019 Document Reviewed: 12/06/2019 Elsevier Patient Education  2021 Elsevier Inc.  

## 2021-11-14 NOTE — Progress Notes (Signed)
Cardiology Office Note:    Date:  11/14/2021   ID:  Kristi Romero, DOB 06/21/1955, MRN [id number]  PCP:  Barnetta Chapel, NP  Cardiologist:  Jenean Lindau, MD   Referring MD: Barnetta Chapel, NP    ASSESSMENT:    1. Hypercholesteremia   2. Malignant carcinoid tumor of ileum (HCC)   3. Chest pain, unspecified type   4. History of pulmonary embolism    PLAN:    In order of problems listed above:  Primary prevention stressed to the patient.  Importance of compliance with diet and medication stressed and she vocalized understanding. Chest pain: Atypical in nature.  However in view of risk factors we will do a Lexiscan sestamibi.  She is agreeable Cardiac murmur: Echocardiogram will be done to assess murmur heard on auscultation. History of intestinal edema.  She does not give much details of this.  This is followed by her primary care. She knows to go to the nearest emergency room for any significant problems.Patient will be seen in follow-up appointment in 6 months or earlier if the patient has any concerns    Medication Adjustments/Labs and Tests Ordered: Current medicines are reviewed at length with the patient today.  Concerns regarding medicines are outlined above.  No orders of the defined types were placed in this encounter.  No orders of the defined types were placed in this encounter.    History of Present Illness:    Kristi Romero is a 66 y.o. female who is being seen today for the evaluation of chest pain at the request of Barnetta Chapel, NP.  Patient has past medical history of tumor of the colon.  This has been resected.  I am not completely aware of the follow-up or the status of the situation.  Mentions to me that she is referred here for chest pain.  It happened to her only once.  She cannot describe it to back.  No orthopnea or PND.  She subsequently she has not any such issues.  She leads a sedentary lifestyle. Past Medical History:  Diagnosis Date    Abdominal pain, epigastric    Accident occurring in home 05/07/2016   Acute respiratory failure with hypoxia ([place]) 09/06/2021   Adrenal adenoma 03/16/2018   Anxiety and depression 03/16/2018   BMI 26.0-26.9,adult    Cervicogenic headache 10/14/2016   Chest pain, unspecified    Chronic abdominal pain and nausea  09/06/2021   Chronic back pain    Constipation 04/08/2021   Dizziness 10/14/2016   Elevated lipase 01/13/2017   Formatting of this note might be different from the original. 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   Normocytic anemia 09/06/2021   Numbness    Numbness and tingling 10/14/2016   Pulmonary embolism with acute respiratory failure with hypoxia  09/06/2021   Restless leg syndrome    Sleeping difficulty 04/18/2016   Spondylosis, cervical, 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 Dr Harl Favor [place]    COLONOSCOPY  09/05/2020   Dr Orlena Sheldon Benign neoplasm of transverse colon. Benign neoplasm of descending colon.   ESOPHAGOGASTRODUODENOSCOPY  02/2018   ESOPHAGOGASTRODUODENOSCOPY (EGD) WITH PROPOFOL N/A 03/16/2018   Procedure: ESOPHAGOGASTRODUODENOSCOPY  (EGD) WITH PROPOFOL;  Surgeon: Ladene Artist, MD;  Location: [place];  Service: Endoscopy;  Laterality: N/A;   NECK SURGERY     PARTIAL HYSTERECTOMY     TOE SURGERY Bilateral     Current Medications: Current Meds  Medication Sig   acetaminophen (TYLENOL) 500 MG tablet Take 500 mg by mouth every 6 (six) hours as needed for mild pain.   apixaban (ELIQUIS) 5 MG TABS tablet Take 1 tablet (5 mg total) by mouth 2 (two) times daily.   baclofen (LIORESAL) 10 MG tablet Take 10 mg by mouth 2 (two) times daily.   dicyclomine (BENTYL) 20 MG tablet Take 20 mg by mouth 4 (four) times daily as needed for spasms.   DULoxetine (CYMBALTA) 60 MG capsule Take 60 mg by mouth daily.   gabapentin (NEURONTIN) 300 MG capsule Take 300 mg by mouth at bedtime.   HYDROcodone-acetaminophen (NORCO/VICODIN) 5-325 MG tablet Take 1 tablet by mouth as needed for pain.   LINZESS 290 MCG CAPS capsule Take 290 mcg by mouth daily.   meclizine (ANTIVERT) 25 MG tablet Take 25 mg by mouth 4 (four) times daily as needed for dizziness.   mirtazapine (REMERON) 15 MG tablet Take 15 mg by mouth at bedtime.   morphine (MSIR) 15 MG tablet Take 15 mg by mouth as needed for severe pain.   ondansetron (ZOFRAN) 4 MG tablet Take 1 tablet (4 mg total) by mouth every 6 (six) hours.   ondansetron (ZOFRAN-ODT) 4 MG disintegrating tablet Take 4 mg by mouth 2 (two) times daily as needed for nausea or vomiting.   oxycodone (OXY-IR) 5 MG capsule Take 5 mg by mouth every 6 (six) hours as needed for pain.   pantoprazole (PROTONIX) 40 MG tablet Take 40 mg by mouth daily.   sucralfate (CARAFATE) 1 g tablet Take 1 g by mouth daily.     Allergies:   Atorvastatin, Codeine, and Zestril [lisinopril]   Social History   Socioeconomic History   Marital status: Divorced    Spouse name: Not on file   Number of children: 3   Years of education: 9   Highest education level: Not on file  Occupational History   Occupation: Disabled  Tobacco Use    Smoking status: Former   Smokeless tobacco: Never   Tobacco comments:    quit long time ago   Scientific laboratory technician Use: Never used  Substance and Sexual Activity   Alcohol use: No    Alcohol/week: 0.0 standard drinks of alcohol   Drug use: Yes    Types: Marijuana    Comment: Currently smokes about 7 marijuana "joints" per week.   Sexual activity: Not Currently  Other Topics Concern   Not on file  Social History Narrative   Lives at home alone.   Right-handed.   1 cup caffeine per day.   Social Determinants of Health   Financial Resource Strain: Not on file  Food Insecurity:  Not on file  Transportation Needs: Not on file  Physical Activity: Not on file  Stress: Not on file  Social Connections: Not on file     Family History: The patient's family history includes Cancer in her mother; Heart disease in her father and mother; Hypertension in her father and mother; Lung cancer in her father.  ROS:   Please see the history of present illness.    All other systems reviewed and are negative.  EKGs/Labs/Other Studies Reviewed:    The following studies were reviewed today: EKG reveals sinus rhythm with septal infarction and specific ST-T changes   Recent Labs: 09/06/2021: TSH 1.600 10/02/2021: B Natriuretic Peptide 15.6; Magnesium 1.9 10/31/2021: ALT 76; BUN 7; Creatinine 0.6; Hemoglobin 12.0; Platelets 234; Potassium 3.3; Sodium 140  Recent Lipid Panel No results found for: "CHOL", "TRIG", "HDL", "CHOLHDL", "VLDL", "LDLCALC", "LDLDIRECT"  Physical Exam:    VS:  BP 100/74   Pulse 84   Ht '5\' 4"'$  (1.626 m)   Wt 154 lb 6.4 oz (70 kg)   LMP  (LMP Unknown)   SpO2 97%   BMI 26.50 kg/m     Wt Readings from Last 3 Encounters:  11/14/21 154 lb 6.4 oz (70 kg)  10/31/21 156 lb 12 oz (71.1 kg)  10/31/21 156 lb 3.2 oz (70.9 kg)     GEN: Patient is in no acute distress HEENT: Normal NECK: No JVD; No carotid bruits LYMPHATICS: No lymphadenopathy CARDIAC: S1 S2 regular, 2/6  systolic murmur at the apex. RESPIRATORY:  Clear to auscultation without rales, wheezing or rhonchi  ABDOMEN: Soft, non-tender, non-distended MUSCULOSKELETAL:  No edema; No deformity  SKIN: Warm and dry NEUROLOGIC:  Alert and oriented x 3 PSYCHIATRIC:  Normal affect    Signed, Jenean Lindau, MD  11/14/2021 2:13 PM    [facility]

## 2021-11-26 ENCOUNTER — Telehealth: Payer: Self-pay | Admitting: *Deleted

## 2021-11-26 NOTE — Telephone Encounter (Signed)
Patient aid Butch Penny per DPR was given detailed instructions per Myocardial Perfusion Study Information Sheet for the test on 12/03/21 at 0800. Patient notified to arrive 15 minutes early and that it is imperative to arrive on time for appointment to keep from having the test rescheduled.  If you need to cancel or reschedule your appointment, please call the office within 24 hours of your appointment. . Patient verbalized understanding.Aquinnah Devin, Ranae Palms

## 2021-11-27 NOTE — Progress Notes (Signed)
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  Seven of eighteen lymph nodes were involved (7/18).  Primary tumor measured 2.2 x 1.7 x 1.5 cm, with three apparent vascular metastases including a nodule up to 3 cm with metastatic tumor present within 1 mm of the inked radial margin.  In the distal appendix there was also a separate 3 mm carcinoid tumor.  This was a staged as a pT3pN1 with low mitotic rate (0 per 10 high powered fields).    [name] was sporadic.  We began seeing her in February 2022 for carcinoid syndrome symptoms.  Her chromogranin A was elevated at 262.  CT chest, abdomen and pelvis did not reveal any evidence of metastatic disease.  She was started on octreotide every 4 weeks.  She had normalization of her chromogranin A.  CT chest, abdomen and pelvis in the last several months did not reveal any evidence of metastasis.  Chromogranin A remained normal in July.  She will proceed with octreotide this week and return to clinic in 4 weeks with a CBC and comprehensive metabolic panel prior to her next octreotide.  History of pulmonary embolism She was found to have right lower lobe subsegmental nonocclusive pulmonary embolism incidentally in May, for which she was placed on Eliquis twice daily.  She states she saw Dr. Andria Frames yesterday and he recommended continuing Eliquis for at least 6 months.  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  She has had previous surgery in her neck.  This is not a primary neurologic problem, so we are managing her symptoms.  She continues to have persistent symptoms despite gabapentin,  so is using oxycodone 5 mg as needed.   The patient understands the plans discussed today and is in agreement with them.  She knows to contact our office if she develops concerns prior to her next appointment.   We will plan to see her back in 4 weeks as above.  I provided 20 minutes of face-to-face time during this encounter and > 50% was spent counseling as documented under my assessment and plan.    Kristi Pickles, PA-C  [facility] [address] [place] [address] Dept: [phone] Dept Fax: [phone]   Orders Placed This Encounter  Procedures   Basic metabolic panel    This external order was created through the Results Console.   Comprehensive metabolic panel    This external order was created through the Results Console.   Hepatic function panel    This external order was created through the Results Console.   Protein, total    This order was created through External Result Entry   CBC and differential    This external order was created through the Results Console.   CBC    This external order was created through the Results Console.      CHIEF COMPLAINT:  CC: Malignant carcinoid  Current Treatment:  Octreotide every 4 weeks  HISTORY OF PRESENT ILLNESS:  Kristi Romero is a 66 year old. female referred by Dr. Melina Copa for a transfer of care and further management of her history of neuroendocrine and carcinoid  tumor, which was diagnosed in October 2016.  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  She underwent surgical resection with Dr. Zada Girt. Pathology confirmed well differentiated neuroendocrine tumor consistent with carcinoid tumor.  Seven of  eighteen lymph nodes were involved (7/18).  Primary tumor measured 2.2 x 1.7 x 1.5 cm, with three apparent vascular metastases including a nodule up to 3 cm with metastatic tumor present within 1 mm of the inked radial margin.  In the distal appendix there was also a separate 3 mm carcinoid tumor.  This was a staged as a pT3pN1 with low mitotic rate (0 per 10 high powered fields).  She did require a PEG tube in 2017 for failure to thrive and this was removed in January 2018.  Her weight was up to 157 by September 2021.     Her last follow up was in January 2020 with Dr. Verl Blalock until we started seeing her in early February 2022.  Kristi Romero complained of weight loss, stomach pain and nausea.  She was evaluated by Dr. Melina Copa with endoscopy in November 2021 which revealed chronic gastritis and was negative for H.Pylori.  No evidence of malignancy was observed.  She also notes bilateral numbness "from her mouth to her feet".   She receives B12 injections every 6 months.  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  Chromogranin A from February was elevated at 262.3. She also had an elevated serum protein. Serum protein electrophoresis did not reveal a monoclonal spike. CT imaging revealed adrenal adenomas but no obvious evidence of recurrent/progressive disease.  She was started on octreotide injection in March 2022 with good response based on her decreasing chromogranin A.  CT chest, abdomen and pelvis in August revealed postoperative changes of right hemicolectomy with enterocolonic anastomosis without evidence of local recurrence or abdominopelvic metastatic disease.  Stable left adrenal gland adenoma.  MRI brain in August revealed evidence of chronic ischemia. She had normalization of the Chromogranin A and it has remained normal.   She had pre-existing neuropathy, so was referred to neurology and diagnosed with myelomalacia of the cervical spine.  She last saw him  in November, at which time she was not taking the gabapentin he prescribed.  He started her back on gabapentin and recommended she continue follow-up with [place].  The gabapentin has been titrated up to 600 mg 3 times a day.  MRI lumbar spine in March revealed progressive disc space narrowing at L5-S1 with unchanged mild bilateral neural foraminal stenosis; unchanged mild lateral recess and neural foraminal stenosis at L3-4 and L4-5.     She has had multiple ER visits and hospitalizations in the past several of months.  She was admitted with sepsis in May.  She was found to have a right lower lobe subsegmental, nonocclusive pulmonary embolism in May.  She continues Eliquis for this.  She has had difficulty with constipation, and in June, she underwent enemas and disimpaction in the ER.  She has not had evidence of metastatic disease on CT chest, abdomen and pelvis in the last several months.  She had been on oral iron supplement, but this was discontinued.  The chromogranin A remains normal in July.  She continues octreotide every 4 weeks.  Oncology History  Malignant carcinoid tumor of ileum ([place])  04/28/2014 Initial Diagnosis   Malignant carcinoid tumor of ileum ([place])   02/02/2015 Cancer Staging   Staging form: Neuroendocrine Tumor - Duodenum/Ampulla/Jejunum/Illeum, AJCC 7th Edition - Clinical stage from 02/02/2015: Stage IIIB (T3(3), N1, M0) - Signed by Derwood Kaplan, MD on 05/28/2020 Staged by: Managing physician Diagnostic confirmation: Positive histology Specimen type: Excision Histopathologic type: Carcinoid tumor, NOS Stage prefix: Initial diagnosis Tumor size (mm): 22 Multiple tumors: Yes Number of tumors: 3 Histologic grade (G): G1 Lymph-vascular invasion (LVI): LVI not present (absent)/not identified Residual tumor (R): R0 - None Stage used in treatment planning: Yes National guidelines used in treatment planning: Yes Type of national guideline used in treatment planning:  NCCN Staging comments: Surgical resection       INTERVAL HISTORY:  Shaunessy is here today for repeat clinical assessment and continues to complain of her neuropathy with mainly numbness of the bilateral upper and lower extremities.  She has balance difficulties and is in a wheelchair today due to this, but does ambulate with a walker at home.  She also has chronic back pain, which is stable.  She is not sure that the gabapentin has been helping.  She is also on Cymbalta.  She is using oxycodone 5 mg every 6 hours as needed for pain.  She continues to have hot flashes, but denies diarrhea she states she has had upper abdominal pain and nausea without vomiting.  She states she is seeing Dr. Melina Copa for endoscopy.  She continues Eliquis twice daily.  She states she saw Dr. Elicia Lamp yesterday and he recommended continuing anticoagulation for at least 6 months.  She denies fevers or chills. She denies pain. Her appetite is good. Her weight has decreased 2 pounds over last 4 weeks .  REVIEW OF SYSTEMS:  Review of Systems  Constitutional:  Negative for appetite change, chills, fatigue, fever and unexpected weight change.  HENT:   Negative for lump/mass, mouth sores and sore throat.   Respiratory:  Negative for cough and shortness of breath.   Cardiovascular:  Negative for chest pain and leg swelling.  Gastrointestinal:  Positive for abdominal pain and nausea. Negative for constipation, diarrhea and vomiting.  Endocrine: Negative for hot flashes.  Genitourinary:  Negative for difficulty urinating, dysuria, frequency and hematuria.   Musculoskeletal:  Negative for arthralgias, back pain and myalgias.  Skin:  Negative for rash.  Neurological:  Positive for numbness. Negative for dizziness and headaches.  Hematological:  Negative for adenopathy. Does not bruise/bleed easily.  Psychiatric/Behavioral:  Negative for depression and sleep disturbance. The patient is not nervous/anxious.      VITALS:  Blood  pressure 129/70, pulse 69, temperature 98.2 F (36.8 C), resp. rate 14, height '5\' 4"'$  (1.626 m), weight 159 lb 14.4 oz (72.5 kg), SpO2 100 %.  Wt Readings from Last 3 Encounters:  11/28/21 159 lb (72.1 kg)  11/28/21 159 lb 14.4 oz (72.5 kg)  11/14/21 154 lb 6.4 oz (70 kg)    Body mass index is 27.45 kg/m.  Performance status (ECOG): 2 - Symptomatic, <50% confined to bed  PHYSICAL EXAM:  Physical Exam Vitals and nursing note reviewed.  Constitutional:      General: She is not in acute distress.    Appearance: Normal appearance.  HENT:     Head: Normocephalic and atraumatic.     Mouth/Throat:     Mouth: Mucous membranes are moist.     Pharynx: Oropharynx is clear. No oropharyngeal exudate  or posterior oropharyngeal erythema.  Eyes:     General: No scleral icterus.    Extraocular Movements: Extraocular movements intact.     Conjunctiva/sclera: Conjunctivae normal.     Pupils: Pupils are equal, round, and reactive to light.  Cardiovascular:     Rate and Rhythm: Normal rate and regular rhythm.     Heart sounds: Normal heart sounds. No murmur heard.    No friction rub. No gallop.  Pulmonary:     Effort: Pulmonary effort is normal.     Breath sounds: Normal breath sounds. No wheezing, rhonchi or rales.  Abdominal:     General: There is no distension.     Palpations: Abdomen is soft. There is no hepatomegaly, splenomegaly or mass.     Tenderness: There is no abdominal tenderness.  Musculoskeletal:        General: Normal range of motion.     Cervical back: Normal range of motion and neck supple. No tenderness.     Right lower leg: No edema.     Left lower leg: No edema.  Lymphadenopathy:     Cervical: No cervical adenopathy.     Upper Body:     Right upper body: No supraclavicular or axillary adenopathy.     Left upper body: No supraclavicular or axillary adenopathy.     Lower Body: No right inguinal adenopathy. No left inguinal adenopathy.  Skin:    General: Skin is warm and  dry.     Coloration: Skin is not jaundiced.     Findings: No rash.  Neurological:     Mental Status: She is alert and oriented to person, place, and time.     Cranial Nerves: No cranial nerve deficit.  Psychiatric:        Mood and Affect: Mood normal.        Behavior: Behavior normal.        Thought Content: Thought content normal.     LABS:      Latest Ref Rng & Units 11/28/2021   12:00 AM 10/31/2021   12:00 AM 10/03/2021   12:00 AM  CBC  WBC  8.9     6.0     10.5      Hemoglobin 12.0 - 16.0 13.2     12.0     11.6      Hematocrit 36 - 46 40     36     36      Platelets 150 - 400 K/uL 247     234     434         This result is from an external source.      Latest Ref Rng & Units 11/28/2021   12:00 AM 10/31/2021   12:00 AM 10/03/2021   12:00 AM  CMP  BUN 4 - '21 16     7     10      '$ Creatinine 0.5 - 1.1 0.7     0.6     0.7      Sodium 137 - 147 138     140     139      Potassium 3.5 - 5.1 mEq/L 3.8     3.3     3.9      Chloride 99 - 108 102     104     100      CO2 13 - 22 32     30     31  Calcium 8.7 - 10.7 9.9     9.6     9.4      Total Protein 6.3 - 8.2 g/dL 8.5        Alkaline Phos 25 - 125 77     113     115      AST 13 - 35 24     63     29      ALT 7 - 35 U/L 17     76     17         This result is from an external source.     No results found for: "CEA1", "CEA" / No results found for: "CEA1", "CEA" No results found for: "PSA1" No results found for: "EVO350" No results found for: "CAN125"  Lab Results  Component Value Date   TOTALPROTELP 6.6 06/01/2020   ALBUMINELP 3.1 06/01/2020   A1GS 0.3 06/01/2020   A2GS 0.8 06/01/2020   BETS 1.1 06/01/2020   GAMS 1.4 06/01/2020   MSPIKE Not Observed 06/01/2020   SPEI Comment 06/01/2020   Lab Results  Component Value Date   TIBC 244 (L) 09/06/2021   FERRITIN 873 (H) 09/06/2021   IRONPCTSAT 9 (L) 09/06/2021   No results found for: "LDH"  STUDIES:  No results found.    HISTORY:   Past Medical History:   Diagnosis Date   Abdominal pain, epigastric    Accident occurring in home 05/07/2016   Acute respiratory failure with hypoxia ([place]) 09/06/2021   Adrenal adenoma 03/16/2018   Anxiety and depression 03/16/2018   BMI 26.0-26.9,adult    Cervicogenic headache 10/14/2016   Chest pain, unspecified    Chronic abdominal pain and nausea  09/06/2021   Chronic back pain    Constipation 04/08/2021   Dizziness 10/14/2016   Elevated lipase 01/13/2017   Formatting of this note might be different from the original. 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   Normocytic anemia 09/06/2021   Numbness    Numbness and tingling 10/14/2016   Pulmonary embolism with acute respiratory failure with hypoxia  09/06/2021   Restless leg syndrome    Sleeping difficulty 04/18/2016   Spondylosis, cervical, 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 Dr Harl Favor [place]    COLONOSCOPY  09/05/2020   Dr Orlena Sheldon Benign neoplasm of transverse colon. Benign neoplasm of descending colon.   ESOPHAGOGASTRODUODENOSCOPY  02/2018   ESOPHAGOGASTRODUODENOSCOPY (EGD)  WITH PROPOFOL N/A 03/16/2018   Procedure: ESOPHAGOGASTRODUODENOSCOPY (EGD) WITH PROPOFOL;  Surgeon: Ladene Artist, MD;  Location: [facility];  Service: Endoscopy;  Laterality: N/A;  NECK SURGERY     PARTIAL HYSTERECTOMY     TOE SURGERY Bilateral     Family History  Problem Relation Age of Onset   Lung cancer Father    Heart disease Father    Hypertension Father    Heart disease Mother    Hypertension Mother    Cancer Mother     Social History:  reports that she has quit smoking. She has never used smokeless tobacco. She reports current drug use. Drug: Marijuana. She reports that she does not drink alcohol.The patient is alone today.  Allergies:  Allergies  Allergen Reactions   Atorvastatin Anaphylaxis   Codeine Nausea Only, Nausea And Vomiting and Shortness Of Breath    Other reaction(s): GI Upset (intolerance)   Zestril [Lisinopril] Anaphylaxis and Swelling    Current Medications: Current Outpatient Medications  Medication Sig Dispense Refill   acetaminophen (TYLENOL) 500 MG tablet Take 500 mg by mouth every 6 (six) hours as needed for mild pain.     apixaban (ELIQUIS) 5 MG TABS tablet Take 1 tablet (5 mg total) by mouth 2 (two) times daily. 60 tablet 0   baclofen (LIORESAL) 10 MG tablet Take 10 mg by mouth 2 (two) times daily.     dicyclomine (BENTYL) 20 MG tablet Take 20 mg by mouth 4 (four) times daily as needed for spasms.     DULoxetine (CYMBALTA) 60 MG capsule Take 60 mg by mouth daily.     gabapentin (NEURONTIN) 300 MG capsule Take 300 mg by mouth at bedtime.     HYDROcodone-acetaminophen (NORCO/VICODIN) 5-325 MG tablet Take 1 tablet by mouth as needed for pain.     LINZESS 290 MCG CAPS capsule Take 290 mcg by mouth daily.     meclizine (ANTIVERT) 25 MG tablet Take 25 mg by mouth 4 (four) times daily as needed for dizziness.     mirtazapine (REMERON) 15 MG tablet Take 15 mg by mouth at bedtime.     morphine (MSIR) 15 MG tablet Take 15 mg by mouth as needed for  severe pain.     ondansetron (ZOFRAN) 4 MG tablet Take 1 tablet (4 mg total) by mouth every 6 (six) hours. 12 tablet 0   ondansetron (ZOFRAN-ODT) 4 MG disintegrating tablet Take 4 mg by mouth 2 (two) times daily as needed for nausea or vomiting.     oxycodone (OXY-IR) 5 MG capsule Take 5 mg by mouth every 6 (six) hours as needed for pain.     pantoprazole (PROTONIX) 40 MG tablet Take 40 mg by mouth daily.     sucralfate (CARAFATE) 1 g tablet Take 1 g by mouth daily.     No current facility-administered medications for this visit.   Facility-Administered Medications Ordered in Other Visits  Medication Dose Route Frequency Provider Last Rate Last Admin   alteplase (CATHFLO ACTIVASE) injection 2 mg  2 mg Intracatheter Once PRN [name], NP

## 2021-11-27 NOTE — Assessment & Plan Note (Signed)
History of neuroendocrine/carcinoid tumor of the ascending colon, diagnosed in October 2016, treated with surgical resection. Seven of eighteen lymph nodes were involved (7/18). Primary tumor measured 2.2 x 1.7 x 1.5 cm, with three apparent vascular metastases including a nodule up to 3 cm with metastatic tumor present within 1 mm of the inked radial margin. In the distal appendix there was also a separate 3 mm carcinoid tumor. This was a staged as a pT3pN1 with low mitotic rate (0 per 10 high powered fields).   [name] was sporadic.  We began seeing her in February 2022 for carcinoid syndrome symptoms.  Her chromogranin A was elevated at 262.  CT chest, abdomen and pelvis did not reveal any evidence of metastatic disease.  She was started on octreotide every 4 weeks.  MRI lumbar spine in March revealed progressive disc space narrowing at L5-S1with unchanged mild bilateral neural foraminal stenosis; unchanged mild lateral recess and neural foraminal stenosis at L3-4 and L4-5.  Chromogranin A remains normal in April.  She has had multiple hospitalizations in the past couple of months.  She was admitted with sepsis in May.  She has had difficulty with constipation, and in June, she underwent enemas and disimpaction in the ER.  She had been on oral iron supplement, but this was discontinued.  She was admitted in June for osteomyelitis and continues on IV antibiotics at home.  She has a PICC in place.  Her hemoglobin is normal today, so she will remain off oral iron.  She will proceed with octreotide this week and return to clinic in 4 weeks with a CBC and comprehensive metabolic panel prior to her next octreotide.

## 2021-11-28 ENCOUNTER — Encounter: Payer: Self-pay | Admitting: Hematology and Oncology

## 2021-11-28 ENCOUNTER — Inpatient Hospital Stay: Payer: Medicare HMO | Attending: Oncology | Admitting: Hematology and Oncology

## 2021-11-28 ENCOUNTER — Inpatient Hospital Stay: Payer: Medicare HMO

## 2021-11-28 VITALS — BP 128/77 | HR 63 | Temp 98.3°F | Resp 18 | Ht 64.0 in | Wt 159.0 lb

## 2021-11-28 DIAGNOSIS — C7A012 Malignant carcinoid tumor of the ileum: Secondary | ICD-10-CM

## 2021-11-28 DIAGNOSIS — Z79899 Other long term (current) drug therapy: Secondary | ICD-10-CM | POA: Diagnosis not present

## 2021-11-28 DIAGNOSIS — J9601 Acute respiratory failure with hypoxia: Secondary | ICD-10-CM | POA: Insufficient documentation

## 2021-11-28 DIAGNOSIS — Z7901 Long term (current) use of anticoagulants: Secondary | ICD-10-CM | POA: Insufficient documentation

## 2021-11-28 DIAGNOSIS — I2693 Single subsegmental pulmonary embolism without acute cor pulmonale: Secondary | ICD-10-CM | POA: Diagnosis not present

## 2021-11-28 DIAGNOSIS — G9589 Other specified diseases of spinal cord: Secondary | ICD-10-CM

## 2021-11-28 DIAGNOSIS — Z86711 Personal history of pulmonary embolism: Secondary | ICD-10-CM | POA: Diagnosis not present

## 2021-11-28 HISTORY — DX: Other specified diseases of spinal cord: G95.89

## 2021-11-28 LAB — PROTEIN, TOTAL: Total Protein: 8.5 g/dL — AB (ref 6.3–8.2)

## 2021-11-28 LAB — HEPATIC FUNCTION PANEL
ALT: 17 U/L (ref 7–35)
AST: 24 (ref 13–35)
Alkaline Phosphatase: 77 (ref 25–125)
Bilirubin, Total: 0.3

## 2021-11-28 LAB — BASIC METABOLIC PANEL
BUN: 16 (ref 4–21)
CO2: 32 — AB (ref 13–22)
Chloride: 102 (ref 99–108)
Creatinine: 0.7 (ref 0.5–1.1)
Glucose: 109
Potassium: 3.8 mEq/L (ref 3.5–5.1)
Sodium: 138 (ref 137–147)

## 2021-11-28 LAB — CBC AND DIFFERENTIAL
HCT: 40 (ref 36–46)
Hemoglobin: 13.2 (ref 12.0–16.0)
Neutrophils Absolute: 5.16
Platelets: 247 10*3/uL (ref 150–400)
WBC: 8.9

## 2021-11-28 LAB — CBC: RBC: 4.25 (ref 3.87–5.11)

## 2021-11-28 LAB — COMPREHENSIVE METABOLIC PANEL
Albumin: 4.3 (ref 3.5–5.0)
Calcium: 9.9 (ref 8.7–10.7)

## 2021-11-28 MED ORDER — OCTREOTIDE ACETATE 20 MG IM KIT
20.0000 mg | PACK | Freq: Once | INTRAMUSCULAR | Status: AC
  Administered 2021-11-28: 20 mg via INTRAMUSCULAR
  Filled 2021-11-28: qty 1

## 2021-11-28 MED ORDER — ALTEPLASE 2 MG IJ SOLR: 2.0000 mg | Freq: Once | INTRAMUSCULAR | Status: DC | PRN

## 2021-11-28 NOTE — Patient Instructions (Signed)
Octreotide Injection Solution What is this medication? OCTREOTIDE (ok TREE oh tide) treats high levels of growth hormone (acromegaly). It works by reducing the amount of growth hormone your body makes. This reduces symptoms and the risk of health problems caused by too much growth hormone, such as diabetes and heart disease. It may also be used to treat diarrhea caused by neuroendocrine tumors. It works by slowing down the release of serotonin from the tumor cells. This reduces the number of bowel movements you have. This medicine may be used for other purposes; ask your health care provider or pharmacist if you have questions. COMMON BRAND NAME(S): Bynfezia, Sandostatin What should I tell my care team before I take this medication? They need to know if you have any of these conditions: Diabetes Gallbladder disease Kidney disease Liver disease Thyroid disease An unusual or allergic reaction to octreotide, other medications, foods, dyes, or preservatives Pregnant or trying to get pregnant Breast-feeding How should I use this medication? This medication is injected under the skin or into a vein. It is usually given by your care team in a hospital or clinic setting. If you get this medication at home, you will be taught how to prepare and give it. Use exactly as directed. Take it as directed on the prescription label at the same time every day. Keep taking it unless your care team tells you to stop. Allow the injection solution to come to room temperature before use. Do not warm it artificially. It is important that you put your used needles and syringes in a special sharps container. Do not put them in a trash can. If you do not have a sharps container, call your pharmacist or care team to get one. Talk to your care team about the use of this medication in children. Special care may be needed. Overdosage: If you think you have taken too much of this medicine contact a poison control center or  emergency room at once. NOTE: This medicine is only for you. Do not share this medicine with others. What if I miss a dose? If you miss a dose, take it as soon as you can. If it is almost time for your next dose, take only that dose. Do not take double or extra doses. What may interact with this medication? Bromocriptine Certain medications for blood pressure, heart disease, irregular heartbeat Cyclosporine Diuretics Medications for diabetes, including insulin Quinidine This list may not describe all possible interactions. Give your health care provider a list of all the medicines, herbs, non-prescription drugs, or dietary supplements you use. Also tell them if you smoke, drink alcohol, or use illegal drugs. Some items may interact with your medicine. What should I watch for while using this medication? Visit your care team for regular checks on your progress. Tell your care team if your symptoms do not start to get better or if they get worse. To help reduce irritation at the injection site, use a different site for each injection and make sure the solution is at room temperature before use. This medication may cause decreases in blood sugar. Signs of low blood sugar include chills, cool, pale skin or cold sweats, drowsiness, extreme hunger, fast heartbeat, headache, nausea, nervousness or anxiety, shakiness, trembling, unsteadiness, tiredness, or weakness. Contact your care team right away if you experience any of these symptoms. This medication may increase blood sugar. The risk may be higher in patients who already have diabetes. Ask your care team what you can do to lower your   risk of diabetes while taking this medication. You should make sure you get enough vitamin B12 while you are taking this medication. Discuss the foods you eat and the vitamins you take with your care team. What side effects may I notice from receiving this medication? Side effects that you should report to your care  team as soon as possible: Allergic reactions--skin rash, itching, hives, swelling of the face, lips, tongue, or throat Gallbladder problems--severe stomach pain, nausea, vomiting, fever Heart rhythm changes--fast or irregular heartbeat, dizziness, feeling faint or lightheaded, chest pain, trouble breathing High blood sugar (hyperglycemia)--increased thirst or amount of urine, unusual weakness or fatigue, blurry vision Low blood sugar (hypoglycemia)--tremors or shaking, anxiety, sweating, cold or clammy skin, confusion, dizziness, rapid heartbeat Low thyroid levels (hypothyroidism)--unusual weakness or fatigue, increased sensitivity to cold, constipation, hair loss, dry skin, weight gain, feelings of depression Low vitamin B12 level--pain, tingling, or numbness in the hands or feet, muscle weakness, dizziness, confusion, trouble concentrating Pancreatitis--severe stomach pain that spreads to your back or gets worse after eating or when touched, fever, nausea, vomiting Side effects that usually do not require medical attention (report to your care team if they continue or are bothersome): Diarrhea Dizziness Gas Headache Pain, redness, or irritation at injection site Stomach pain This list may not describe all possible side effects. Call your doctor for medical advice about side effects. You may report side effects to FDA at 1-800-FDA-1088. Where should I keep my medication? Keep out of the reach of children and pets. Store in the refrigerator. Protect from light. Allow to come to room temperature naturally. Do not use artificial heat. If protected from light, the injection may be stored between 20 and 30 degrees C (70 and 86 degrees F) for 14 days. After the initial use, throw away any unused portion of a multiple dose vial after 14 days. Get rid of any unused portions of the ampules after use. To get rid of medications that are no longer needed or have expired: Take the medication to a medication  take-back program. Ask your pharmacy or law enforcement to find a location. If you cannot return the medication, ask your pharmacist or care team how to get rid of the medication safely. NOTE: This sheet is a summary. It may not cover all possible information. If you have questions about this medicine, talk to your doctor, pharmacist, or health care provider.  2023 [employer]vier/Gold Standard (2007-06-05 00:00:00)  

## 2021-11-28 NOTE — Assessment & Plan Note (Signed)
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  She has had previous surgery in her neck.  This is not a primary neurologic problem, so we are managing her symptoms.  She continues to have persistent symptoms despite gabapentin, so is using oxycodone 5 mg as needed.

## 2021-11-28 NOTE — Assessment & Plan Note (Signed)
She was found to have right lower lobe subsegmental nonocclusive pulmonary embolism incidentally in May, for which she was placed on Eliquis twice daily.  She states she saw Dr. Andria Frames yesterday and he recommended continuing Eliquis for at least 6 months.

## 2021-12-02 NOTE — Telephone Encounter (Signed)
Kristi Romero, Pt's aid is requesting call back to go over instructions again. She states she was driving the last time she talk to someone and would like a call back.

## 2021-12-03 ENCOUNTER — Ambulatory Visit (INDEPENDENT_AMBULATORY_CARE_PROVIDER_SITE_OTHER): Payer: Medicare HMO

## 2021-12-03 ENCOUNTER — Telehealth: Payer: Self-pay

## 2021-12-03 DIAGNOSIS — R079 Chest pain, unspecified: Secondary | ICD-10-CM

## 2021-12-03 LAB — MYOCARDIAL PERFUSION IMAGING
LV dias vol: 58 mL (ref 46–106)
LV sys vol: 23 mL
Nuc Stress EF: 61 %
Peak HR: 96 {beats}/min
Rest HR: 53 {beats}/min
Rest Nuclear Isotope Dose: 10.4 mCi
SDS: 0
SRS: 4
SSS: 4
ST Depression (mm): 0 mm
Stress Nuclear Isotope Dose: 31 mCi
TID: 0.62

## 2021-12-03 LAB — ECHOCARDIOGRAM COMPLETE
Area-P 1/2: 5.13 cm2
Height: 64 in
S' Lateral: 2.6 cm
Weight: 2464 oz

## 2021-12-03 MED ORDER — REGADENOSON 0.4 MG/5ML IV SOLN
0.4000 mg | Freq: Once | INTRAVENOUS | Status: AC
  Administered 2021-12-03: 0.4 mg via INTRAVENOUS

## 2021-12-03 MED ORDER — TECHNETIUM TC 99M TETROFOSMIN IV KIT
10.4000 | PACK | Freq: Once | INTRAVENOUS | Status: AC | PRN
  Administered 2021-12-03: 10.4 via INTRAVENOUS

## 2021-12-03 MED ORDER — TECHNETIUM TC 99M TETROFOSMIN IV KIT
31.0000 | PACK | Freq: Once | INTRAVENOUS | Status: AC | PRN
  Administered 2021-12-03: 31 via INTRAVENOUS

## 2021-12-03 NOTE — Telephone Encounter (Signed)
Spoke with Butch Penny (on DPR), notified of results, Results faxed to PCP

## 2021-12-03 NOTE — Telephone Encounter (Signed)
-----   Message from Jenean Lindau, MD sent at 12/03/2021 12:46 PM EDT ----- The results of the study is unremarkable. Please inform patient. I will discuss in detail at next appointment. Cc  primary care/referring physician Jenean Lindau, MD 12/03/2021 12:46 PM

## 2021-12-24 ENCOUNTER — Other Ambulatory Visit: Payer: Self-pay

## 2021-12-26 ENCOUNTER — Encounter: Payer: Self-pay | Admitting: Hematology and Oncology

## 2021-12-26 ENCOUNTER — Inpatient Hospital Stay: Payer: Medicare HMO

## 2021-12-26 ENCOUNTER — Inpatient Hospital Stay (INDEPENDENT_AMBULATORY_CARE_PROVIDER_SITE_OTHER): Payer: Medicare HMO | Admitting: Hematology and Oncology

## 2021-12-26 VITALS — BP 95/75 | HR 93 | Temp 98.6°F | Resp 18

## 2021-12-26 DIAGNOSIS — C7A012 Malignant carcinoid tumor of the ileum: Secondary | ICD-10-CM

## 2021-12-26 DIAGNOSIS — I2699 Other pulmonary embolism without acute cor pulmonale: Secondary | ICD-10-CM

## 2021-12-26 DIAGNOSIS — G9589 Other specified diseases of spinal cord: Secondary | ICD-10-CM | POA: Diagnosis not present

## 2021-12-26 LAB — CBC AND DIFFERENTIAL
HCT: 37 (ref 36–46)
Hemoglobin: 12.5 (ref 12.0–16.0)
Neutrophils Absolute: 2.66
Platelets: 224 10*3/uL (ref 150–400)
WBC: 7.4

## 2021-12-26 LAB — HEPATIC FUNCTION PANEL
ALT: 15 U/L (ref 7–35)
AST: 21 (ref 13–35)
Alkaline Phosphatase: 74 (ref 25–125)
Bilirubin, Total: 0.3

## 2021-12-26 LAB — BASIC METABOLIC PANEL
BUN: 13 (ref 4–21)
CO2: 31 — AB (ref 13–22)
Chloride: 105 (ref 99–108)
Creatinine: 0.7 (ref 0.5–1.1)
Glucose: 147
Potassium: 4 mEq/L (ref 3.5–5.1)
Sodium: 140 (ref 137–147)

## 2021-12-26 LAB — CBC: RBC: 3.95 (ref 3.87–5.11)

## 2021-12-26 LAB — COMPREHENSIVE METABOLIC PANEL
Albumin: 4.2 (ref 3.5–5.0)
Calcium: 9.8 (ref 8.7–10.7)

## 2021-12-26 MED ORDER — OCTREOTIDE ACETATE 20 MG IM KIT
20.0000 mg | PACK | Freq: Once | INTRAMUSCULAR | Status: AC
  Administered 2021-12-26: 20 mg via INTRAMUSCULAR

## 2021-12-26 NOTE — Assessment & Plan Note (Addendum)
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  She has had previous surgery in her neck.  This is not a primary neurologic problem, so we are managing her symptoms.  She continues to have persistent symptoms despite gabapentin, so had been using oxycodone 5 mg as needed, but states another provider increased this to 7.5 mg i.e. 1-1/2 tablets every 6 hours as needed.  She is also on gabapentin and Cymbalta.  She states she is using oxycodone about twice daily. 

## 2021-12-26 NOTE — Assessment & Plan Note (Signed)
She was found to have right lower lobe subsegmental nonocclusive pulmonary embolism incidentally in May, for which she was placed on Eliquis twice daily.  Dr. Alcide Clever recommended continuing Eliquis for at least 6 months.

## 2021-12-26 NOTE — Progress Notes (Signed)
[place]  [address] 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  Seven of eighteen lymph nodes were involved (7/18).  Primary tumor measured 2.2 x 1.7 x 1.5 cm, with three apparent vascular metastases including a nodule up to 3 cm with metastatic tumor present within 1 mm of the inked radial margin.  In the distal appendix there was also a separate 3 mm carcinoid tumor.  This was a staged as a pT3pN1 with low mitotic rate (0 per 10 high powered fields).    [name] was sporadic.  We began seeing her in February 2022 for carcinoid syndrome symptoms.  Her chromogranin A was elevated at 262.  CT chest, abdomen and pelvis did not reveal any evidence of metastatic disease.  She was started on octreotide every 4 weeks.  She had normalization of her chromogranin A.  CT chest, abdomen and pelvis on August 29 done by Dr. Melina Copa did not reveal any evidence of recurrence or metastatic disease.  Chromogranin A remained normal in July.  We will continue octreotide every 4 weeks and plan to see her back in 12 weeks with a CBC, comprehensive metabolic panel and chromogranin A.  Pulmonary embolism with acute respiratory failure with hypoxia  She was found to have right lower lobe subsegmental nonocclusive pulmonary embolism incidentally in May, for which she was placed on Eliquis twice daily.  Dr. Alcide Clever recommended continuing Eliquis for at least 6 months.  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  She has had previous surgery in her neck.  This is not a primary neurologic problem, so we are managing her symptoms.  She continues to have persistent symptoms  despite gabapentin, so had been using oxycodone 5 mg as needed, but states another doctor increased this to 7.5 mg i.e. 1-1/2 tablets every 6 hours as needed.  She is also on gabapentin and Cymbalta.  She states she is using oxycodone about twice daily.   The patient understands the plans discussed today and is in agreement with them.  She knows to contact our office if she develops concerns prior to her next appointment.   I provided 20 minutes of face-to-face time during this encounter and > 50% was spent counseling as documented under my assessment and plan.    [name], PA-C  [facility] [address] [place] [address] Dept: [phone] Dept Fax: [phone]   Orders Placed This Encounter  Procedures   CBC and differential    This external order was created through the Results Console.   CBC    This external order was created through the Results Console.   Basic metabolic panel    This external order was created through the Results Console.   Comprehensive metabolic panel    This external order was created through the Results Console.   Hepatic function panel    This external order was created through the Results Console.      CHIEF COMPLAINT:  CC: Malignant carcinoid  Current Treatment: Monthly octreotide  HISTORY OF PRESENT ILLNESS:   Oncology History  Malignant carcinoid tumor of ileum ([place])  04/28/2014 Initial Diagnosis   Malignant carcinoid  tumor of ileum ([place])   02/02/2015 Cancer Staging   Staging form: Neuroendocrine Tumor - Duodenum/Ampulla/Jejunum/Illeum, AJCC 7th Edition - Clinical stage from 02/02/2015: Stage IIIB (T3(3), N1, M0) - Signed by Derwood Kaplan, MD on 05/28/2020 Staged by: Managing physician Diagnostic confirmation: Positive histology Specimen type: Excision Histopathologic type: Carcinoid tumor, NOS Stage prefix: Initial diagnosis Tumor size (mm): 22 Multiple  tumors: Yes Number of tumors: 3 Histologic grade (G): G1 Lymph-vascular invasion (LVI): LVI not present (absent)/not identified Residual tumor (R): R0 - None Stage used in treatment planning: Yes National guidelines used in treatment planning: Yes Type of national guideline used in treatment planning: NCCN Staging comments: Surgical resection       INTERVAL HISTORY:  Kristi Romero is here today for repeat clinical assessment prior to octreotide and states she had a CT scan earlier this week ordered by Dr. Melina Copa.  She is to see him for follow-up later this month..  Her main complaint is neuropathy of her legs.  Once again advised her that we feel this is coming from her spine and has nothing to do with her treatment for her disease.  She is scheduled for a second opinion with neurology at [facility] but not until October.  She has been using oxycodone 5 mg every 6 hours as needed, but states another provider advised her to use 1-1/2 tablets.  She is still usually only taking this twice daily.  She continues gabapentin and Cymbalta.  She has occasional flushing, but denies diarrhea.  She reports fatigue. She denies fevers or chills.  Her appetite is good and she is using Ensure. Her weight has increased 11 pounds over last month .  REVIEW OF SYSTEMS:  Review of Systems  Constitutional:  Positive for fatigue. Negative for appetite change, chills, fever and unexpected weight change.  HENT:   Negative for lump/mass, mouth sores and sore throat.   Respiratory:  Negative for cough and shortness of breath.   Cardiovascular:  Negative for chest pain and leg swelling.  Gastrointestinal:  Negative for abdominal pain, constipation, diarrhea, nausea and vomiting.  Genitourinary:  Negative for difficulty urinating, dysuria, frequency and hematuria.   Musculoskeletal:  Negative for arthralgias, back pain and myalgias.  Skin:  Negative for rash.  Neurological:  Negative for dizziness and headaches.  Hematological:   Negative for adenopathy. Does not bruise/bleed easily.  Psychiatric/Behavioral:  Negative for depression and sleep disturbance. The patient is not nervous/anxious.      VITALS:  Blood pressure 134/72, pulse 66, temperature 98.4 F (36.9 C), temperature source Oral, resp. rate 18, height '5\' 4"'$  (1.626 m), weight 170 lb 14.4 oz (77.5 kg), SpO2 92 %.  Wt Readings from Last 3 Encounters:  12/26/21 170 lb 14.4 oz (77.5 kg)  12/03/21 154 lb (69.9 kg)  11/28/21 159 lb (72.1 kg)    Body mass index is 29.33 kg/m.  Performance status (ECOG): 2 - Symptomatic, <50% confined to bed  PHYSICAL EXAM:  Physical Exam Vitals and nursing note reviewed.  Constitutional:      General: She is not in acute distress.    Appearance: Normal appearance.  HENT:     Head: Normocephalic and atraumatic.     Mouth/Throat:     Mouth: Mucous membranes are moist.     Pharynx: Oropharynx is clear. No oropharyngeal exudate or posterior oropharyngeal erythema.  Eyes:     General: No scleral icterus.    Extraocular Movements: Extraocular movements intact.     Conjunctiva/sclera: Conjunctivae normal.  Pupils: Pupils are equal, round, and reactive to light.  Cardiovascular:     Rate and Rhythm: Normal rate and regular rhythm.     Heart sounds: Normal heart sounds. No murmur heard.    No friction rub. No gallop.  Pulmonary:     Effort: Pulmonary effort is normal.     Breath sounds: Normal breath sounds. No wheezing, rhonchi or rales.  Abdominal:     General: There is no distension.     Palpations: Abdomen is soft. There is no hepatomegaly, splenomegaly or mass.     Tenderness: There is no abdominal tenderness.  Musculoskeletal:        General: Normal range of motion.     Cervical back: Normal range of motion and neck supple. No tenderness.     Right lower leg: No edema.     Left lower leg: No edema.  Lymphadenopathy:     Cervical: No cervical adenopathy.     Upper Body:     Right upper body: No  supraclavicular or axillary adenopathy.     Left upper body: No supraclavicular or axillary adenopathy.  Skin:    General: Skin is warm and dry.     Coloration: Skin is not jaundiced.     Findings: No rash.  Neurological:     Mental Status: She is alert and oriented to person, place, and time.     Cranial Nerves: No cranial nerve deficit.  Psychiatric:        Mood and Affect: Mood normal.        Behavior: Behavior normal.        Thought Content: Thought content normal.     LABS:      Latest Ref Rng & Units 12/26/2021   12:00 AM 11/28/2021   12:00 AM 10/31/2021   12:00 AM  CBC  WBC  7.4     8.9     6.0      Hemoglobin 12.0 - 16.0 12.5     13.2     12.0      Hematocrit 36 - 46 37     40     36      Platelets 150 - 400 K/uL 224     247     234         This result is from an external source.      Latest Ref Rng & Units 12/26/2021   12:00 AM 11/28/2021   12:00 AM 10/31/2021   12:00 AM  CMP  BUN 4 - '21 13     16     7      '$ Creatinine 0.5 - 1.1 0.7     0.7     0.6      Sodium 137 - 147 140     138     140      Potassium 3.5 - 5.1 mEq/L 4.0     3.8     3.3      Chloride 99 - 108 105     102     104      CO2 13 - 22 31     32     30      Calcium 8.7 - 10.7 9.8     9.9     9.6      Total Protein 6.3 - 8.2 g/dL  8.5       Alkaline Phos 25 - 125 74     77  113      AST 13 - 35 21     24     63      ALT 7 - 35 U/L 15     17     76         This result is from an external source.     No results found for: "CEA1", "CEA" / No results found for: "CEA1", "CEA" No results found for: "PSA1" No results found for: "RXV400" No results found for: "CAN125"  Lab Results  Component Value Date   TOTALPROTELP 6.6 06/01/2020   ALBUMINELP 3.1 06/01/2020   A1GS 0.3 06/01/2020   A2GS 0.8 06/01/2020   BETS 1.1 06/01/2020   GAMS 1.4 06/01/2020   MSPIKE Not Observed 06/01/2020   SPEI Comment 06/01/2020   Lab Results  Component Value Date   TIBC 244 (L) 09/06/2021   FERRITIN 873 (H)  09/06/2021   IRONPCTSAT 9 (L) 09/06/2021   No results found for: "LDH"  STUDIES:  MYOCARDIAL PERFUSION IMAGING  Result Date: 12/03/2021   Limited study secondary to GI artefact but grossly no ischemia and no prior myocardial infarction. The study is low risk.   No ST deviation was noted.   Left ventricular function is normal. Nuclear stress EF: 61 %. The left ventricular ejection fraction is normal (55-65%). End diastolic cavity size is normal.   Prior study not available for comparison.   ECHOCARDIOGRAM COMPLETE  Result Date: 12/03/2021    ECHOCARDIOGRAM REPORT   Patient Name:   Kristi Romero Date of Exam: 12/03/2021 Medical Rec #:  [id number]     Height:       64.0 in Accession #:    [id number]    Weight:       159.0 lb Date of Birth:  1955/05/02      BSA:          1.775 m Patient Age:    66 years      BP:           128/77 mmHg Patient Gender: F             HR:           60 bpm. Exam Location:  [facility] Procedure: 2D Echo, Cardiac Doppler, Color Doppler, Strain Analysis and 3D Echo Indications:    Chest pain, unspecified type [R07.9 (ICD-10-CM)]  History:        Patient has prior history of Echocardiogram examinations, most                 recent 08/29/2021. Signs/Symptoms:Chest Pain; Risk                 Factors:Hypertension and Dyslipidemia.  Sonographer:    Philipp Deputy RDCS Referring Phys: [place]  1. Sigmoid septum without obstruction. GLS -19.9. Left ventricular ejection fraction, by estimation, is 60 to 65%. The left ventricle has normal function. The left ventricle has no regional wall motion abnormalities. There is mild left ventricular hypertrophy. Left ventricular diastolic parameters are consistent with Grade II diastolic dysfunction (pseudonormalization).  2. Right ventricular systolic function is normal. The right ventricular size is normal. There is normal pulmonary artery systolic pressure.  3. The mitral valve is normal in structure. No evidence of mitral valve  regurgitation. No evidence of mitral stenosis.  4. The aortic valve is normal in structure. Aortic valve regurgitation is trivial. Aortic valve sclerosis is present, with no evidence of aortic valve stenosis.  5. The  inferior vena cava is normal in size with greater than 50% respiratory variability, suggesting right atrial pressure of 3 mmHg. FINDINGS  Left Ventricle: Sigmoid septum without obstruction. GLS -19.9. Left ventricular ejection fraction, by estimation, is 60 to 65%. The left ventricle has normal function. The left ventricle has no regional wall motion abnormalities. The left ventricular internal cavity size was normal in size. There is mild left ventricular hypertrophy. Left ventricular diastolic parameters are consistent with Grade II diastolic dysfunction (pseudonormalization). Right Ventricle: The right ventricular size is normal. No increase in right ventricular wall thickness. Right ventricular systolic function is normal. There is normal pulmonary artery systolic pressure. The tricuspid regurgitant velocity is 2.29 m/s, and  with an assumed right atrial pressure of 3 mmHg, the estimated right ventricular systolic pressure is 14.7 mmHg. Left Atrium: Left atrial size was normal in size. Right Atrium: Right atrial size was normal in size. Pericardium: There is no evidence of pericardial effusion. Mitral Valve: The mitral valve is normal in structure. No evidence of mitral valve regurgitation. No evidence of mitral valve stenosis. Tricuspid Valve: The tricuspid valve is normal in structure. Tricuspid valve regurgitation is mild . No evidence of tricuspid stenosis. Aortic Valve: The aortic valve is normal in structure. Aortic valve regurgitation is trivial. Aortic valve sclerosis is present, with no evidence of aortic valve stenosis. Pulmonic Valve: The pulmonic valve was normal in structure. Pulmonic valve regurgitation is mild. No evidence of pulmonic stenosis. Aorta: The aortic root is normal in size  and structure. Venous: The inferior vena cava is normal in size with greater than 50% respiratory variability, suggesting right atrial pressure of 3 mmHg. IAS/Shunts: No atrial level shunt detected by color flow Doppler.  LEFT VENTRICLE PLAX 2D LVIDd:         3.70 cm   Diastology LVIDs:         2.60 cm   LV e' medial:    6.52 cm/s LV PW:         1.40 cm   LV E/e' medial:  14.8 LV IVS:        1.30 cm   LV e' lateral:   8.27 cm/s LVOT diam:     1.90 cm   LV E/e' lateral: 11.7 LV SV:         72 LV SV Index:   40 LVOT Area:     2.84 cm                           3D Volume EF:                          3D EF:        74 %                          LV EDV:       86 ml                          LV ESV:       22 ml                          LV SV:        64 ml RIGHT VENTRICLE          IVC RV Basal diam:  2.60 cm  IVC diam: 1.70 cm TAPSE (M-mode): 2.6 cm LEFT ATRIUM             Index        RIGHT ATRIUM           Index LA diam:        3.00 cm 1.69 cm/m   RA Area:     12.80 cm LA Vol (A2C):   49.8 ml 28.06 ml/m  RA Volume:   27.10 ml  15.27 ml/m LA Vol (A4C):   35.7 ml 20.12 ml/m LA Biplane Vol: 42.0 ml 23.67 ml/m  AORTIC VALVE             PULMONIC VALVE LVOT Vmax:   104.00 cm/s PR End Diast Vel: 4.84 msec LVOT Vmean:  71.200 cm/s LVOT VTI:    0.253 m  AORTA Ao Root diam: 3.50 cm Ao Asc diam:  3.30 cm Ao Desc diam: 2.20 cm MITRAL VALVE                TRICUSPID VALVE MV Area (PHT): 5.13 cm     TR Peak grad:   21.0 mmHg MV Decel Time: 148 msec     TR Vmax:        229.00 cm/s MV E velocity: 96.70 cm/s MV A velocity: 103.00 cm/s  SHUNTS MV E/A ratio:  0.94         Systemic VTI:  0.25 m                             Systemic Diam: 1.90 cm Jenne Campus MD Electronically signed by Jenne Campus MD Signature Date/Time: 12/03/2021/10:56:13 AM    Final       Exam(s): [id number] CT/CT ABD-PELV W/IV CM  CLINICAL DATA: Abdominal pain for 1 week. Nephrolithiasis.  Abdominal carcinoid tumor.  * Tracking Code: BO *   EXAM:  CT  ABDOMEN AND PELVIS WITH CONTRAST   TECHNIQUE:  Multidetector CT imaging of the abdomen and pelvis was performed  using the standard protocol following bolus administration of  intravenous contrast.   RADIATION DOSE REDUCTION: This exam was performed according to the  departmental dose-optimization program which includes automated  exposure control, adjustment of the mA and/or kV according to  patient size and/or use of iterative reconstruction technique.   CONTRAST: 100 mL Isovue 370   COMPARISON: 10/13/2021   FINDINGS:  Lower Chest: No acute findings.  Hepatobiliary: No hepatic masses identified. Prior cholecystectomy.  No evidence of biliary obstruction.  Pancreas: No mass or inflammatory changes.  Spleen: Within normal limits in size and appearance.  Adrenals/Urinary Tract: Stable 2 cm left adrenal mass, consistent  with benign adenoma (no followup imaging is recommended). No renal  masses identified. No evidence of ureteral calculi or  hydronephrosis. Unremarkable unopacified urinary bladder.  Stomach/Bowel: No evidence of obstruction, inflammatory process or  abnormal fluid collections.  Vascular/Lymphatic: No pathologically enlarged lymph nodes. No acute  vascular findings. Aortic atherosclerotic calcification incidentally  noted. 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 No  evidence of abdominal aortic aneurysm.  Reproductive: Prior hysterectomy noted. Adnexal regions are  unremarkable in appearance.  Other: None.  Musculoskeletal: No suspicious bone lesions identified. Chronic  discitis at T10-11 is stable in appearance.   IMPRESSION:  No acute findings. No evidence of recurrent carcinoid tumor or  metastatic disease.  Stable small benign left adrenal adenoma.  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  Stable  appearance of chronic discitis at T10-11.    HISTORY:   Past Medical History:  Diagnosis Date   Abdominal pain, epigastric    Accident  occurring in home 05/07/2016   Acute respiratory failure with hypoxia ([place]) 09/06/2021   Adrenal adenoma 03/16/2018   Anxiety and depression 03/16/2018   BMI 26.0-26.9,adult    Cervicogenic headache 10/14/2016   Chest pain, unspecified    Chronic abdominal pain and nausea  09/06/2021   Chronic back pain    Constipation 04/08/2021   Dizziness 10/14/2016   Elevated lipase 01/13/2017   Formatting of this note might be different from the original. 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   Normocytic anemia 09/06/2021   Numbness    Numbness and tingling 10/14/2016   Pulmonary embolism with acute respiratory failure with hypoxia  09/06/2021   Restless leg syndrome    Sleeping difficulty 04/18/2016   Spondylosis, cervical, 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 Dr Harl Favor Meigs    COLONOSCOPY  09/05/2020   Dr Orlena Sheldon Benign neoplasm of transverse colon. Benign neoplasm of descending colon.   ESOPHAGOGASTRODUODENOSCOPY  02/2018   ESOPHAGOGASTRODUODENOSCOPY (EGD) WITH PROPOFOL N/A 03/16/2018   Procedure:  ESOPHAGOGASTRODUODENOSCOPY (EGD) WITH PROPOFOL;  Surgeon: Ladene Artist, MD;  Location: [facility];  Service: Endoscopy;  Laterality: N/A;   NECK SURGERY     PARTIAL HYSTERECTOMY     TOE SURGERY Bilateral     Family History  Problem Relation Age of Onset   Lung cancer Father    Heart disease Father    Hypertension Father    Heart disease Mother    Hypertension Mother    Cancer Mother     Social History:  reports that she has quit smoking. She has never used smokeless tobacco. She reports current drug use. Drug: Marijuana. She reports that she does not drink alcohol.The patient is accompanied by her caregiver, Butch Penny, today.  Allergies:  Allergies  Allergen Reactions   Atorvastatin Anaphylaxis   Codeine Nausea Only, Nausea And Vomiting and Shortness Of Breath    Other reaction(s): GI Upset (intolerance)   Zestril [Lisinopril] Anaphylaxis and Swelling    Current Medications: Current Outpatient Medications  Medication Sig Dispense Refill   acetaminophen (TYLENOL) 500 MG tablet Take 500 mg by mouth every 6 (six) hours as needed for mild pain.     apixaban (ELIQUIS) 5 MG TABS tablet Take 1 tablet (5 mg total) by mouth 2 (two) times daily. 60 tablet 0   baclofen (LIORESAL) 10 MG tablet Take 10 mg by mouth 2 (two) times daily.     dicyclomine (  BENTYL) 20 MG tablet Take 20 mg by mouth 4 (four) times daily as needed for spasms.     DULoxetine (CYMBALTA) 60 MG capsule Take 60 mg by mouth daily.     gabapentin (NEURONTIN) 300 MG capsule Take 300 mg by mouth at bedtime.     LINZESS 290 MCG CAPS capsule Take 290 mcg by mouth daily.     meclizine (ANTIVERT) 25 MG tablet Take 25 mg by mouth 4 (four) times daily as needed for dizziness.     mirtazapine (REMERON) 15 MG tablet Take 15 mg by mouth at bedtime.     morphine (MSIR) 15 MG tablet Take 15 mg by mouth as needed for severe pain.     ondansetron (ZOFRAN) 4 MG tablet Take 1 tablet (4 mg total) by mouth every 6 (six) hours. 12 tablet 0    ondansetron (ZOFRAN-ODT) 4 MG disintegrating tablet Take 4 mg by mouth 2 (two) times daily as needed for nausea or vomiting.     oxycodone (OXY-IR) 5 MG capsule Take 5 mg by mouth every 6 (six) hours as needed for pain.     pantoprazole (PROTONIX) 40 MG tablet Take 40 mg by mouth daily.     sucralfate (CARAFATE) 1 g tablet Take 1 g by mouth daily.     No current facility-administered medications for this visit.

## 2021-12-26 NOTE — Assessment & Plan Note (Signed)
History of neuroendocrine/carcinoid tumor of the ascending colon, diagnosed in October 2016, treated with surgical resection. Seven of eighteen lymph nodes were involved (7/18). Primary tumor measured 2.2 x 1.7 x 1.5 cm, with three apparent vascular metastases including a nodule up to 3 cm with metastatic tumor present within 1 mm of the inked radial margin. In the distal appendix there was also a separate 3 mm carcinoid tumor. This was a staged as a pT3pN1 with low mitotic rate (0 per 10 high powered fields).   [name] was sporadic.  We began seeing her in February 2022 for carcinoid syndrome symptoms.  Her chromogranin A was elevated at 262.  CT chest, abdomen and pelvis did not reveal any evidence of metastatic disease.  She was started on octreotide every 4 weeks.  She had normalization of her chromogranin A.  CT chest, abdomen and pelvis on August 29 done by Dr. Melina Copa did not reveal any evidence of recurrence or metastatic disease.  Chromogranin A remained normal in July.  We will continue octreotide every 4 weeks and plan to see her back in 12 weeks with a CBC, comprehensive metabolic panel and chromogranin A.

## 2021-12-26 NOTE — Patient Instructions (Signed)
Octreotide Injection Solution What is this medication? OCTREOTIDE (ok TREE oh tide) treats high levels of growth hormone (acromegaly). It works by reducing the amount of growth hormone your body makes. This reduces symptoms and the risk of health problems caused by too much growth hormone, such as diabetes and heart disease. It may also be used to treat diarrhea caused by neuroendocrine tumors. It works by slowing down the release of serotonin from the tumor cells. This reduces the number of bowel movements you have. This medicine may be used for other purposes; ask your health care provider or pharmacist if you have questions. COMMON BRAND NAME(S): Bynfezia, Sandostatin What should I tell my care team before I take this medication? They need to know if you have any of these conditions: Diabetes Gallbladder disease Kidney disease Liver disease Thyroid disease An unusual or allergic reaction to octreotide, other medications, foods, dyes, or preservatives Pregnant or trying to get pregnant Breast-feeding How should I use this medication? This medication is injected under the skin or into a vein. It is usually given by your care team in a hospital or clinic setting. If you get this medication at home, you will be taught how to prepare and give it. Use exactly as directed. Take it as directed on the prescription label at the same time every day. Keep taking it unless your care team tells you to stop. Allow the injection solution to come to room temperature before use. Do not warm it artificially. It is important that you put your used needles and syringes in a special sharps container. Do not put them in a trash can. If you do not have a sharps container, call your pharmacist or care team to get one. Talk to your care team about the use of this medication in children. Special care may be needed. Overdosage: If you think you have taken too much of this medicine contact a poison control center or  emergency room at once. NOTE: This medicine is only for you. Do not share this medicine with others. What if I miss a dose? If you miss a dose, take it as soon as you can. If it is almost time for your next dose, take only that dose. Do not take double or extra doses. What may interact with this medication? Bromocriptine Certain medications for blood pressure, heart disease, irregular heartbeat Cyclosporine Diuretics Medications for diabetes, including insulin Quinidine This list may not describe all possible interactions. Give your health care provider a list of all the medicines, herbs, non-prescription drugs, or dietary supplements you use. Also tell them if you smoke, drink alcohol, or use illegal drugs. Some items may interact with your medicine. What should I watch for while using this medication? Visit your care team for regular checks on your progress. Tell your care team if your symptoms do not start to get better or if they get worse. To help reduce irritation at the injection site, use a different site for each injection and make sure the solution is at room temperature before use. This medication may cause decreases in blood sugar. Signs of low blood sugar include chills, cool, pale skin or cold sweats, drowsiness, extreme hunger, fast heartbeat, headache, nausea, nervousness or anxiety, shakiness, trembling, unsteadiness, tiredness, or weakness. Contact your care team right away if you experience any of these symptoms. This medication may increase blood sugar. The risk may be higher in patients who already have diabetes. Ask your care team what you can do to lower your   risk of diabetes while taking this medication. You should make sure you get enough vitamin B12 while you are taking this medication. Discuss the foods you eat and the vitamins you take with your care team. What side effects may I notice from receiving this medication? Side effects that you should report to your care  team as soon as possible: Allergic reactions--skin rash, itching, hives, swelling of the face, lips, tongue, or throat Gallbladder problems--severe stomach pain, nausea, vomiting, fever Heart rhythm changes--fast or irregular heartbeat, dizziness, feeling faint or lightheaded, chest pain, trouble breathing High blood sugar (hyperglycemia)--increased thirst or amount of urine, unusual weakness or fatigue, blurry vision Low blood sugar (hypoglycemia)--tremors or shaking, anxiety, sweating, cold or clammy skin, confusion, dizziness, rapid heartbeat Low thyroid levels (hypothyroidism)--unusual weakness or fatigue, increased sensitivity to cold, constipation, hair loss, dry skin, weight gain, feelings of depression Low vitamin B12 level--pain, tingling, or numbness in the hands or feet, muscle weakness, dizziness, confusion, trouble concentrating Pancreatitis--severe stomach pain that spreads to your back or gets worse after eating or when touched, fever, nausea, vomiting Side effects that usually do not require medical attention (report to your care team if they continue or are bothersome): Diarrhea Dizziness Gas Headache Pain, redness, or irritation at injection site Stomach pain This list may not describe all possible side effects. Call your doctor for medical advice about side effects. You may report side effects to FDA at 1-800-FDA-1088. Where should I keep my medication? Keep out of the reach of children and pets. Store in the refrigerator. Protect from light. Allow to come to room temperature naturally. Do not use artificial heat. If protected from light, the injection may be stored between 20 and 30 degrees C (70 and 86 degrees F) for 14 days. After the initial use, throw away any unused portion of a multiple dose vial after 14 days. Get rid of any unused portions of the ampules after use. To get rid of medications that are no longer needed or have expired: Take the medication to a medication  take-back program. Ask your pharmacy or law enforcement to find a location. If you cannot return the medication, ask your pharmacist or care team how to get rid of the medication safely. NOTE: This sheet is a summary. It may not cover all possible information. If you have questions about this medicine, talk to your doctor, pharmacist, or health care provider.  2023 [employer]vier/Gold Standard (2007-06-05 00:00:00)  

## 2022-01-23 ENCOUNTER — Inpatient Hospital Stay: Payer: Medicare HMO

## 2022-01-23 ENCOUNTER — Other Ambulatory Visit: Payer: Self-pay | Admitting: Pharmacist

## 2022-01-23 ENCOUNTER — Inpatient Hospital Stay: Payer: Medicare HMO | Attending: Oncology

## 2022-01-23 ENCOUNTER — Ambulatory Visit: Payer: Medicare HMO

## 2022-01-23 VITALS — BP 108/63 | HR 73 | Temp 98.4°F | Resp 18 | Ht 64.0 in | Wt 171.0 lb

## 2022-01-23 DIAGNOSIS — C7A012 Malignant carcinoid tumor of the ileum: Secondary | ICD-10-CM | POA: Insufficient documentation

## 2022-01-23 DIAGNOSIS — Z79899 Other long term (current) drug therapy: Secondary | ICD-10-CM | POA: Insufficient documentation

## 2022-01-23 LAB — HEPATIC FUNCTION PANEL
ALT: 12 U/L (ref 7–35)
AST: 21 (ref 13–35)
Alkaline Phosphatase: 83 (ref 25–125)
Bilirubin, Total: 0.4

## 2022-01-23 LAB — CBC AND DIFFERENTIAL
HCT: 39 (ref 36–46)
Hemoglobin: 13.3 (ref 12.0–16.0)
Neutrophils Absolute: 2.95
Platelets: 235 10*3/uL (ref 150–400)
WBC: 8.2

## 2022-01-23 LAB — BASIC METABOLIC PANEL
BUN: 14 (ref 4–21)
CO2: 28 — AB (ref 13–22)
Chloride: 105 (ref 99–108)
Creatinine: 0.8 (ref 0.5–1.1)
Glucose: 92
Potassium: 3.8 mEq/L (ref 3.5–5.1)
Sodium: 136 — AB (ref 137–147)

## 2022-01-23 LAB — COMPREHENSIVE METABOLIC PANEL
Albumin: 4.3 (ref 3.5–5.0)
Calcium: 9.5 (ref 8.7–10.7)

## 2022-01-23 LAB — CBC: RBC: 4.15 (ref 3.87–5.11)

## 2022-01-23 MED ORDER — OCTREOTIDE ACETATE 20 MG IM KIT
20.0000 mg | PACK | Freq: Once | INTRAMUSCULAR | Status: AC
  Administered 2022-01-23: 20 mg via INTRAMUSCULAR

## 2022-01-23 MED ORDER — SODIUM CHLORIDE 0.9% FLUSH: 10.0000 mL | INTRAVENOUS | Status: DC | PRN

## 2022-01-23 MED ORDER — HEPARIN SOD (PORK) LOCK FLUSH 100 UNIT/ML IV SOLN: 500.0000 [IU] | Freq: Once | INTRAVENOUS | Status: DC | PRN

## 2022-01-23 NOTE — Patient Instructions (Signed)
Octreotide Injection Solution What is this medication? OCTREOTIDE (ok TREE oh tide) treats high levels of growth hormone (acromegaly). It works by reducing the amount of growth hormone your body makes. This reduces symptoms and the risk of health problems caused by too much growth hormone, such as diabetes and heart disease. It may also be used to treat diarrhea caused by neuroendocrine tumors. It works by slowing down the release of serotonin from the tumor cells. This reduces the number of bowel movements you have. This medicine may be used for other purposes; ask your health care provider or pharmacist if you have questions. COMMON BRAND NAME(S): Bynfezia, Sandostatin What should I tell my care team before I take this medication? They need to know if you have any of these conditions: Diabetes Gallbladder disease Kidney disease Liver disease Thyroid disease An unusual or allergic reaction to octreotide, other medications, foods, dyes, or preservatives Pregnant or trying to get pregnant Breast-feeding How should I use this medication? This medication is injected under the skin or into a vein. It is usually given by your care team in a hospital or clinic setting. If you get this medication at home, you will be taught how to prepare and give it. Use exactly as directed. Take it as directed on the prescription label at the same time every day. Keep taking it unless your care team tells you to stop. Allow the injection solution to come to room temperature before use. Do not warm it artificially. It is important that you put your used needles and syringes in a special sharps container. Do not put them in a trash can. If you do not have a sharps container, call your pharmacist or care team to get one. Talk to your care team about the use of this medication in children. Special care may be needed. Overdosage: If you think you have taken too much of this medicine contact a poison control center or  emergency room at once. NOTE: This medicine is only for you. Do not share this medicine with others. What if I miss a dose? If you miss a dose, take it as soon as you can. If it is almost time for your next dose, take only that dose. Do not take double or extra doses. What may interact with this medication? Bromocriptine Certain medications for blood pressure, heart disease, irregular heartbeat Cyclosporine Diuretics Medications for diabetes, including insulin Quinidine This list may not describe all possible interactions. Give your health care provider a list of all the medicines, herbs, non-prescription drugs, or dietary supplements you use. Also tell them if you smoke, drink alcohol, or use illegal drugs. Some items may interact with your medicine. What should I watch for while using this medication? Visit your care team for regular checks on your progress. Tell your care team if your symptoms do not start to get better or if they get worse. To help reduce irritation at the injection site, use a different site for each injection and make sure the solution is at room temperature before use. This medication may cause decreases in blood sugar. Signs of low blood sugar include chills, cool, pale skin or cold sweats, drowsiness, extreme hunger, fast heartbeat, headache, nausea, nervousness or anxiety, shakiness, trembling, unsteadiness, tiredness, or weakness. Contact your care team right away if you experience any of these symptoms. This medication may increase blood sugar. The risk may be higher in patients who already have diabetes. Ask your care team what you can do to lower your   risk of diabetes while taking this medication. You should make sure you get enough vitamin B12 while you are taking this medication. Discuss the foods you eat and the vitamins you take with your care team. What side effects may I notice from receiving this medication? Side effects that you should report to your care  team as soon as possible: Allergic reactions--skin rash, itching, hives, swelling of the face, lips, tongue, or throat Gallbladder problems--severe stomach pain, nausea, vomiting, fever Heart rhythm changes--fast or irregular heartbeat, dizziness, feeling faint or lightheaded, chest pain, trouble breathing High blood sugar (hyperglycemia)--increased thirst or amount of urine, unusual weakness or fatigue, blurry vision Low blood sugar (hypoglycemia)--tremors or shaking, anxiety, sweating, cold or clammy skin, confusion, dizziness, rapid heartbeat Low thyroid levels (hypothyroidism)--unusual weakness or fatigue, increased sensitivity to cold, constipation, hair loss, dry skin, weight gain, feelings of depression Low vitamin B12 level--pain, tingling, or numbness in the hands or feet, muscle weakness, dizziness, confusion, trouble concentrating Pancreatitis--severe stomach pain that spreads to your back or gets worse after eating or when touched, fever, nausea, vomiting Side effects that usually do not require medical attention (report to your care team if they continue or are bothersome): Diarrhea Dizziness Gas Headache Pain, redness, or irritation at injection site Stomach pain This list may not describe all possible side effects. Call your doctor for medical advice about side effects. You may report side effects to FDA at 1-800-FDA-1088. Where should I keep my medication? Keep out of the reach of children and pets. Store in the refrigerator. Protect from light. Allow to come to room temperature naturally. Do not use artificial heat. If protected from light, the injection may be stored between 20 and 30 degrees C (70 and 86 degrees F) for 14 days. After the initial use, throw away any unused portion of a multiple dose vial after 14 days. Get rid of any unused portions of the ampules after use. To get rid of medications that are no longer needed or have expired: Take the medication to a medication  take-back program. Ask your pharmacy or law enforcement to find a location. If you cannot return the medication, ask your pharmacist or care team how to get rid of the medication safely. NOTE: This sheet is a summary. It may not cover all possible information. If you have questions about this medicine, talk to your doctor, pharmacist, or health care provider.  2023 [employer]vier/Gold Standard (2007-06-05 00:00:00)  

## 2022-02-20 ENCOUNTER — Ambulatory Visit: Payer: Medicare HMO

## 2022-02-20 ENCOUNTER — Inpatient Hospital Stay: Payer: Medicare HMO

## 2022-02-20 ENCOUNTER — Other Ambulatory Visit: Payer: Medicare HMO

## 2022-02-20 ENCOUNTER — Inpatient Hospital Stay: Payer: Medicare HMO | Attending: Oncology

## 2022-02-20 VITALS — BP 106/67 | HR 92 | Temp 99.3°F | Resp 20 | Ht 64.0 in | Wt 181.8 lb

## 2022-02-20 DIAGNOSIS — Z79899 Other long term (current) drug therapy: Secondary | ICD-10-CM | POA: Insufficient documentation

## 2022-02-20 DIAGNOSIS — C7A012 Malignant carcinoid tumor of the ileum: Secondary | ICD-10-CM | POA: Diagnosis not present

## 2022-02-20 LAB — HEPATIC FUNCTION PANEL
ALT: 14 U/L (ref 7–35)
AST: 21 (ref 13–35)
Alkaline Phosphatase: 91 (ref 25–125)
Bilirubin, Total: 0.5

## 2022-02-20 LAB — CBC: RBC: 4.32 (ref 3.87–5.11)

## 2022-02-20 LAB — BASIC METABOLIC PANEL
BUN: 11 (ref 4–21)
CO2: 30 — AB (ref 13–22)
Chloride: 103 (ref 99–108)
Creatinine: 0.9 (ref 0.5–1.1)
Glucose: 96
Potassium: 3.6 mEq/L (ref 3.5–5.1)
Sodium: 138 (ref 137–147)

## 2022-02-20 LAB — CBC AND DIFFERENTIAL
HCT: 40 (ref 36–46)
Hemoglobin: 13.6 (ref 12.0–16.0)
Neutrophils Absolute: 2.52
Platelets: 228 10*3/uL (ref 150–400)
WBC: 9

## 2022-02-20 LAB — COMPREHENSIVE METABOLIC PANEL
Albumin: 4.2 (ref 3.5–5.0)
Calcium: 9.5 (ref 8.7–10.7)

## 2022-02-20 MED ORDER — OCTREOTIDE ACETATE 20 MG IM KIT
20.0000 mg | PACK | Freq: Once | INTRAMUSCULAR | Status: AC
  Administered 2022-02-20: 20 mg via INTRAMUSCULAR

## 2022-02-20 NOTE — Patient Instructions (Signed)
Octreotide Injection Solution What is this medication? OCTREOTIDE (ok TREE oh tide) treats high levels of growth hormone (acromegaly). It works by reducing the amount of growth hormone your body makes. This reduces symptoms and the risk of health problems caused by too much growth hormone, such as diabetes and heart disease. It may also be used to treat diarrhea caused by neuroendocrine tumors. It works by slowing down the release of serotonin from the tumor cells. This reduces the number of bowel movements you have. This medicine may be used for other purposes; ask your health care provider or pharmacist if you have questions. COMMON BRAND NAME(S): Bynfezia, Sandostatin What should I tell my care team before I take this medication? They need to know if you have any of these conditions: Diabetes Gallbladder disease Kidney disease Liver disease Thyroid disease An unusual or allergic reaction to octreotide, other medications, foods, dyes, or preservatives Pregnant or trying to get pregnant Breast-feeding How should I use this medication? This medication is injected under the skin or into a vein. It is usually given by your care team in a hospital or clinic setting. If you get this medication at home, you will be taught how to prepare and give it. Use exactly as directed. Take it as directed on the prescription label at the same time every day. Keep taking it unless your care team tells you to stop. Allow the injection solution to come to room temperature before use. Do not warm it artificially. It is important that you put your used needles and syringes in a special sharps container. Do not put them in a trash can. If you do not have a sharps container, call your pharmacist or care team to get one. Talk to your care team about the use of this medication in children. Special care may be needed. Overdosage: If you think you have taken too much of this medicine contact a poison control center or  emergency room at once. NOTE: This medicine is only for you. Do not share this medicine with others. What if I miss a dose? If you miss a dose, take it as soon as you can. If it is almost time for your next dose, take only that dose. Do not take double or extra doses. What may interact with this medication? Bromocriptine Certain medications for blood pressure, heart disease, irregular heartbeat Cyclosporine Diuretics Medications for diabetes, including insulin Quinidine This list may not describe all possible interactions. Give your health care provider a list of all the medicines, herbs, non-prescription drugs, or dietary supplements you use. Also tell them if you smoke, drink alcohol, or use illegal drugs. Some items may interact with your medicine. What should I watch for while using this medication? Visit your care team for regular checks on your progress. Tell your care team if your symptoms do not start to get better or if they get worse. To help reduce irritation at the injection site, use a different site for each injection and make sure the solution is at room temperature before use. This medication may cause decreases in blood sugar. Signs of low blood sugar include chills, cool, pale skin or cold sweats, drowsiness, extreme hunger, fast heartbeat, headache, nausea, nervousness or anxiety, shakiness, trembling, unsteadiness, tiredness, or weakness. Contact your care team right away if you experience any of these symptoms. This medication may increase blood sugar. The risk may be higher in patients who already have diabetes. Ask your care team what you can do to lower your   risk of diabetes while taking this medication. You should make sure you get enough vitamin B12 while you are taking this medication. Discuss the foods you eat and the vitamins you take with your care team. What side effects may I notice from receiving this medication? Side effects that you should report to your care  team as soon as possible: Allergic reactions--skin rash, itching, hives, swelling of the face, lips, tongue, or throat Gallbladder problems--severe stomach pain, nausea, vomiting, fever Heart rhythm changes--fast or irregular heartbeat, dizziness, feeling faint or lightheaded, chest pain, trouble breathing High blood sugar (hyperglycemia)--increased thirst or amount of urine, unusual weakness or fatigue, blurry vision Low blood sugar (hypoglycemia)--tremors or shaking, anxiety, sweating, cold or clammy skin, confusion, dizziness, rapid heartbeat Low thyroid levels (hypothyroidism)--unusual weakness or fatigue, increased sensitivity to cold, constipation, hair loss, dry skin, weight gain, feelings of depression Low vitamin B12 level--pain, tingling, or numbness in the hands or feet, muscle weakness, dizziness, confusion, trouble concentrating Pancreatitis--severe stomach pain that spreads to your back or gets worse after eating or when touched, fever, nausea, vomiting Side effects that usually do not require medical attention (report to your care team if they continue or are bothersome): Diarrhea Dizziness Gas Headache Pain, redness, or irritation at injection site Stomach pain This list may not describe all possible side effects. Call your doctor for medical advice about side effects. You may report side effects to FDA at 1-800-FDA-1088. Where should I keep my medication? Keep out of the reach of children and pets. Store in the refrigerator. Protect from light. Allow to come to room temperature naturally. Do not use artificial heat. If protected from light, the injection may be stored between 20 and 30 degrees C (70 and 86 degrees F) for 14 days. After the initial use, throw away any unused portion of a multiple dose vial after 14 days. Get rid of any unused portions of the ampules after use. To get rid of medications that are no longer needed or have expired: Take the medication to a medication  take-back program. Ask your pharmacy or law enforcement to find a location. If you cannot return the medication, ask your pharmacist or care team how to get rid of the medication safely. NOTE: This sheet is a summary. It may not cover all possible information. If you have questions about this medicine, talk to your doctor, pharmacist, or health care provider.  2023 [employer] Standard (2021-07-17 00:00:00)  

## 2022-03-17 ENCOUNTER — Telehealth: Payer: Self-pay | Admitting: Oncology

## 2022-03-17 NOTE — Telephone Encounter (Signed)
Pt has been IP at [facility] since last Wednesday, unable to schedule any appts at this time until she is discharged.   Scheduling Message Entered by Juanetta Beets on 03/17/2022 at 12:53 PM Priority: High  <No visit type provided>  Department: [facility]  Provider:  Appointment Notes:  Pt needs more sandostatin appts.  Next due 11/23 so could add for 11/22 or 11/27.  Scheduling Notes:

## 2022-03-27 ENCOUNTER — Encounter: Payer: Self-pay | Admitting: Hematology and Oncology

## 2022-03-27 ENCOUNTER — Inpatient Hospital Stay (INDEPENDENT_AMBULATORY_CARE_PROVIDER_SITE_OTHER): Payer: Medicare HMO | Admitting: Hematology and Oncology

## 2022-03-27 ENCOUNTER — Ambulatory Visit: Payer: Medicare HMO | Admitting: Oncology

## 2022-03-27 ENCOUNTER — Telehealth: Payer: Self-pay

## 2022-03-27 ENCOUNTER — Inpatient Hospital Stay: Payer: Medicare HMO | Attending: Oncology

## 2022-03-27 ENCOUNTER — Other Ambulatory Visit: Payer: Medicare HMO

## 2022-03-27 VITALS — BP 131/79 | HR 59 | Temp 98.3°F | Resp 20 | Ht 64.0 in | Wt 186.2 lb

## 2022-03-27 DIAGNOSIS — H109 Unspecified conjunctivitis: Secondary | ICD-10-CM | POA: Insufficient documentation

## 2022-03-27 DIAGNOSIS — C7A012 Malignant carcinoid tumor of the ileum: Secondary | ICD-10-CM

## 2022-03-27 DIAGNOSIS — R1084 Generalized abdominal pain: Secondary | ICD-10-CM

## 2022-03-27 DIAGNOSIS — B9689 Other specified bacterial agents as the cause of diseases classified elsewhere: Secondary | ICD-10-CM

## 2022-03-27 DIAGNOSIS — Z79899 Other long term (current) drug therapy: Secondary | ICD-10-CM | POA: Diagnosis not present

## 2022-03-27 DIAGNOSIS — G9589 Other specified diseases of spinal cord: Secondary | ICD-10-CM

## 2022-03-27 LAB — CMP (CANCER CENTER ONLY)
ALT: 13 U/L (ref 0–44)
AST: 16 U/L (ref 15–41)
Albumin: 3.7 g/dL (ref 3.5–5.0)
Alkaline Phosphatase: 89 U/L (ref 38–126)
Anion gap: 4 — ABNORMAL LOW (ref 5–15)
BUN: 13 mg/dL (ref 8–23)
CO2: 32 mmol/L (ref 22–32)
Calcium: 9.3 mg/dL (ref 8.9–10.3)
Chloride: 103 mmol/L (ref 98–111)
Creatinine: 0.95 mg/dL (ref 0.44–1.00)
GFR, Estimated: >60 mL/min (ref >60)
Glucose, Bld: 96 mg/dL (ref 70–99)
Potassium: 3.6 mmol/L (ref 3.5–5.1)
Sodium: 139 mmol/L (ref 135–145)
Total Bilirubin: 0.2 mg/dL — ABNORMAL LOW (ref 0.3–1.2)
Total Protein: 7.9 g/dL (ref 6.5–8.1)

## 2022-03-27 LAB — CBC WITH DIFFERENTIAL (CANCER CENTER ONLY)
Abs Immature Granulocytes: 0.03 10*3/uL (ref 0.00–0.07)
Basophils Absolute: 0 10*3/uL (ref 0.0–0.1)
Basophils Relative: 0 %
Eosinophils Absolute: 0.1 10*3/uL (ref 0.0–0.5)
Eosinophils Relative: 1 %
HCT: 41.3 % (ref 36.0–46.0)
Hemoglobin: 13.3 g/dL (ref 12.0–15.0)
Immature Granulocytes: 0 %
Lymphocytes Relative: 59 %
Lymphs Abs: 5.2 10*3/uL — ABNORMAL HIGH (ref 0.7–4.0)
MCH: 31 pg (ref 26.0–34.0)
MCHC: 32.2 g/dL (ref 30.0–36.0)
MCV: 96.3 fL (ref 80.0–100.0)
Monocytes Absolute: 0.6 10*3/uL (ref 0.1–1.0)
Monocytes Relative: 6 %
Neutro Abs: 3 10*3/uL (ref 1.7–7.7)
Neutrophils Relative %: 34 %
Platelet Count: 246 10*3/uL (ref 150–400)
RBC: 4.29 MIL/uL (ref 3.87–5.11)
RDW: 12.6 % (ref 11.5–15.5)
WBC Count: 8.9 10*3/uL (ref 4.0–10.5)
nRBC: 0 % (ref 0.0–0.2)

## 2022-03-27 MED ORDER — CIPROFLOXACIN HCL 0.3 % OP OINT: TOPICAL_OINTMENT | Freq: Three times a day (TID) | OPHTHALMIC | 0 refills | Status: DC

## 2022-03-27 NOTE — Assessment & Plan Note (Deleted)
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  She has had previous surgery in her neck.  This is not a primary neurologic problem, so we are managing her symptoms.  She continues to have persistent symptoms despite gabapentin, so had been using oxycodone 5 mg as needed, but states another provider increased this to 7.5 mg i.e. 1-1/2 tablets every 6 hours as needed.  She is also on gabapentin and Cymbalta.  She states she is using oxycodone about twice daily.

## 2022-03-27 NOTE — Telephone Encounter (Signed)
Referral faxed to Dr Lyda Jester.

## 2022-03-27 NOTE — Telephone Encounter (Signed)
-----   Message from [name], PA-C sent at 03/27/2022  4:00 PM EST ----- Please let her know her labs are normal.  Thank you

## 2022-03-27 NOTE — Progress Notes (Signed)
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  Seven of eighteen lymph nodes were involved (7/18).  Primary tumor measured 2.2 x 1.7 x 1.5 cm, with three apparent vascular metastases including a nodule up to 3 cm with metastatic tumor present within 1 mm of the inked radial margin.  In the distal appendix there was also a separate 3 mm carcinoid tumor.  This was a staged as a pT3pN1 with low mitotic rate (0 per 10 high powered fields).  [name] was sporadic.  We began seeing her in February 2022 for carcinoid syndrome symptoms.  Her chromogranin A was elevated at 262.  CT chest, abdomen and pelvis did not reveal any evidence of metastatic disease.  She was started on octreotide every 4 weeks.  She had normalization of her chromogranin A.  CT chest, abdomen and pelvis in August done by Dr. Melina Copa did not reveal any evidence of recurrence or metastatic disease.  Chromogranin A remained normal in July.    She was recently hospitalized at [facility] with worsening back pain.  MRI cervical, thoracic and lumbar spine revealed changes favored to represent discitis-osteomyelitis at C7-T3 and T10-T11 with phlegmonous changes in the surrounding soft tissues.  There was no evidence of epidural abscess.  She underwent biopsy at T10-T11 and culture was negative, so she was not felt to current discitis/osteomyelitis.  During her hospitalization GI referral was recommended for colonoscopy due to her history of Streptococcus gallolyticus bacteremia.  She apparently has an appointment today with GI at [facility], but was unaware and is unable to arrange transportation to this.  She requests a  referral locally, so we will refer her to Dr. Lyda Jester.  Due to her abdominal pain, I will obtain a CT abdomen and pelvis at this time for further evaluation for possible recurrent carcinoid.  She will continue octreotide every 4 weeks and plan to see her back in 12 weeks with a CBC, comprehensive metabolic panel and chromogranin A.  Bacterial conjunctivitis of both eyes Greenish discharge from both eyes with conjunctival erythema consistent with bacterial conjunctivitis.  I we will prescribe ciprofloxacin ophthalmic ointment to be used 3 times daily.  The patient knows to contact her primary care physician if this does not resolve.   The patient understands the plans discussed today and is in agreement with them.  She knows to contact our office if she develops concerns prior to her next appointment.  I provided 30 minutes of face-to-face time during this encounter and > 50% was spent counseling as documented under my assessment and plan.   [name], PA-C  [facility] AT [facility] [address] [place] [address] Dept: [phone] Dept Fax: [phone]   Orders Placed This Encounter  Procedures   CT Abdomen Pelvis W Contrast    Standing Status:   Future    Standing Expiration Date:   03/27/2023    Scheduling Instructions:     RH    Order Specific Question:   If indicated for the ordered procedure, I authorize the administration of contrast media per Radiology protocol    Answer:   Yes    Order Specific Question:  Does the patient have a contrast media/X-ray dye allergy?    Answer:   No    Order Specific Question:   Preferred imaging location?    Answer:   External    Order Specific Question:   Is Oral Contrast requested for this exam?    Answer:   Yes, Per Radiology protocol   Chromogranin A    Standing Status:   Standing    Number of Occurrences:   12    Standing Expiration Date:   03/28/2023   Ambulatory referral  to Gastroenterology    Referral Priority:   Routine    Referral Type:   Consultation    Referral Reason:   Specialty Services Required    Requested Specialty:   Gastroenterology    Number of Visits Requested:   1      CHIEF COMPLAINT:  CC: Malignant carcinoid  Current Treatment: Octreotide every 4 weeks  HISTORY OF PRESENT ILLNESS:  Kristi Romero is a 66 year old. female referred by Dr. Melina Copa for a transfer of care and further management of her history of neuroendocrine and carcinoid tumor, which was diagnosed in October 2016.  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  She underwent surgical resection with Dr. Zada Girt. Pathology confirmed well differentiated neuroendocrine tumor consistent with carcinoid tumor.  Seven of eighteen lymph nodes were involved (7/18).  Primary tumor measured 2.2 x 1.7 x 1.5 cm, with three apparent vascular metastases including a nodule up to 3 cm with metastatic tumor present within 1 mm of the inked radial margin.  In the distal appendix there was also a separate 3 mm carcinoid tumor.  This was a staged as a pT3pN1 with low mitotic rate (0 per 10 high powered fields).  She did require a PEG tube in 2017 for failure to thrive and this was removed in January 2018.  Her weight was up to 157 by September 2021.      Her last follow up was in January 2020 with Dr. Verl Blalock until we started seeing her in early February 2022.  Kristi Romero complained of weight loss, stomach pain and nausea.  She was evaluated by Dr. Melina Copa with endoscopy in November 2021 which revealed chronic gastritis and was negative for H.Pylori.  No evidence of malignancy was observed.  She also notes bilateral numbness "from her mouth to her feet".   She  receives B12 injections every 6 months.  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  Chromogranin A from February was elevated at 262.3. She also had an elevated serum protein. Serum protein electrophoresis did not reveal a monoclonal spike. CT imaging revealed adrenal adenomas but no obvious evidence of recurrent/progressive disease.  She was started on octreotide injection in March 2022 with good response based on her decreasing chromogranin A.  CT chest, abdomen and pelvis in August revealed postoperative changes of right hemicolectomy with enterocolonic anastomosis without evidence of local recurrence or abdominopelvic metastatic disease.  Stable left adrenal gland adenoma.  MRI brain in August revealed evidence of chronic ischemia. She had normalization of the Chromogranin A and it has remained normal.    She had pre-existing neuropathy, so was referred to neurology and diagnosed with myelomalacia of the cervical spine.  He  started her back on gabapentin and recommended she continue follow-up with [place].  The gabapentin has been titrated up to 600 mg 3 times a day.  MRI lumbar spine in March revealed progressive disc space narrowing at L5-S1 with unchanged mild bilateral neural foraminal stenosis; unchanged mild lateral recess and neural foraminal stenosis at L3-4 and L4-5.     She has had multiple ER visits and hospitalizations in the past several months.  She was admitted with sepsis in May.  She was found to have a right lower lobe subsegmental, nonocclusive pulmonary embolism in May.  She continues Eliquis for this.  She has had difficulty with constipation, and in June, she underwent enemas and disimpaction in the ER.  CT abdomen and pelvis in August did not reveal any evidence of recurrence or metastatic carcinoid.  She had been on oral iron supplement, but this was discontinued.  The chromogranin A remained normal in July.  She has continued  octreotide every 4 weeks.    Oncology History  Malignant carcinoid tumor of ileum ([place])  04/28/2014 Initial Diagnosis   Malignant carcinoid tumor of ileum ([place])   02/02/2015 Cancer Staging   Staging form: Neuroendocrine Tumor - Duodenum/Ampulla/Jejunum/Illeum, AJCC 7th Edition - Clinical stage from 02/02/2015: Stage IIIB (T3(3), N1, M0) - Signed by Derwood Kaplan, MD on 05/28/2020 Staged by: Managing physician Diagnostic confirmation: Positive histology Specimen type: Excision Histopathologic type: Carcinoid tumor, NOS Stage prefix: Initial diagnosis Tumor size (mm): 22 Multiple tumors: Yes Number of tumors: 3 Histologic grade (G): G1 Lymph-vascular invasion (LVI): LVI not present (absent)/not identified Residual tumor (R): R0 - None Stage used in treatment planning: Yes National guidelines used in treatment planning: Yes Type of national guideline used in treatment planning: NCCN Staging comments: Surgical resection       INTERVAL HISTORY:  Kristi Romero is here today for repeat clinical assessment and states she was recently hospitalized at [facility] with increasing back pain.  Imaging was concerning for discitis/osteomyelitis, so she underwent biopsy with culture at T10-11.  The culture was negative, so she was not felt to have recurrent infection.  They recommended she see a gastroenterologist for colonoscopy due to the increased risk for colon cancer with Streptococcus gallolyticus bacteremia.  She is scheduled with GI at [facility] today, but states she is unable to go due to transportation issues.  She would prefer to see a local gastroenterologist.  She reports generalized abdominal pain, as well as nausea.  She denies vomiting or diarrhea.  She states she is moving her bowels regularly.  She feels her legs have been swelling.  She reports fatigue.  She denies fevers or chills. She denies pain. Her appetite is good. Her weight has increased 5 pounds over last 3 months .  She remains in a  wheelchair due to back pain and lower extremity neuropathy.  REVIEW OF SYSTEMS:  Review of Systems  Constitutional:  Positive for fatigue. Negative for appetite change, chills, fever and unexpected weight change.  HENT:   Negative for lump/mass, mouth sores and sore throat.   Respiratory:  Negative for cough and shortness of breath.   Cardiovascular:  Negative for chest pain and leg swelling.  Gastrointestinal:  Positive for abdominal pain and nausea. Negative for constipation, diarrhea and vomiting.  Endocrine: Negative for hot flashes.  Genitourinary:  Negative for difficulty urinating, dysuria, frequency and hematuria.   Musculoskeletal:  Positive for back pain. Negative for arthralgias and myalgias.  Skin:  Negative for rash.  Neurological:  Negative for  dizziness and headaches.  Hematological:  Negative for adenopathy. Does not bruise/bleed easily.  Psychiatric/Behavioral:  Negative for depression and sleep disturbance. The patient is not nervous/anxious.      VITALS:  Blood pressure 131/79, pulse (!) 59, temperature 98.3 F (36.8 C), temperature source Oral, resp. rate 20, height '5\' 4"'$  (1.626 m), weight 186 lb 3.2 oz (84.5 kg), SpO2 97 %.  Wt Readings from Last 3 Encounters:  03/27/22 186 lb 3.2 oz (84.5 kg)  02/20/22 181 lb 12 oz (82.4 kg)  01/23/22 171 lb (77.6 kg)    Body mass index is 31.96 kg/m.  Performance status (ECOG): 2 - Symptomatic, <50% confined to bed  PHYSICAL EXAM:  Physical Exam Vitals and nursing note reviewed.  Constitutional:      General: She is not in acute distress.    Appearance: Normal appearance.  HENT:     Head: Normocephalic and atraumatic.     Mouth/Throat:     Mouth: Mucous membranes are moist.     Pharynx: Oropharynx is clear. No oropharyngeal exudate or posterior oropharyngeal erythema.  Eyes:     General: No scleral icterus.    Extraocular Movements: Extraocular movements intact.     Conjunctiva/sclera: Conjunctivae normal.      Right eye: Exudate present.     Left eye: Exudate present.     Pupils: Pupils are equal, round, and reactive to light.     Comments: Conjunctivae are erythematous  Cardiovascular:     Rate and Rhythm: Normal rate and regular rhythm.     Heart sounds: Normal heart sounds. No murmur heard.    No friction rub. No gallop.  Pulmonary:     Effort: Pulmonary effort is normal.     Breath sounds: Normal breath sounds. No wheezing, rhonchi or rales.  Abdominal:     General: There is no distension.     Palpations: Abdomen is soft. There is no hepatomegaly, splenomegaly or mass.     Tenderness: There is abdominal tenderness in the right upper quadrant and left upper quadrant.  Musculoskeletal:        General: Normal range of motion.     Cervical back: Normal range of motion and neck supple. No tenderness.     Right lower leg: No edema.     Left lower leg: No edema.  Lymphadenopathy:     Cervical: No cervical adenopathy.     Upper Body:     Right upper body: No supraclavicular or axillary adenopathy.     Left upper body: No supraclavicular or axillary adenopathy.     Lower Body: No right inguinal adenopathy. No left inguinal adenopathy.  Skin:    General: Skin is warm and dry.     Coloration: Skin is not jaundiced.     Findings: No rash.  Neurological:     Mental Status: She is alert and oriented to person, place, and time.     Cranial Nerves: No cranial nerve deficit.  Psychiatric:        Mood and Affect: Mood normal.        Behavior: Behavior normal.        Thought Content: Thought content normal.    LABS:      Latest Ref Rng & Units 03/27/2022    1:25 PM 02/20/2022   12:00 AM 01/23/2022   12:00 AM  CBC  WBC 4.0 - 10.5 K/uL 8.9  9.0     8.2      Hemoglobin 12.0 - 15.0 g/dL 13.3  13.6     13.3      Hematocrit 36.0 - 46.0 % 41.3  40     39      Platelets 150 - 400 K/uL 246  228     235         This result is from an external source.      Latest Ref Rng & Units 03/27/2022     1:25 PM 02/20/2022   12:00 AM 01/23/2022   12:00 AM  CMP  Glucose 70 - 99 mg/dL 96     BUN 8 - 23 mg/dL '13  11     14      '$ Creatinine 0.44 - 1.00 mg/dL 0.95  0.9     0.8      Sodium 135 - 145 mmol/L 139  138     136      Potassium 3.5 - 5.1 mmol/L 3.6  3.6     3.8      Chloride 98 - 111 mmol/L 103  103     105      CO2 22 - 32 mmol/L 32  30     28      Calcium 8.9 - 10.3 mg/dL 9.3  9.5     9.5      Total Protein 6.5 - 8.1 g/dL 7.9     Total Bilirubin 0.3 - 1.2 mg/dL 0.2     Alkaline Phos 38 - 126 U/L 89  91     83      AST 15 - 41 U/L '16  21     21      '$ ALT 0 - 44 U/L '13  14     12         '$ This result is from an external source.     No results found for: "CEA1", "CEA" / No results found for: "CEA1", "CEA" No results found for: "PSA1" No results found for: "BWL893" No results found for: "CAN125"  Lab Results  Component Value Date   TOTALPROTELP 6.6 06/01/2020   ALBUMINELP 3.1 06/01/2020   A1GS 0.3 06/01/2020   A2GS 0.8 06/01/2020   BETS 1.1 06/01/2020   GAMS 1.4 06/01/2020   MSPIKE Not Observed 06/01/2020   SPEI Comment 06/01/2020   Lab Results  Component Value Date   TIBC 244 (L) 09/06/2021   FERRITIN 873 (H) 09/06/2021   IRONPCTSAT 9 (L) 09/06/2021   No results found for: "LDH"  STUDIES:  No results found.    HISTORY:   Past Medical History:  Diagnosis Date   Abdominal pain, epigastric    Accident occurring in home 05/07/2016   Acute respiratory failure with hypoxia ([place]) 09/06/2021   Adrenal adenoma 03/16/2018   Anxiety and depression 03/16/2018   BMI 26.0-26.9,adult    Cervicogenic headache 10/14/2016   Chest pain, unspecified    Chronic abdominal pain and nausea  09/06/2021   Chronic back pain    Constipation 04/08/2021   Dizziness 10/14/2016   Elevated lipase 01/13/2017   Formatting of this note might be different from the original. 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   Normocytic anemia 09/06/2021   Numbness    Numbness and tingling 10/14/2016   Pulmonary embolism with acute respiratory failure with hypoxia  09/06/2021   Restless leg syndrome    Sleeping difficulty 04/18/2016   Spondylosis, cervical, with myelopathy 12/28/2014   Thyroid nodule 07/22/2016   Urinary urgency 05/07/2016   Ventral hernia 05/07/2016   Vitamin D deficiency 01/13/2017   Vomiting 03/17/2018    Past Surgical History:  Procedure Laterality Date   BIOPSY  03/16/2018   Procedure: BIOPSY;  Surgeon: Ladene Artist, MD;  Location: [facility] ENDOSCOPY;  Service: Endoscopy;;   COLONOSCOPY  2018   said they removed a mass. Dr Harl Favor [place]    COLONOSCOPY  09/05/2020   Dr Orlena Sheldon Benign neoplasm of transverse colon. Benign neoplasm of descending colon.   ESOPHAGOGASTRODUODENOSCOPY  02/2018   ESOPHAGOGASTRODUODENOSCOPY (EGD) WITH PROPOFOL N/A 03/16/2018   Procedure: ESOPHAGOGASTRODUODENOSCOPY (EGD) WITH PROPOFOL;  Surgeon: Ladene Artist, MD;  Location: [place];  Service: Endoscopy;  Laterality: N/A;   NECK SURGERY     PARTIAL HYSTERECTOMY     TOE SURGERY Bilateral     Family History  Problem Relation Age of Onset   Lung cancer Father    Heart disease Father    Hypertension Father    Heart disease Mother    Hypertension Mother    Cancer Mother     Social History:  reports that she has quit smoking. She has never used smokeless tobacco. She reports current drug use. Drug: Marijuana. She reports that she does not drink alcohol.The  patient is alone today.  Allergies:  Allergies  Allergen Reactions   Atorvastatin Anaphylaxis   Codeine Nausea Only, Nausea And Vomiting and Shortness Of Breath    Other reaction(s): GI Upset (intolerance)   Zestril [Lisinopril] Anaphylaxis and Swelling    Current Medications: Current Outpatient Medications  Medication Sig Dispense Refill   ciprofloxacin (CILOXAN) 0.3 % ophthalmic ointment Place into both eyes 3 (three) times daily. 3.5 g 0   gabapentin (NEURONTIN) 600 MG tablet Take 600 mg by mouth 3 (three) times daily. Per patient     pregabalin (LYRICA) 50 MG capsule Take by mouth.     rosuvastatin (CRESTOR) 10 MG tablet      acetaminophen (TYLENOL) 500 MG tablet Take 500 mg by mouth every 6 (six) hours as needed for mild pain.     apixaban (ELIQUIS) 5 MG TABS tablet Take 1 tablet (5 mg total) by mouth 2 (two) times daily. 60 tablet 0   baclofen (LIORESAL) 10 MG tablet Take 10 mg by mouth 2 (two) times daily.     dicyclomine (BENTYL) 20 MG tablet Take 20 mg by mouth 4 (four) times daily as needed for spasms.     DULoxetine (CYMBALTA) 60 MG capsule Take 60 mg by mouth daily.     LINZESS 290 MCG CAPS capsule Take 290 mcg by mouth daily. (Patient not taking: Reported on 03/27/2022)     meclizine (ANTIVERT) 25 MG tablet Take 25 mg by mouth 4 (four) times daily as needed for dizziness.     mirtazapine (REMERON) 15 MG tablet Take 15 mg by mouth at bedtime.     ondansetron (ZOFRAN) 4 MG tablet Take 1 tablet (4 mg total) by mouth every 6 (six) hours. 12 tablet 0   ondansetron (ZOFRAN-ODT) 4 MG  disintegrating tablet Take 4 mg by mouth 2 (two) times daily as needed for nausea or vomiting.     oxycodone (OXY-IR) 5 MG capsule Take 5 mg by mouth every 6 (six) hours as needed for pain.     pantoprazole (PROTONIX) 40 MG tablet Take 40 mg by mouth daily.     sucralfate (CARAFATE) 1 g tablet Take 1 g by mouth daily.     No current facility-administered medications for this visit.

## 2022-03-27 NOTE — Assessment & Plan Note (Signed)
Greenish discharge from both eyes with conjunctival erythema consistent with bacterial conjunctivitis.  I we will prescribe ciprofloxacin ophthalmic ointment to be used 3 times daily.  The patient knows to contact her primary care physician if this does not resolve.

## 2022-03-27 NOTE — Telephone Encounter (Signed)
Spoke with patients daughter Corey Skains she will let patient know labs are normal.

## 2022-03-27 NOTE — Assessment & Plan Note (Addendum)
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  Seven of eighteen lymph nodes were involved (7/18).  Primary tumor measured 2.2 x 1.7 x 1.5 cm, with three apparent vascular metastases including a nodule up to 3 cm with metastatic tumor present within 1 mm of the inked radial margin.  In the distal appendix there was also a separate 3 mm carcinoid tumor.  This was a staged as a pT3pN1 with low mitotic rate (0 per 10 high powered fields).  [name] was sporadic.  We began seeing her in February 2022 for carcinoid syndrome symptoms.  Her chromogranin A was elevated at 262.  CT chest, abdomen and pelvis did not reveal any evidence of metastatic disease.  She was started on octreotide every 4 weeks.  She had normalization of her chromogranin A.  CT chest, abdomen and pelvis in August done by Dr. Melina Copa did not reveal any evidence of recurrence or metastatic disease.  Chromogranin A remained normal in July.    She was recently hospitalized at [facility] with worsening back pain.  MRI cervical, thoracic and lumbar spine revealed changes favored to represent discitis-osteomyelitis at C7-T3 and T10-T11 with phlegmonous changes in the surrounding soft tissues.  There was no evidence of epidural abscess.  She underwent biopsy at T10-T11 and culture was negative, so she was not felt to current discitis/osteomyelitis.  During her hospitalization GI referral was recommended for colonoscopy due to her history of Streptococcus gallolyticus bacteremia.  She apparently has an appointment today with GI at [facility], but was unaware and is unable to arrange transportation to this.  She requests a referral locally, so we will refer her to Dr. Lyda Jester.  Due to her abdominal pain, I will obtain a CT abdomen and pelvis at this time for further evaluation for possible recurrent carcinoid.  She will continue octreotide every 4 weeks and plan to see her back  in 12 weeks with a CBC, comprehensive metabolic panel and chromogranin A.

## 2022-03-27 NOTE — Telephone Encounter (Signed)
-----   Message from [name], PA-C sent at 03/27/2022  2:17 PM EST ----- Please refer to Dr. Lyda Jester for colonoscopy. History of malignant carcinoid, history of Streptococcus gallolyticus bacteremia and generalized abdominal pain. CT abdomen and pelvis ordered. Apparently scheduled at [facility] with GI, but she was not aware and does not have transportation. Thank you

## 2022-03-28 ENCOUNTER — Encounter: Payer: Self-pay | Admitting: Oncology

## 2022-03-31 ENCOUNTER — Telehealth: Payer: Self-pay | Admitting: Hematology and Oncology

## 2022-03-31 ENCOUNTER — Inpatient Hospital Stay: Payer: Medicare HMO | Attending: Hematology and Oncology

## 2022-03-31 ENCOUNTER — Other Ambulatory Visit: Payer: Self-pay | Admitting: Oncology

## 2022-03-31 VITALS — BP 130/73 | HR 70 | Temp 98.5°F | Resp 20

## 2022-03-31 DIAGNOSIS — C7A012 Malignant carcinoid tumor of the ileum: Secondary | ICD-10-CM | POA: Insufficient documentation

## 2022-03-31 MED ORDER — OCTREOTIDE ACETATE 20 MG IM KIT
20.0000 mg | PACK | Freq: Once | INTRAMUSCULAR | Status: AC
  Administered 2022-03-31: 20 mg via INTRAMUSCULAR
  Filled 2022-03-31: qty 1

## 2022-03-31 NOTE — Patient Instructions (Signed)
Octreotide Injection Solution What is this medication? OCTREOTIDE (ok TREE oh tide) treats high levels of growth hormone (acromegaly). It works by reducing the amount of growth hormone your body makes. This reduces symptoms and the risk of health problems caused by too much growth hormone, such as diabetes and heart disease. It may also be used to treat diarrhea caused by neuroendocrine tumors. It works by slowing down the release of serotonin from the tumor cells. This reduces the number of bowel movements you have. This medicine may be used for other purposes; ask your health care provider or pharmacist if you have questions. COMMON BRAND NAME(S): Bynfezia, Sandostatin What should I tell my care team before I take this medication? They need to know if you have any of these conditions: Diabetes Gallbladder disease Kidney disease Liver disease Thyroid disease An unusual or allergic reaction to octreotide, other medications, foods, dyes, or preservatives Pregnant or trying to get pregnant Breast-feeding How should I use this medication? This medication is injected under the skin or into a vein. It is usually given by your care team in a hospital or clinic setting. If you get this medication at home, you will be taught how to prepare and give it. Use exactly as directed. Take it as directed on the prescription label at the same time every day. Keep taking it unless your care team tells you to stop. Allow the injection solution to come to room temperature before use. Do not warm it artificially. It is important that you put your used needles and syringes in a special sharps container. Do not put them in a trash can. If you do not have a sharps container, call your pharmacist or care team to get one. Talk to your care team about the use of this medication in children. Special care may be needed. Overdosage: If you think you have taken too much of this medicine contact a poison control center or  emergency room at once. NOTE: This medicine is only for you. Do not share this medicine with others. What if I miss a dose? If you miss a dose, take it as soon as you can. If it is almost time for your next dose, take only that dose. Do not take double or extra doses. What may interact with this medication? Bromocriptine Certain medications for blood pressure, heart disease, irregular heartbeat Cyclosporine Diuretics Medications for diabetes, including insulin Quinidine This list may not describe all possible interactions. Give your health care provider a list of all the medicines, herbs, non-prescription drugs, or dietary supplements you use. Also tell them if you smoke, drink alcohol, or use illegal drugs. Some items may interact with your medicine. What should I watch for while using this medication? Visit your care team for regular checks on your progress. Tell your care team if your symptoms do not start to get better or if they get worse. To help reduce irritation at the injection site, use a different site for each injection and make sure the solution is at room temperature before use. This medication may cause decreases in blood sugar. Signs of low blood sugar include chills, cool, pale skin or cold sweats, drowsiness, extreme hunger, fast heartbeat, headache, nausea, nervousness or anxiety, shakiness, trembling, unsteadiness, tiredness, or weakness. Contact your care team right away if you experience any of these symptoms. This medication may increase blood sugar. The risk may be higher in patients who already have diabetes. Ask your care team what you can do to lower your   risk of diabetes while taking this medication. You should make sure you get enough vitamin B12 while you are taking this medication. Discuss the foods you eat and the vitamins you take with your care team. What side effects may I notice from receiving this medication? Side effects that you should report to your care  team as soon as possible: Allergic reactions--skin rash, itching, hives, swelling of the face, lips, tongue, or throat Gallbladder problems--severe stomach pain, nausea, vomiting, fever Heart rhythm changes--fast or irregular heartbeat, dizziness, feeling faint or lightheaded, chest pain, trouble breathing High blood sugar (hyperglycemia)--increased thirst or amount of urine, unusual weakness or fatigue, blurry vision Low blood sugar (hypoglycemia)--tremors or shaking, anxiety, sweating, cold or clammy skin, confusion, dizziness, rapid heartbeat Low thyroid levels (hypothyroidism)--unusual weakness or fatigue, increased sensitivity to cold, constipation, hair loss, dry skin, weight gain, feelings of depression Low vitamin B12 level--pain, tingling, or numbness in the hands or feet, muscle weakness, dizziness, confusion, trouble concentrating Pancreatitis--severe stomach pain that spreads to your back or gets worse after eating or when touched, fever, nausea, vomiting Side effects that usually do not require medical attention (report to your care team if they continue or are bothersome): Diarrhea Dizziness Gas Headache Pain, redness, or irritation at injection site Stomach pain This list may not describe all possible side effects. Call your doctor for medical advice about side effects. You may report side effects to FDA at 1-800-FDA-1088. Where should I keep my medication? Keep out of the reach of children and pets. Store in the refrigerator. Protect from light. Allow to come to room temperature naturally. Do not use artificial heat. If protected from light, the injection may be stored between 20 and 30 degrees C (70 and 86 degrees F) for 14 days. After the initial use, throw away any unused portion of a multiple dose vial after 14 days. Get rid of any unused portions of the ampules after use. To get rid of medications that are no longer needed or have expired: Take the medication to a medication  take-back program. Ask your pharmacy or law enforcement to find a location. If you cannot return the medication, ask your pharmacist or care team how to get rid of the medication safely. NOTE: This sheet is a summary. It may not cover all possible information. If you have questions about this medicine, talk to your doctor, pharmacist, or health care provider.  2023 [employer] Standard (2021-07-17 00:00:00)  

## 2022-03-31 NOTE — Telephone Encounter (Signed)
03/31/22 Spoke with aide and gave information on scans scheduled on 04/03/22'@9am'$ 

## 2022-04-16 ENCOUNTER — Encounter: Payer: Self-pay | Admitting: Hematology and Oncology

## 2022-04-16 ENCOUNTER — Telehealth: Payer: Self-pay

## 2022-04-16 NOTE — Telephone Encounter (Signed)
Kristi Romero notified on patients contact list, she voiced understanding and will notify patient.

## 2022-04-16 NOTE — Telephone Encounter (Signed)
-----   Message from [name], PA-C sent at 04/16/2022  2:01 PM EST ----- Please let her know her scan looked good. Thanks

## 2022-04-24 ENCOUNTER — Inpatient Hospital Stay: Payer: Medicare HMO

## 2022-04-24 DIAGNOSIS — C7A012 Malignant carcinoid tumor of the ileum: Secondary | ICD-10-CM | POA: Diagnosis not present

## 2022-04-25 LAB — CHROMOGRANIN A: Chromogranin A (ng/mL): 37.8 ng/mL (ref 0.0–101.8)

## 2022-04-28 ENCOUNTER — Encounter: Payer: Self-pay | Admitting: Oncology

## 2022-04-29 ENCOUNTER — Inpatient Hospital Stay: Payer: Medicare Other | Attending: Hematology and Oncology

## 2022-04-29 VITALS — BP 132/74 | HR 77 | Temp 98.0°F | Resp 18 | Wt 183.0 lb

## 2022-04-29 DIAGNOSIS — C7A012 Malignant carcinoid tumor of the ileum: Secondary | ICD-10-CM | POA: Insufficient documentation

## 2022-04-29 DIAGNOSIS — Z79899 Other long term (current) drug therapy: Secondary | ICD-10-CM | POA: Insufficient documentation

## 2022-04-29 MED ORDER — OCTREOTIDE ACETATE 20 MG IM KIT
20.0000 mg | PACK | Freq: Once | INTRAMUSCULAR | Status: AC
  Administered 2022-04-29: 20 mg via INTRAMUSCULAR
  Filled 2022-04-29: qty 1

## 2022-04-29 NOTE — Patient Instructions (Signed)
Octreotide Injection Solution What is this medication? OCTREOTIDE (ok TREE oh tide) treats high levels of growth hormone (acromegaly). It works by reducing the amount of growth hormone your body makes. This reduces symptoms and the risk of health problems caused by too much growth hormone, such as diabetes and heart disease. It may also be used to treat diarrhea caused by neuroendocrine tumors. It works by slowing down the release of serotonin from the tumor cells. This reduces the number of bowel movements you have. This medicine may be used for other purposes; ask your health care provider or pharmacist if you have questions. COMMON BRAND NAME(S): Bynfezia, Sandostatin What should I tell my care team before I take this medication? They need to know if you have any of these conditions: Diabetes Gallbladder disease Kidney disease Liver disease Thyroid disease An unusual or allergic reaction to octreotide, other medications, foods, dyes, or preservatives Pregnant or trying to get pregnant Breast-feeding How should I use this medication? This medication is injected under the skin or into a vein. It is usually given by your care team in a hospital or clinic setting. If you get this medication at home, you will be taught how to prepare and give it. Use exactly as directed. Take it as directed on the prescription label at the same time every day. Keep taking it unless your care team tells you to stop. Allow the injection solution to come to room temperature before use. Do not warm it artificially. It is important that you put your used needles and syringes in a special sharps container. Do not put them in a trash can. If you do not have a sharps container, call your pharmacist or care team to get one. Talk to your care team about the use of this medication in children. Special care may be needed. Overdosage: If you think you have taken too much of this medicine contact a poison control center or  emergency room at once. NOTE: This medicine is only for you. Do not share this medicine with others. What if I miss a dose? If you miss a dose, take it as soon as you can. If it is almost time for your next dose, take only that dose. Do not take double or extra doses. What may interact with this medication? Bromocriptine Certain medications for blood pressure, heart disease, irregular heartbeat Cyclosporine Diuretics Medications for diabetes, including insulin Quinidine This list may not describe all possible interactions. Give your health care provider a list of all the medicines, herbs, non-prescription drugs, or dietary supplements you use. Also tell them if you smoke, drink alcohol, or use illegal drugs. Some items may interact with your medicine. What should I watch for while using this medication? Visit your care team for regular checks on your progress. Tell your care team if your symptoms do not start to get better or if they get worse. To help reduce irritation at the injection site, use a different site for each injection and make sure the solution is at room temperature before use. This medication may cause decreases in blood sugar. Signs of low blood sugar include chills, cool, pale skin or cold sweats, drowsiness, extreme hunger, fast heartbeat, headache, nausea, nervousness or anxiety, shakiness, trembling, unsteadiness, tiredness, or weakness. Contact your care team right away if you experience any of these symptoms. This medication may increase blood sugar. The risk may be higher in patients who already have diabetes. Ask your care team what you can do to lower your   risk of diabetes while taking this medication. You should make sure you get enough vitamin B12 while you are taking this medication. Discuss the foods you eat and the vitamins you take with your care team. What side effects may I notice from receiving this medication? Side effects that you should report to your care  team as soon as possible: Allergic reactions--skin rash, itching, hives, swelling of the face, lips, tongue, or throat Gallbladder problems--severe stomach pain, nausea, vomiting, fever Heart rhythm changes--fast or irregular heartbeat, dizziness, feeling faint or lightheaded, chest pain, trouble breathing High blood sugar (hyperglycemia)--increased thirst or amount of urine, unusual weakness or fatigue, blurry vision Low blood sugar (hypoglycemia)--tremors or shaking, anxiety, sweating, cold or clammy skin, confusion, dizziness, rapid heartbeat Low thyroid levels (hypothyroidism)--unusual weakness or fatigue, increased sensitivity to cold, constipation, hair loss, dry skin, weight gain, feelings of depression Low vitamin B12 level--pain, tingling, or numbness in the hands or feet, muscle weakness, dizziness, confusion, trouble concentrating Pancreatitis--severe stomach pain that spreads to your back or gets worse after eating or when touched, fever, nausea, vomiting Side effects that usually do not require medical attention (report to your care team if they continue or are bothersome): Diarrhea Dizziness Gas Headache Pain, redness, or irritation at injection site Stomach pain This list may not describe all possible side effects. Call your doctor for medical advice about side effects. You may report side effects to FDA at 1-800-FDA-1088. Where should I keep my medication? Keep out of the reach of children and pets. Store in the refrigerator. Protect from light. Allow to come to room temperature naturally. Do not use artificial heat. If protected from light, the injection may be stored between 20 and 30 degrees C (70 and 86 degrees F) for 14 days. After the initial use, throw away any unused portion of a multiple dose vial after 14 days. Get rid of any unused portions of the ampules after use. To get rid of medications that are no longer needed or have expired: Take the medication to a medication  take-back program. Ask your pharmacy or law enforcement to find a location. If you cannot return the medication, ask your pharmacist or care team how to get rid of the medication safely. NOTE: This sheet is a summary. It may not cover all possible information. If you have questions about this medicine, talk to your doctor, pharmacist, or health care provider.  2023 [employer] Standard (2021-07-17 00:00:00)  

## 2022-05-15 ENCOUNTER — Encounter: Payer: Self-pay | Admitting: Oncology

## 2022-05-22 ENCOUNTER — Inpatient Hospital Stay (INDEPENDENT_AMBULATORY_CARE_PROVIDER_SITE_OTHER): Payer: Medicare Other | Admitting: Hematology and Oncology

## 2022-05-22 DIAGNOSIS — C7A012 Malignant carcinoid tumor of the ileum: Secondary | ICD-10-CM | POA: Diagnosis not present

## 2022-05-22 LAB — CBC WITH DIFFERENTIAL (CANCER CENTER ONLY)
Abs Immature Granulocytes: 0.03 10*3/uL (ref 0.00–0.07)
Basophils Absolute: 0 10*3/uL (ref 0.0–0.1)
Basophils Relative: 0 %
Eosinophils Absolute: 0 10*3/uL (ref 0.0–0.5)
Eosinophils Relative: 0 %
HCT: 41.3 % (ref 36.0–46.0)
Hemoglobin: 13.2 g/dL (ref 12.0–15.0)
Immature Granulocytes: 0 %
Lymphocytes Relative: 52 %
Lymphs Abs: 5.2 10*3/uL — ABNORMAL HIGH (ref 0.7–4.0)
MCH: 30.8 pg (ref 26.0–34.0)
MCHC: 32 g/dL (ref 30.0–36.0)
MCV: 96.3 fL (ref 80.0–100.0)
Monocytes Absolute: 0.8 10*3/uL (ref 0.1–1.0)
Monocytes Relative: 7 %
Neutro Abs: 4.3 10*3/uL (ref 1.7–7.7)
Neutrophils Relative %: 41 %
Platelet Count: 277 10*3/uL (ref 150–400)
RBC: 4.29 MIL/uL (ref 3.87–5.11)
RDW: 13.1 % (ref 11.5–15.5)
WBC Count: 10.4 10*3/uL (ref 4.0–10.5)
nRBC: 0 % (ref 0.0–0.2)

## 2022-05-22 NOTE — Progress Notes (Signed)
The patient is here for octreotide and asked to speak with someone regarding why she has to come for labs and injection monthly. Explained that she has malignant carcinoid, which a type of slow growing cancer usually treated with surgery. Then after her surgery, there was elevation of the tumor marker (chromogranin A) indicating the carcinoid was active. The monthly Sandostatin injection treats the carcinoid and her tumor marker has gone back to normal. Usually we continue the injections to keep the carcinoid under control. She expresses understanding. She will proceed with Sandostatin this week and keep her appointment with Dr. Hinton Rao in February.

## 2022-05-23 ENCOUNTER — Encounter: Payer: Self-pay | Admitting: Oncology

## 2022-05-23 LAB — CHROMOGRANIN A: Chromogranin A (ng/mL): 37.1 ng/mL (ref 0.0–101.8)

## 2022-05-27 ENCOUNTER — Inpatient Hospital Stay: Payer: Medicare Other

## 2022-05-27 VITALS — BP 153/74 | HR 99 | Temp 98.0°F | Resp 18 | Ht 64.0 in | Wt 190.1 lb

## 2022-05-27 DIAGNOSIS — C7A012 Malignant carcinoid tumor of the ileum: Secondary | ICD-10-CM

## 2022-05-27 MED ORDER — OCTREOTIDE ACETATE 20 MG IM KIT
20.0000 mg | PACK | Freq: Once | INTRAMUSCULAR | Status: AC
  Administered 2022-05-27: 20 mg via INTRAMUSCULAR
  Filled 2022-05-27: qty 1

## 2022-05-27 NOTE — Patient Instructions (Signed)
Octreotide Injection Solution What is this medication? OCTREOTIDE (ok TREE oh tide) treats high levels of growth hormone (acromegaly). It works by reducing the amount of growth hormone your body makes. This reduces symptoms and the risk of health problems caused by too much growth hormone, such as diabetes and heart disease. It may also be used to treat diarrhea caused by neuroendocrine tumors. It works by slowing down the release of serotonin from the tumor cells. This reduces the number of bowel movements you have. This medicine may be used for other purposes; ask your health care provider or pharmacist if you have questions. COMMON BRAND NAME(S): Bynfezia, Sandostatin What should I tell my care team before I take this medication? They need to know if you have any of these conditions: Diabetes Gallbladder disease Kidney disease Liver disease Thyroid disease An unusual or allergic reaction to octreotide, other medications, foods, dyes, or preservatives Pregnant or trying to get pregnant Breast-feeding How should I use this medication? This medication is injected under the skin or into a vein. It is usually given by your care team in a hospital or clinic setting. If you get this medication at home, you will be taught how to prepare and give it. Use exactly as directed. Take it as directed on the prescription label at the same time every day. Keep taking it unless your care team tells you to stop. Allow the injection solution to come to room temperature before use. Do not warm it artificially. It is important that you put your used needles and syringes in a special sharps container. Do not put them in a trash can. If you do not have a sharps container, call your pharmacist or care team to get one. Talk to your care team about the use of this medication in children. Special care may be needed. Overdosage: If you think you have taken too much of this medicine contact a poison control center or  emergency room at once. NOTE: This medicine is only for you. Do not share this medicine with others. What if I miss a dose? If you miss a dose, take it as soon as you can. If it is almost time for your next dose, take only that dose. Do not take double or extra doses. What may interact with this medication? Bromocriptine Certain medications for blood pressure, heart disease, irregular heartbeat Cyclosporine Diuretics Medications for diabetes, including insulin Quinidine This list may not describe all possible interactions. Give your health care provider a list of all the medicines, herbs, non-prescription drugs, or dietary supplements you use. Also tell them if you smoke, drink alcohol, or use illegal drugs. Some items may interact with your medicine. What should I watch for while using this medication? Visit your care team for regular checks on your progress. Tell your care team if your symptoms do not start to get better or if they get worse. To help reduce irritation at the injection site, use a different site for each injection and make sure the solution is at room temperature before use. This medication may cause decreases in blood sugar. Signs of low blood sugar include chills, cool, pale skin or cold sweats, drowsiness, extreme hunger, fast heartbeat, headache, nausea, nervousness or anxiety, shakiness, trembling, unsteadiness, tiredness, or weakness. Contact your care team right away if you experience any of these symptoms. This medication may increase blood sugar. The risk may be higher in patients who already have diabetes. Ask your care team what you can do to lower your   risk of diabetes while taking this medication. You should make sure you get enough vitamin B12 while you are taking this medication. Discuss the foods you eat and the vitamins you take with your care team. What side effects may I notice from receiving this medication? Side effects that you should report to your care  team as soon as possible: Allergic reactions--skin rash, itching, hives, swelling of the face, lips, tongue, or throat Gallbladder problems--severe stomach pain, nausea, vomiting, fever Heart rhythm changes--fast or irregular heartbeat, dizziness, feeling faint or lightheaded, chest pain, trouble breathing High blood sugar (hyperglycemia)--increased thirst or amount of urine, unusual weakness or fatigue, blurry vision Low blood sugar (hypoglycemia)--tremors or shaking, anxiety, sweating, cold or clammy skin, confusion, dizziness, rapid heartbeat Low thyroid levels (hypothyroidism)--unusual weakness or fatigue, increased sensitivity to cold, constipation, hair loss, dry skin, weight gain, feelings of depression Low vitamin B12 level--pain, tingling, or numbness in the hands or feet, muscle weakness, dizziness, confusion, trouble concentrating Pancreatitis--severe stomach pain that spreads to your back or gets worse after eating or when touched, fever, nausea, vomiting Side effects that usually do not require medical attention (report to your care team if they continue or are bothersome): Diarrhea Dizziness Gas Headache Pain, redness, or irritation at injection site Stomach pain This list may not describe all possible side effects. Call your doctor for medical advice about side effects. You may report side effects to FDA at 1-800-FDA-1088. Where should I keep my medication? Keep out of the reach of children and pets. Store in the refrigerator. Protect from light. Allow to come to room temperature naturally. Do not use artificial heat. If protected from light, the injection may be stored between 20 and 30 degrees C (70 and 86 degrees F) for 14 days. After the initial use, throw away any unused portion of a multiple dose vial after 14 days. Get rid of any unused portions of the ampules after use. To get rid of medications that are no longer needed or have expired: Take the medication to a medication  take-back program. Ask your pharmacy or law enforcement to find a location. If you cannot return the medication, ask your pharmacist or care team how to get rid of the medication safely. NOTE: This sheet is a summary. It may not cover all possible information. If you have questions about this medicine, talk to your doctor, pharmacist, or health care provider.  2023 Elsevier/Gold Standard (2021-07-17 00:00:00)

## 2022-06-19 ENCOUNTER — Inpatient Hospital Stay: Payer: Medicare Other | Attending: Hematology and Oncology

## 2022-06-19 ENCOUNTER — Other Ambulatory Visit: Payer: Self-pay | Admitting: Oncology

## 2022-06-19 ENCOUNTER — Inpatient Hospital Stay (INDEPENDENT_AMBULATORY_CARE_PROVIDER_SITE_OTHER): Payer: Medicare Other | Admitting: Oncology

## 2022-06-19 ENCOUNTER — Encounter: Payer: Self-pay | Admitting: Oncology

## 2022-06-19 VITALS — BP 132/76 | HR 63 | Temp 98.1°F | Resp 18 | Ht 64.0 in | Wt 187.9 lb

## 2022-06-19 DIAGNOSIS — C7A012 Malignant carcinoid tumor of the ileum: Secondary | ICD-10-CM

## 2022-06-19 DIAGNOSIS — Z79899 Other long term (current) drug therapy: Secondary | ICD-10-CM | POA: Insufficient documentation

## 2022-06-19 LAB — CBC WITH DIFFERENTIAL (CANCER CENTER ONLY)
Abs Immature Granulocytes: 0.02 10*3/uL (ref 0.00–0.07)
Basophils Absolute: 0 10*3/uL (ref 0.0–0.1)
Basophils Relative: 0 %
Eosinophils Absolute: 0 10*3/uL (ref 0.0–0.5)
Eosinophils Relative: 1 %
HCT: 41.6 % (ref 36.0–46.0)
Hemoglobin: 13.3 g/dL (ref 12.0–15.0)
Immature Granulocytes: 0 %
Lymphocytes Relative: 54 %
Lymphs Abs: 4.8 10*3/uL — ABNORMAL HIGH (ref 0.7–4.0)
MCH: 30.9 pg (ref 26.0–34.0)
MCHC: 32 g/dL (ref 30.0–36.0)
MCV: 96.5 fL (ref 80.0–100.0)
Monocytes Absolute: 0.8 10*3/uL (ref 0.1–1.0)
Monocytes Relative: 9 %
Neutro Abs: 3.1 10*3/uL (ref 1.7–7.7)
Neutrophils Relative %: 36 %
Platelet Count: 255 10*3/uL (ref 150–400)
RBC: 4.31 MIL/uL (ref 3.87–5.11)
RDW: 12.4 % (ref 11.5–15.5)
WBC Count: 8.8 10*3/uL (ref 4.0–10.5)
nRBC: 0 % (ref 0.0–0.2)

## 2022-06-19 LAB — CMP (CANCER CENTER ONLY)
ALT: 15 U/L (ref 0–44)
AST: 17 U/L (ref 15–41)
Albumin: 3.8 g/dL (ref 3.5–5.0)
Alkaline Phosphatase: 98 U/L (ref 38–126)
Anion gap: 10 (ref 5–15)
BUN: 9 mg/dL (ref 8–23)
CO2: 27 mmol/L (ref 22–32)
Calcium: 9.1 mg/dL (ref 8.9–10.3)
Chloride: 101 mmol/L (ref 98–111)
Creatinine: 1.12 mg/dL — ABNORMAL HIGH (ref 0.44–1.00)
GFR, Estimated: 54 mL/min — ABNORMAL LOW (ref >60)
Glucose, Bld: 101 mg/dL — ABNORMAL HIGH (ref 70–99)
Potassium: 3.2 mmol/L — ABNORMAL LOW (ref 3.5–5.1)
Sodium: 138 mmol/L (ref 135–145)
Total Bilirubin: 0.3 mg/dL (ref 0.3–1.2)
Total Protein: 7.5 g/dL (ref 6.5–8.1)

## 2022-06-19 NOTE — Progress Notes (Signed)
[facility]  [address] [place],  [place]  [id number] [phone]  Clinic Day: 06/19/22    Referring physician: Evangeline Gula, NP  ASSESSMENT & PLAN:   Assessment & Plan: 1.  History of neuroendocrine/carcinoid tumor of the ascending colon, diagnosed in October 2016, treated with surgical resection.  Seven of eighteen lymph nodes were involved (7/18).  Primary tumor measured 2.2 x 1.7 x 1.5 cm, with three apparent vascular metastases including a nodule up to 3 cm with metastatic tumor present within 1 mm of the inked radial margin.  In the distal appendix there was also a separate 3 mm carcinoid tumor.  This was a staged as a pT3pN1 with low mitotic rate (0 per 10 high powered fields).  [name] was sporadic.  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  24 hour urine for 5HIAA was normal.  Chromogranin A from February was elevated at 262.3, but CT imaging was stable with no evidence of residual or recurrent disease.  She was placed on monthly octreotide injections in March 2022 with good response based on her symptoms and her decreasing chromogranin A, last measuring 48.8 in August.  CT imaging from August remains without evidence of recurrent or metastatic disease.   2.  Nausea and regurgitation. No clear etiology has been found. Dr.Butler has seen her multiple times in the past.  3.  Paresthesias and numbness of both lower extremities for at least the last 6 months. She is unable to ambulate. 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 She was on a higher dose of gabapentin but felt this wasn't helping and so is down to 300 mg at bedtime.   4.  We searched for any central nervous system etiology for her problems and got an MRI of the brain which does show chronic ischemic changes.  It could be related to her marijuana usage as  well as her depression.   Plan: She does see her PCP tomorrow and will ask for any other recommendations. She will proceed with octreotide next Tuesday and continue with these monthly. We will see her back in 3-4 months with CBC, CMP and Chromogranin A for repeat evaluation. If she needs to see [place] sooner she knows to call.  Both she and her aide verbalize understanding of and agreement to the plans discussed today. They know to call the office should any new questions or concerns arise.  I provided 15 minutes of face-to-face time during this this encounter and > 50% was spent counseling as documented under my assessment and plan.     Derwood Kaplan, MD  [facility] [address] [place] [address] Dept: [phone] Dept Fax: [phone]   No orders of the defined types were placed in this encounter.      CHIEF COMPLAINT:  CC: Carcinoid syndrome  Current Treatment: Octreotide every 4 weeks   HISTORY OF PRESENT ILLNESS:  Kristi Romero is a 67 year old. female referred by Dr. Melina Copa for a transfer of care and further management of her history of neuroendocrine and carcinoid tumor, which was diagnosed in October 2016.  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  She underwent surgical resection with Dr. Zada Girt. Pathology confirmed well differentiated neuroendocrine tumor consistent with carcinoid tumor.  Seven of eighteen lymph nodes were involved (7/18).  Primary tumor measured 2.2 x 1.7 x 1.5 cm, with three apparent vascular metastases including a nodule up to 3 cm with metastatic tumor present within 1 mm of the inked radial margin.  In the distal appendix there  was also a separate 3 mm carcinoid tumor.  This was a staged as a pT3pN1 with low mitotic rate (0 per 10 high powered fields).  She did require a PEG tube in 2017 for failure to thrive and this was removed in January 2018.  Her weight was up to 157 by September 2021.   Her last follow up was in January 2020 with Dr. Verl Blalock until we started seeing her in early February 2022.  Kristi Romero complained of weight loss, stomach pain and nausea.  She was evaluated by Dr. Melina Copa with endoscopy in November 2021 which revealed chronic gastritis and was negative for H.Pylori.  No evidence of malignancy was observed.  She also notes bilateral numbness "from her mouth to her feet".   She receives B12 injections every 6 months.  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  Chromogranin A from February was elevated at 262.3. She also had an elevated serum protein. Serum protein electrophoresis did not reveal a monoclonal spike. CT imaging revealed adrenal adenomas but no obvious evidence of recurrent/progressive disease.  She was started on octreotide injection in March 2022 with good response based on her decreasing chromogranin A.  CT chest, abdomen and pelvis in August revealed postoperative changes of right hemicolectomy with enterocolonic anastomosis without evidence of local recurrence or abdominopelvic metastatic disease.  Stable left adrenal gland adenoma.  MRI scan of the brain in August revealed evidence of chronic ischemia. She had normalization of the Chromogranin A in April. Chromogranin A from August was 48.8 down from 61.4 in April. The chromogranin A was 51 in September.  She continues octreotide every 4 weeks.   INTERVAL HISTORY:  Kristi Romero is here today for repeat clinical assessment prior to her next octreotide. She reports numbness and paresthesias of the bilateral legs, which makes it difficult for her to ambulate. She also is experiencing pain in her neck as  well. I have suggested she speak with an orthopedic doctor if pain worsens. We know that her x-rays and scans shows significant degenerative disc disease and degenerative joint disease. She has been diagnosed with neuropathy as well. She also notes that her stomach hurts occasionally. She also notes occasional hot flashes. She does see her PCP tomorrow and will ask for any other recommendations. Her  appetite is good, and she has lost 3 pounds since her last visit.  She denies fever, chills or other signs of infection.  She denies nausea, vomiting, bowel issues, or abdominal pain.  She denies sore throat, cough, dyspnea, or chest pain.  REVIEW OF SYSTEMS:  Review of Systems  Constitutional: Negative.  Negative for appetite change, chills, fatigue, fever and unexpected weight change.  HENT:  Negative.    Eyes: Negative.   Respiratory: Negative.  Negative for chest tightness, cough, hemoptysis, shortness of breath and wheezing.   Cardiovascular: Negative.  Negative for chest pain, leg swelling and palpitations.  Gastrointestinal:  Positive for nausea. Negative for abdominal distention, abdominal pain, blood in stool,  constipation, diarrhea and vomiting.  Endocrine: Negative.   Genitourinary: Negative.  Negative for difficulty urinating, dysuria, frequency and hematuria.   Musculoskeletal:  Positive for gait problem (in a wheelchair due to pain with ambulation) and neck pain (occasional). Negative for arthralgias, back pain, flank pain and myalgias.  Skin: Negative.   Neurological:  Positive for gait problem (in a wheelchair due to pain with ambulation) and numbness (and paresthesias of the bilateral legs). Negative for dizziness, extremity weakness, headaches, light-headedness, seizures and speech difficulty.  Hematological: Negative.   Psychiatric/Behavioral: Negative.  Negative for depression and sleep disturbance. The patient is not nervous/anxious.      VITALS:  Blood pressure 132/76, pulse 63,  temperature 98.1 F (36.7 C), temperature source Oral, resp. rate 18, height '5\' 4"'$  (1.626 m), weight 187 lb 14.4 oz (85.2 kg), SpO2 100 %.  Wt Readings from Last 3 Encounters:  06/24/22 190 lb (86.2 kg)  06/19/22 187 lb 14.4 oz (85.2 kg)  05/27/22 190 lb 1.3 oz (86.2 kg)    Body mass index is 32.25 kg/m.  Performance status (ECOG): 2 - Symptomatic, <50% confined to bed  PHYSICAL EXAM:  Physical Exam Constitutional:      General: She is not in acute distress.    Appearance: Normal appearance. She is normal weight.  HENT:     Head: Normocephalic and atraumatic.  Eyes:     General: No scleral icterus.    Extraocular Movements: Extraocular movements intact.     Conjunctiva/sclera: Conjunctivae normal.     Pupils: Pupils are equal, round, and reactive to light.  Cardiovascular:     Rate and Rhythm: Normal rate and regular rhythm.     Pulses: Normal pulses.     Heart sounds: Normal heart sounds. No murmur heard.    No friction rub. No gallop.  Pulmonary:     Effort: Pulmonary effort is normal. No respiratory distress.     Breath sounds: Normal breath sounds.  Abdominal:     General: Bowel sounds are normal. There is no distension.     Palpations: Abdomen is soft. There is no hepatomegaly, splenomegaly or mass.     Tenderness: There is no abdominal tenderness.  Musculoskeletal:        General: Normal range of motion.     Cervical back: Normal range of motion and neck supple.     Right lower leg: No edema.     Left lower leg: No edema.  Lymphadenopathy:     Cervical: No cervical adenopathy.  Skin:    General: Skin is warm and dry.  Neurological:     General: No focal deficit present.     Mental Status: She is alert and oriented to person, place, and time. Mental status is at baseline.  Psychiatric:        Mood and Affect: Mood normal.        Behavior: Behavior normal.        Thought Content: Thought content normal.        Judgment: Judgment normal.     LABS:       Latest Ref Rng & Units 06/19/2022    3:16 PM 05/22/2022    2:23 PM 03/27/2022    1:25 PM  CBC  WBC 4.0 - 10.5 K/uL 8.8  10.4  8.9   Hemoglobin 12.0 - 15.0 g/dL 13.3  13.2  13.3   Hematocrit 36.0 - 46.0 % 41.6  41.3  41.3   Platelets 150 - 400 K/uL 255  277  246       Latest Ref Rng & Units 06/19/2022    3:16 PM 03/27/2022    1:25 PM 02/20/2022   12:00 AM  CMP  Glucose 70 - 99 mg/dL 101  96    BUN 8 - 23 mg/dL '9  13  11      '$ Creatinine 0.44 - 1.00 mg/dL 1.12  0.95  0.9      Sodium 135 - 145 mmol/L 138  139  138      Potassium 3.5 - 5.1 mmol/L 3.2  3.6  3.6      Chloride 98 - 111 mmol/L 101  103  103      CO2 22 - 32 mmol/L 27  32  30      Calcium 8.9 - 10.3 mg/dL 9.1  9.3  9.5      Total Protein 6.5 - 8.1 g/dL 7.5  7.9    Total Bilirubin 0.3 - 1.2 mg/dL 0.3  0.2    Alkaline Phos 38 - 126 U/L 98  89  91      AST 15 - 41 U/L '17  16  21      '$ ALT 0 - 44 U/L '15  13  14         '$ This result is from an external source.    Lab Results  Component Value Date   TOTALPROTELP 6.6 06/01/2020   ALBUMINELP 3.1 06/01/2020   A1GS 0.3 06/01/2020   A2GS 0.8 06/01/2020   BETS 1.1 06/01/2020   GAMS 1.4 06/01/2020   MSPIKE Not Observed 06/01/2020   SPEI Comment 06/01/2020    STUDIES:  No results found.    HISTORY:   Allergies:  Allergies  Allergen Reactions   Atorvastatin Anaphylaxis   Codeine Nausea And Vomiting, Nausea Only and Shortness Of Breath    Other reaction(s): GI Upset (intolerance)  Other Reaction(s): upset stomach   Lisinopril Anaphylaxis and Swelling    Other Reaction(s): tongue swells up     Current Medications: Current Outpatient Medications  Medication Sig Dispense Refill   acetaminophen (TYLENOL) 500 MG tablet Take 500 mg by mouth every 6 (six) hours as needed for mild pain.     apixaban (ELIQUIS) 5 MG TABS tablet Take 1 tablet (5 mg total) by mouth 2 (two) times daily. 60 tablet 0   baclofen (LIORESAL) 10 MG tablet Take 10 mg by mouth 2 (two) times daily.      ciprofloxacin (CILOXAN) 0.3 % ophthalmic ointment Place into both eyes 3 (three) times daily. 3.5 g 0   dicyclomine (BENTYL) 20 MG tablet Take 20 mg by mouth 4 (four) times daily as needed for spasms.     DULoxetine (CYMBALTA) 60 MG capsule Take 60 mg by mouth daily.     gabapentin (NEURONTIN) 600 MG tablet Take 600 mg by mouth 3 (three) times daily. Per patient     LINZESS 290 MCG CAPS capsule Take 290 mcg by mouth daily. (Patient not taking: Reported on 03/27/2022)     meclizine (ANTIVERT) 25 MG tablet Take 25 mg by mouth 4 (four) times daily as needed for dizziness.     mirtazapine (REMERON) 15 MG tablet TAKE 1 TABLET AT BEDTIME 90 tablet 3   ondansetron (ZOFRAN) 4 MG tablet Take 1 tablet (4 mg total) by mouth every 6 (six) hours. 12 tablet 0   ondansetron (ZOFRAN-ODT) 4 MG disintegrating tablet Take 4 mg by mouth 2 (two) times daily as needed for nausea or vomiting.  oxycodone (OXY-IR) 5 MG capsule Take 5 mg by mouth every 6 (six) hours as needed for pain.     pantoprazole (PROTONIX) 40 MG tablet Take 40 mg by mouth daily.     potassium chloride (KLOR-CON M) 10 MEQ tablet Take 1 tablet (10 mEq total) by mouth 2 (two) times daily. 60 tablet 5   pregabalin (LYRICA) 50 MG capsule Take by mouth.     rosuvastatin (CRESTOR) 10 MG tablet      sucralfate (CARAFATE) 1 g tablet Take 1 g by mouth daily.     No current facility-administered medications for this visit.     I,Gabriella Ballesteros,acting as a scribe for Derwood Kaplan, MD.,have documented all relevant documentation on the behalf of Derwood Kaplan, MD,as directed by  Derwood Kaplan, MD while in the presence of Derwood Kaplan, MD.

## 2022-06-23 ENCOUNTER — Other Ambulatory Visit: Payer: Self-pay | Admitting: Oncology

## 2022-06-23 DIAGNOSIS — E876 Hypokalemia: Secondary | ICD-10-CM

## 2022-06-23 MED ORDER — POTASSIUM CHLORIDE CRYS ER 10 MEQ PO TBCR: 10.0000 meq | EXTENDED_RELEASE_TABLET | Freq: Two times a day (BID) | ORAL | 5 refills | Status: DC

## 2022-06-24 ENCOUNTER — Inpatient Hospital Stay: Payer: Medicare Other

## 2022-06-24 ENCOUNTER — Telehealth: Payer: Self-pay

## 2022-06-24 VITALS — BP 98/64 | HR 66 | Resp 20 | Ht 64.0 in | Wt 190.0 lb

## 2022-06-24 DIAGNOSIS — C7A012 Malignant carcinoid tumor of the ileum: Secondary | ICD-10-CM | POA: Diagnosis not present

## 2022-06-24 MED ORDER — OCTREOTIDE ACETATE 20 MG IM KIT
20.0000 mg | PACK | Freq: Once | INTRAMUSCULAR | Status: AC
  Administered 2022-06-24: 20 mg via INTRAMUSCULAR

## 2022-06-24 NOTE — Patient Instructions (Signed)
Octreotide Injection Solution What is this medication? OCTREOTIDE (ok TREE oh tide) treats high levels of growth hormone (acromegaly). It works by reducing the amount of growth hormone your body makes. This reduces symptoms and the risk of health problems caused by too much growth hormone, such as diabetes and heart disease. It may also be used to treat diarrhea caused by neuroendocrine tumors. It works by slowing down the release of serotonin from the tumor cells. This reduces the number of bowel movements you have. This medicine may be used for other purposes; ask your health care provider or pharmacist if you have questions. COMMON BRAND NAME(S): Bynfezia, Sandostatin What should I tell my care team before I take this medication? They need to know if you have any of these conditions: Diabetes Gallbladder disease Kidney disease Liver disease Thyroid disease An unusual or allergic reaction to octreotide, other medications, foods, dyes, or preservatives Pregnant or trying to get pregnant Breast-feeding How should I use this medication? This medication is injected under the skin or into a vein. It is usually given by your care team in a hospital or clinic setting. If you get this medication at home, you will be taught how to prepare and give it. Use exactly as directed. Take it as directed on the prescription label at the same time every day. Keep taking it unless your care team tells you to stop. Allow the injection solution to come to room temperature before use. Do not warm it artificially. It is important that you put your used needles and syringes in a special sharps container. Do not put them in a trash can. If you do not have a sharps container, call your pharmacist or care team to get one. Talk to your care team about the use of this medication in children. Special care may be needed. Overdosage: If you think you have taken too much of this medicine contact a poison control center or  emergency room at once. NOTE: This medicine is only for you. Do not share this medicine with others. What if I miss a dose? If you miss a dose, take it as soon as you can. If it is almost time for your next dose, take only that dose. Do not take double or extra doses. What may interact with this medication? Bromocriptine Certain medications for blood pressure, heart disease, irregular heartbeat Cyclosporine Diuretics Medications for diabetes, including insulin Quinidine This list may not describe all possible interactions. Give your health care provider a list of all the medicines, herbs, non-prescription drugs, or dietary supplements you use. Also tell them if you smoke, drink alcohol, or use illegal drugs. Some items may interact with your medicine. What should I watch for while using this medication? Visit your care team for regular checks on your progress. Tell your care team if your symptoms do not start to get better or if they get worse. To help reduce irritation at the injection site, use a different site for each injection and make sure the solution is at room temperature before use. This medication may cause decreases in blood sugar. Signs of low blood sugar include chills, cool, pale skin or cold sweats, drowsiness, extreme hunger, fast heartbeat, headache, nausea, nervousness or anxiety, shakiness, trembling, unsteadiness, tiredness, or weakness. Contact your care team right away if you experience any of these symptoms. This medication may increase blood sugar. The risk may be higher in patients who already have diabetes. Ask your care team what you can do to lower your   risk of diabetes while taking this medication. You should make sure you get enough vitamin B12 while you are taking this medication. Discuss the foods you eat and the vitamins you take with your care team. What side effects may I notice from receiving this medication? Side effects that you should report to your care  team as soon as possible: Allergic reactions--skin rash, itching, hives, swelling of the face, lips, tongue, or throat Gallbladder problems--severe stomach pain, nausea, vomiting, fever Heart rhythm changes--fast or irregular heartbeat, dizziness, feeling faint or lightheaded, chest pain, trouble breathing High blood sugar (hyperglycemia)--increased thirst or amount of urine, unusual weakness or fatigue, blurry vision Low blood sugar (hypoglycemia)--tremors or shaking, anxiety, sweating, cold or clammy skin, confusion, dizziness, rapid heartbeat Low thyroid levels (hypothyroidism)--unusual weakness or fatigue, increased sensitivity to cold, constipation, hair loss, dry skin, weight gain, feelings of depression Low vitamin B12 level--pain, tingling, or numbness in the hands or feet, muscle weakness, dizziness, confusion, trouble concentrating Pancreatitis--severe stomach pain that spreads to your back or gets worse after eating or when touched, fever, nausea, vomiting Side effects that usually do not require medical attention (report to your care team if they continue or are bothersome): Diarrhea Dizziness Gas Headache Pain, redness, or irritation at injection site Stomach pain This list may not describe all possible side effects. Call your doctor for medical advice about side effects. You may report side effects to FDA at 1-800-FDA-1088. Where should I keep my medication? Keep out of the reach of children and pets. Store in the refrigerator. Protect from light. Allow to come to room temperature naturally. Do not use artificial heat. If protected from light, the injection may be stored between 20 and 30 degrees C (70 and 86 degrees F) for 14 days. After the initial use, throw away any unused portion of a multiple dose vial after 14 days. Get rid of any unused portions of the ampules after use. To get rid of medications that are no longer needed or have expired: Take the medication to a medication  take-back program. Ask your pharmacy or law enforcement to find a location. If you cannot return the medication, ask your pharmacist or care team how to get rid of the medication safely. NOTE: This sheet is a summary. It may not cover all possible information. If you have questions about this medicine, talk to your doctor, pharmacist, or health care provider.  2023 [employer] Standard (2021-07-17 00:00:00)  

## 2022-06-24 NOTE — Telephone Encounter (Signed)
-----   Message from Derwood Kaplan, MD sent at 06/23/2022  2:41 PM EST ----- Regarding: call Tell her labs look good except for low K. She needs 20 meq daily but I can split into 10 bid so they are not quite so big to swallow. Will send in Rx and recheck next month

## 2022-07-02 ENCOUNTER — Encounter: Payer: Self-pay | Admitting: Oncology

## 2022-07-11 ENCOUNTER — Encounter: Payer: Self-pay | Admitting: Oncology

## 2022-07-18 ENCOUNTER — Inpatient Hospital Stay: Payer: Medicare HMO | Attending: Hematology and Oncology

## 2022-07-18 DIAGNOSIS — C7A012 Malignant carcinoid tumor of the ileum: Secondary | ICD-10-CM | POA: Insufficient documentation

## 2022-07-18 DIAGNOSIS — Z79899 Other long term (current) drug therapy: Secondary | ICD-10-CM | POA: Insufficient documentation

## 2022-07-21 ENCOUNTER — Encounter: Payer: Self-pay | Admitting: Oncology

## 2022-07-21 NOTE — Addendum Note (Signed)
Addended by: Juanetta Beets on: 07/21/2022 04:31 PM   Modules accepted: Orders

## 2022-07-22 ENCOUNTER — Inpatient Hospital Stay: Payer: Medicare HMO

## 2022-07-23 ENCOUNTER — Encounter: Payer: Self-pay | Admitting: Oncology

## 2022-07-23 NOTE — Addendum Note (Signed)
Addended by: Juanetta Beets on: 07/23/2022 11:40 AM   Modules accepted: Orders

## 2022-07-24 ENCOUNTER — Inpatient Hospital Stay: Payer: Medicare HMO

## 2022-07-24 VITALS — BP 118/70 | HR 71 | Temp 98.0°F | Resp 18 | Ht 64.0 in | Wt 191.0 lb

## 2022-07-24 DIAGNOSIS — C7A012 Malignant carcinoid tumor of the ileum: Secondary | ICD-10-CM

## 2022-07-24 DIAGNOSIS — Z79899 Other long term (current) drug therapy: Secondary | ICD-10-CM | POA: Diagnosis not present

## 2022-07-24 MED ORDER — OCTREOTIDE ACETATE 20 MG IM KIT
20.0000 mg | PACK | Freq: Once | INTRAMUSCULAR | Status: AC
  Administered 2022-07-24: 20 mg via INTRAMUSCULAR
  Filled 2022-07-24: qty 1

## 2022-07-24 NOTE — Patient Instructions (Signed)
Octreotide Injection Suspension What is this medication? OCTREOTIDE (ok TREE oh tide) treats high levels of growth hormone (acromegaly). It works by reducing the amount of growth hormone your body makes. This reduces symptoms and the risk of health problems caused by too much growth hormone, such as diabetes and heart disease. It may also be used to treat diarrhea caused by neuroendocrine tumors. It works by slowing down the release of serotonin from the tumor cells. This reduces the number of bowel movements you have. This medicine may be used for other purposes; ask your health care provider or pharmacist if you have questions. COMMON BRAND NAME(S): Sandostatin LAR What should I tell my care team before I take this medication? They need to know if you have any of these conditions: Diabetes Gallbladder disease Kidney disease Liver disease Thyroid disease An unusual or allergic reaction to octreotide, other medications, foods, dyes, or preservatives Pregnant or trying to get pregnant Breast-feeding How should I use this medication? This medication is injected into a muscle. It is usually given by your care team in a hospital or clinic setting. Talk to your care team about the use of this medication in children. Special care may be needed. Overdosage: If you think you have taken too much of this medicine contact a poison control center or emergency room at once. NOTE: This medicine is only for you. Do not share this medicine with others. What if I miss a dose? Keep appointments for follow-up doses. It is important not to miss your dose. Call your care team if you are unable to keep an appointment. What may interact with this medication? Do not take this medication with any of the following: Cisapride Dronedarone Flibanserin Lutetium Lu 177 dotatate Pimozide Saquinavir Thioridazine This medication may also interact with the following: Bromocriptine Certain medications for blood  pressure, heart disease, irregular heartbeat Cyclosporine Diuretics Medications for diabetes, including insulin Quinidine This list may not describe all possible interactions. Give your health care provider a list of all the medicines, herbs, non-prescription drugs, or dietary supplements you use. Also tell them if you smoke, drink alcohol, or use illegal drugs. Some items may interact with your medicine. What should I watch for while using this medication? Visit your care team for regular checks on your progress. Tell your care team if your symptoms do not start to get better or if they get worse. This medication may cause decreases in blood sugar. Signs of low blood sugar include chills, cool, pale skin or cold sweats, drowsiness, extreme hunger, fast heartbeat, headache, nausea, nervousness or anxiety, shakiness, trembling, unsteadiness, tiredness, or weakness. Contact your care team right away if you experience any of these symptoms. This medication may increase blood sugar. The risk may be higher in patients who already have diabetes. Ask your care team what you can do to lower your risk of diabetes while taking this medication. You should make sure you get enough vitamin B12 while you are taking this medication. Discuss the foods you eat and the vitamins you take with your care team. What side effects may I notice from receiving this medication? Side effects that you should report to your care team as soon as possible: Allergic reactions--skin rash, itching, hives, swelling of the face, lips, tongue, or throat Gallbladder problems--severe stomach pain, nausea, vomiting, fever Heart rhythm changes--fast or irregular heartbeat, dizziness, feeling faint or lightheaded, chest pain, trouble breathing High blood sugar (hyperglycemia)--increased thirst or amount of urine, unusual weakness or fatigue, blurry vision Low blood   sugar (hypoglycemia)--tremors or shaking, anxiety, sweating, cold or clammy  skin, confusion, dizziness, rapid heartbeat Low thyroid levels (hypothyroidism)--unusual weakness or fatigue, increased sensitivity to cold, constipation, hair loss, dry skin, weight gain, feelings of depression Low vitamin B12 level--pain, tingling, or numbness in the hands or feet, muscle weakness, dizziness, confusion, trouble concentrating Pancreatitis--severe stomach pain that spreads to your back or gets worse after eating or when touched, fever, nausea, vomiting Side effects that usually do not require medical attention (report to your care team if they continue or are bothersome): Diarrhea Dizziness Gas Headache Pain, redness, or irritation at injection site Stomach pain This list may not describe all possible side effects. Call your doctor for medical advice about side effects. You may report side effects to FDA at 1-800-FDA-1088. Where should I keep my medication? This medication is given in a hospital or clinic. It will not be stored at home. NOTE: This sheet is a summary. It may not cover all possible information. If you have questions about this medicine, talk to your doctor, pharmacist, or health care provider.  2023 Elsevier/Gold Standard (2021-04-05 00:00:00)  

## 2022-07-31 ENCOUNTER — Encounter: Payer: Self-pay | Admitting: Oncology

## 2022-08-04 ENCOUNTER — Encounter: Payer: Self-pay | Admitting: Oncology

## 2022-08-15 ENCOUNTER — Inpatient Hospital Stay: Payer: Medicare HMO | Attending: Hematology and Oncology

## 2022-08-15 ENCOUNTER — Encounter: Payer: Self-pay | Admitting: Oncology

## 2022-08-15 DIAGNOSIS — C7A012 Malignant carcinoid tumor of the ileum: Secondary | ICD-10-CM | POA: Diagnosis present

## 2022-08-15 DIAGNOSIS — Z79899 Other long term (current) drug therapy: Secondary | ICD-10-CM | POA: Insufficient documentation

## 2022-08-15 LAB — CMP (CANCER CENTER ONLY)
ALT: 9 U/L (ref 0–44)
AST: 14 U/L — ABNORMAL LOW (ref 15–41)
Albumin: 3.5 g/dL (ref 3.5–5.0)
Alkaline Phosphatase: 100 U/L (ref 38–126)
Anion gap: 9 (ref 5–15)
BUN: 9 mg/dL (ref 8–23)
CO2: 28 mmol/L (ref 22–32)
Calcium: 9.2 mg/dL (ref 8.9–10.3)
Chloride: 100 mmol/L (ref 98–111)
Creatinine: 0.82 mg/dL (ref 0.44–1.00)
GFR, Estimated: >60 mL/min (ref >60)
Glucose, Bld: 101 mg/dL — ABNORMAL HIGH (ref 70–99)
Potassium: 3.5 mmol/L (ref 3.5–5.1)
Sodium: 137 mmol/L (ref 135–145)
Total Bilirubin: <0.1 mg/dL — ABNORMAL LOW (ref 0.3–1.2)
Total Protein: 7.7 g/dL (ref 6.5–8.1)

## 2022-08-19 ENCOUNTER — Inpatient Hospital Stay: Payer: Medicare HMO

## 2022-08-19 VITALS — BP 118/75 | HR 62 | Temp 98.2°F | Resp 18 | Wt 184.1 lb

## 2022-08-19 DIAGNOSIS — C7A012 Malignant carcinoid tumor of the ileum: Secondary | ICD-10-CM | POA: Diagnosis not present

## 2022-08-19 LAB — CHROMOGRANIN A: Chromogranin A (ng/mL): 55.2 ng/mL (ref 0.0–101.8)

## 2022-08-19 MED ORDER — OCTREOTIDE ACETATE 20 MG IM KIT
20.0000 mg | PACK | Freq: Once | INTRAMUSCULAR | Status: AC
  Administered 2022-08-19: 20 mg via INTRAMUSCULAR

## 2022-08-19 NOTE — Patient Instructions (Signed)
Octreotide Injection Solution What is this medication? OCTREOTIDE (ok TREE oh tide) treats high levels of growth hormone (acromegaly). It works by reducing the amount of growth hormone your body makes. This reduces symptoms and the risk of health problems caused by too much growth hormone, such as diabetes and heart disease. It may also be used to treat diarrhea caused by neuroendocrine tumors. It works by slowing down the release of serotonin from the tumor cells. This reduces the number of bowel movements you have. This medicine may be used for other purposes; ask your health care provider or pharmacist if you have questions. COMMON BRAND NAME(S): Bynfezia, Sandostatin What should I tell my care team before I take this medication? They need to know if you have any of these conditions: Diabetes Gallbladder disease Kidney disease Liver disease Thyroid disease An unusual or allergic reaction to octreotide, other medications, foods, dyes, or preservatives Pregnant or trying to get pregnant Breast-feeding How should I use this medication? This medication is injected under the skin or into a vein. It is usually given by your care team in a hospital or clinic setting. If you get this medication at home, you will be taught how to prepare and give it. Use exactly as directed. Take it as directed on the prescription label at the same time every day. Keep taking it unless your care team tells you to stop. Allow the injection solution to come to room temperature before use. Do not warm it artificially. It is important that you put your used needles and syringes in a special sharps container. Do not put them in a trash can. If you do not have a sharps container, call your pharmacist or care team to get one. Talk to your care team about the use of this medication in children. Special care may be needed. Overdosage: If you think you have taken too much of this medicine contact a poison control center or  emergency room at once. NOTE: This medicine is only for you. Do not share this medicine with others. What if I miss a dose? If you miss a dose, take it as soon as you can. If it is almost time for your next dose, take only that dose. Do not take double or extra doses. What may interact with this medication? Bromocriptine Certain medications for blood pressure, heart disease, irregular heartbeat Cyclosporine Diuretics Medications for diabetes, including insulin Quinidine This list may not describe all possible interactions. Give your health care provider a list of all the medicines, herbs, non-prescription drugs, or dietary supplements you use. Also tell them if you smoke, drink alcohol, or use illegal drugs. Some items may interact with your medicine. What should I watch for while using this medication? Visit your care team for regular checks on your progress. Tell your care team if your symptoms do not start to get better or if they get worse. To help reduce irritation at the injection site, use a different site for each injection and make sure the solution is at room temperature before use. This medication may cause decreases in blood sugar. Signs of low blood sugar include chills, cool, pale skin or cold sweats, drowsiness, extreme hunger, fast heartbeat, headache, nausea, nervousness or anxiety, shakiness, trembling, unsteadiness, tiredness, or weakness. Contact your care team right away if you experience any of these symptoms. This medication may increase blood sugar. The risk may be higher in patients who already have diabetes. Ask your care team what you can do to lower your   risk of diabetes while taking this medication. You should make sure you get enough vitamin B12 while you are taking this medication. Discuss the foods you eat and the vitamins you take with your care team. What side effects may I notice from receiving this medication? Side effects that you should report to your care  team as soon as possible: Allergic reactions--skin rash, itching, hives, swelling of the face, lips, tongue, or throat Gallbladder problems--severe stomach pain, nausea, vomiting, fever Heart rhythm changes--fast or irregular heartbeat, dizziness, feeling faint or lightheaded, chest pain, trouble breathing High blood sugar (hyperglycemia)--increased thirst or amount of urine, unusual weakness or fatigue, blurry vision Low blood sugar (hypoglycemia)--tremors or shaking, anxiety, sweating, cold or clammy skin, confusion, dizziness, rapid heartbeat Low thyroid levels (hypothyroidism)--unusual weakness or fatigue, increased sensitivity to cold, constipation, hair loss, dry skin, weight gain, feelings of depression Low vitamin B12 level--pain, tingling, or numbness in the hands or feet, muscle weakness, dizziness, confusion, trouble concentrating Pancreatitis--severe stomach pain that spreads to your back or gets worse after eating or when touched, fever, nausea, vomiting Side effects that usually do not require medical attention (report to your care team if they continue or are bothersome): Diarrhea Dizziness Gas Headache Pain, redness, or irritation at injection site Stomach pain This list may not describe all possible side effects. Call your doctor for medical advice about side effects. You may report side effects to FDA at 1-800-FDA-1088. Where should I keep my medication? Keep out of the reach of children and pets. Store in the refrigerator. Protect from light. Allow to come to room temperature naturally. Do not use artificial heat. If protected from light, the injection may be stored between 20 and 30 degrees C (70 and 86 degrees F) for 14 days. After the initial use, throw away any unused portion of a multiple dose vial after 14 days. Get rid of any unused portions of the ampules after use. To get rid of medications that are no longer needed or have expired: Take the medication to a medication  take-back program. Ask your pharmacy or law enforcement to find a location. If you cannot return the medication, ask your pharmacist or care team how to get rid of the medication safely. NOTE: This sheet is a summary. It may not cover all possible information. If you have questions about this medicine, talk to your doctor, pharmacist, or health care provider.  2023 [employer] Standard (2021-07-17 00:00:00)  

## 2022-09-12 NOTE — Progress Notes (Signed)
[facility]  [address] [place],  [place]  [address] [phone]  Clinic Day: 09/17/22  Referring physician: Galvin Proffer, MD  ASSESSMENT & PLAN:  Assessment & Plan: 1.  History of neuroendocrine/carcinoid tumor of the ascending colon, diagnosed in October 2016, treated with surgical resection.  Seven of eighteen lymph nodes were involved (7/18).  Primary tumor measured 2.2 x 1.7 x 1.5 cm, with three apparent vascular metastases including a nodule up to 3 cm with metastatic tumor present within 1 mm of the inked radial margin.  In the distal appendix there was also a separate 3 mm carcinoid tumor.  This was a staged as a pT3pN1 with low mitotic rate (0 per 10 high powered fields).  [name] was sporadic.  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  24 hour urine for 5HIAA was normal.  Chromogranin A from February was elevated at 262.3, but CT imaging was stable with no evidence of residual or recurrent disease.  She was placed on monthly octreotide injections in March 2022 with good response based on her symptoms and her decreasing chromogranin A, last measuring 48.8 in August.  CT imaging from August remains without evidence of recurrent or metastatic disease.   2.  Nausea and regurgitation. No clear etiology has been found. Dr.Butler has seen her multiple times in the past.  3.  Paresthesias and numbness of both lower extremities for at least the last 6 months. She is unable to ambulate. She has severe degenerative disk disease.  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 She was on a higher dose of gabapentin but felt this wasn't helping and so is down to 300 mg at bedtime.   4.  We searched for any central nervous system etiology for her problems and got an MRI of the brain which does show chronic ischemic changes.  It could be  related to her marijuana usage as well as her depression.   Plan:  I reiterated that she does have degenerative disc disease that can be causing the man pain of her back and legs, as she does not recall our previous conversations about this.  I explained this to her family member as well. She is scheduled to receive her Sandostatin injection after her appointment with me today. Her labs today are pending. I will schedule a CT abdomen and pelvis and call her with the results. I will see her back on June 13th to review her CT results and reevaluation with CBC and CMP and she will have her next injection the following week.  If she needs to see [place] sooner she knows to call.  She verbalizes understanding of and agreement to the plans discussed today. She knows to call the office should any new questions or concerns arise.  I provided 18 minutes of face-to-face time during this this encounter and > 50% was spent counseling as documented under my assessment and plan.     Kristi Beckwith, MD  [facility] AT [facility] [address] [place] [address] Dept: [phone] Dept Fax: [phone]   No orders of the defined types were placed in this encounter.      CHIEF COMPLAINT:  CC: Carcinoid syndrome  Current Treatment: Octreotide every 4 weeks   HISTORY OF PRESENT ILLNESS:  Kristi Romero is  a 67 year old. female referred by Dr. Charm Barges for a transfer of care and further management of her history of neuroendocrine and carcinoid tumor, which was diagnosed in October 2016.  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  She underwent surgical resection with Dr. Jeanell Sparrow. Pathology confirmed  well differentiated neuroendocrine tumor consistent with carcinoid tumor.  Seven of eighteen lymph nodes were involved (7/18).  Primary tumor measured 2.2 x 1.7 x 1.5 cm, with three apparent vascular metastases including a nodule up to 3 cm with metastatic tumor present within 1 mm of the inked radial margin.  In the distal appendix there was also a separate 3 mm carcinoid tumor.  This was a staged as a pT3pN1 with low mitotic rate (0 per 10 high powered fields).  She did require a PEG tube in 2017 for failure to thrive and this was removed in January 2018.  Her weight was up to 157 by September 2021.   Her last follow up was in January 2020 with Dr. Daleen Squibb until we started seeing her in early February 2022.  Kristi Romero complained of weight loss, stomach pain and nausea.  She was evaluated by Dr. Charm Barges with endoscopy in November 2021 which revealed chronic gastritis and was negative for H.Pylori.  No evidence of malignancy was observed.  She also notes bilateral numbness "from her mouth to her feet".   She receives B12 injections every 6 months.  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  Chromogranin A from February was elevated at 262.3. She also had an elevated serum protein. Serum protein electrophoresis did not reveal a monoclonal spike. CT imaging revealed adrenal adenomas but no obvious evidence of recurrent/progressive disease.  She was started on octreotide injection in March 2022 with good response based on her decreasing chromogranin A.  CT chest, abdomen and pelvis in August revealed postoperative changes of right hemicolectomy with enterocolonic anastomosis without evidence of local recurrence or abdominopelvic metastatic disease.  Stable left adrenal gland adenoma.  MRI scan of the brain in August revealed evidence of chronic ischemia. She had normalization of the Chromogranin A in April. Chromogranin A from August was 48.8 down from 61.4 in April. The  chromogranin A was 51 in September.  She continues octreotide every 4 weeks.   INTERVAL HISTORY:  Kristi Romero is here today for repeat clinical assessment for carcinoid syndrome. Patient states that she doesn't feel well and complains of pain in her epigastric area worse in the mornings, occasional nausea, numbness in her hands, arms, and legs, and swelling from her stomach down to her legs. I reiterated that she does have degenerative disc that can be causing the man pain of her back and legs as she doesn't recall our previous conversations. I reviewed this information with her granddaughter as well.  She is scheduled to receive her Sandostatin injection after her appointment with me today. Her labs today are pending. I will schedule a CT abdomen and pelvis and call her with the results. I will see her back on June 13th to review her CT results and reevaluation. She denies signs of infection such as sore throat, sinus drainage, cough, or urinary symptoms.  She denies fevers or recurrent chills. She denies pain. She denies nausea,  vomiting, chest pain, dyspnea or cough. Her appetite is fair and her weight has increased 4 pounds over last month . This patient is accompanied in the office by her  granddaughter .   REVIEW OF SYSTEMS:  Review of Systems  Constitutional:  Positive for appetite change (on/off). Negative for chills, diaphoresis, fatigue, fever and unexpected weight change.  HENT:  Negative.  Negative for hearing loss, lump/mass, mouth sores, nosebleeds, sore throat, tinnitus, trouble swallowing and voice change.   Eyes: Negative.  Negative for eye problems and icterus.  Respiratory: Negative.  Negative for chest tightness, cough, hemoptysis, shortness of breath and wheezing.   Cardiovascular:  Positive for leg swelling (mild). Negative for chest pain and palpitations.  Gastrointestinal:  Positive for abdominal pain (epigastic area) and nausea. Negative for abdominal distention, blood in stool,  constipation, diarrhea, rectal pain and vomiting.  Endocrine: Negative.   Genitourinary: Negative.  Negative for bladder incontinence, difficulty urinating, dyspareunia, dysuria, frequency, hematuria, menstrual problem, nocturia, pelvic pain, vaginal bleeding and vaginal discharge.   Musculoskeletal:  Positive for gait problem (in a wheelchair due to pain with ambulation) and neck pain (occasional). Negative for arthralgias, back pain, flank pain, myalgias and neck stiffness.  Skin: Negative.  Negative for itching, rash and wound.  Neurological:  Positive for gait problem (in a wheelchair due to pain with ambulation) and numbness (hands and arms and paresthesias of the bilateral legs). Negative for dizziness, extremity weakness, headaches, light-headedness, seizures and speech difficulty.  Hematological: Negative.  Negative for adenopathy. Does not bruise/bleed easily.  Psychiatric/Behavioral: Negative.  Negative for confusion, decreased concentration, depression, sleep disturbance and suicidal ideas. The patient is not nervous/anxious.      VITALS:  Blood pressure 124/76, pulse (!) 55, temperature 97.6 F (36.4 C), temperature source Oral, resp. rate 19, height 5\' 4"  (1.626 m), weight 188 lb 8 oz (85.5 kg), SpO2 96 %.  Wt Readings from Last 3 Encounters:  09/17/22 188 lb (85.3 kg)  09/17/22 188 lb 8 oz (85.5 kg)  08/19/22 184 lb 1.9 oz (83.5 kg)    Body mass index is 32.36 kg/m.  Performance status (ECOG): 2 - Symptomatic, <50% confined to bed  PHYSICAL EXAM:  Physical Exam Vitals and nursing note reviewed.  Constitutional:      General: She is not in acute distress.    Appearance: Normal appearance. She is normal weight. She is not ill-appearing, toxic-appearing or diaphoretic.  HENT:     Head: Normocephalic and atraumatic.     Right Ear: Tympanic membrane, ear canal and external ear normal. There is no impacted cerumen.     Left Ear: Tympanic membrane, ear canal and external ear  normal. There is no impacted cerumen.     Nose: Nose normal. No congestion or rhinorrhea.     Mouth/Throat:     Mouth: Mucous membranes are moist.     Pharynx: Oropharynx is clear. No oropharyngeal exudate or posterior oropharyngeal erythema.  Eyes:     General: No scleral icterus.       Right eye: No discharge.        Left eye: No discharge.     Extraocular Movements: Extraocular movements intact.     Conjunctiva/sclera: Conjunctivae normal.     Pupils: Pupils are equal, round, and reactive to light.  Neck:     Vascular: No carotid bruit.  Cardiovascular:     Rate and Rhythm: Normal rate and regular rhythm.     Pulses: Normal pulses.     Heart sounds:  Normal heart sounds. No murmur heard.    No friction rub. No gallop.  Pulmonary:     Effort: Pulmonary effort is normal. No respiratory distress.     Breath sounds: Normal breath sounds. No stridor. No wheezing, rhonchi or rales.  Chest:     Chest wall: No tenderness.  Abdominal:     General: Bowel sounds are normal. There is no distension.     Palpations: Abdomen is soft. There is no hepatomegaly, splenomegaly or mass.     Tenderness: There is abdominal tenderness in the epigastric area. There is no right CVA tenderness, left CVA tenderness, guarding or rebound.     Hernia: No hernia is present.  Musculoskeletal:        General: No swelling, tenderness, deformity or signs of injury. Normal range of motion.     Cervical back: Normal range of motion and neck supple. No rigidity or tenderness.     Right lower leg: Edema (mild) present.     Left lower leg: Edema (mild) present.  Lymphadenopathy:     Cervical: No cervical adenopathy.  Skin:    General: Skin is warm and dry.     Coloration: Skin is not jaundiced or pale.     Findings: No bruising, erythema, lesion or rash.  Neurological:     General: No focal deficit present.     Mental Status: She is alert and oriented to person, place, and time. Mental status is at baseline.      Cranial Nerves: No cranial nerve deficit.     Sensory: No sensory deficit.     Motor: No weakness.     Coordination: Coordination normal.     Gait: Gait normal.     Deep Tendon Reflexes: Reflexes normal.  Psychiatric:        Mood and Affect: Mood normal.        Behavior: Behavior normal.        Thought Content: Thought content normal.        Judgment: Judgment normal.     LABS:   omponent Ref Range & Units 08/27/2022  WBC 4.40 - 11.00 10*3/uL 9.30  RBC 4.10 - 5.10 10*6/uL 3.92 Low   Hemoglobin 12.3 - 15.3 g/dL 16.1 Low   Hematocrit 09.6 - 44.6 % 36.6  Mean Corpuscular Volume (MCV) 80.0 - 96.0 fL 93.3  Mean Corpuscular Hemoglobin (MCH) 27.5 - 33.2 pg 31.0  Mean Corpuscular Hemoglobin Conc (MCHC) 33.0 - 37.0 g/dL 04.5  Red Cell Distribution Width (RDW) 12.3 - 17.0 % 13.5  Platelet Count (PLT) 150 - 450 10*3/uL 343   Component Ref Range & Units 08/27/2022  Sodium 136 - 145 mmol/L 141  Potassium 3.4 - 4.5 mmol/L 3.5  Chloride 98 - 107 mmol/L 105  CO2 21 - 31 mmol/L 30  Anion Gap 6 - 14 mmol/L 6  Glucose, Random 70 - 99 mg/dL 409 High   Blood Urea Nitrogen (BUN) 7 - 25 mg/dL 10  Creatinine 8.11 - 9.14 mg/dL 7.82  eGFR >95 AO/ZHY/8.65H8 89  Albumin 3.5 - 5.7 g/dL 3.5  Total Protein 6.4 - 8.9 g/dL 7.0  Bilirubin, Total 0.3 - 1.0 mg/dL 0.2 Low   Alkaline Phosphatase (ALP) 34 - 104 U/L 109 High   Aspartate Aminotransferase (AST) 13 - 39 U/L 13  Alanine Aminotransferase (ALT) 7 - 52 U/L 9  Calcium 8.6 - 10.3 mg/dL 9.4   Component Ref Range & Units 4 wk ago (08/15/22) 3 mo ago (05/22/22) 4 mo ago (04/24/22)  Chromogranin A (ng/mL) 0.0 - 101.8 ng/mL 55.2 37.1 CM 37.8 CM      Latest Ref Rng & Units 09/17/2022   10:24 AM 06/19/2022    3:16 PM 05/22/2022    2:23 PM  CBC  WBC 4.0 - 10.5 K/uL 8.7  8.8  10.4   Hemoglobin 12.0 - 15.0 g/dL 16.1  09.6  04.5   Hematocrit 36.0 - 46.0 % 40.5  41.6  41.3   Platelets 150 - 400 K/uL 300  255  277        Latest Ref Rng & Units 09/17/2022   10:24 AM 08/15/2022   10:34 AM 06/19/2022    3:16 PM  CMP  Glucose 70 - 99 mg/dL 98  409  811   BUN 8 - 23 mg/dL 10  9  9    Creatinine 0.44 - 1.00 mg/dL 9.14  7.82  9.56   Sodium 135 - 145 mmol/L 139  137  138   Potassium 3.5 - 5.1 mmol/L 3.3  3.5  3.2   Chloride 98 - 111 mmol/L 100  100  101   CO2 22 - 32 mmol/L 31  28  27    Calcium 8.9 - 10.3 mg/dL 9.2  9.2  9.1   Total Protein 6.5 - 8.1 g/dL 8.0  7.7  7.5   Total Bilirubin 0.3 - 1.2 mg/dL 0.3  <2.1  0.3   Alkaline Phos 38 - 126 U/L 86  100  98   AST 15 - 41 U/L 15  14  17    ALT 0 - 44 U/L 11  9  15      Lab Results  Component Value Date   TOTALPROTELP 6.6 06/01/2020   ALBUMINELP 3.1 06/01/2020   A1GS 0.3 06/01/2020   A2GS 0.8 06/01/2020   BETS 1.1 06/01/2020   GAMS 1.4 06/01/2020   MSPIKE Not Observed 06/01/2020   SPEI Comment 06/01/2020    STUDIES:      HISTORY:   Allergies:  Allergies  Allergen Reactions   Atorvastatin Anaphylaxis   Codeine Nausea And Vomiting, Nausea Only and Shortness Of Breath    Other reaction(s): GI Upset (intolerance)  Other Reaction(s): upset stomach   Lisinopril Anaphylaxis, Swelling and Other (See Comments)    Other Reaction(s): tongue swells up    Current Medications: Current Outpatient Medications  Medication Sig Dispense Refill   acetaminophen (TYLENOL) 500 MG tablet Take 500 mg by mouth every 6 (six) hours as needed for mild pain.     apixaban (ELIQUIS) 5 MG TABS tablet Take 1 tablet (5 mg total) by mouth 2 (two) times daily. 60 tablet 0   baclofen (LIORESAL) 10 MG tablet Take 10 mg by mouth 2 (two) times daily.     ciprofloxacin (CILOXAN) 0.3 % ophthalmic ointment Place into both eyes 3 (three) times daily. 3.5 g 0   dicyclomine (BENTYL) 20 MG tablet Take 20 mg by mouth 4 (four) times daily as needed for spasms.     DULoxetine (CYMBALTA) 60 MG capsule Take 60 mg by mouth daily.     gabapentin (NEURONTIN) 600 MG tablet Take 600 mg by mouth 3  (three) times daily. Per patient     LINZESS 290 MCG CAPS capsule Take 290 mcg by mouth daily.     meclizine (ANTIVERT) 25 MG tablet Take 25 mg by mouth 4 (four) times daily as needed for dizziness.     mirtazapine (REMERON) 15 MG tablet TAKE 1 TABLET AT BEDTIME 90 tablet 3   ondansetron (  ZOFRAN) 4 MG tablet Take 1 tablet (4 mg total) by mouth every 6 (six) hours. 12 tablet 0   ondansetron (ZOFRAN-ODT) 4 MG disintegrating tablet Take 4 mg by mouth 2 (two) times daily as needed for nausea or vomiting.     oxycodone (OXY-IR) 5 MG capsule Take 5 mg by mouth every 6 (six) hours as needed for pain.     pantoprazole (PROTONIX) 40 MG tablet Take 40 mg by mouth daily.     potassium chloride (KLOR-CON M) 10 MEQ tablet Take 1 tablet (10 mEq total) by mouth 2 (two) times daily. 60 tablet 5   pregabalin (LYRICA) 50 MG capsule Take by mouth.     rosuvastatin (CRESTOR) 10 MG tablet      sucralfate (CARAFATE) 1 g tablet Take 1 g by mouth daily.     No current facility-administered medications for this visit.     I,Jasmine M Lassiter,acting as a scribe for Kristi Beckwith, MD.,have documented all relevant documentation on the behalf of Kristi Beckwith, MD,as directed by  Kristi Beckwith, MD while in the presence of Kristi Beckwith, MD.

## 2022-09-17 ENCOUNTER — Inpatient Hospital Stay: Payer: Medicare HMO

## 2022-09-17 ENCOUNTER — Encounter: Payer: Self-pay | Admitting: Oncology

## 2022-09-17 ENCOUNTER — Other Ambulatory Visit: Payer: Self-pay | Admitting: Oncology

## 2022-09-17 ENCOUNTER — Inpatient Hospital Stay (INDEPENDENT_AMBULATORY_CARE_PROVIDER_SITE_OTHER): Payer: Medicare HMO | Admitting: Oncology

## 2022-09-17 ENCOUNTER — Inpatient Hospital Stay: Payer: Medicare HMO | Attending: Hematology and Oncology

## 2022-09-17 VITALS — BP 138/86 | HR 62 | Resp 14 | Ht 64.0 in | Wt 188.0 lb

## 2022-09-17 VITALS — BP 124/76 | HR 55 | Temp 97.6°F | Resp 19 | Ht 64.0 in | Wt 188.5 lb

## 2022-09-17 DIAGNOSIS — C7A012 Malignant carcinoid tumor of the ileum: Secondary | ICD-10-CM

## 2022-09-17 DIAGNOSIS — Z79899 Other long term (current) drug therapy: Secondary | ICD-10-CM | POA: Diagnosis not present

## 2022-09-17 LAB — CMP (CANCER CENTER ONLY)
ALT: 11 U/L (ref 0–44)
AST: 15 U/L (ref 15–41)
Albumin: 3.6 g/dL (ref 3.5–5.0)
Alkaline Phosphatase: 86 U/L (ref 38–126)
Anion gap: 8 (ref 5–15)
BUN: 10 mg/dL (ref 8–23)
CO2: 31 mmol/L (ref 22–32)
Calcium: 9.2 mg/dL (ref 8.9–10.3)
Chloride: 100 mmol/L (ref 98–111)
Creatinine: 0.86 mg/dL (ref 0.44–1.00)
GFR, Estimated: >60 mL/min (ref >60)
Glucose, Bld: 98 mg/dL (ref 70–99)
Potassium: 3.3 mmol/L — ABNORMAL LOW (ref 3.5–5.1)
Sodium: 139 mmol/L (ref 135–145)
Total Bilirubin: 0.3 mg/dL (ref 0.3–1.2)
Total Protein: 8 g/dL (ref 6.5–8.1)

## 2022-09-17 LAB — CBC WITH DIFFERENTIAL (CANCER CENTER ONLY)
Abs Immature Granulocytes: 0.02 10*3/uL (ref 0.00–0.07)
Basophils Absolute: 0 10*3/uL (ref 0.0–0.1)
Basophils Relative: 0 %
Eosinophils Absolute: 0.1 10*3/uL (ref 0.0–0.5)
Eosinophils Relative: 1 %
HCT: 40.5 % (ref 36.0–46.0)
Hemoglobin: 12.8 g/dL (ref 12.0–15.0)
Immature Granulocytes: 0 %
Lymphocytes Relative: 50 %
Lymphs Abs: 4.4 10*3/uL — ABNORMAL HIGH (ref 0.7–4.0)
MCH: 31.2 pg (ref 26.0–34.0)
MCHC: 31.6 g/dL (ref 30.0–36.0)
MCV: 98.8 fL (ref 80.0–100.0)
Monocytes Absolute: 0.7 10*3/uL (ref 0.1–1.0)
Monocytes Relative: 8 %
Neutro Abs: 3.5 10*3/uL (ref 1.7–7.7)
Neutrophils Relative %: 41 %
Platelet Count: 300 10*3/uL (ref 150–400)
RBC: 4.1 MIL/uL (ref 3.87–5.11)
RDW: 13.2 % (ref 11.5–15.5)
WBC Count: 8.7 10*3/uL (ref 4.0–10.5)
nRBC: 0 % (ref 0.0–0.2)

## 2022-09-17 MED ORDER — OCTREOTIDE ACETATE 20 MG IM KIT
20.0000 mg | PACK | Freq: Once | INTRAMUSCULAR | Status: AC
  Administered 2022-09-17: 20 mg via INTRAMUSCULAR
  Filled 2022-09-17: qty 1

## 2022-09-18 ENCOUNTER — Telehealth: Payer: Self-pay | Admitting: Hematology and Oncology

## 2022-09-18 NOTE — Telephone Encounter (Signed)
09/18/22 Spoke with patient and scheduled CT SCAN on 09/25/22@130pm .Arrive at [employer]

## 2022-09-26 ENCOUNTER — Telehealth: Payer: Self-pay

## 2022-09-26 NOTE — Telephone Encounter (Signed)
-----   Message from [name], MD sent at 09/24/2022  7:53 PM EDT ----- Regarding: call Tell her all of the labs look good except for low potassium.  If she taking the supplement twice daily?  If not, she needs to do so.  If she is taking then she needs to increase to 3 times daily.( 10 meq)

## 2022-10-02 ENCOUNTER — Encounter: Payer: Self-pay | Admitting: Oncology

## 2022-10-08 ENCOUNTER — Encounter: Payer: Self-pay | Admitting: Hematology and Oncology

## 2022-10-09 ENCOUNTER — Other Ambulatory Visit: Payer: Self-pay | Admitting: Oncology

## 2022-10-09 ENCOUNTER — Inpatient Hospital Stay: Payer: Medicare HMO | Admitting: Oncology

## 2022-10-09 ENCOUNTER — Inpatient Hospital Stay: Payer: Medicare HMO

## 2022-10-09 ENCOUNTER — Other Ambulatory Visit: Payer: Medicare HMO

## 2022-10-09 DIAGNOSIS — C7A012 Malignant carcinoid tumor of the ileum: Secondary | ICD-10-CM

## 2022-10-10 ENCOUNTER — Inpatient Hospital Stay: Payer: Medicare HMO | Admitting: Oncology

## 2022-10-10 ENCOUNTER — Inpatient Hospital Stay: Payer: Medicare HMO

## 2022-10-10 NOTE — Progress Notes (Incomplete)
[facility]  [address] [place],  [place]  [address] [phone]  Clinic Day: 10/10/22   Referring physician: Galvin Proffer, MD  ASSESSMENT & PLAN:  Assessment & Plan: 1.  History of neuroendocrine/carcinoid tumor of the ascending colon, diagnosed in October 2016, treated with surgical resection.  Seven of eighteen lymph nodes were involved (7/18).  Primary tumor measured 2.2 x 1.7 x 1.5 cm, with three apparent vascular metastases including a nodule up to 3 cm with metastatic tumor present within 1 mm of the inked radial margin.  In the distal appendix there was also a separate 3 mm carcinoid tumor.  This was a staged as a pT3pN1 with low mitotic rate (0 per 10 high powered fields).  [name] was sporadic.  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  24 hour urine for 5HIAA was normal.  Chromogranin A from February was elevated at 262.3, but CT imaging was stable with no evidence of residual or recurrent disease.  She was placed on monthly octreotide injections in March 2022 with good response based on her symptoms and her decreasing chromogranin A, last measuring 48.8 in August.  CT imaging from August remains without evidence of recurrent or metastatic disease.   2.  Nausea and regurgitation. No clear etiology has been found. KristiButler has seen her multiple times in the past.  3.  Paresthesias and numbness of both lower extremities for at least the last 6 months. She is unable to ambulate. She has severe degenerative disk disease.  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 She was on a higher dose of gabapentin but felt this wasn't helping and so is down to 300 mg at bedtime.   4.  We searched for any central nervous system etiology for her problems and got an MRI of the brain which does show chronic ischemic changes.  It could be  related to her marijuana usage as well as her depression.   Plan:  I reiterated that she does have degenerative disc disease that can be causing the man pain of her back and legs, as she does not recall our previous conversations about this.  I explained this to her family member as well. She is scheduled to receive her Sandostatin injection after her appointment with me today. Her labs today are pending. I will schedule a CT abdomen and pelvis and call her with the results. I will see her back on June 13th to review her CT results and reevaluation with CBC and CMP and she will have her next injection the following week.  If she needs to see [place] sooner she knows to call.  She verbalizes understanding of and agreement to the plans discussed today. She knows to call the office should any new questions or concerns arise.  I provided 18 minutes of face-to-face time during this this encounter and > 50% was spent counseling as documented under my assessment and plan.     Kristi Romero AT [facility] [address] [place] [address] Dept: [phone] Dept Fax: [phone]   No orders of the defined types were placed in this encounter.      CHIEF COMPLAINT:  CC: Carcinoid syndrome  Current Treatment: Octreotide every 4 weeks   HISTORY OF PRESENT ILLNESS:  Kristi Romero is a  67 year old. female referred by Kristi Romero for a transfer of care and further management of her history of neuroendocrine and carcinoid tumor, which was diagnosed in October 2016.  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  She underwent surgical resection with Dr. Jeanell Romero. Pathology confirmed well  differentiated neuroendocrine tumor consistent with carcinoid tumor.  Seven of eighteen lymph nodes were involved (7/18).  Primary tumor measured 2.2 x 1.7 x 1.5 cm, with three apparent vascular metastases including a nodule up to 3 cm with metastatic tumor present within 1 mm of the inked radial margin.  In the distal appendix there was also a separate 3 mm carcinoid tumor.  This was a staged as a pT3pN1 with low mitotic rate (0 per 10 high powered fields).  She did require a PEG tube in 2017 for failure to thrive and this was removed in January 2018.  Her weight was up to 157 by September 2021.   Her last follow up was in January 2020 with Dr. Daleen Squibb until we started seeing her in early February 2022.  [name] complained of weight loss, stomach pain and nausea.  She was evaluated by Kristi Romero with endoscopy in November 2021 which revealed chronic gastritis and was negative for H.Pylori.  No evidence of malignancy was observed.  She also notes bilateral numbness "from her mouth to her feet".   She receives B12 injections every 6 months.  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  Chromogranin A from February was elevated at 262.3. She also had an elevated serum protein. Serum protein electrophoresis did not reveal a monoclonal spike. CT imaging revealed adrenal adenomas but no obvious evidence of recurrent/progressive disease.  She was started on octreotide injection in March 2022 with good response based on her decreasing chromogranin A.  CT chest, abdomen and pelvis in August revealed postoperative changes of right hemicolectomy with enterocolonic anastomosis without evidence of local recurrence or abdominopelvic metastatic disease.  Stable left adrenal gland adenoma.  MRI scan of the brain in August revealed evidence of chronic ischemia. She had normalization of the Chromogranin A in April. Chromogranin A from August was 48.8 down from 61.4 in April. The  chromogranin A was 51 in September.  She continues octreotide every 4 weeks.   INTERVAL HISTORY:  Kristi Romero is here today for repeat clinical assessment for carcinoid syndrome. Patient states that she feels *** and ***  She  denies signs of infections such as sore throat, sinus drainage, cough or urinary symptoms. She  denies fever or recurrent chills. She  also deny nausea, vomiting, chest pain dyspnea or cough. Her  appetite is *** and Her  weight ***     doesn't feel well and complains of pain in her epigastric area worse in the mornings, occasional nausea, numbness in her hands, arms, and legs, and swelling from her stomach down to her legs. I reiterated that she does have degenerative disc that can be causing the man pain of her back and legs as she doesn't recall our previous conversations. I reviewed this information with her granddaughter as well.  She is scheduled to receive her Sandostatin injection after her appointment with me today. Her labs today are pending. I will schedule a CT  abdomen and pelvis and call her with the results. I will see her back on June 13th to review her CT results and reevaluation. She denies signs of infection such as sore throat, sinus drainage, cough, or urinary symptoms.  She denies fevers or recurrent chills. She denies pain. She denies nausea, vomiting, chest pain, dyspnea or cough. Her appetite is fair and her weight has increased 4 pounds over last month . This patient is accompanied in the office by her  granddaughter .   REVIEW OF SYSTEMS:  Review of Systems  Constitutional:  Positive for appetite change (on/off). Negative for chills, diaphoresis, fatigue, fever and unexpected weight change.  HENT:  Negative.  Negative for hearing loss, lump/mass, mouth sores, nosebleeds, sore throat, tinnitus, trouble swallowing and voice change.   Eyes: Negative.  Negative for eye problems and icterus.  Respiratory: Negative.  Negative for chest tightness, cough,  hemoptysis, shortness of breath and wheezing.   Cardiovascular:  Positive for leg swelling (mild). Negative for chest pain and palpitations.  Gastrointestinal:  Positive for abdominal pain (epigastic area) and nausea. Negative for abdominal distention, blood in stool, constipation, diarrhea, rectal pain and vomiting.  Endocrine: Negative.   Genitourinary: Negative.  Negative for bladder incontinence, difficulty urinating, dyspareunia, dysuria, frequency, hematuria, menstrual problem, nocturia, pelvic pain, vaginal bleeding and vaginal discharge.   Musculoskeletal:  Positive for gait problem (in a wheelchair due to pain with ambulation) and neck pain (occasional). Negative for arthralgias, back pain, flank pain, myalgias and neck stiffness.  Skin: Negative.  Negative for itching, rash and wound.  Neurological:  Positive for gait problem (in a wheelchair due to pain with ambulation) and numbness (hands and arms and paresthesias of the bilateral legs). Negative for dizziness, extremity weakness, headaches, light-headedness, seizures and speech difficulty.  Hematological: Negative.  Negative for adenopathy. Does not bruise/bleed easily.  Psychiatric/Behavioral: Negative.  Negative for confusion, decreased concentration, depression, sleep disturbance and suicidal ideas. The patient is not nervous/anxious.      VITALS:  There were no vitals taken for this visit.  Wt Readings from Last 3 Encounters:  09/17/22 188 lb (85.3 kg)  09/17/22 188 lb 8 oz (85.5 kg)  08/19/22 184 lb 1.9 oz (83.5 kg)    There is no height or weight on file to calculate BMI.  Performance status (ECOG): 2 - Symptomatic, <50% confined to bed  PHYSICAL EXAM:  Physical Exam Vitals and nursing note reviewed.  Constitutional:      General: She is not in acute distress.    Appearance: Normal appearance. She is normal weight. She is not ill-appearing, toxic-appearing or diaphoretic.  HENT:     Head: Normocephalic and atraumatic.      Right Ear: Tympanic membrane, ear canal and external ear normal. There is no impacted cerumen.     Left Ear: Tympanic membrane, ear canal and external ear normal. There is no impacted cerumen.     Nose: Nose normal. No congestion or rhinorrhea.     Mouth/Throat:     Mouth: Mucous membranes are moist.     Pharynx: Oropharynx is clear. No oropharyngeal exudate or posterior oropharyngeal erythema.  Eyes:     General: No scleral icterus.       Right eye: No discharge.        Left eye: No discharge.     Extraocular Movements: Extraocular movements intact.     Conjunctiva/sclera: Conjunctivae normal.     Pupils: Pupils are equal, round, and reactive to light.  Neck:  Vascular: No carotid bruit.  Cardiovascular:     Rate and Rhythm: Normal rate and regular rhythm.     Pulses: Normal pulses.     Heart sounds: Normal heart sounds. No murmur heard.    No friction rub. No gallop.  Pulmonary:     Effort: Pulmonary effort is normal. No respiratory distress.     Breath sounds: Normal breath sounds. No stridor. No wheezing, rhonchi or rales.  Chest:     Chest wall: No tenderness.  Abdominal:     General: Bowel sounds are normal. There is no distension.     Palpations: Abdomen is soft. There is no hepatomegaly, splenomegaly or mass.     Tenderness: There is abdominal tenderness in the epigastric area. There is no right CVA tenderness, left CVA tenderness, guarding or rebound.     Hernia: No hernia is present.  Musculoskeletal:        General: No swelling, tenderness, deformity or signs of injury. Normal range of motion.     Cervical back: Normal range of motion and neck supple. No rigidity or tenderness.     Right lower leg: Edema (mild) present.     Left lower leg: Edema (mild) present.  Lymphadenopathy:     Cervical: No cervical adenopathy.  Skin:    General: Skin is warm and dry.     Coloration: Skin is not jaundiced or pale.     Findings: No bruising, erythema, lesion or rash.   Neurological:     General: No focal deficit present.     Mental Status: She is alert and oriented to person, place, and time. Mental status is at baseline.     Cranial Nerves: No cranial nerve deficit.     Sensory: No sensory deficit.     Motor: No weakness.     Coordination: Coordination normal.     Gait: Gait normal.     Deep Tendon Reflexes: Reflexes normal.  Psychiatric:        Mood and Affect: Mood normal.        Behavior: Behavior normal.        Thought Content: Thought content normal.        Judgment: Judgment normal.     LABS:   Component Ref Range & Units 10/03/2022  WBC 4.40 - 11.00 10*3/uL 8.90  RBC 4.10 - 5.10 10*6/uL 4.28  Hemoglobin 12.3 - 15.3 g/dL 14.7  Hematocrit 82.9 - 44.6 % 39.7  Mean Corpuscular Volume (MCV) 80.0 - 96.0 fL 92.6  Mean Corpuscular Hemoglobin (MCH) 27.5 - 33.2 pg 31.7  Mean Corpuscular Hemoglobin Conc (MCHC) 33.0 - 37.0 g/dL 56.2  Red Cell Distribution Width (RDW) 12.3 - 17.0 % 13.4  Platelet Count (PLT) 150 - 450 10*3/uL 255  Mean Platelet Volume (MPV) 6.8 - 10.2 fL 8.0  Neutrophils % % 41  Lymphocytes % % 47  Monocytes % % 10  Eosinophils % % 1  Basophils % % 2  nRBC % % 0  Neutrophils Absolute 1.80 - 7.80 10*3/uL 3.60  Lymphocytes # 1.00 - 4.80 10*3/uL 4.20  Monocytes # 0.00 - 0.80 10*3/uL 0.90 High   Eosinophils # 0.00 - 0.50 10*3/uL 0.00  Basophils # 0.00 - 0.20 10*3/uL 0.20  nRBC Absolute <=0.00 10*3/uL 0.00   Component Ref Range & Units 10/03/2022  Sodium 136 - 145 mmol/L 137  Potassium 3.5 - 5.1 mmol/L 5.0    Chloride 98 - 107 mmol/L 100  CO2 21 - 31 mmol/L 31  Anion Gap 6 -  14 mmol/L 6  Glucose, Random 70 - 99 mg/dL 78  Blood Urea Nitrogen (BUN) 7 - 25 mg/dL 17  Creatinine 2.95 - 6.21 mg/dL 3.08  eGFR >65 HQ/ION/6.29B2 72    Albumin 3.5 - 5.7 g/dL 4.2  Total Protein 6.4 - 8.9 g/dL 7.8  Bilirubin, Total 0.3 - 1.0 mg/dL 0.3  Alkaline Phosphatase (ALP) 34 - 104 U/L 113 High    Aspartate Aminotransferase (AST) 13 - 39 U/L 28  Alanine Aminotransferase (ALT) 7 - 52 U/L 30  Calcium 8.6 - 10.3 mg/dL 9.6  BUN/Creatinine Ratio   Comment: Creatinine is normal, ratio is not clinically indicated.   Component Ref Range & Units 10/03/2022  B-Type Natriuretic Peptide (BNP) <100 pg/mL <50   Component Ref Range & Units 10/03/2022  Magnesium 1.9 - 2.7 mg/dL 2.0   Component Ref Range & Units 10/03/2022  Lipase 11 - 82 U/L 31    Component Ref Range & Units 4 wk ago (08/15/22) 3 mo ago (05/22/22) 4 mo ago (04/24/22)  Chromogranin A (ng/mL) 0.0 - 101.8 ng/mL 55.2 37.1 CM 37.8 CM      Latest Ref Rng & Units 09/17/2022   10:24 AM 06/19/2022    3:16 PM 05/22/2022    2:23 PM  CBC  WBC 4.0 - 10.5 K/uL 8.7  8.8  10.4   Hemoglobin 12.0 - 15.0 g/dL 84.1  32.4  40.1   Hematocrit 36.0 - 46.0 % 40.5  41.6  41.3   Platelets 150 - 400 K/uL 300  255  277       Latest Ref Rng & Units 09/17/2022   10:24 AM 08/15/2022   10:34 AM 06/19/2022    3:16 PM  CMP  Glucose 70 - 99 mg/dL 98  027  253   BUN 8 - 23 mg/dL 10  9  9    Creatinine 0.44 - 1.00 mg/dL 6.64  4.03  4.74   Sodium 135 - 145 mmol/L 139  137  138   Potassium 3.5 - 5.1 mmol/L 3.3  3.5  3.2   Chloride 98 - 111 mmol/L 100  100  101   CO2 22 - 32 mmol/L 31  28  27    Calcium 8.9 - 10.3 mg/dL 9.2  9.2  9.1   Total Protein 6.5 - 8.1 g/dL 8.0  7.7  7.5   Total Bilirubin 0.3 - 1.2 mg/dL 0.3  <2.5  0.3   Alkaline Phos 38 - 126 U/L 86  100  98   AST 15 - 41 U/L 15  14  17    ALT 0 - 44 U/L 11  9  15      Lab Results  Component Value Date   TOTALPROTELP 6.6 06/01/2020   ALBUMINELP 3.1 06/01/2020   A1GS 0.3 06/01/2020   A2GS 0.8 06/01/2020   BETS 1.1 06/01/2020   GAMS 1.4 06/01/2020   MSPIKE Not Observed 06/01/2020   SPEI Comment 06/01/2020    STUDIES:        HISTORY:   Allergies:  Allergies  Allergen Reactions   Atorvastatin Anaphylaxis   Codeine Nausea And Vomiting, Nausea Only and Shortness Of  Breath    Other reaction(s): GI Upset (intolerance)  Other Reaction(s): upset stomach   Lisinopril Anaphylaxis, Swelling and Other (See Comments)    Other Reaction(s): tongue swells up    Current Medications: Current Outpatient Medications  Medication Sig Dispense Refill   acetaminophen (TYLENOL) 500 MG tablet Take 500 mg by mouth every 6 (six) hours as needed  for mild pain.     apixaban (ELIQUIS) 5 MG TABS tablet Take 1 tablet (5 mg total) by mouth 2 (two) times daily. 60 tablet 0   baclofen (LIORESAL) 10 MG tablet Take 10 mg by mouth 2 (two) times daily.     ciprofloxacin (CILOXAN) 0.3 % ophthalmic ointment Place into both eyes 3 (three) times daily. 3.5 g 0   dicyclomine (BENTYL) 20 MG tablet Take 20 mg by mouth 4 (four) times daily as needed for spasms.     DULoxetine (CYMBALTA) 60 MG capsule Take 60 mg by mouth daily.     gabapentin (NEURONTIN) 600 MG tablet Take 600 mg by mouth 3 (three) times daily. Per patient     LINZESS 290 MCG CAPS capsule Take 290 mcg by mouth daily.     meclizine (ANTIVERT) 25 MG tablet Take 25 mg by mouth 4 (four) times daily as needed for dizziness.     mirtazapine (REMERON) 15 MG tablet TAKE 1 TABLET AT BEDTIME 90 tablet 3   ondansetron (ZOFRAN) 4 MG tablet Take 1 tablet (4 mg total) by mouth every 6 (six) hours. 12 tablet 0   ondansetron (ZOFRAN-ODT) 4 MG disintegrating tablet Take 4 mg by mouth 2 (two) times daily as needed for nausea or vomiting.     oxycodone (OXY-IR) 5 MG capsule Take 5 mg by mouth every 6 (six) hours as needed for pain.     pantoprazole (PROTONIX) 40 MG tablet Take 40 mg by mouth daily.     potassium chloride (KLOR-CON M) 10 MEQ tablet Take 1 tablet (10 mEq total) by mouth 2 (two) times daily. 60 tablet 5   pregabalin (LYRICA) 50 MG capsule Take by mouth.     rosuvastatin (CRESTOR) 10 MG tablet      sucralfate (CARAFATE) 1 g tablet Take 1 g by mouth daily.     No current facility-administered medications for this visit.       I,Oluwatobi Asade,acting as a scribe for [name], MD.,have documented all relevant documentation on the behalf of [name], MD,as directed by  [name], MD while in the presence of [name], MD.

## 2022-10-15 ENCOUNTER — Inpatient Hospital Stay: Payer: Medicare HMO | Attending: Hematology and Oncology

## 2022-10-15 VITALS — BP 106/70 | HR 73 | Temp 98.6°F | Resp 14

## 2022-10-15 DIAGNOSIS — Z79899 Other long term (current) drug therapy: Secondary | ICD-10-CM | POA: Diagnosis not present

## 2022-10-15 DIAGNOSIS — C7A012 Malignant carcinoid tumor of the ileum: Secondary | ICD-10-CM | POA: Insufficient documentation

## 2022-10-15 MED ORDER — OCTREOTIDE ACETATE 20 MG IM KIT
20.0000 mg | PACK | Freq: Once | INTRAMUSCULAR | Status: AC
  Administered 2022-10-15: 20 mg via INTRAMUSCULAR
  Filled 2022-10-15: qty 1

## 2022-11-06 NOTE — Progress Notes (Incomplete)
[facility]  [address] [place],  [place]  [address] [phone]  Clinic Day: 11/07/22   Referring physician: Galvin Proffer, MD  ASSESSMENT & PLAN:  Assessment & Plan: 1.  History of neuroendocrine/carcinoid tumor of the ascending colon, diagnosed in October 2016, treated with surgical resection.  Seven of eighteen lymph nodes were involved (7/18).  Primary tumor measured 2.2 x 1.7 x 1.5 cm, with three apparent vascular metastases including a nodule up to 3 cm with metastatic tumor present within 1 mm of the inked radial margin.  In the distal appendix there was also a separate 3 mm carcinoid tumor.  This was a staged as a pT3pN1 with low mitotic rate (0 per 10 high powered fields).  [name] was sporadic.  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  24 hour urine for 5HIAA was normal.  Chromogranin A from February was elevated at 262.3, but CT imaging was stable with no evidence of residual or recurrent disease.  She was placed on monthly octreotide injections in March 2022 with good response based on her symptoms and her decreasing chromogranin A, last measuring 48.8 in August.  CT imaging from August remains without evidence of recurrent or metastatic disease.   2.  Nausea and regurgitation. No clear etiology has been found. Dr.Butler has seen her multiple times in the past.  3.  Paresthesias and numbness of both lower extremities for at least the last 6 months. She is unable to ambulate. She has severe degenerative disk disease.  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 She was on a higher dose of gabapentin but felt this wasn't helping and so is down to 300 mg at bedtime.   4.  We searched for any central nervous system etiology for her problems and got an MRI of the brain which does show chronic ischemic changes.  It could be  related to her marijuana usage as well as her depression.   Plan:  I reiterated that she does have degenerative disc disease that can be causing the man pain of her back and legs, as she does not recall our previous conversations about this.  I explained this to her family member as well. She is scheduled to receive her Sandostatin injection after her appointment with me today. Her labs today are pending. I will schedule a CT abdomen and pelvis and call her with the results. I will see her back on June 13th to review her CT results and reevaluation with CBC and CMP and she will have her next injection the following week.  If she needs to see [place] sooner she knows to call.  She verbalizes understanding of and agreement to the plans discussed today. She knows to call the office should any new questions or concerns arise.  I provided 18 minutes of face-to-face time during this this encounter and > 50% was spent counseling as documented under my assessment and plan.     [facility] [place] [facility] [address] [place] [address] Dept: [phone] Dept Fax: [phone]   No orders of the defined types were placed in this encounter.      CHIEF COMPLAINT:  CC: Carcinoid syndrome  Current Treatment: Octreotide every 4 weeks   HISTORY OF PRESENT ILLNESS:  Kristi Romero is  a 67 year old. female referred by Dr. Charm Barges for a transfer of care and further management of her history of neuroendocrine and carcinoid tumor, which was diagnosed in October 2016.  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  She underwent surgical resection with Dr. Jeanell Sparrow. Pathology confirmed well  differentiated neuroendocrine tumor consistent with carcinoid tumor.  Seven of eighteen lymph nodes were involved (7/18).  Primary tumor measured 2.2 x 1.7 x 1.5 cm, with three apparent vascular metastases including a nodule up to 3 cm with metastatic tumor present within 1 mm of the inked radial margin.  In the distal appendix there was also a separate 3 mm carcinoid tumor.  This was a staged as a pT3pN1 with low mitotic rate (0 per 10 high powered fields).  She did require a PEG tube in 2017 for failure to thrive and this was removed in January 2018.  Her weight was up to 157 by September 2021.   Her last follow up was in January 2020 with Dr. Daleen Squibb until we started seeing her in early February 2022.  Khyleigh complained of weight loss, stomach pain and nausea.  She was evaluated by Dr. Charm Barges with endoscopy in November 2021 which revealed chronic gastritis and was negative for H.Pylori.  No evidence of malignancy was observed.  She also notes bilateral numbness "from her mouth to her feet".   She receives B12 injections every 6 months.  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  Chromogranin A from February was elevated at 262.3. She also had an elevated serum protein. Serum protein electrophoresis did not reveal a monoclonal spike. CT imaging revealed adrenal adenomas but no obvious evidence of recurrent/progressive disease.  She was started on octreotide injection in March 2022 with good response based on her decreasing chromogranin A.  CT chest, abdomen and pelvis in August revealed postoperative changes of right hemicolectomy with enterocolonic anastomosis without evidence of local recurrence or abdominopelvic metastatic disease.  Stable left adrenal gland adenoma.  MRI scan of the brain in August revealed evidence of chronic ischemia. She had normalization of the Chromogranin A in April. Chromogranin A from August was 48.8 down from 61.4 in April. The  chromogranin A was 51 in September.  She continues octreotide every 4 weeks.   INTERVAL HISTORY:  Kristi Romero is here today for repeat clinical assessment for carcinoid syndrome. Patient states that she feels *** and ***.     She denies signs of infection such as sore throat, sinus drainage, cough, or urinary symptoms.  She denies fevers or recurrent chills. She denies pain. She denies nausea, vomiting, chest pain, dyspnea or cough. Her appetite is *** and her weight {Weight change:10426}.  Patient states that she doesn't feel well and complains of pain in her epigastric area worse in the mornings, occasional nausea, numbness in her hands, arms, and legs, and swelling from her stomach down to her legs. I reiterated that she does have degenerative disc that can be causing the man pain of her back and legs as she doesn't recall our previous conversations. I reviewed this information with her granddaughter as well.  She is scheduled to receive her Sandostatin injection after her appointment with me today. Her labs today are pending. I  will schedule a CT abdomen and pelvis and call her with the results. I will see her back on June 13th to review her CT results and reevaluation. She denies signs of infection such as sore throat, sinus drainage, cough, or urinary symptoms.  She denies fevers or recurrent chills. She denies pain. She denies nausea, vomiting, chest pain, dyspnea or cough. Her appetite is fair and her weight has increased 4 pounds over last month . This patient is accompanied in the office by her  granddaughter .   REVIEW OF SYSTEMS:  Review of Systems  Constitutional:  Positive for appetite change (on/off). Negative for chills, diaphoresis, fatigue, fever and unexpected weight change.  HENT:  Negative.  Negative for hearing loss, lump/mass, mouth sores, nosebleeds, sore throat, tinnitus, trouble swallowing and voice change.   Eyes: Negative.  Negative for eye problems and icterus.  Respiratory:  Negative.  Negative for chest tightness, cough, hemoptysis, shortness of breath and wheezing.   Cardiovascular:  Positive for leg swelling (mild). Negative for chest pain and palpitations.  Gastrointestinal:  Positive for abdominal pain (epigastic area) and nausea. Negative for abdominal distention, blood in stool, constipation, diarrhea, rectal pain and vomiting.  Endocrine: Negative.   Genitourinary: Negative.  Negative for bladder incontinence, difficulty urinating, dyspareunia, dysuria, frequency, hematuria, menstrual problem, nocturia, pelvic pain, vaginal bleeding and vaginal discharge.   Musculoskeletal:  Positive for gait problem (in a wheelchair due to pain with ambulation) and neck pain (occasional). Negative for arthralgias, back pain, flank pain, myalgias and neck stiffness.  Skin: Negative.  Negative for itching, rash and wound.  Neurological:  Positive for gait problem (in a wheelchair due to pain with ambulation) and numbness (hands and arms and paresthesias of the bilateral legs). Negative for dizziness, extremity weakness, headaches, light-headedness, seizures and speech difficulty.  Hematological: Negative.  Negative for adenopathy. Does not bruise/bleed easily.  Psychiatric/Behavioral: Negative.  Negative for confusion, decreased concentration, depression, sleep disturbance and suicidal ideas. The patient is not nervous/anxious.      VITALS:  There were no vitals taken for this visit.  Wt Readings from Last 3 Encounters:  09/17/22 188 lb (85.3 kg)  09/17/22 188 lb 8 oz (85.5 kg)  08/19/22 184 lb 1.9 oz (83.5 kg)    There is no height or weight on file to calculate BMI.  Performance status (ECOG): 2 - Symptomatic, <50% confined to bed  PHYSICAL EXAM:  Physical Exam Vitals and nursing note reviewed.  Constitutional:      General: She is not in acute distress.    Appearance: Normal appearance. She is normal weight. She is not ill-appearing, toxic-appearing or diaphoretic.   HENT:     Head: Normocephalic and atraumatic.     Right Ear: Tympanic membrane, ear canal and external ear normal. There is no impacted cerumen.     Left Ear: Tympanic membrane, ear canal and external ear normal. There is no impacted cerumen.     Nose: Nose normal. No congestion or rhinorrhea.     Mouth/Throat:     Mouth: Mucous membranes are moist.     Pharynx: Oropharynx is clear. No oropharyngeal exudate or posterior oropharyngeal erythema.  Eyes:     General: No scleral icterus.       Right eye: No discharge.        Left eye: No discharge.     Extraocular Movements: Extraocular movements intact.     Conjunctiva/sclera: Conjunctivae normal.     Pupils: Pupils are equal, round, and reactive to light.  Neck:     Vascular: No carotid bruit.  Cardiovascular:     Rate and Rhythm: Normal rate and regular rhythm.     Pulses: Normal pulses.     Heart sounds: Normal heart sounds. No murmur heard.    No friction rub. No gallop.  Pulmonary:     Effort: Pulmonary effort is normal. No respiratory distress.     Breath sounds: Normal breath sounds. No stridor. No wheezing, rhonchi or rales.  Chest:     Chest wall: No tenderness.  Abdominal:     General: Bowel sounds are normal. There is no distension.     Palpations: Abdomen is soft. There is no hepatomegaly, splenomegaly or mass.     Tenderness: There is abdominal tenderness in the epigastric area. There is no right CVA tenderness, left CVA tenderness, guarding or rebound.     Hernia: No hernia is present.  Musculoskeletal:        General: No swelling, tenderness, deformity or signs of injury. Normal range of motion.     Cervical back: Normal range of motion and neck supple. No rigidity or tenderness.     Right lower leg: Edema (mild) present.     Left lower leg: Edema (mild) present.  Lymphadenopathy:     Cervical: No cervical adenopathy.  Skin:    General: Skin is warm and dry.     Coloration: Skin is not jaundiced or pale.      Findings: No bruising, erythema, lesion or rash.  Neurological:     General: No focal deficit present.     Mental Status: She is alert and oriented to person, place, and time. Mental status is at baseline.     Cranial Nerves: No cranial nerve deficit.     Sensory: No sensory deficit.     Motor: No weakness.     Coordination: Coordination normal.     Gait: Gait normal.     Deep Tendon Reflexes: Reflexes normal.  Psychiatric:        Mood and Affect: Mood normal.        Behavior: Behavior normal.        Thought Content: Thought content normal.        Judgment: Judgment normal.     LABS:   Component Ref Range & Units 10/10/2022   WBC, automated 3.4 - 11.0 10*3/uL 10.4  RBC 3.70 - 5.50 10*6/uL 4.26  Hemoglobin 12.0 - 16.0 g/dL 57.8  Hematocrit 46.9 - 48.0 % 39.4  MCV 80.0 - 97.0 fL 92.5  MCH 27.0 - 33.0 pg 31.4  MCHC 30.0 - 35.0 g/dL 62.9  RDW 0.0 - 52.8 % 13.1  Platelets 150 - 400 10*3/uL 269   Component Ref Range & Units 10/10/2022   Sodium 136 - 144 mmol/L 137  Potassium 3.5 - 5.0 mmol/L 4.4  Chloride 98 - 108 mmol/L 102  CO2 22 - 29 mmol/L 29  BUN 8 - 20 mg/dl 13  Creatinine 4.13 - 2.44 mg/dL 0.10  Calcium 8.7 - 27.2 mg/dl 53.6  Albumin (g/dl) In Ser/Plas 3.7 - 4.8 g/dL 4.0  Protein 6.5 - 8.3 g/dL 8.0  Alkaline Phosphatase 40 - 120 U/L 108  ALT (SGPT) 0 - 55 U/L 16  AST 10 - 41 U/L 19  Total Bilirubin 0.0 - 1.2 mg/dl 0.2  Glucose 70 - 644 mg/dL 90   Component Ref Range & Units 10/10/2022   Lactic Acid 0.50 - 2.00 mmol/L 1.07   Component Ref Range & Units 10/10/2022  Troponin I <0.04 ng/mL 0.02   Component Ref Range & Units 10/10/2022   Total CK 38 - 200 IU/L 40   Component Ref Range & Units 10/10/2022   Magnesium 1.8 - 2.6 mg/dl 2.1   Component Ref Range & Units 3 wk ago  Sed Rate 0 - 24 mm/hr 40 High    Component Ref Range & Units 4 wk ago (08/15/22) 3 mo ago (05/22/22) 4 mo ago (04/24/22)  Chromogranin A  (ng/mL) 0.0 - 101.8 ng/mL 55.2 37.1 CM 37.8 CM      Latest Ref Rng & Units 09/17/2022   10:24 AM 06/19/2022    3:16 PM 05/22/2022    2:23 PM  CBC  WBC 4.0 - 10.5 K/uL 8.7  8.8  10.4   Hemoglobin 12.0 - 15.0 g/dL 29.5  28.4  13.2   Hematocrit 36.0 - 46.0 % 40.5  41.6  41.3   Platelets 150 - 400 K/uL 300  255  277       Latest Ref Rng & Units 09/17/2022   10:24 AM 08/15/2022   10:34 AM 06/19/2022    3:16 PM  CMP  Glucose 70 - 99 mg/dL 98  440  102   BUN 8 - 23 mg/dL 10  9  9    Creatinine 0.44 - 1.00 mg/dL 7.25  3.66  4.40   Sodium 135 - 145 mmol/L 139  137  138   Potassium 3.5 - 5.1 mmol/L 3.3  3.5  3.2   Chloride 98 - 111 mmol/L 100  100  101   CO2 22 - 32 mmol/L 31  28  27    Calcium 8.9 - 10.3 mg/dL 9.2  9.2  9.1   Total Protein 6.5 - 8.1 g/dL 8.0  7.7  7.5   Total Bilirubin 0.3 - 1.2 mg/dL 0.3  <3.4  0.3   Alkaline Phos 38 - 126 U/L 86  100  98   AST 15 - 41 U/L 15  14  17    ALT 0 - 44 U/L 11  9  15      Lab Results  Component Value Date   TOTALPROTELP 6.6 06/01/2020   ALBUMINELP 3.1 06/01/2020   A1GS 0.3 06/01/2020   A2GS 0.8 06/01/2020   BETS 1.1 06/01/2020   GAMS 1.4 06/01/2020   MSPIKE Not Observed 06/01/2020   SPEI Comment 06/01/2020    STUDIES:        HISTORY:   Allergies:  Allergies  Allergen Reactions   Atorvastatin Anaphylaxis   Codeine Nausea And Vomiting, Nausea Only and Shortness Of Breath    Other reaction(s): GI Upset (intolerance)  Other Reaction(s): upset stomach   Lisinopril Anaphylaxis, Swelling and Other (See Comments)    Other Reaction(s): tongue swells up    Current Medications: Current Outpatient Medications  Medication Sig Dispense Refill   acetaminophen (TYLENOL) 500 MG tablet Take 500 mg by mouth every 6 (six) hours as needed for mild pain.     apixaban (ELIQUIS) 5 MG TABS tablet Take 1 tablet (5 mg total) by mouth 2 (two) times daily. 60 tablet 0   baclofen (LIORESAL) 10 MG tablet Take 10 mg by mouth 2 (two) times daily.      ciprofloxacin (CILOXAN) 0.3 % ophthalmic ointment Place into both eyes 3 (three) times daily. 3.5 g 0   dicyclomine (BENTYL) 20 MG tablet Take 20 mg by mouth 4 (four) times daily as needed for spasms.     DULoxetine (CYMBALTA) 60 MG capsule Take 60 mg  by mouth daily.     gabapentin (NEURONTIN) 600 MG tablet Take 600 mg by mouth 3 (three) times daily. Per patient     LINZESS 290 MCG CAPS capsule Take 290 mcg by mouth daily.     meclizine (ANTIVERT) 25 MG tablet Take 25 mg by mouth 4 (four) times daily as needed for dizziness.     mirtazapine (REMERON) 15 MG tablet TAKE 1 TABLET AT BEDTIME 90 tablet 3   ondansetron (ZOFRAN) 4 MG tablet Take 1 tablet (4 mg total) by mouth every 6 (six) hours. 12 tablet 0   ondansetron (ZOFRAN-ODT) 4 MG disintegrating tablet Take 4 mg by mouth 2 (two) times daily as needed for nausea or vomiting.     oxycodone (OXY-IR) 5 MG capsule Take 5 mg by mouth every 6 (six) hours as needed for pain.     pantoprazole (PROTONIX) 40 MG tablet Take 40 mg by mouth daily.     potassium chloride (KLOR-CON M) 10 MEQ tablet Take 1 tablet (10 mEq total) by mouth 2 (two) times daily. 60 tablet 5   pregabalin (LYRICA) 50 MG capsule Take by mouth.     rosuvastatin (CRESTOR) 10 MG tablet      sucralfate (CARAFATE) 1 g tablet Take 1 g by mouth daily.     No current facility-administered medications for this visit.     I,Jasmine M Lassiter,acting as a scribe for [name], MD.,have documented all relevant documentation on the behalf of [name], MD,as directed by  [name], MD while in the presence of [name], MD.

## 2022-11-07 ENCOUNTER — Inpatient Hospital Stay: Payer: Medicare HMO | Admitting: Oncology

## 2022-11-07 ENCOUNTER — Inpatient Hospital Stay: Payer: Medicare HMO

## 2022-11-10 ENCOUNTER — Other Ambulatory Visit: Payer: Medicare HMO

## 2022-11-11 ENCOUNTER — Ambulatory Visit: Payer: Medicare HMO | Admitting: Oncology

## 2022-11-11 ENCOUNTER — Other Ambulatory Visit: Payer: Medicare HMO

## 2022-11-11 NOTE — Progress Notes (Signed)
[facility]  [address] 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  Seven of eighteen lymph nodes were involved (7/18).  Primary tumor measured 2.2 x 1.7 x 1.5 cm, with three apparent vascular metastases including a nodule up to 3 cm with metastatic tumor present within 1 mm of the inked radial margin.  In the distal appendix there was also a separate 3 mm carcinoid tumor.  This was a staged as a pT3pN1 with low mitotic rate (0 per 10 high powered fields).  [name] was sporadic.  We began seeing her in February 2022 for carcinoid syndrome symptoms.  Her chromogranin A was elevated at 262.  CT chest, abdomen and pelvis did not reveal any evidence of recurrent or metastatic disease.  She was started on octreotide every 4 weeks.  She had normalization of her chromogranin A and this has remained normal.  Routine CT imaging has remained without evidence of recurrent or metastatic disease, most recently in May 2024.  I once again explained about her disease, the blood test monitoring and the octreotide injections, which can control symptoms of flushing, as well as suppress tumor growth to the patient and her son.  I also gave her written information about carcinoid.  They both expressed understanding and denied further questions.  She is having continued abdominal pain, nausea and decreased appetite, which I do not feel is related to the carcinoid.  I advised her to take the Linzess daily to more frequent bowel movements, as this may help her symptoms.  As she does not know if she has seen Dr. Jennye Boroughs, we will check on an appointment for her.  She will proceed with octreotide this week.  We  will plan to see her back in 1 month with a CBC, comprehensive metabolic panel and chromogranin A prior to her next octreotide.  Neuropathy She saw Dr. Nedra Hai, neurology, [facility] in October 2023.  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  The patient is not sure if she ever tried the pregabalin he recommended or the Celebrex and change to tizanidine recommended during hospitalization at [facility] in February.  History of osteomyelitis Osteomyelitis in June 2023, status post prolonged antibiotic treatment.  There has been chronic changes on MRI since that time, most recently consistent with resolving osteomyelitis.  History of pulmonary embolism Pulmonary embolism felt to be provoked in May 2023.  She was given IV heparin overnight and discharged on Eliquis.  It is unclear how long she took Eliquis for.  There is no indication to resume Eliquis at this time.    The patient understands the plans discussed today and is in agreement with them.  She knows to contact our office if she develops concerns prior to her next appointment.   I provided 40 minutes of face-to-face time during this encounter and > 50% was spent counseling as documented under my assessment and plan.    Kristi Perl, PA-C  [facility] AT [facility] [address] [place] [address] Dept: [phone] Dept Fax:  [phone]   Orders Placed This Encounter  Procedures   Chromogranin A    Standing Status:   Future    Standing Expiration Date:   11/12/2023      CHIEF COMPLAINT:  CC: Malignant carcinoid   Current Treatment: Octreotide every 4 weeks  HISTORY OF PRESENT ILLNESS:  Kristi Romero is a 67 year old. female who we began seeing in in February 2022 at the request of Dr. Charm Barges.   She had a history of malignant carcinoid (T3 N2 M0) of the small bowel diagnosed in 2016.  CT abdomen revealed potentially hypervascular masslike lesion anterior to the right psoas muscle in the right lower quadrant measuring 2.6 x 2 cm, as well as a hypervascular mass in the ascending colon adjacent to the ileocecal valve concerning for mesenteric metastatic disease.  She was treated with right hemicolectomy.  Pathology revealed well-differentiated neuroendocrine tumor of the ileum measuring 2.2 x 1.7 x 1.5 cm with 7 of 18 lymph nodes positive for metastatic carcinoma with a low mitotic rate. Three apparent vascular metastases, with a nodule up to 3.0 cm in diameter in addition to the lymph nodes. Apparently separate 0.3 cm carcinoid tumor in the distal appendix.  Metastatic tumor present within 1 mm of inked radial margin. Endoscopy in November 2021 revealed chronic gastritis and was negative for H.Pylori.  No evidence of malignancy was observed.  When we began seeing her, she reported hot flashes, which we felt represented flushing from carcinoid syndrome.  She also reported bilateral numbness "from her mouth to her feet". She was on B12 injections every 6 months. Chromogranin A was elevated at 262.3. She also had an elevated serum protein. Serum protein electrophoresis did not reveal a monoclonal spike. CT imaging revealed and adrenal adenomas, but no obvious evidence of recurrent/progressive disease. She was placed on monthly octreotide injections in March 2022 with good response based on her decreasing chromogranin A.  CT chest, abdomen and pelvis in August revealed postoperative changes of right hemicolectomy with enterocolonic anastomosis without evidence of local recurrence or abdominopelvic metastatic disease.  Stable left adrenal gland adenoma.  MRI scan of the brain in August revealed evidence of chronic ischemia. She had normalization of the Chromogranin A in April. Chromogranin A from August was 48.8  down from 61.4 in April. The chromogranin A was 51 in September.  She continued octreotide every 4 weeks.    She was admitted from the ER at [facility] in early May 2023 with sepsis.  She presented with shortness of breath, abdominal pain, nausea, vomiting and diarrhea. CTA chest and CT abdomen/pelvis did not reveal any evidence of pulmonary emboli, or current or metastatic carcinoid.  Stable left adrenal adenoma.  CT head/cervical spine at that time did not reveal any acute intracranial abnormality.  Multilevel degenerative changes in the cervical spine with prior C5-6 spinal fusion.  No acute osseous findings are seen.  There was an incidental finding of a 2.1 cm left thyroid nodule, thyroid ultrasound recommended.  The CT images were suboptimal due to motion degradation.   Chest x-ray later revealed left lower lobe pneumonia.  Thyroid ultrasound revealed a 1.8 cm left mid thyroid nodule meeting criteria for follow-up in 1 year.  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  It was recommended she follow-up with GI after discharge.  In mid May, she presented to [facility] and was found to have a small subsegmental nonocclusive right lower lobe pulmonary embolism.  She was admitted overnight and placed on on  IV heparin.  She was then discharged on Eliquis.  She was seen again in the ER at [facility] in June for abdominal pain.  CT abdomen and pelvis in June 2023 revealed increased size of lytic lesions and reactive sclerosis involving T10-11 vertebral body suspicious for infectious discitis.  MRI cervical, thoracic and lumbar spine revealed bone marrow edema in the T1 and T2 vertebral bodies and to lesser extent superior aspect of the T3 vertebral body with fluid signal in the T2-3 disc space. Fluid signal in the T10-11 disc space with bone marrow edema throughout the T10 and T11 vertebral bodies. These findings were most concerning for discitis-osteomyelitis.  She was  admitted and treated with broad-spectrum antibiotics.  Blood cultures were negative.  Biopsy of T11 did not reveal evidence of acute osteomyelitis or malignancy.   She had a PICC line placed and was treated with 6 weeks of Rocephin 2 grams daily, which she completed in July.   She was seen in the ER in late July for worsening lower extremity pain and paresthesias.  Lateral lower extremity venous Doppler ultrasound did not reveal any evidence of deep venous thrombosis.  It was recommended she continue gabapentin with Percocet as needed, as previously prescribed.  She apparently presented to the [facility] ER again in mid August with same complaints, but left before being seen.  She was seen in the ER at [facility] a couple of days later.  They concluded her symptoms were related to neuropathy and recommended continuing gabapentin, 600 mg 3 times a day, as well as increasing your baclofen to 10 mg 3 times a day. CT abdomen and pelvis in August did not reveal any evidence of recurrent or metastatic carcinoid.  Stable small benign left adrenal adenoma.  Stable appearance of chronic discitis at T10-11.  She had been seen in the ER with worsening paresthesias on multiple occasions.  She saw neurology in October 2023 for numbness and tingling in both hands and up to her elbows, as well as both feet up to her legs which had been going on for several months, and was associated with diminished grip strength, per neurology suspected multifactorial from sensory neuropathy noted on a previous EMG and myelomalacia in the cervical spinal cord after previous neck surgery, though spinal instability from osteomyelitis could not be ruled out.  She was started on pregabalin at that time, however, it is uncertain if she started pregabalin as she has continued gabapentin.  MRI thoracic spine in November done by orthopedic surgery revealed edematous endplate marrow changes at T10-11 with T2 hyperintensity in the disc  space. Mild prominence of paraspinous soft tissues noted. Findings are concerning for early disc osteomyelitis.  There is also bilateral foraminal narrowing at T10 11% on the right, and mild right foraminal narrowing at T3-4, T4-5, T6-7, T9-10, and T11-12 with mild left foraminal narrowing at T2-3.  She was apparently referred to [facility] by orthopedic surgery.  She was hospitalized with worsening low back pain and inability to ambulate.  MRI thoracic spine revealed edema and enhancement involving the C7-T3 and T10-T11 vertebral bodies as well as the intervening disc spaces. There is also abnormal soft tissue enhancement in the surrounding soft tissues at these levels.  These findings were favored to represent discitis-osteomyelitis at C7-T3 and T10-T11 with phlegmonous changes in the surrounding soft tissues. No evidence of epidural abscess.  Repeat blood cultures were negative.  She was discharged on a 2-week course of cefdinir.   She was  hospitalized at [facility] with worsening back pain and paresthesias again in February with suspected discitis/osteomyelitis.  MRI cervical, thoracic and lumbar spine revealed changes favored to represent discitis-osteomyelitis at C7-T3 and T10-T11 with phlegmonous changes in the surrounding soft tissues.  There was no evidence of epidural abscess.  She underwent biopsy at T10-T11 and culture was negative, so she was not felt to current discitis/osteomyelitis.  Celebrex added and home gabapentin increased to 900 TID for pain, as well as changing baclofen to tizanidine with improved pain control also recommended on discharge.  She does not appear to have started this regimen to either.  During her hospitalization GI referral was recommended for colonoscopy due to her history of Streptococcus gallolyticus bacteremia.  She had an appointment with GI at [facility], but requested a local referral.  We referred her to Dr. Jennye Boroughs in May.  The chromogranin A remains normal, so we have  continued octreotide every 4 weeks.  Oncology History  Malignant carcinoid tumor of ileum (HCC)  04/28/2014 Initial Diagnosis   Malignant carcinoid tumor of ileum (HCC)   02/02/2015 Cancer Staging   Staging form: Neuroendocrine Tumor - Duodenum/Ampulla/Jejunum/Illeum, AJCC 7th Edition - Clinical stage from 02/02/2015: Stage IIIB (T3(3), N1, M0) - Signed by [name], MD on 05/28/2020 Staged by: Managing physician Diagnostic confirmation: Positive histology Specimen type: Excision Histopathologic type: Carcinoid tumor, NOS Stage prefix: Initial diagnosis Tumor size (mm): 22 Multiple tumors: Yes Number of tumors: 3 Histologic grade (G): G1 Lymph-vascular invasion (LVI): LVI not present (absent)/not identified Residual tumor (R): R0 - None Stage used in treatment planning: Yes National guidelines used in treatment planning: Yes Type of national guideline used in treatment planning: NCCN Staging comments: Surgical resection       INTERVAL HISTORY:  Areliz is here today for repeat clinical assessment and once again asks why she comes here and gets injections monthly.  She reports fatigue.  She abdominal pain and nausea, which are not new complaints.  She rates her pain 10 out of 10 today.  She states she is not eating much due to this.  She denies acid reflux.  She struggles with constipation and has been on Linzess for some time now, but she is only taking this as needed and having a bowel movement every 2 to 3 days.  She feels like she is swelling all over.  There is no evidence of edema.  She continues to bitterly complain of neuropathy which makes it difficult for her to be on her feet for any length of time.  She denies fevers or chills. She denies pain. Her appetite is decreased. Her weight has decreased 1 pounds over last 2 months .  She continues to smoke marijuana she does not know if she has seen Dr. Jennye Boroughs.  Bilateral screening mammogram in September 2023 did not  reveal any evidence of malignancy.  REVIEW OF SYSTEMS:  Review of Systems  Constitutional:  Positive for appetite change and fatigue. Negative for chills, fever and unexpected weight change.  HENT:   Negative for lump/mass, mouth sores and sore throat.   Respiratory:  Negative for cough and shortness of breath.   Cardiovascular:  Positive for leg swelling. Negative for chest pain.  Gastrointestinal:  Positive for abdominal distention, constipation and nausea. Negative for abdominal pain, blood in stool, diarrhea, rectal pain and vomiting.  Endocrine: Negative for hot flashes.  Genitourinary:  Negative for difficulty urinating, dysuria, frequency and hematuria.   Musculoskeletal:  Negative for arthralgias, back pain and myalgias.  Skin:  Negative for rash.  Neurological:  Positive for numbness. Negative for dizziness and headaches.  Hematological:  Negative for adenopathy. Does not bruise/bleed easily.  Psychiatric/Behavioral:  Negative for depression and sleep disturbance. The patient is not nervous/anxious.      VITALS:  Blood pressure 122/79, pulse 66, temperature 98.2 F (36.8 C), temperature source Oral, resp. rate 18, height 5\' 4"  (1.626 m), weight 187 lb 9.6 oz (85.1 kg), SpO2 100%.  Wt Readings from Last 3 Encounters:  11/12/22 187 lb 9.6 oz (85.1 kg)  09/17/22 188 lb (85.3 kg)  09/17/22 188 lb 8 oz (85.5 kg)    Body mass index is 32.2 kg/m.  Performance status (ECOG): 2 - Symptomatic, <50% confined to bed  PHYSICAL EXAM:  Physical Exam Vitals and nursing note reviewed.  Constitutional:      General: She is not in acute distress.    Appearance: Normal appearance.  HENT:     Head: Normocephalic and atraumatic.     Mouth/Throat:     Mouth: Mucous membranes are moist.     Pharynx: Oropharynx is clear. No oropharyngeal exudate or posterior oropharyngeal erythema.  Eyes:     General: No scleral icterus.    Extraocular Movements: Extraocular movements intact.      Conjunctiva/sclera: Conjunctivae normal.     Pupils: Pupils are equal, round, and reactive to light.  Cardiovascular:     Rate and Rhythm: Normal rate and regular rhythm.     Heart sounds: Normal heart sounds. No murmur heard.    No friction rub. No gallop.  Pulmonary:     Effort: Pulmonary effort is normal.     Breath sounds: Normal breath sounds. No wheezing, rhonchi or rales.  Abdominal:     General: Bowel sounds are normal. There is no distension.     Palpations: Abdomen is soft. There is no hepatomegaly, splenomegaly or mass.     Tenderness: There is no abdominal tenderness.  Musculoskeletal:        General: No swelling. Normal range of motion.     Cervical back: Normal range of motion and neck supple. No tenderness.     Right lower leg: No edema.     Left lower leg: No edema.  Lymphadenopathy:     Cervical: No cervical adenopathy.     Upper Body:     Right upper body: No supraclavicular or axillary adenopathy.     Left upper body: No supraclavicular or axillary adenopathy.  Skin:    General: Skin is warm and dry.     Coloration: Skin is not jaundiced.     Findings: No rash.  Neurological:     Mental Status: She is alert and oriented to person, place, and time.     Cranial Nerves: No cranial nerve deficit.  Psychiatric:        Mood and Affect: Mood normal.        Behavior: Behavior normal.        Thought Content: Thought content normal.     LABS:      Latest Ref Rng & Units 11/12/2022   10:07 AM 09/17/2022   10:24 AM 06/19/2022    3:16 PM  CBC  WBC 4.0 - 10.5 K/uL 9.1  8.7  8.8   Hemoglobin 12.0 - 15.0 g/dL 78.2  95.6  21.3   Hematocrit 36.0 - 46.0 % 40.3  40.5  41.6   Platelets 150 - 400 K/uL 309  300  255  Latest Ref Rng & Units 11/12/2022   10:07 AM 09/17/2022   10:24 AM 08/15/2022   10:34 AM  CMP  Glucose 70 - 99 mg/dL 91  98  295   BUN 8 - 23 mg/dL 10  10  9    Creatinine 0.44 - 1.00 mg/dL 2.84  1.32  4.40   Sodium 135 - 145 mmol/L 137  139  137    Potassium 3.5 - 5.1 mmol/L 3.8  3.3  3.5   Chloride 98 - 111 mmol/L 101  100  100   CO2 22 - 32 mmol/L 28  31  28    Calcium 8.9 - 10.3 mg/dL 9.2  9.2  9.2   Total Protein 6.5 - 8.1 g/dL 7.9  8.0  7.7   Total Bilirubin 0.3 - 1.2 mg/dL 0.3  0.3  <1.0   Alkaline Phos 38 - 126 U/L 94  86  100   AST 15 - 41 U/L 29  15  14    ALT 0 - 44 U/L 26  11  9       No results found for: "CEA1", "CEA" / No results found for: "CEA1", "CEA" No results found for: "PSA1" No results found for: "UVO536" No results found for: "CAN125"   Lab Results  Component Value Date   TOTALPROTELP 6.6 06/01/2020   ALBUMINELP 3.1 06/01/2020   A1GS 0.3 06/01/2020   A2GS 0.8 06/01/2020   BETS 1.1 06/01/2020   GAMS 1.4 06/01/2020   MSPIKE Not Observed 06/01/2020   SPEI Comment 06/01/2020   Lab Results  Component Value Date   TIBC 244 (L) 09/06/2021   FERRITIN 873 (H) 09/06/2021   IRONPCTSAT 9 (L) 09/06/2021   No results found for: "LDH"  STUDIES:    Exam(s): [id number] CT/CT ABD-PELV W/IV CM  CLINICAL DATA: Ileal carcinoid  EXAM:  CT ABDOMEN AND PELVIS WITH CONTRAST  TECHNIQUE:  Multidetector CT imaging of the abdomen and pelvis was performed  using the standard protocol following bolus administration of  intravenous contrast.  RADIATION DOSE REDUCTION: This exam was performed according to the  departmental dose-optimization program which includes automated  exposure control, adjustment of the mA and/or kV according to  patient size and/or use of iterative reconstruction technique.  CONTRAST: 100 mL Isovue 370 IV  COMPARISON: CT abdomen/pelvis dated 05/11/2022.  FINDINGS:  Lower chest: Lung bases are essentially clear.  Hepatobiliary: Liver is within normal limits. No  suspicious/enhancing hepatic lesions.  Status post cholecystectomy. No intrahepatic or extrahepatic duct  dilatation.  Pancreas: Within normal limits.  Spleen: Within normal limits.  Adrenals/Urinary Tract: Right adrenal gland is  within normal limits.  Stable 2.4 cm left adrenal nodule, favoring a lipid poor adenoma.  Kidneys are within normal limits. No hydronephrosis.  Bladder is within normal limits.  Stomach/Bowel: Stomach is within normal limits.  No evidence of bowel obstruction.  Status post ileocecal resection.  Appendix is not discretely visualized.  No colonic wall thickening or inflammatory changes.  Vascular/Lymphatic: No evidence of abdominal aortic aneurysm.  Atherosclerotic calcifications of the abdominal aorta and branch  vessels.  No suspicious abdominopelvic lymphadenopathy.  Reproductive: Status post hysterectomy.  No adnexal masses.  Other: No abdominopelvic ascites.  Musculoskeletal: Degenerative changes of the visualized  thoracolumbar spine.  IMPRESSION:  Status post ileocecal resection.  No evidence of recurrent or metastatic disease    HISTORY:   Past Medical History:  Diagnosis Date   Abdominal pain, epigastric    Accident occurring in home 05/07/2016   Acute  respiratory failure with hypoxia (HCC) 09/06/2021   Adrenal adenoma 03/16/2018   Anxiety and depression 03/16/2018   BMI 26.0-26.9,adult    Cervicogenic headache 10/14/2016   Chest pain, unspecified    Chronic abdominal pain and nausea  09/06/2021   Chronic back pain    Constipation 04/08/2021   Dizziness 10/14/2016   Elevated lipase 01/13/2017   Formatting of this note might be different from the original. 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   Normocytic anemia 09/06/2021   Numbness    Numbness and tingling 10/14/2016   Pulmonary embolism with acute respiratory failure with hypoxia  09/06/2021   Restless leg syndrome    Sleeping difficulty 04/18/2016   Spondylosis, cervical, 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 Dr Arrie Aran [place]    COLONOSCOPY  09/05/2020   Dr Rayfield Citizen Benign neoplasm of transverse colon. Benign neoplasm of descending colon.   ESOPHAGOGASTRODUODENOSCOPY  02/2018   ESOPHAGOGASTRODUODENOSCOPY (EGD) WITH PROPOFOL N/A 03/16/2018   Procedure: ESOPHAGOGASTRODUODENOSCOPY (EGD) WITH PROPOFOL;  Surgeon: Meryl Dare, MD;  Location: [facility] ENDOSCOPY;  Service: Endoscopy;  Laterality: N/A;   NECK SURGERY     PARTIAL HYSTERECTOMY     TOE SURGERY Bilateral     Family History  Problem Relation Age of Onset   Lung cancer Father    Heart disease Father    Hypertension Father    Heart disease Mother    Hypertension Mother    Cancer Mother     Social History:  reports that she has quit smoking. She has never used smokeless tobacco. She reports current drug use. Drug: Marijuana. She reports that she does not drink alcohol.The patient is accompanied by her son today.  Allergies:  Allergies  Allergen Reactions   Atorvastatin Anaphylaxis   Codeine Nausea And Vomiting, Nausea Only and Shortness Of Breath    Other reaction(s): GI Upset (intolerance)  Other Reaction(s): upset stomach   Lisinopril Anaphylaxis, Swelling and Other (See Comments)    Other Reaction(s):  tongue swells up    Current Medications: Current Outpatient Medications  Medication Sig Dispense Refill   acetaminophen (TYLENOL) 500 MG tablet Take 500 mg by mouth every 6 (six) hours as needed for mild pain.     apixaban (ELIQUIS) 5 MG TABS tablet Take 1 tablet (5 mg total) by mouth 2 (two) times daily. (Patient not taking: Reported on 11/12/2022) 60 tablet 0   baclofen (LIORESAL) 10 MG tablet Take 10 mg by mouth 2 (two) times daily.     dicyclomine (BENTYL) 20 MG tablet Take 20 mg by  mouth 4 (four) times daily as needed for spasms.     DULoxetine (CYMBALTA) 60 MG capsule Take 60 mg by mouth daily.     gabapentin (NEURONTIN) 600 MG tablet Take 600 mg by mouth 3 (three) times daily. Per patient     LINZESS 290 MCG CAPS capsule Take 290 mcg by mouth daily. Patient reports taking as needed     meclizine (ANTIVERT) 25 MG tablet Take 25 mg by mouth 4 (four) times daily as needed for dizziness.     mirtazapine (REMERON) 15 MG tablet TAKE 1 TABLET AT BEDTIME (Patient not taking: Reported on 11/12/2022) 90 tablet 3   ondansetron (ZOFRAN) 4 MG tablet Take 1 tablet (4 mg total) by mouth every 6 (six) hours. 12 tablet 0   ondansetron (ZOFRAN-ODT) 4 MG disintegrating tablet Take 4 mg by mouth 2 (two) times daily as needed for nausea or vomiting.     pantoprazole (PROTONIX) 40 MG tablet Take 40 mg by mouth daily.     potassium chloride (KLOR-CON M) 10 MEQ tablet Take 1 tablet (10 mEq total) by mouth 2 (two) times daily. 60 tablet 5   pregabalin (LYRICA) 50 MG capsule Take by mouth. (Patient not taking: Reported on 11/12/2022)     rosuvastatin (CRESTOR) 10 MG tablet      sucralfate (CARAFATE) 1 g tablet Take 1 g by mouth daily.     No current facility-administered medications for this visit.   Facility-Administered Medications Ordered in Other Visits  Medication Dose Route Frequency Provider Last Rate Last Admin   heparin lock flush 100 unit/mL  500 Units Intracatheter Once PRN Ilda Basset A, NP        sodium chloride flush (NS) 0.9 % injection 10 mL  10 mL Intracatheter PRN Pascal Lux, NP

## 2022-11-12 ENCOUNTER — Inpatient Hospital Stay (HOSPITAL_BASED_OUTPATIENT_CLINIC_OR_DEPARTMENT_OTHER): Payer: Medicare HMO | Admitting: Hematology and Oncology

## 2022-11-12 ENCOUNTER — Encounter: Payer: Self-pay | Admitting: Hematology and Oncology

## 2022-11-12 ENCOUNTER — Inpatient Hospital Stay: Payer: Medicare HMO | Attending: Hematology and Oncology

## 2022-11-12 ENCOUNTER — Inpatient Hospital Stay: Payer: Medicare HMO

## 2022-11-12 ENCOUNTER — Encounter: Payer: Self-pay | Admitting: Oncology

## 2022-11-12 VITALS — BP 122/79 | HR 66 | Temp 98.2°F | Resp 18 | Ht 64.0 in | Wt 187.6 lb

## 2022-11-12 VITALS — BP 125/80 | HR 65 | Temp 98.1°F | Resp 18

## 2022-11-12 DIAGNOSIS — C7A012 Malignant carcinoid tumor of the ileum: Secondary | ICD-10-CM | POA: Insufficient documentation

## 2022-11-12 DIAGNOSIS — Z79899 Other long term (current) drug therapy: Secondary | ICD-10-CM | POA: Insufficient documentation

## 2022-11-12 DIAGNOSIS — Z86711 Personal history of pulmonary embolism: Secondary | ICD-10-CM | POA: Insufficient documentation

## 2022-11-12 DIAGNOSIS — G629 Polyneuropathy, unspecified: Secondary | ICD-10-CM

## 2022-11-12 DIAGNOSIS — C7B8 Other secondary neuroendocrine tumors: Secondary | ICD-10-CM | POA: Diagnosis not present

## 2022-11-12 DIAGNOSIS — Z8739 Personal history of other diseases of the musculoskeletal system and connective tissue: Secondary | ICD-10-CM

## 2022-11-12 HISTORY — DX: Personal history of other diseases of the musculoskeletal system and connective tissue: Z87.39

## 2022-11-12 LAB — CMP (CANCER CENTER ONLY)
ALT: 26 U/L (ref 0–44)
AST: 29 U/L (ref 15–41)
Albumin: 3.6 g/dL (ref 3.5–5.0)
Alkaline Phosphatase: 94 U/L (ref 38–126)
Anion gap: 8 (ref 5–15)
BUN: 10 mg/dL (ref 8–23)
CO2: 28 mmol/L (ref 22–32)
Calcium: 9.2 mg/dL (ref 8.9–10.3)
Chloride: 101 mmol/L (ref 98–111)
Creatinine: 0.84 mg/dL (ref 0.44–1.00)
GFR, Estimated: >60 mL/min (ref >60)
Glucose, Bld: 91 mg/dL (ref 70–99)
Potassium: 3.8 mmol/L (ref 3.5–5.1)
Sodium: 137 mmol/L (ref 135–145)
Total Bilirubin: 0.3 mg/dL (ref 0.3–1.2)
Total Protein: 7.9 g/dL (ref 6.5–8.1)

## 2022-11-12 LAB — CBC WITH DIFFERENTIAL (CANCER CENTER ONLY)
Abs Immature Granulocytes: 0.02 10*3/uL (ref 0.00–0.07)
Basophils Absolute: 0 10*3/uL (ref 0.0–0.1)
Basophils Relative: 0 %
Eosinophils Absolute: 0.1 10*3/uL (ref 0.0–0.5)
Eosinophils Relative: 1 %
HCT: 40.3 % (ref 36.0–46.0)
Hemoglobin: 12.2 g/dL (ref 12.0–15.0)
Immature Granulocytes: 0 %
Lymphocytes Relative: 51 %
Lymphs Abs: 4.6 10*3/uL — ABNORMAL HIGH (ref 0.7–4.0)
MCH: 29.5 pg (ref 26.0–34.0)
MCHC: 30.3 g/dL (ref 30.0–36.0)
MCV: 97.3 fL (ref 80.0–100.0)
Monocytes Absolute: 0.6 10*3/uL (ref 0.1–1.0)
Monocytes Relative: 7 %
Neutro Abs: 3.7 10*3/uL (ref 1.7–7.7)
Neutrophils Relative %: 41 %
Platelet Count: 309 10*3/uL (ref 150–400)
RBC: 4.14 MIL/uL (ref 3.87–5.11)
RDW: 12.9 % (ref 11.5–15.5)
WBC Count: 9.1 10*3/uL (ref 4.0–10.5)
nRBC: 0 % (ref 0.0–0.2)

## 2022-11-12 MED ORDER — OCTREOTIDE ACETATE 20 MG IM KIT
20.0000 mg | PACK | Freq: Once | INTRAMUSCULAR | Status: AC
  Administered 2022-11-12: 20 mg via INTRAMUSCULAR
  Filled 2022-11-12: qty 1

## 2022-11-12 MED ORDER — HEPARIN SOD (PORK) LOCK FLUSH 100 UNIT/ML IV SOLN: 500.0000 [IU] | Freq: Once | INTRAVENOUS | Status: DC | PRN

## 2022-11-12 MED ORDER — SODIUM CHLORIDE 0.9% FLUSH: 10.0000 mL | INTRAVENOUS | Status: DC | PRN

## 2022-11-12 NOTE — Patient Instructions (Signed)
Octreotide Injection Solution What is this medication? OCTREOTIDE (ok TREE oh tide) treats high levels of growth hormone (acromegaly). It works by reducing the amount of growth hormone your body makes. This reduces symptoms and the risk of health problems caused by too much growth hormone, such as diabetes and heart disease. It may also be used to treat diarrhea caused by neuroendocrine tumors. It works by slowing down the release of serotonin from the tumor cells. This reduces the number of bowel movements you have. This medicine may be used for other purposes; ask your health care provider or pharmacist if you have questions. COMMON BRAND NAME(S): Bynfezia, Sandostatin What should I tell my care team before I take this medication? They need to know if you have any of these conditions: Diabetes Gallbladder disease Kidney disease Liver disease Thyroid disease An unusual or allergic reaction to octreotide, other medications, foods, dyes, or preservatives Pregnant or trying to get pregnant Breast-feeding How should I use this medication? This medication is injected under the skin or into a vein. It is usually given by your care team in a hospital or clinic setting. If you get this medication at home, you will be taught how to prepare and give it. Use exactly as directed. Take it as directed on the prescription label at the same time every day. Keep taking it unless your care team tells you to stop. Allow the injection solution to come to room temperature before use. Do not warm it artificially. It is important that you put your used needles and syringes in a special sharps container. Do not put them in a trash can. If you do not have a sharps container, call your pharmacist or care team to get one. Talk to your care team about the use of this medication in children. Special care may be needed. Overdosage: If you think you have taken too much of this medicine contact a poison control center or  emergency room at once. NOTE: This medicine is only for you. Do not share this medicine with others. What if I miss a dose? If you miss a dose, take it as soon as you can. If it is almost time for your next dose, take only that dose. Do not take double or extra doses. What may interact with this medication? Bromocriptine Certain medications for blood pressure, heart disease, irregular heartbeat Cyclosporine Diuretics Medications for diabetes, including insulin Quinidine This list may not describe all possible interactions. Give your health care provider a list of all the medicines, herbs, non-prescription drugs, or dietary supplements you use. Also tell them if you smoke, drink alcohol, or use illegal drugs. Some items may interact with your medicine. What should I watch for while using this medication? Visit your care team for regular checks on your progress. Tell your care team if your symptoms do not start to get better or if they get worse. To help reduce irritation at the injection site, use a different site for each injection and make sure the solution is at room temperature before use. This medication may cause decreases in blood sugar. Signs of low blood sugar include chills, cool, pale skin or cold sweats, drowsiness, extreme hunger, fast heartbeat, headache, nausea, nervousness or anxiety, shakiness, trembling, unsteadiness, tiredness, or weakness. Contact your care team right away if you experience any of these symptoms. This medication may increase blood sugar. The risk may be higher in patients who already have diabetes. Ask your care team what you can do to lower your   risk of diabetes while taking this medication. You should make sure you get enough vitamin B12 while you are taking this medication. Discuss the foods you eat and the vitamins you take with your care team. What side effects may I notice from receiving this medication? Side effects that you should report to your care  team as soon as possible: Allergic reactions--skin rash, itching, hives, swelling of the face, lips, tongue, or throat Gallbladder problems--severe stomach pain, nausea, vomiting, fever Heart rhythm changes--fast or irregular heartbeat, dizziness, feeling faint or lightheaded, chest pain, trouble breathing High blood sugar (hyperglycemia)--increased thirst or amount of urine, unusual weakness or fatigue, blurry vision Low blood sugar (hypoglycemia)--tremors or shaking, anxiety, sweating, cold or clammy skin, confusion, dizziness, rapid heartbeat Low thyroid levels (hypothyroidism)--unusual weakness or fatigue, increased sensitivity to cold, constipation, hair loss, dry skin, weight gain, feelings of depression Low vitamin B12 level--pain, tingling, or numbness in the hands or feet, muscle weakness, dizziness, confusion, trouble concentrating Pancreatitis--severe stomach pain that spreads to your back or gets worse after eating or when touched, fever, nausea, vomiting Side effects that usually do not require medical attention (report to your care team if they continue or are bothersome): Diarrhea Dizziness Gas Headache Pain, redness, or irritation at injection site Stomach pain This list may not describe all possible side effects. Call your doctor for medical advice about side effects. You may report side effects to FDA at 1-800-FDA-1088. Where should I keep my medication? Keep out of the reach of children and pets. Store in the refrigerator. Protect from light. Allow to come to room temperature naturally. Do not use artificial heat. If protected from light, the injection may be stored between 20 and 30 degrees C (70 and 86 degrees F) for 14 days. After the initial use, throw away any unused portion of a multiple dose vial after 14 days. Get rid of any unused portions of the ampules after use. To get rid of medications that are no longer needed or have expired: Take the medication to a medication  take-back program. Ask your pharmacy or law enforcement to find a location. If you cannot return the medication, ask your pharmacist or care team how to get rid of the medication safely. NOTE: This sheet is a summary. It may not cover all possible information. If you have questions about this medicine, talk to your doctor, pharmacist, or health care provider.  2024 [employer]vier/Gold Standard (2021-07-18 00:00:00)  

## 2022-11-12 NOTE — Assessment & Plan Note (Addendum)
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  Seven of eighteen lymph nodes were involved (7/18).  Primary tumor measured 2.2 x 1.7 x 1.5 cm, with three apparent vascular metastases including a nodule up to 3 cm with metastatic tumor present within 1 mm of the inked radial margin.  In the distal appendix there was also a separate 3 mm carcinoid tumor.  This was a staged as a pT3pN1 with low mitotic rate (0 per 10 high powered fields).  [name] was sporadic.  We began seeing her in February 2022 for carcinoid syndrome symptoms.  Her chromogranin A was elevated at 262.  CT chest, abdomen and pelvis did not reveal any evidence of recurrent or metastatic disease.  She was started on octreotide every 4 weeks.  She had normalization of her chromogranin A and this has remained normal.  Routine CT imaging has remained without evidence of recurrent or metastatic disease, most recently in May 2024.  I once again explained about her disease, the blood test monitoring and the octreotide injections, which can control symptoms of flushing, as well as suppress tumor growth to the patient and her son.  I also gave her written information about carcinoid.  They both expressed understanding and denied further questions.  She is having continued abdominal pain, nausea and decreased appetite, which I do not feel is related to the carcinoid.  I advised her to take the Linzess daily to more frequent bowel movements, as this may help her symptoms.  As she does not know if she has seen Dr. Jennye Boroughs, we will check on an appointment for her.  She will proceed with octreotide this week.  We will plan to see her back in 1 month with a CBC, comprehensive metabolic panel and chromogranin A prior to her next octreotide.

## 2022-11-12 NOTE — Assessment & Plan Note (Addendum)
She saw Dr. Nedra Hai, neurology, [facility] in October 2023.  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  The patient is not sure if she ever tried the pregabalin he recommended or the Celebrex and change to tizanidine recommended during hospitalization at [facility] in February.

## 2022-11-12 NOTE — Assessment & Plan Note (Signed)
Osteomyelitis in June 2023, status post prolonged antibiotic treatment.  There has been chronic changes on MRI since that time, most recently consistent with resolving osteomyelitis.

## 2022-11-12 NOTE — Assessment & Plan Note (Signed)
Pulmonary embolism felt to be provoked in May 2023.  She was given IV heparin overnight and discharged on Eliquis.  It is unclear how long she took Eliquis for.  There is no indication to resume Eliquis at this time.

## 2022-11-13 ENCOUNTER — Other Ambulatory Visit: Payer: Self-pay

## 2022-11-13 ENCOUNTER — Telehealth: Payer: Self-pay

## 2022-11-13 DIAGNOSIS — C7A012 Malignant carcinoid tumor of the ileum: Secondary | ICD-10-CM | POA: Diagnosis not present

## 2022-11-13 NOTE — Telephone Encounter (Signed)
Referral faxed to [facility].

## 2022-11-13 NOTE — Addendum Note (Signed)
Addended by: Lianne Bushy on: 11/13/2022 02:40 PM   Modules accepted: Orders

## 2022-11-13 NOTE — Telephone Encounter (Signed)
-----   Message from Adah Perl sent at 11/12/2022  3:59 PM EDT ----- I think she didn't want to see Chales Abrahams because he's in [place], I asked about him today too. I really don't know what to do other than to refer to Dr. Jennye Boroughs. Thanks ----- Message ----- From: Lianne Bushy, LPN Sent: 1/61/0960   2:49 PM EDT To: Adah Perl, PA-C  I spoke with [facility] they have never seen her. Clelia Croft put in a referral to DR Chales Abrahams back in April they reached out to her twice and left a message for her to call an schedule an appointment she never did. ----- Message ----- From: Adah Perl, PA-C Sent: 11/12/2022   1:27 PM EDT To: Lianne Bushy, LPN  Would you mind calling Dr. Camila Li office and seeing if she has ever seen him?  Thank you

## 2022-11-18 LAB — CHROMOGRANIN A: Chromogranin A (ng/mL): 39.9 ng/mL (ref 0.0–101.8)

## 2022-12-03 ENCOUNTER — Encounter: Payer: Self-pay | Admitting: Oncology

## 2022-12-08 ENCOUNTER — Other Ambulatory Visit: Payer: Medicare HMO

## 2022-12-09 ENCOUNTER — Other Ambulatory Visit: Payer: Medicare HMO

## 2022-12-09 ENCOUNTER — Ambulatory Visit: Payer: Medicare HMO | Admitting: Oncology

## 2022-12-10 ENCOUNTER — Inpatient Hospital Stay: Payer: Medicare HMO | Attending: Hematology and Oncology

## 2022-12-10 VITALS — BP 149/77 | HR 56 | Temp 98.2°F | Resp 18 | Ht 64.0 in | Wt 184.0 lb

## 2022-12-10 DIAGNOSIS — C7A012 Malignant carcinoid tumor of the ileum: Secondary | ICD-10-CM | POA: Diagnosis present

## 2022-12-10 DIAGNOSIS — Z79899 Other long term (current) drug therapy: Secondary | ICD-10-CM | POA: Diagnosis not present

## 2022-12-10 MED ORDER — OCTREOTIDE ACETATE 20 MG IM KIT
20.0000 mg | PACK | Freq: Once | INTRAMUSCULAR | Status: AC
  Administered 2022-12-10: 20 mg via INTRAMUSCULAR
  Filled 2022-12-10: qty 1

## 2022-12-10 NOTE — Patient Instructions (Signed)
Octreotide Injection Solution What is this medication? OCTREOTIDE (ok TREE oh tide) treats high levels of growth hormone (acromegaly). It works by reducing the amount of growth hormone your body makes. This reduces symptoms and the risk of health problems caused by too much growth hormone, such as diabetes and heart disease. It may also be used to treat diarrhea caused by neuroendocrine tumors. It works by slowing down the release of serotonin from the tumor cells. This reduces the number of bowel movements you have. This medicine may be used for other purposes; ask your health care provider or pharmacist if you have questions. COMMON BRAND NAME(S): Bynfezia, Sandostatin What should I tell my care team before I take this medication? They need to know if you have any of these conditions: Diabetes Gallbladder disease Kidney disease Liver disease Thyroid disease An unusual or allergic reaction to octreotide, other medications, foods, dyes, or preservatives Pregnant or trying to get pregnant Breast-feeding How should I use this medication? This medication is injected under the skin or into a vein. It is usually given by your care team in a hospital or clinic setting. If you get this medication at home, you will be taught how to prepare and give it. Use exactly as directed. Take it as directed on the prescription label at the same time every day. Keep taking it unless your care team tells you to stop. Allow the injection solution to come to room temperature before use. Do not warm it artificially. It is important that you put your used needles and syringes in a special sharps container. Do not put them in a trash can. If you do not have a sharps container, call your pharmacist or care team to get one. Talk to your care team about the use of this medication in children. Special care may be needed. Overdosage: If you think you have taken too much of this medicine contact a poison control center or  emergency room at once. NOTE: This medicine is only for you. Do not share this medicine with others. What if I miss a dose? If you miss a dose, take it as soon as you can. If it is almost time for your next dose, take only that dose. Do not take double or extra doses. What may interact with this medication? Bromocriptine Certain medications for blood pressure, heart disease, irregular heartbeat Cyclosporine Diuretics Medications for diabetes, including insulin Quinidine This list may not describe all possible interactions. Give your health care provider a list of all the medicines, herbs, non-prescription drugs, or dietary supplements you use. Also tell them if you smoke, drink alcohol, or use illegal drugs. Some items may interact with your medicine. What should I watch for while using this medication? Visit your care team for regular checks on your progress. Tell your care team if your symptoms do not start to get better or if they get worse. To help reduce irritation at the injection site, use a different site for each injection and make sure the solution is at room temperature before use. This medication may cause decreases in blood sugar. Signs of low blood sugar include chills, cool, pale skin or cold sweats, drowsiness, extreme hunger, fast heartbeat, headache, nausea, nervousness or anxiety, shakiness, trembling, unsteadiness, tiredness, or weakness. Contact your care team right away if you experience any of these symptoms. This medication may increase blood sugar. The risk may be higher in patients who already have diabetes. Ask your care team what you can do to lower your   risk of diabetes while taking this medication. You should make sure you get enough vitamin B12 while you are taking this medication. Discuss the foods you eat and the vitamins you take with your care team. What side effects may I notice from receiving this medication? Side effects that you should report to your care  team as soon as possible: Allergic reactions--skin rash, itching, hives, swelling of the face, lips, tongue, or throat Gallbladder problems--severe stomach pain, nausea, vomiting, fever Heart rhythm changes--fast or irregular heartbeat, dizziness, feeling faint or lightheaded, chest pain, trouble breathing High blood sugar (hyperglycemia)--increased thirst or amount of urine, unusual weakness or fatigue, blurry vision Low blood sugar (hypoglycemia)--tremors or shaking, anxiety, sweating, cold or clammy skin, confusion, dizziness, rapid heartbeat Low thyroid levels (hypothyroidism)--unusual weakness or fatigue, increased sensitivity to cold, constipation, hair loss, dry skin, weight gain, feelings of depression Low vitamin B12 level--pain, tingling, or numbness in the hands or feet, muscle weakness, dizziness, confusion, trouble concentrating Pancreatitis--severe stomach pain that spreads to your back or gets worse after eating or when touched, fever, nausea, vomiting Side effects that usually do not require medical attention (report to your care team if they continue or are bothersome): Diarrhea Dizziness Gas Headache Pain, redness, or irritation at injection site Stomach pain This list may not describe all possible side effects. Call your doctor for medical advice about side effects. You may report side effects to FDA at 1-800-FDA-1088. Where should I keep my medication? Keep out of the reach of children and pets. Store in the refrigerator. Protect from light. Allow to come to room temperature naturally. Do not use artificial heat. If protected from light, the injection may be stored between 20 and 30 degrees C (70 and 86 degrees F) for 14 days. After the initial use, throw away any unused portion of a multiple dose vial after 14 days. Get rid of any unused portions of the ampules after use. To get rid of medications that are no longer needed or have expired: Take the medication to a medication  take-back program. Ask your pharmacy or law enforcement to find a location. If you cannot return the medication, ask your pharmacist or care team how to get rid of the medication safely. NOTE: This sheet is a summary. It may not cover all possible information. If you have questions about this medicine, talk to your doctor, pharmacist, or health care provider.  2024 [employer]vier/Gold Standard (2021-07-18 00:00:00)  

## 2023-01-02 ENCOUNTER — Ambulatory Visit: Payer: Medicare HMO | Admitting: Hematology and Oncology

## 2023-01-02 ENCOUNTER — Other Ambulatory Visit: Payer: Medicare HMO

## 2023-01-05 ENCOUNTER — Other Ambulatory Visit: Payer: Medicare HMO

## 2023-01-06 ENCOUNTER — Ambulatory Visit: Payer: Medicare HMO | Admitting: Hematology and Oncology

## 2023-01-07 ENCOUNTER — Inpatient Hospital Stay: Payer: Medicare HMO | Attending: Hematology and Oncology

## 2023-01-07 VITALS — BP 154/71 | HR 57 | Temp 98.4°F

## 2023-01-07 DIAGNOSIS — Z79899 Other long term (current) drug therapy: Secondary | ICD-10-CM | POA: Diagnosis not present

## 2023-01-07 DIAGNOSIS — C7A012 Malignant carcinoid tumor of the ileum: Secondary | ICD-10-CM | POA: Insufficient documentation

## 2023-01-07 MED ORDER — OCTREOTIDE ACETATE 20 MG IM KIT
20.0000 mg | PACK | Freq: Once | INTRAMUSCULAR | Status: AC
  Administered 2023-01-07: 20 mg via INTRAMUSCULAR
  Filled 2023-01-07: qty 1

## 2023-01-07 NOTE — Patient Instructions (Signed)
Octreotide Injection Solution What is this medication? OCTREOTIDE (ok TREE oh tide) treats high levels of growth hormone (acromegaly). It works by reducing the amount of growth hormone your body makes. This reduces symptoms and the risk of health problems caused by too much growth hormone, such as diabetes and heart disease. It may also be used to treat diarrhea caused by neuroendocrine tumors. It works by slowing down the release of serotonin from the tumor cells. This reduces the number of bowel movements you have. This medicine may be used for other purposes; ask your health care provider or pharmacist if you have questions. COMMON BRAND NAME(S): Bynfezia, Sandostatin What should I tell my care team before I take this medication? They need to know if you have any of these conditions: Diabetes Gallbladder disease Kidney disease Liver disease Thyroid disease An unusual or allergic reaction to octreotide, other medications, foods, dyes, or preservatives Pregnant or trying to get pregnant Breast-feeding How should I use this medication? This medication is injected under the skin or into a vein. It is usually given by your care team in a hospital or clinic setting. If you get this medication at home, you will be taught how to prepare and give it. Use exactly as directed. Take it as directed on the prescription label at the same time every day. Keep taking it unless your care team tells you to stop. Allow the injection solution to come to room temperature before use. Do not warm it artificially. It is important that you put your used needles and syringes in a special sharps container. Do not put them in a trash can. If you do not have a sharps container, call your pharmacist or care team to get one. Talk to your care team about the use of this medication in children. Special care may be needed. Overdosage: If you think you have taken too much of this medicine contact a poison control center or  emergency room at once. NOTE: This medicine is only for you. Do not share this medicine with others. What if I miss a dose? If you miss a dose, take it as soon as you can. If it is almost time for your next dose, take only that dose. Do not take double or extra doses. What may interact with this medication? Bromocriptine Certain medications for blood pressure, heart disease, irregular heartbeat Cyclosporine Diuretics Medications for diabetes, including insulin Quinidine This list may not describe all possible interactions. Give your health care provider a list of all the medicines, herbs, non-prescription drugs, or dietary supplements you use. Also tell them if you smoke, drink alcohol, or use illegal drugs. Some items may interact with your medicine. What should I watch for while using this medication? Visit your care team for regular checks on your progress. Tell your care team if your symptoms do not start to get better or if they get worse. To help reduce irritation at the injection site, use a different site for each injection and make sure the solution is at room temperature before use. This medication may cause decreases in blood sugar. Signs of low blood sugar include chills, cool, pale skin or cold sweats, drowsiness, extreme hunger, fast heartbeat, headache, nausea, nervousness or anxiety, shakiness, trembling, unsteadiness, tiredness, or weakness. Contact your care team right away if you experience any of these symptoms. This medication may increase blood sugar. The risk may be higher in patients who already have diabetes. Ask your care team what you can do to lower your   risk of diabetes while taking this medication. You should make sure you get enough vitamin B12 while you are taking this medication. Discuss the foods you eat and the vitamins you take with your care team. What side effects may I notice from receiving this medication? Side effects that you should report to your care  team as soon as possible: Allergic reactions--skin rash, itching, hives, swelling of the face, lips, tongue, or throat Gallbladder problems--severe stomach pain, nausea, vomiting, fever Heart rhythm changes--fast or irregular heartbeat, dizziness, feeling faint or lightheaded, chest pain, trouble breathing High blood sugar (hyperglycemia)--increased thirst or amount of urine, unusual weakness or fatigue, blurry vision Low blood sugar (hypoglycemia)--tremors or shaking, anxiety, sweating, cold or clammy skin, confusion, dizziness, rapid heartbeat Low thyroid levels (hypothyroidism)--unusual weakness or fatigue, increased sensitivity to cold, constipation, hair loss, dry skin, weight gain, feelings of depression Low vitamin B12 level--pain, tingling, or numbness in the hands or feet, muscle weakness, dizziness, confusion, trouble concentrating Pancreatitis--severe stomach pain that spreads to your back or gets worse after eating or when touched, fever, nausea, vomiting Side effects that usually do not require medical attention (report to your care team if they continue or are bothersome): Diarrhea Dizziness Gas Headache Pain, redness, or irritation at injection site Stomach pain This list may not describe all possible side effects. Call your doctor for medical advice about side effects. You may report side effects to FDA at 1-800-FDA-1088. Where should I keep my medication? Keep out of the reach of children and pets. Store in the refrigerator. Protect from light. Allow to come to room temperature naturally. Do not use artificial heat. If protected from light, the injection may be stored between 20 and 30 degrees C (70 and 86 degrees F) for 14 days. After the initial use, throw away any unused portion of a multiple dose vial after 14 days. Get rid of any unused portions of the ampules after use. To get rid of medications that are no longer needed or have expired: Take the medication to a medication  take-back program. Ask your pharmacy or law enforcement to find a location. If you cannot return the medication, ask your pharmacist or care team how to get rid of the medication safely. NOTE: This sheet is a summary. It may not cover all possible information. If you have questions about this medicine, talk to your doctor, pharmacist, or health care provider.  2024 [employer]vier/Gold Standard (2021-07-18 00:00:00)  

## 2023-02-03 NOTE — Progress Notes (Signed)
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  Seven of eighteen lymph nodes were involved (7/18).  Primary tumor measured 2.2 x 1.7 x 1.5 cm, with three apparent vascular metastases including a nodule up to 3 cm with metastatic tumor present within 1 mm of the inked radial margin.  In the distal appendix there was also a separate 3 mm carcinoid tumor.  This was a staged as a pT3pN1 with low mitotic rate (0 per 10 high powered fields).  [name] was sporadic.  We began seeing her in February 2022 for carcinoid syndrome symptoms.  Her chromogranin A was elevated at 262.  CT chest, abdomen and pelvis did not reveal any evidence of recurrent or metastatic disease.  She was started on octreotide every 4 weeks.  She had normalization of her chromogranin A and this has remained normal.  Routine CT imaging has remained without evidence of recurrent or metastatic disease, most recently in May 2024.  I once again explained about her disease, the blood test monitoring and the octreotide injections, which can control symptoms of flushing, as well as suppress tumor growth to the patient and her son.  I also gave him written information about carcinoid.  This time I went through the full explanation with her daughter-in-law and answered her questions.  I explained that she is not on chemotherapy, but Sandostatin, a form of hormonal therapy.    Neuropathy She saw Dr. Nedra Romero, neurology, [facility] in October 2023.  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  The patient is not sure if she ever tried the pregabalin he recommended or the Celebrex and change to tizanidine recommended during hospitalization at [facility] in February.   History of osteomyelitis Osteomyelitis in June 2023, status post prolonged antibiotic treatment.  There has been chronic changes on MRI since that time, most recently consistent with resolving osteomyelitis.   History of pulmonary embolism Pulmonary embolism felt to be provoked in May 2023.  She was given IV heparin overnight and discharged on Eliquis.  It is unclear how long she took Eliquis for.  There is no indication to resume Eliquis at this time.     Plan:  Her daughter-in-law inquired about why she is receiving "chemo" treatment for "colon cancer" as Kristi Romero's PCP questioned why she was receiving treatment. I explained the diagnosis of carcinoid and carcinoid syndrome along with the prognosis and treatment. She receives Sandostatin every 4 weeks, which is not chemotherapy but a form of hormonal therapy to keep this under control to her and [place]. I will send a copy of her records to her PCP. I also explained that she has neuropathy, bone spurs, osteoarthritis, and degenerative discs disease pressing against her nerve which contributes to her chronic pain of her lower extremities. Her labs today are pending.  She will continue to receive Sandostatin injections every 4 weeks.  I will see her in 13 weeks with CBC and CMP. The patient understands the plans discussed today and is in agreement with them.  She knows to contact our office  if she develops concerns prior to her next appointment.  I provided 20 minutes of face-to-face time during this encounter and > 50% was spent counseling as documented under my assessment and plan.    Kristi Beckwith, MD  [facility] AT [facility] [address] [place] [address] Dept:  [phone] Dept Fax: [phone]   No orders of the defined types were placed in this encounter.     CHIEF COMPLAINT:  CC: Malignant carcinoid   Current Treatment: Octreotide every 4 weeks  HISTORY OF PRESENT ILLNESS:  Kristi Romero is a 67 year old. female who we began seeing in in February 2022 at the request of Dr. Charm Romero.  She had a history of malignant carcinoid (T3 N2 M0) of the small bowel diagnosed in 2016.  CT abdomen revealed potentially hypervascular masslike lesion anterior to the right psoas muscle in the right lower quadrant measuring 2.6 x 2 cm, as well as a hypervascular mass in the ascending colon adjacent to the ileocecal valve concerning for mesenteric metastatic disease.  She was treated with right hemicolectomy.  Pathology revealed well-differentiated neuroendocrine tumor of the ileum measuring 2.2 x 1.7 x 1.5 cm with 7 of 18 lymph nodes positive for metastatic carcinoma with a low mitotic rate. Three apparent vascular metastases, with a nodule up to 3.0 cm in diameter in addition to the lymph nodes. Apparently separate 0.3 cm carcinoid tumor in the distal appendix.  Metastatic tumor present within 1 mm of inked radial margin. Endoscopy in November 2021 revealed chronic gastritis and was negative for H.Pylori.  No evidence of malignancy was observed.  When we began seeing her, she reported hot flashes, which we felt represented flushing from carcinoid syndrome.  She also reported bilateral numbness "from her mouth to her feet". She was on B12 injections every 6 months. Chromogranin A was elevated at 262.3. She also had an elevated serum protein. Serum protein electrophoresis did not reveal a monoclonal spike. CT imaging revealed and adrenal adenomas, but no obvious evidence of recurrent/progressive disease. She was placed on monthly octreotide injections in March 2022 with good response based on her decreasing chromogranin A.  CT chest, abdomen and pelvis in August revealed  postoperative changes of right hemicolectomy with enterocolonic anastomosis without evidence of local recurrence or abdominopelvic metastatic disease.  Stable left adrenal gland adenoma.  MRI scan of the brain in August revealed evidence of chronic ischemia. She had normalization of the Chromogranin A in April. Chromogranin A from August was 48.8 down from 61.4 in April. The chromogranin A was 51 in September.  She continued octreotide every 4 weeks.    She was admitted from the ER at [facility] in early May 2023 with sepsis.  She presented with shortness of breath, abdominal pain, nausea, vomiting and diarrhea. CTA chest and CT abdomen/pelvis did not reveal any evidence of pulmonary emboli, or current or metastatic carcinoid.  Stable left adrenal adenoma.  CT head/cervical spine at that time did not reveal any acute intracranial abnormality.  Multilevel degenerative changes in the cervical spine with prior C5-6 spinal fusion.  No acute osseous findings are seen.  There was an incidental finding of a 2.1 cm left thyroid nodule, thyroid ultrasound recommended.  The CT images were suboptimal due to motion degradation.   Chest x-ray later revealed left lower lobe pneumonia.  Thyroid ultrasound revealed a 1.8 cm left mid thyroid nodule meeting criteria for follow-up in 1 year.  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  It was recommended she follow-up with GI after discharge.  In mid May, she presented to [facility] and was found to have a small subsegmental nonocclusive right lower lobe pulmonary embolism.  She was admitted overnight and placed on on IV heparin.  She was then discharged on Eliquis.  She was seen again in the ER at [facility] in June for abdominal pain.  CT abdomen and pelvis in June 2023 revealed increased size of lytic lesions and reactive sclerosis involving T10-11 vertebral body suspicious for infectious discitis.  MRI cervical, thoracic and lumbar  spine revealed bone marrow edema in the T1 and T2 vertebral bodies and to lesser extent superior aspect of the T3 vertebral body with fluid signal in the T2-3 disc space. Fluid signal in the T10-11 disc space with bone marrow edema throughout the T10 and T11 vertebral bodies. These findings were most concerning for discitis-osteomyelitis.  She was admitted and treated with broad-spectrum antibiotics.  Blood cultures were negative.  Biopsy of T11 did not reveal evidence of acute osteomyelitis or malignancy.   She had a PICC line placed and was treated with 6 weeks of Rocephin 2 grams daily, which she completed in July.   She was seen in the ER in late July for worsening lower extremity pain and paresthesias.  Lateral lower extremity venous Doppler ultrasound did not reveal any evidence of deep venous thrombosis.  It was recommended she continue gabapentin with Percocet as needed, as previously prescribed.  She apparently presented to the [facility] ER again in mid August with same complaints, but left before being seen.  She was seen in the ER at [facility] a couple of days later.  They concluded her symptoms were related to neuropathy and recommended continuing gabapentin, 600 mg 3 times a day, as well as increasing your baclofen to 10 mg 3 times a day. CT abdomen and pelvis in August did not reveal any evidence of recurrent or metastatic carcinoid.  Stable small benign left adrenal adenoma.  Stable appearance of chronic discitis at T10-11.  She had been seen in the ER with worsening paresthesias on multiple occasions.  She saw neurology in October 2023 for numbness and tingling in both hands and up to her elbows, as well as both feet up to her legs which had been going on for several months, and was associated with diminished grip strength, per neurology suspected multifactorial from sensory neuropathy noted on a previous EMG and myelomalacia in the cervical spinal cord after previous neck surgery,  though spinal instability from osteomyelitis could not be ruled out.  She was started on pregabalin at that time, however, it is uncertain if she started pregabalin as she has continued gabapentin.  MRI thoracic spine in November done by orthopedic surgery revealed edematous endplate marrow changes at T10-11 with T2 hyperintensity in the disc space. Mild prominence of paraspinous soft tissues noted. Findings are concerning for early disc osteomyelitis.  There is also bilateral foraminal narrowing at T10 11% on the right, and mild right foraminal narrowing at T3-4, T4-5, T6-7, T9-10, and T11-12 with mild left foraminal narrowing at T2-3.  She was apparently referred to [facility] by orthopedic surgery.  She was hospitalized with worsening low back pain and inability to ambulate.  MRI thoracic spine revealed edema and enhancement involving the C7-T3 and T10-T11 vertebral bodies as well as the intervening disc spaces. There is also abnormal soft tissue enhancement in the surrounding soft tissues at these levels.  These findings were favored to represent discitis-osteomyelitis at C7-T3 and T10-T11 with phlegmonous changes in the surrounding soft tissues. No evidence of epidural abscess.  Repeat blood cultures were negative.  She was discharged on a 2-week course of cefdinir.   She was hospitalized at [facility] with worsening back pain and paresthesias again in February with suspected discitis/osteomyelitis.  MRI cervical, thoracic and lumbar spine revealed changes favored to represent discitis-osteomyelitis at C7-T3 and T10-T11 with phlegmonous changes in the surrounding soft tissues.  There was no evidence of epidural abscess.  She underwent biopsy at T10-T11 and culture was negative, so she was not felt to current discitis/osteomyelitis.  Celebrex added and home gabapentin increased to 900 TID for pain, as well as changing baclofen to tizanidine with improved pain control also recommended on discharge.  She does not  appear to have started this regimen to either.  During her hospitalization GI referral was recommended for colonoscopy due to her history of Streptococcus gallolyticus bacteremia.  She had an appointment with GI at [facility], but requested a local referral.  We referred her to Dr. Jennye Boroughs in May.  The chromogranin A remains normal, so we have continued octreotide every 4 weeks.  Oncology History  Malignant carcinoid tumor of ileum (HCC)  04/28/2014 Initial Diagnosis   Malignant carcinoid tumor of ileum (HCC)   02/02/2015 Cancer Staging   Staging form: Neuroendocrine Tumor - Duodenum/Ampulla/Jejunum/Illeum, AJCC 7th Edition - Clinical stage from 02/02/2015: Stage IIIB (T3(3), N1, M0) - Signed by Kristi Beckwith, MD on 05/28/2020 Staged by: Managing physician Diagnostic confirmation: Positive histology Specimen type: Excision Histopathologic type: Carcinoid tumor, NOS Stage prefix: Initial diagnosis Tumor size (mm): 22 Multiple tumors: Yes Number of tumors: 3 Histologic grade (G): G1 Lymph-vascular invasion (LVI): LVI not present (absent)/not identified Residual tumor (R): R0 - None Stage used in treatment planning: Yes National guidelines used in treatment planning: Yes Type of national guideline used in treatment planning: NCCN Staging comments: Surgical resection       INTERVAL HISTORY:  Kristi Romero is here today for repeat clinical assessment for carcinoid syndrome. Patient states that she feels ok but complains of nausea, dizziness, chronic pain everywhere, and the bottom of her feet hurt to walk. Her daughter-in-law inquired about why she is receiving "chemo" treatment for "colon cancer" as Kristi Romero's PCP questioned why she was still receiving treatment. I explained the diagnosis of carcinoid and carcinoid syndrome along with the prognosis and treatment. She receives Sandostatin every 4 weeks, which is not chemotherapy but a form of hormonal therapy to keep this under control to her and  [place].  She had stage IIIb at the time of her surgery with 3 lesions and nodal involvement.  I will send a copy of her records to her PCP. I also explained that she has neuropathy, bone spurs, osteoarthritis, and degenerative discs disease pressing against her nerve which contributes to her chronic pain of her lower extremities.  We will continue the Sandostatin every 4 weeks.  Her labs today are pending. I will see her in 13 weeks with CBC and CMP. She denies signs of infection such as sore throat, sinus drainage, cough, or urinary symptoms.  She denies fevers or recurrent chills. She denies pain. She denies vomiting, chest pain, dyspnea or cough. Her appetite is poor as she has nausea and her weight has increased 9 pounds over last 2 month . She is accompanied with her daughter-in-law at today's visit.   REVIEW OF SYSTEMS:  Review of Systems  Constitutional:  Positive for appetite change and fatigue. Negative for chills, diaphoresis, fever and unexpected weight change.  HENT:  Negative.  Negative for hearing loss, lump/mass, mouth sores, nosebleeds, sore throat, tinnitus, trouble swallowing and voice change.   Eyes: Negative.  Negative for eye problems and icterus.  Respiratory: Negative.  Negative for chest tightness, cough, hemoptysis, shortness of breath and wheezing.   Cardiovascular:  Positive for leg swelling. Negative for chest pain and palpitations.  Gastrointestinal:  Positive for abdominal distention, abdominal pain, constipation and nausea. Negative for blood in stool, diarrhea, rectal pain and vomiting.  Endocrine: Positive for hot flashes (during the night).  Genitourinary: Negative.  Negative for bladder incontinence, difficulty urinating, dyspareunia, dysuria, frequency, hematuria, menstrual problem, nocturia, pelvic pain, vaginal bleeding and vaginal discharge.   Musculoskeletal:  Positive for gait problem. Negative for arthralgias, back pain, flank pain, myalgias, neck pain and neck  stiffness.  Skin: Negative.  Negative for itching, rash and wound.  Neurological:  Positive for extremity weakness, gait problem and numbness. Negative for dizziness, headaches, light-headedness, seizures and speech difficulty.  Hematological: Negative.  Negative for adenopathy. Does not bruise/bleed easily.  Psychiatric/Behavioral: Negative.  Negative for confusion, decreased concentration, depression, sleep disturbance and suicidal ideas. The patient is not nervous/anxious.      VITALS:  Blood pressure (!) 154/81, pulse 68, temperature 98.1 F (36.7 C), temperature source Oral, resp. rate 18, height 5\' 4"  (1.626 m), weight 193 lb 6.4 oz (87.7 kg), SpO2 98%.  Wt Readings from Last 3 Encounters:  02/04/23 193 lb 6.4 oz (87.7 kg)  12/10/22 184 lb (83.5 kg)  11/12/22 187 lb 9.6 oz (85.1 kg)    Body mass index is 33.2 kg/m.  Performance status (ECOG): 2 - Symptomatic, <50% confined to bed  PHYSICAL EXAM:  Physical Exam Vitals and nursing note reviewed.  Constitutional:      General: She is not in acute distress.    Appearance: Normal appearance. She is normal weight. She is not ill-appearing, toxic-appearing or diaphoretic.  HENT:     Head: Normocephalic and atraumatic.     Right Ear: Tympanic membrane, ear canal and external ear normal. There is no impacted cerumen.     Left Ear: Tympanic membrane, ear canal and external ear normal. There is no impacted cerumen.     Nose: Nose normal. No congestion.     Mouth/Throat:     Mouth: Mucous membranes are moist.     Pharynx: Oropharynx is clear. No oropharyngeal exudate or posterior oropharyngeal erythema.  Eyes:     General: No scleral icterus.       Right eye: No discharge.        Left eye: No discharge.     Extraocular Movements: Extraocular movements intact.     Conjunctiva/sclera: Conjunctivae normal.     Pupils: Pupils are equal, round, and reactive to light.  Neck:     Vascular: No carotid bruit.  Cardiovascular:     Rate  and Rhythm: Normal rate and regular rhythm.     Pulses: Normal pulses.     Heart sounds: Normal heart sounds. No murmur heard.    No friction rub. No gallop.  Pulmonary:     Effort: Pulmonary effort is normal. No respiratory distress.     Breath sounds: Normal breath sounds. No stridor. No wheezing, rhonchi or rales.  Chest:     Chest wall: No tenderness.  Abdominal:     General: Bowel sounds are normal. There is no distension.  Palpations: Abdomen is soft. There is no hepatomegaly, splenomegaly or mass.     Tenderness: There is abdominal tenderness. There is no right CVA tenderness, left CVA tenderness, guarding or rebound.     Hernia: No hernia is present.     Comments: Upper mid abdomen  Musculoskeletal:        General: No swelling, tenderness, deformity or signs of injury. Normal range of motion.     Cervical back: Normal range of motion and neck supple. No rigidity or tenderness.     Right lower leg: No edema.     Left lower leg: No edema.  Lymphadenopathy:     Cervical: No cervical adenopathy.     Right cervical: No superficial, deep or posterior cervical adenopathy.    Left cervical: No superficial, deep or posterior cervical adenopathy.     Upper Body:     Right upper body: No supraclavicular, axillary or pectoral adenopathy.     Left upper body: No supraclavicular, axillary or pectoral adenopathy.  Skin:    General: Skin is warm and dry.     Coloration: Skin is not jaundiced or pale.     Findings: No bruising, erythema, lesion or rash.  Neurological:     General: No focal deficit present.     Mental Status: She is alert and oriented to person, place, and time. Mental status is at baseline.     Cranial Nerves: No cranial nerve deficit.     Sensory: No sensory deficit.     Motor: No weakness.     Coordination: Coordination normal.     Gait: Gait normal.     Deep Tendon Reflexes: Reflexes normal.  Psychiatric:        Mood and Affect: Mood normal.        Behavior:  Behavior normal.        Thought Content: Thought content normal.        Judgment: Judgment normal.    LABS:      Latest Ref Rng & Units 02/04/2023    2:10 PM 11/12/2022   10:07 AM 09/17/2022   10:24 AM  CBC  WBC 4.0 - 10.5 K/uL 9.5  9.1  8.7   Hemoglobin 12.0 - 15.0 g/dL 16.1  09.6  04.5   Hematocrit 36.0 - 46.0 % 43.8  40.3  40.5   Platelets 150 - 400 K/uL 244  309  300       Latest Ref Rng & Units 02/04/2023    2:10 PM 11/12/2022   10:07 AM 09/17/2022   10:24 AM  CMP  Glucose 70 - 99 mg/dL 89  91  98   BUN 8 - 23 mg/dL 9  10  10    Creatinine 0.44 - 1.00 mg/dL 4.09  8.11  9.14   Sodium 135 - 145 mmol/L 139  137  139   Potassium 3.5 - 5.1 mmol/L 3.5  3.8  3.3   Chloride 98 - 111 mmol/L 101  101  100   CO2 22 - 32 mmol/L 28  28  31    Calcium 8.9 - 10.3 mg/dL 9.6  9.2  9.2   Total Protein 6.5 - 8.1 g/dL 8.3  7.9  8.0   Total Bilirubin 0.3 - 1.2 mg/dL 0.4  0.3  0.3   Alkaline Phos 38 - 126 U/L 100  94  86   AST 15 - 41 U/L 19  29  15    ALT 0 - 44 U/L 14  26  11  No results found for: "CEA1", "CEA" / No results found for: "CEA1", "CEA" No results found for: "PSA1" No results found for: "CAN199" No results found for: "CAN125"   Lab Results  Component Value Date   TOTALPROTELP 6.6 06/01/2020   ALBUMINELP 3.1 06/01/2020   A1GS 0.3 06/01/2020   A2GS 0.8 06/01/2020   BETS 1.1 06/01/2020   GAMS 1.4 06/01/2020   MSPIKE Not Observed 06/01/2020   SPEI Comment 06/01/2020   Lab Results  Component Value Date   TIBC 244 (L) 09/06/2021   FERRITIN 873 (H) 09/06/2021   IRONPCTSAT 9 (L) 09/06/2021   No results found for: "LDH"  STUDIES:      HISTORY:   Past Medical History:  Diagnosis Date   Abdominal pain, epigastric    Accident occurring in home 05/07/2016   Acute respiratory failure with hypoxia (HCC) 09/06/2021   Adrenal adenoma 03/16/2018   Anxiety and depression 03/16/2018   BMI 26.0-26.9,adult    Cervicogenic headache 10/14/2016   Chest pain,  unspecified    Chronic abdominal pain and nausea  09/06/2021   Chronic back pain    Constipation 04/08/2021   Dizziness 10/14/2016   Elevated lipase 01/13/2017   Formatting of this note might be different from the original. 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   Normocytic anemia 09/06/2021   Numbness    Numbness and tingling 10/14/2016   Pulmonary embolism with acute respiratory failure with hypoxia  09/06/2021   Restless leg syndrome    Sleeping difficulty 04/18/2016   Spondylosis, cervical, 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 Dr Arrie Aran [place]    COLONOSCOPY  09/05/2020   Dr Rayfield Citizen Benign neoplasm of transverse colon. Benign neoplasm of descending colon.   ESOPHAGOGASTRODUODENOSCOPY  02/2018   ESOPHAGOGASTRODUODENOSCOPY (EGD) WITH PROPOFOL N/A 03/16/2018   Procedure: ESOPHAGOGASTRODUODENOSCOPY (EGD) WITH PROPOFOL;  Surgeon: Meryl Dare, MD;  Location: [facility] ENDOSCOPY;  Service: Endoscopy;  Laterality: N/A;   NECK SURGERY     PARTIAL HYSTERECTOMY     TOE SURGERY  Bilateral     Family History  Problem Relation Age of Onset   Lung cancer Father    Heart disease Father    Hypertension Father    Heart disease Mother    Hypertension Mother    Cancer Mother     Social History:  reports that she has quit smoking. She has never used smokeless tobacco. She reports current drug use. Drug: Marijuana. She reports that she does not drink alcohol.The patient is accompanied by her son today.  Allergies:  Allergies  Allergen Reactions   Atorvastatin Anaphylaxis   Codeine Nausea And Vomiting, Nausea Only and Shortness Of Breath    Other reaction(s): GI Upset (intolerance)  Other Reaction(s): upset stomach   Lisinopril Anaphylaxis, Swelling and Other (See Comments)    Other Reaction(s): tongue swells up    Current Medications: Current Outpatient Medications  Medication Sig Dispense Refill   traMADol (ULTRAM) 50 MG tablet Take 50 mg by mouth 2 (two) times daily as needed.     acetaminophen (TYLENOL) 500 MG tablet Take 500 mg by mouth every 6 (six) hours as needed for mild pain.     apixaban (ELIQUIS) 5 MG TABS tablet Take 1 tablet (5 mg total) by mouth 2 (two) times daily. (Patient not taking: Reported on 11/12/2022) 60 tablet 0   baclofen (LIORESAL) 10 MG tablet Take 10 mg by mouth 2 (two) times daily.     diclofenac Sodium (VOLTAREN) 1 % GEL Apply topically.     dicyclomine (BENTYL) 20 MG tablet Take 20 mg by mouth 4 (four) times daily as needed for spasms.     DULoxetine (CYMBALTA) 60 MG capsule Take 60 mg by mouth daily.     famotidine (PEPCID) 20 MG tablet Take 20 mg by mouth 2 (two) times daily.     gabapentin (NEURONTIN) 600 MG tablet Take 600 mg by mouth 3 (three) times daily. Per patient     LINZESS 290 MCG CAPS capsule Take 290 mcg by mouth daily. Patient reports taking as needed     meclizine (ANTIVERT) 25 MG tablet Take 25 mg by mouth 4 (four) times daily as needed for dizziness.     methocarbamol (ROBAXIN) 500 MG tablet Take by mouth.      mirtazapine (REMERON) 15 MG tablet TAKE 1 TABLET AT BEDTIME (Patient not taking: Reported on 11/12/2022) 90 tablet 3   ondansetron (ZOFRAN) 4 MG tablet Take 1 tablet (4 mg total) by mouth every 6 (six) hours. 12 tablet 0   ondansetron (ZOFRAN-ODT) 4 MG disintegrating tablet Take 4 mg by mouth 2 (two) times daily as needed for nausea or vomiting.     pantoprazole (PROTONIX) 40 MG tablet Take 40 mg by mouth daily.     potassium chloride (KLOR-CON M) 10 MEQ tablet Take 1 tablet (10 mEq total) by mouth 2 (two) times daily. 60 tablet 5   predniSONE (DELTASONE) 20 MG tablet Take 20 mg by mouth daily.     pregabalin (LYRICA) 50 MG capsule Take by mouth. (Patient not taking: Reported on 11/12/2022)     rosuvastatin (CRESTOR) 10 MG tablet      sucralfate (CARAFATE) 1 g tablet Take 1 g by mouth daily.     No current facility-administered medications for this visit.     I,Jasmine M Lassiter,acting as a scribe for Kristi Beckwith, MD.,have documented all relevant documentation on the behalf of Kristi Beckwith, MD,as directed by  Kristi Beckwith, MD while in the presence of Kristi Beckwith, MD.

## 2023-02-04 ENCOUNTER — Inpatient Hospital Stay: Payer: Medicare HMO | Attending: Hematology and Oncology | Admitting: Oncology

## 2023-02-04 ENCOUNTER — Inpatient Hospital Stay: Payer: Medicare HMO

## 2023-02-04 ENCOUNTER — Encounter: Payer: Self-pay | Admitting: Oncology

## 2023-02-04 VITALS — BP 154/81 | HR 68 | Temp 98.1°F | Resp 18 | Ht 64.0 in | Wt 193.4 lb

## 2023-02-04 VITALS — BP 164/88 | HR 57 | Temp 97.6°F | Resp 18

## 2023-02-04 DIAGNOSIS — E3400 Carcinoid syndrome, unspecified: Secondary | ICD-10-CM | POA: Insufficient documentation

## 2023-02-04 DIAGNOSIS — C7A012 Malignant carcinoid tumor of the ileum: Secondary | ICD-10-CM | POA: Diagnosis not present

## 2023-02-04 LAB — CBC WITH DIFFERENTIAL (CANCER CENTER ONLY)
Abs Immature Granulocytes: 0.02 10*3/uL (ref 0.00–0.07)
Basophils Absolute: 0 10*3/uL (ref 0.0–0.1)
Basophils Relative: 0 %
Eosinophils Absolute: 0 10*3/uL (ref 0.0–0.5)
Eosinophils Relative: 0 %
HCT: 43.8 % (ref 36.0–46.0)
Hemoglobin: 13.8 g/dL (ref 12.0–15.0)
Immature Granulocytes: 0 %
Lymphocytes Relative: 51 %
Lymphs Abs: 4.7 10*3/uL — ABNORMAL HIGH (ref 0.7–4.0)
MCH: 30.7 pg (ref 26.0–34.0)
MCHC: 31.5 g/dL (ref 30.0–36.0)
MCV: 97.3 fL (ref 80.0–100.0)
Monocytes Absolute: 0.7 10*3/uL (ref 0.1–1.0)
Monocytes Relative: 7 %
Neutro Abs: 4 10*3/uL (ref 1.7–7.7)
Neutrophils Relative %: 42 %
Platelet Count: 244 10*3/uL (ref 150–400)
RBC: 4.5 MIL/uL (ref 3.87–5.11)
RDW: 13.4 % (ref 11.5–15.5)
WBC Count: 9.5 10*3/uL (ref 4.0–10.5)
nRBC: 0 % (ref 0.0–0.2)

## 2023-02-04 LAB — CMP (CANCER CENTER ONLY)
ALT: 14 U/L (ref 0–44)
AST: 19 U/L (ref 15–41)
Albumin: 4 g/dL (ref 3.5–5.0)
Alkaline Phosphatase: 100 U/L (ref 38–126)
Anion gap: 10 (ref 5–15)
BUN: 9 mg/dL (ref 8–23)
CO2: 28 mmol/L (ref 22–32)
Calcium: 9.6 mg/dL (ref 8.9–10.3)
Chloride: 101 mmol/L (ref 98–111)
Creatinine: 0.76 mg/dL (ref 0.44–1.00)
GFR, Estimated: >60 mL/min (ref >60)
Glucose, Bld: 89 mg/dL (ref 70–99)
Potassium: 3.5 mmol/L (ref 3.5–5.1)
Sodium: 139 mmol/L (ref 135–145)
Total Bilirubin: 0.4 mg/dL (ref 0.3–1.2)
Total Protein: 8.3 g/dL — ABNORMAL HIGH (ref 6.5–8.1)

## 2023-02-04 MED ORDER — OCTREOTIDE ACETATE 20 MG IM KIT
20.0000 mg | PACK | Freq: Once | INTRAMUSCULAR | Status: AC
  Administered 2023-02-04: 20 mg via INTRAMUSCULAR
  Filled 2023-02-04: qty 1

## 2023-02-04 NOTE — Patient Instructions (Signed)
Octreotide Injection Solution What is this medication? OCTREOTIDE (ok TREE oh tide) treats high levels of growth hormone (acromegaly). It works by reducing the amount of growth hormone your body makes. This reduces symptoms and the risk of health problems caused by too much growth hormone, such as diabetes and heart disease. It may also be used to treat diarrhea caused by neuroendocrine tumors. It works by slowing down the release of serotonin from the tumor cells. This reduces the number of bowel movements you have. This medicine may be used for other purposes; ask your health care provider or pharmacist if you have questions. COMMON BRAND NAME(S): Bynfezia, Sandostatin What should I tell my care team before I take this medication? They need to know if you have any of these conditions: Diabetes Gallbladder disease Kidney disease Liver disease Thyroid disease An unusual or allergic reaction to octreotide, other medications, foods, dyes, or preservatives Pregnant or trying to get pregnant Breast-feeding How should I use this medication? This medication is injected under the skin or into a vein. It is usually given by your care team in a hospital or clinic setting. If you get this medication at home, you will be taught how to prepare and give it. Use exactly as directed. Take it as directed on the prescription label at the same time every day. Keep taking it unless your care team tells you to stop. Allow the injection solution to come to room temperature before use. Do not warm it artificially. It is important that you put your used needles and syringes in a special sharps container. Do not put them in a trash can. If you do not have a sharps container, call your pharmacist or care team to get one. Talk to your care team about the use of this medication in children. Special care may be needed. Overdosage: If you think you have taken too much of this medicine contact a poison control center or  emergency room at once. NOTE: This medicine is only for you. Do not share this medicine with others. What if I miss a dose? If you miss a dose, take it as soon as you can. If it is almost time for your next dose, take only that dose. Do not take double or extra doses. What may interact with this medication? Bromocriptine Certain medications for blood pressure, heart disease, irregular heartbeat Cyclosporine Diuretics Medications for diabetes, including insulin Quinidine This list may not describe all possible interactions. Give your health care provider a list of all the medicines, herbs, non-prescription drugs, or dietary supplements you use. Also tell them if you smoke, drink alcohol, or use illegal drugs. Some items may interact with your medicine. What should I watch for while using this medication? Visit your care team for regular checks on your progress. Tell your care team if your symptoms do not start to get better or if they get worse. To help reduce irritation at the injection site, use a different site for each injection and make sure the solution is at room temperature before use. This medication may cause decreases in blood sugar. Signs of low blood sugar include chills, cool, pale skin or cold sweats, drowsiness, extreme hunger, fast heartbeat, headache, nausea, nervousness or anxiety, shakiness, trembling, unsteadiness, tiredness, or weakness. Contact your care team right away if you experience any of these symptoms. This medication may increase blood sugar. The risk may be higher in patients who already have diabetes. Ask your care team what you can do to lower your   risk of diabetes while taking this medication. You should make sure you get enough vitamin B12 while you are taking this medication. Discuss the foods you eat and the vitamins you take with your care team. What side effects may I notice from receiving this medication? Side effects that you should report to your care  team as soon as possible: Allergic reactions--skin rash, itching, hives, swelling of the face, lips, tongue, or throat Gallbladder problems--severe stomach pain, nausea, vomiting, fever Heart rhythm changes--fast or irregular heartbeat, dizziness, feeling faint or lightheaded, chest pain, trouble breathing High blood sugar (hyperglycemia)--increased thirst or amount of urine, unusual weakness or fatigue, blurry vision Low blood sugar (hypoglycemia)--tremors or shaking, anxiety, sweating, cold or clammy skin, confusion, dizziness, rapid heartbeat Low thyroid levels (hypothyroidism)--unusual weakness or fatigue, increased sensitivity to cold, constipation, hair loss, dry skin, weight gain, feelings of depression Low vitamin B12 level--pain, tingling, or numbness in the hands or feet, muscle weakness, dizziness, confusion, trouble concentrating Pancreatitis--severe stomach pain that spreads to your back or gets worse after eating or when touched, fever, nausea, vomiting Side effects that usually do not require medical attention (report to your care team if they continue or are bothersome): Diarrhea Dizziness Gas Headache Pain, redness, or irritation at injection site Stomach pain This list may not describe all possible side effects. Call your doctor for medical advice about side effects. You may report side effects to FDA at 1-800-FDA-1088. Where should I keep my medication? Keep out of the reach of children and pets. Store in the refrigerator. Protect from light. Allow to come to room temperature naturally. Do not use artificial heat. If protected from light, the injection may be stored between 20 and 30 degrees C (70 and 86 degrees F) for 14 days. After the initial use, throw away any unused portion of a multiple dose vial after 14 days. Get rid of any unused portions of the ampules after use. To get rid of medications that are no longer needed or have expired: Take the medication to a medication  take-back program. Ask your pharmacy or law enforcement to find a location. If you cannot return the medication, ask your pharmacist or care team how to get rid of the medication safely. NOTE: This sheet is a summary. It may not cover all possible information. If you have questions about this medicine, talk to your doctor, pharmacist, or health care provider.  2024 [employer]vier/Gold Standard (2021-07-18 00:00:00)  

## 2023-02-18 ENCOUNTER — Encounter: Payer: Self-pay | Admitting: Oncology

## 2023-03-04 ENCOUNTER — Inpatient Hospital Stay: Payer: Medicare HMO | Attending: Hematology and Oncology

## 2023-03-04 ENCOUNTER — Telehealth: Payer: Self-pay | Admitting: Hematology and Oncology

## 2023-03-04 NOTE — Telephone Encounter (Signed)
03/04/2023  Patient missed her Appt's today - Unable to Leave Msg

## 2023-03-31 NOTE — Progress Notes (Unsigned)
[facility]  [address] [place],  [place]  [address] [phone]  Clinic Day:  03/31/2023  Referring physician: Hadley Pen, MD  ASSESSMENT & PLAN:   Assessment & Plan: No problem-specific Assessment & Plan notes found for this encounter.    The patient understands the plans discussed today and is in agreement with them.  She knows to contact our office if she develops concerns prior to her next appointment.   I provided *** minutes of face-to-face time during this encounter and > 50% was spent counseling as documented under my assessment and plan.    Adah Perl, PA-C  [facility] [place] - A DEPT OF [facility] [address] [place] [address] Dept: [phone] Dept Fax: [phone]   No orders of the defined types were placed in this encounter.     CHIEF COMPLAINT:  CC: Stage IIIB malignant carcinoid  Current Treatment:  Octreotide every 4 weeks  HISTORY OF PRESENT ILLNESS:   Oncology History  Malignant carcinoid tumor of ileum (HCC)  04/28/2014 Initial Diagnosis   Malignant carcinoid tumor of ileum (HCC)   02/02/2015 Cancer Staging   Staging form: Neuroendocrine Tumor - Duodenum/Ampulla/Jejunum/Illeum, AJCC 7th Edition - Clinical stage from 02/02/2015: Stage IIIB (T3(3), N1, M0) - Signed by [name], MD on 05/28/2020 Staged by: Managing physician Diagnostic confirmation: Positive histology Specimen type: Excision Histopathologic type: Carcinoid tumor, NOS Stage prefix: Initial diagnosis Tumor size (mm): 22 Multiple tumors: Yes Number of tumors: 3 Histologic grade (G): G1 Lymph-vascular invasion (LVI): LVI not present (absent)/not identified Residual tumor (R): R0 - None Stage used in treatment planning: Yes National guidelines used in treatment planning: Yes Type of national guideline used in treatment planning: NCCN Staging comments: Surgical resection        INTERVAL HISTORY:  Kristi Romero is here today for repeat clinical assessment. She denies fevers or chills. She denies pain. Her appetite is good. Her weight {Weight change:10426}.  REVIEW OF SYSTEMS:  Review of Systems - Oncology   VITALS:  There were no vitals taken for this visit.  Wt Readings from Last 3 Encounters:  02/04/23 193 lb 6.4 oz (87.7 kg)  12/10/22 184 lb (83.5 kg)  11/12/22 187 lb 9.6 oz (85.1 kg)    There is no height or weight on file to calculate BMI.  Performance status (ECOG): {CHL ONC [id number]  PHYSICAL EXAM:  Physical Exam  LABS:      Latest Ref Rng & Units 02/04/2023    2:10 PM 11/12/2022   10:07 AM 09/17/2022   10:24 AM  CBC  WBC 4.0 - 10.5 K/uL 9.5  9.1  8.7   Hemoglobin 12.0 - 15.0 g/dL 95.2  84.1  32.4   Hematocrit 36.0 - 46.0 % 43.8  40.3  40.5   Platelets 150 - 400 K/uL 244  309  300       Latest Ref Rng & Units 02/04/2023    2:10 PM 11/12/2022   10:07 AM 09/17/2022   10:24 AM  CMP  Glucose 70 - 99 mg/dL 89  91  98   BUN 8 - 23 mg/dL 9  10  10    Creatinine 0.44 - 1.00 mg/dL 4.01  0.27  2.53   Sodium 135 - 145 mmol/L 139  137  139   Potassium 3.5 - 5.1 mmol/L 3.5  3.8  3.3   Chloride 98 - 111 mmol/L 101  101  100  CO2 22 - 32 mmol/L 28  28  31    Calcium 8.9 - 10.3 mg/dL 9.6  9.2  9.2   Total Protein 6.5 - 8.1 g/dL 8.3  7.9  8.0   Total Bilirubin 0.3 - 1.2 mg/dL 0.4  0.3  0.3   Alkaline Phos 38 - 126 U/L 100  94  86   AST 15 - 41 U/L 19  29  15    ALT 0 - 44 U/L 14  26  11       No results found for: "CEA1", "CEA" / No results found for: "CEA1", "CEA" No results found for: "PSA1" No results found for: "ZOX096" No results found for: "CAN125"  Lab Results  Component Value Date   TOTALPROTELP 6.6 06/01/2020   ALBUMINELP 3.1 06/01/2020   A1GS 0.3 06/01/2020   A2GS 0.8 06/01/2020   BETS 1.1 06/01/2020   GAMS 1.4 06/01/2020   MSPIKE Not Observed 06/01/2020   SPEI Comment 06/01/2020   Lab Results  Component Value Date   TIBC  244 (L) 09/06/2021   FERRITIN 873 (H) 09/06/2021   IRONPCTSAT 9 (L) 09/06/2021   No results found for: "LDH"  STUDIES:  No results found.    HISTORY:   Past Medical History:  Diagnosis Date   Abdominal pain, epigastric    Accident occurring in home 05/07/2016   Acute respiratory failure with hypoxia (HCC) 09/06/2021   Adrenal adenoma 03/16/2018   Anxiety and depression 03/16/2018   BMI 26.0-26.9,adult    Cervicogenic headache 10/14/2016   Chest pain, unspecified    Chronic abdominal pain and nausea  09/06/2021   Chronic back pain    Constipation 04/08/2021   Dizziness 10/14/2016   Elevated lipase 01/13/2017   Formatting of this note might be different from the original. 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   Normocytic anemia 09/06/2021   Numbness    Numbness and tingling 10/14/2016   Pulmonary embolism with acute respiratory failure with hypoxia  09/06/2021   Restless leg syndrome    Sleeping difficulty 04/18/2016   Spondylosis, cervical, 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 Dr Arrie Aran [place]    COLONOSCOPY  09/05/2020   Dr Rayfield Citizen Benign neoplasm of transverse colon. Benign neoplasm of descending colon.   ESOPHAGOGASTRODUODENOSCOPY  02/2018   ESOPHAGOGASTRODUODENOSCOPY (EGD) WITH PROPOFOL N/A 03/16/2018   Procedure: ESOPHAGOGASTRODUODENOSCOPY (EGD) WITH PROPOFOL;  Surgeon: Meryl Dare, MD;  Location: [facility] ENDOSCOPY;  Service: Endoscopy;  Laterality: N/A;   NECK SURGERY     PARTIAL HYSTERECTOMY     TOE SURGERY  Bilateral     Family History  Problem Relation Age of Onset   Lung cancer Father    Heart disease Father    Hypertension Father    Heart disease Mother    Hypertension Mother    Cancer Mother     Social History:  reports that she has quit smoking. She has never used smokeless tobacco. She reports current drug use. Drug: Marijuana. She reports that she does not drink alcohol.The patient is {Blank single:19197::"alone","accompanied by"} *** today.  Allergies:  Allergies  Allergen Reactions   Atorvastatin Anaphylaxis   Codeine Nausea And Vomiting, Nausea Only and Shortness Of Breath    Other reaction(s): GI Upset (intolerance)  Other Reaction(s): upset stomach   Lisinopril Anaphylaxis, Swelling and Other (See Comments)    Other Reaction(s): tongue swells up    Current Medications: Current Outpatient Medications  Medication Sig Dispense Refill   acetaminophen (TYLENOL) 500 MG tablet Take 500 mg by mouth every 6 (six) hours as needed for mild pain.     apixaban (ELIQUIS) 5 MG TABS tablet Take 1 tablet (5 mg total) by mouth 2 (two) times daily. (Patient not taking: Reported on 11/12/2022) 60 tablet 0   baclofen (LIORESAL) 10 MG tablet Take 10 mg by mouth 2 (two) times daily.     diclofenac Sodium (VOLTAREN) 1 % GEL Apply topically.     dicyclomine (BENTYL) 20 MG tablet Take 20 mg by mouth 4 (four) times daily as needed for spasms.     DULoxetine (CYMBALTA) 60 MG capsule Take 60 mg by mouth daily.     famotidine (PEPCID)  20 MG tablet Take 20 mg by mouth 2 (two) times daily.     gabapentin (NEURONTIN) 600 MG tablet Take 600 mg by mouth 3 (three) times daily. Per patient     LINZESS 290 MCG CAPS capsule Take 290 mcg by mouth daily. Patient reports taking as needed     meclizine (ANTIVERT) 25 MG tablet Take 25 mg by mouth 4 (four) times daily as needed for dizziness.     methocarbamol (ROBAXIN) 500 MG tablet Take by mouth.     mirtazapine (REMERON) 15 MG tablet TAKE 1 TABLET AT BEDTIME (Patient not taking: Reported on 11/12/2022) 90 tablet 3   ondansetron (ZOFRAN) 4 MG tablet Take 1 tablet (4 mg total) by mouth every 6 (six) hours. 12 tablet 0   ondansetron (ZOFRAN-ODT) 4 MG disintegrating tablet Take 4 mg by mouth 2 (two) times daily as needed for nausea or vomiting.     pantoprazole (PROTONIX) 40 MG tablet Take 40 mg by mouth daily.     potassium chloride (KLOR-CON M) 10 MEQ tablet Take 1 tablet (10 mEq total) by mouth 2 (two) times daily. 60 tablet 5   predniSONE (DELTASONE) 20 MG tablet Take 20 mg by mouth daily.     pregabalin (LYRICA) 50 MG capsule Take by mouth. (Patient not taking: Reported on 11/12/2022)     rosuvastatin (CRESTOR) 10 MG tablet      sucralfate (CARAFATE) 1 g tablet Take 1 g by mouth daily.     traMADol (ULTRAM) 50 MG tablet Take 50 mg by mouth 2 (two) times daily as needed.     No current facility-administered medications for this visit.

## 2023-04-01 ENCOUNTER — Inpatient Hospital Stay: Payer: Medicare HMO | Admitting: Hematology and Oncology

## 2023-04-01 ENCOUNTER — Inpatient Hospital Stay: Payer: Medicare HMO | Attending: Hematology and Oncology

## 2023-04-01 ENCOUNTER — Inpatient Hospital Stay: Payer: Medicare HMO

## 2023-04-01 VITALS — BP 136/87 | HR 78

## 2023-04-01 DIAGNOSIS — C7A012 Malignant carcinoid tumor of the ileum: Secondary | ICD-10-CM | POA: Insufficient documentation

## 2023-04-01 MED ORDER — OCTREOTIDE ACETATE 20 MG IM KIT
20.0000 mg | PACK | Freq: Once | INTRAMUSCULAR | Status: AC
  Administered 2023-04-01: 20 mg via INTRAMUSCULAR
  Filled 2023-04-01: qty 1

## 2023-04-01 NOTE — Patient Instructions (Signed)
Octreotide Injection Solution What is this medication? OCTREOTIDE (ok TREE oh tide) treats high levels of growth hormone (acromegaly). It works by reducing the amount of growth hormone your body makes. This reduces symptoms and the risk of health problems caused by too much growth hormone, such as diabetes and heart disease. It may also be used to treat diarrhea caused by neuroendocrine tumors. It works by slowing down the release of serotonin from the tumor cells. This reduces the number of bowel movements you have. This medicine may be used for other purposes; ask your health care provider or pharmacist if you have questions. COMMON BRAND NAME(S): Berline Lopes, Sandostatin What should I tell my care team before I take this medication? They need to know if you have any of these conditions: Diabetes Gallbladder disease Kidney disease Liver disease Thyroid disease An unusual or allergic reaction to octreotide, other medications, foods, dyes, or preservatives Pregnant or trying to get pregnant Breast-feeding How should I use this medication? This medication is injected under the skin or into a vein. It is usually given by your care team in a hospital or clinic setting. If you get this medication at home, you will be taught how to prepare and give it. Use exactly as directed. Take it as directed on the prescription label at the same time every day. Keep taking it unless your care team tells you to stop. Allow the injection solution to come to room temperature before use. Do not warm it artificially. It is important that you put your used needles and syringes in a special sharps container. Do not put them in a trash can. If you do not have a sharps container, call your pharmacist or care team to get one. Talk to your care team about the use of this medication in children. Special care may be needed. Overdosage: If you think you have taken too much of this medicine contact a poison control center or  emergency room at once. NOTE: This medicine is only for you. Do not share this medicine with others. What if I miss a dose? If you miss a dose, take it as soon as you can. If it is almost time for your next dose, take only that dose. Do not take double or extra doses. What may interact with this medication? Bromocriptine Certain medications for blood pressure, heart disease, irregular heartbeat Cyclosporine Diuretics Medications for diabetes, including insulin Quinidine This list may not describe all possible interactions. Give your health care provider a list of all the medicines, herbs, non-prescription drugs, or dietary supplements you use. Also tell them if you smoke, drink alcohol, or use illegal drugs. Some items may interact with your medicine. What should I watch for while using this medication? Visit your care team for regular checks on your progress. Tell your care team if your symptoms do not start to get better or if they get worse. To help reduce irritation at the injection site, use a different site for each injection and make sure the solution is at room temperature before use. This medication may cause decreases in blood sugar. Signs of low blood sugar include chills, cool, pale skin or cold sweats, drowsiness, extreme hunger, fast heartbeat, headache, nausea, nervousness or anxiety, shakiness, trembling, unsteadiness, tiredness, or weakness. Contact your care team right away if you experience any of these symptoms. This medication may increase blood sugar. The risk may be higher in patients who already have diabetes. Ask your care team what you can do to lower your  risk of diabetes while taking this medication. You should make sure you get enough vitamin B12 while you are taking this medication. Discuss the foods you eat and the vitamins you take with your care team. What side effects may I notice from receiving this medication? Side effects that you should report to your care  team as soon as possible: Allergic reactions--skin rash, itching, hives, swelling of the face, lips, tongue, or throat Gallbladder problems--severe stomach pain, nausea, vomiting, fever Heart rhythm changes--fast or irregular heartbeat, dizziness, feeling faint or lightheaded, chest pain, trouble breathing High blood sugar (hyperglycemia)--increased thirst or amount of urine, unusual weakness or fatigue, blurry vision Low blood sugar (hypoglycemia)--tremors or shaking, anxiety, sweating, cold or clammy skin, confusion, dizziness, rapid heartbeat Low thyroid levels (hypothyroidism)--unusual weakness or fatigue, increased sensitivity to cold, constipation, hair loss, dry skin, weight gain, feelings of depression Low vitamin B12 level--pain, tingling, or numbness in the hands or feet, muscle weakness, dizziness, confusion, trouble concentrating Pancreatitis--severe stomach pain that spreads to your back or gets worse after eating or when touched, fever, nausea, vomiting Side effects that usually do not require medical attention (report to your care team if they continue or are bothersome): Diarrhea Dizziness Gas Headache Pain, redness, or irritation at injection site Stomach pain This list may not describe all possible side effects. Call your doctor for medical advice about side effects. You may report side effects to FDA at 1-800-FDA-1088. Where should I keep my medication? Keep out of the reach of children and pets. Store in the refrigerator. Protect from light. Allow to come to room temperature naturally. Do not use artificial heat. If protected from light, the injection may be stored between 20 and 30 degrees C (70 and 86 degrees F) for 14 days. After the initial use, throw away any unused portion of a multiple dose vial after 14 days. Get rid of any unused portions of the ampules after use. To get rid of medications that are no longer needed or have expired: Take the medication to a medication  take-back program. Ask your pharmacy or law enforcement to find a location. If you cannot return the medication, ask your pharmacist or care team how to get rid of the medication safely. NOTE: This sheet is a summary. It may not cover all possible information. If you have questions about this medicine, talk to your doctor, pharmacist, or health care provider.  2024 Elsevier/Gold Standard (2021-07-18 00:00:00)

## 2023-04-07 DIAGNOSIS — R079 Chest pain, unspecified: Secondary | ICD-10-CM | POA: Diagnosis not present

## 2023-04-28 ENCOUNTER — Other Ambulatory Visit: Payer: Self-pay | Admitting: Hematology and Oncology

## 2023-04-28 DIAGNOSIS — C7A012 Malignant carcinoid tumor of the ileum: Secondary | ICD-10-CM

## 2023-04-28 NOTE — Assessment & Plan Note (Signed)
 Pulmonary embolism in May 2023, felt to be provoked.  She was given IV heparin overnight and discharged on Eliquis.  It is unclear how long she took Eliquis for.

## 2023-04-28 NOTE — Assessment & Plan Note (Addendum)
 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  Seven of eighteen lymph nodes were involved (7/18).  Primary tumor measured 2.2 x 1.7 x 1.5 cm, with three apparent vascular metastases including a nodule up to 3 cm with metastatic tumor present within 1 mm of the inked radial margin.  In the distal appendix there was also a separate 3 mm carcinoid tumor.  This was a staged as a pT3pN1 with low mitotic rate (0 per 10 high powered fields).  [name] was sporadic.  We began seeing her in February 2022 for carcinoid syndrome symptoms.  Her chromogranin A was elevated at 262.  CT chest, abdomen and pelvis did not reveal any evidence of recurrent or metastatic disease.  She was started on octreotide  every 4 weeks.  She had normalization of her chromogranin A and this has remained normal.  Routine CT imaging has remained without evidence of recurrent or metastatic disease, most recently in May 2024.  She did not come in for her octreotide  injection in November.  She received octreotide  4 weeks ago, but cancelled her labs and follow up appointment.    She is doing fairly well today, except for recurrent low back pain.  Once again, she asks what type of cancer she has and why she is on injections.  I explained about her carcinoid that was resected, the marker in the blood that was elevated, which normalized with the injections and continuing monthly octreotide  to prevent disease recurrence.  Chromogranin A is pending from today.  She will proceed with octreotide  today. As she is doing fairly well from an oncology perspective, we will continue octreotide  every 4 weeks and plan to see her back in 12 weeks with a CBC, comprehensive metabolic panel and chromogranin A prior to octreotide .

## 2023-04-28 NOTE — Assessment & Plan Note (Addendum)
 Osteomyelitis in June 2023, status post prolonged antibiotic treatment.  There has been chronic changes on MRI since that time, most recently in February 2024, which revealed findings of resolving discitis-osteomyelitis at C7-T2 and T10-T11 with minimal residual marrow edema and enhancement.  She reports worsening low back pain.  She had a lumbar spine x-ray on 1219 that revealed lower thoracic and lumbar spine degeneration.  I asked that she follow-up with her PCP.

## 2023-04-28 NOTE — Assessment & Plan Note (Signed)
 She saw Dr. Nedra Hai, neurology, [facility] in October 2023.  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

## 2023-04-28 NOTE — Progress Notes (Signed)
 [facility]  [address] 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  Seven of eighteen lymph nodes were involved (7/18).  Primary tumor measured 2.2 x 1.7 x 1.5 cm, with three apparent vascular metastases including a nodule up to 3 cm with metastatic tumor present within 1 mm of the inked radial margin.  In the distal appendix there was also a separate 3 mm carcinoid tumor.  This was a staged as a pT3pN1 with low mitotic rate (0 per 10 high powered fields).  [name] was sporadic.  We began seeing her in February 2022 for carcinoid syndrome symptoms.  Her chromogranin A was elevated at 262.  CT chest, abdomen and pelvis did not reveal any evidence of recurrent or metastatic disease.  She was started on octreotide  every 4 weeks.  She had normalization of her chromogranin A and this has remained normal.  Routine CT imaging has remained without evidence of recurrent or metastatic disease, most recently in May 2024.  She did not come in for her octreotide  injection in November.  She received octreotide  4 weeks ago, but cancelled her labs and follow up appointment.    She is doing fairly well today, except for recurrent low back pain.  Once again, she asks what type of cancer she has and why she is on injections.  I explained about her carcinoid that was resected, the marker in the blood that was elevated, which normalized with the injections and continuing monthly octreotide  to prevent disease recurrence.  Chromogranin A is pending from today.  She will proceed with octreotide  today. As she is doing fairly well from an oncology perspective, we will continue octreotide  every 4 weeks and plan to see her back in  12 weeks with a CBC, comprehensive metabolic panel and chromogranin A prior to octreotide .  Neuropathy She saw Dr. Jama, neurology, [facility] in October 2023.  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    History of osteomyelitis Osteomyelitis in June 2023, status post prolonged antibiotic treatment.  There has been chronic changes on MRI since that time, most recently in February 2024, which revealed findings of resolving discitis-osteomyelitis at C7-T2 and T10-T11 with minimal residual marrow edema and enhancement.  She reports worsening low back pain.  She had a lumbar spine x-ray on 1219 that revealed lower thoracic and lumbar spine degeneration.  I asked that she follow-up with her PCP.  History of pulmonary embolism Pulmonary embolism in May 2023, felt to be provoked.  She was given IV heparin  overnight and discharged on Eliquis .  It is unclear how long she took Eliquis  for.  Pulmonary embolism with acute respiratory failure with hypoxia  She was found to have right lower lobe subsegmental nonocclusive pulmonary embolism incidentally in May, for which she was placed on Eliquis  twice daily.  Dr. Mardee recommended continuing Eliquis  for at least 6 months. We had thought this was discontinued, but she continues on Eliquis  5 mg daily per her PCP.  Hypokalemia Recurrent hypokalemia of uncertain etiology.  I will have her resume potassium chloride  10 mEq twice  daily.    The patient understands the plans discussed today and is in agreement with them.  She knows to contact our office if she develops concerns prior to her next appointment.   I provided 30 minutes of face-to-face time during this encounter and > 50% was spent counseling as documented under my assessment and plan.    Andrez DELENA Foy, PA-C  [place]  [facility] OF [place].  [facility] [address] [place] [address] Dept: [phone] Dept Fax: [phone]   Orders Placed This Encounter  Procedures   CBC with Differential (Cancer Center Only)    Standing Status:   Future    Expected Date:   07/23/2023    Expiration Date:   04/29/2024   CMP (Cancer Center only)    Standing Status:   Future    Expected Date:   07/23/2023    Expiration Date:   04/29/2024   Chromogranin A    Standing Status:   Future    Expected Date:   07/23/2023    Expiration Date:   04/29/2024      CHIEF COMPLAINT:  CC: Stage IIIB malignant carcinoid  Current Treatment:  Octreotide  every 4 weeks  HISTORY OF PRESENT ILLNESS:   Oncology History  Malignant carcinoid tumor of ileum (HCC)  04/28/2014 Initial Diagnosis   Malignant carcinoid tumor of ileum (HCC)   02/02/2015 Cancer Staging   Staging form: Neuroendocrine Tumor - Duodenum/Ampulla/Jejunum/Illeum, AJCC 7th Edition - Clinical stage from 02/02/2015: Stage IIIB (T3(3), N1, M0) - Signed by Cornelius Wanda DEL, MD on 05/28/2020 Staged by: Managing physician Diagnostic confirmation: Positive histology Specimen type: Excision Histopathologic type: Carcinoid tumor, NOS Stage prefix: Initial diagnosis Tumor size (mm): 22 Multiple tumors: Yes Number of tumors: 3 Histologic grade (G): G1 Lymph-vascular invasion (LVI): LVI not present (absent)/not identified Residual tumor (R): R0 - None Stage used in treatment planning: Yes National guidelines used in treatment planning: Yes Type of national guideline used in treatment planning: NCCN Staging comments: Surgical resection       INTERVAL HISTORY:  Roselie is here today for repeat clinical assessment prior to octreotide .  She was last seen in October.  She missed her octreotide  injection in November.  She received octreotide  4 weeks ago, but canceled her labs and follow-up.  She is in a wheelchair again today,  reporting increased lower back pain.  She states she had x-ray at the hospital in December.  She states she has intermittent numbness and tingling of the bilateral lower extremities.  She denies abdominal pain.  She denies fevers, chills or night sweats. Her appetite is decreased. Her weight has increased 3 pounds over last 3 months .  REVIEW OF SYSTEMS:  Review of Systems  Constitutional:  Positive for appetite change and fatigue. Negative for chills, fever and unexpected weight change.  HENT:   Negative for lump/mass, mouth sores, nosebleeds and sore throat.   Respiratory:  Positive for shortness of breath. Negative for cough and hemoptysis (with exertion).   Cardiovascular:  Negative for chest pain and leg swelling.  Gastrointestinal:  Positive for nausea. Negative for abdominal pain, blood in stool, constipation, diarrhea and vomiting.  Genitourinary:  Negative for difficulty urinating, dysuria, frequency, hematuria and vaginal bleeding.   Musculoskeletal:  Positive for back pain (lower) and gait problem (in wheelchair). Negative for arthralgias and myalgias.  Skin:  Negative for rash.  Neurological:  Positive for dizziness (occasional), gait problem (in wheelchair) and numbness (  bilateral lower extremities). Negative for headaches and light-headedness.  Hematological:  Negative for adenopathy. Does not bruise/bleed easily.  Psychiatric/Behavioral:  Negative for depression and sleep disturbance. The patient is not nervous/anxious.      VITALS:  Blood pressure 110/87, pulse 73, temperature 98.7 F (37.1 C), temperature source Oral, resp. rate 18, height 5' 4 (1.626 m), weight 196 lb 11.2 oz (89.2 kg), SpO2 100%.  Wt Readings from Last 3 Encounters:  04/30/23 196 lb 11.2 oz (89.2 kg)  02/04/23 193 lb 6.4 oz (87.7 kg)  12/10/22 184 lb (83.5 kg)    Body mass index is 33.76 kg/m.  Performance status (ECOG): 2 - Symptomatic, <50% confined to bed  PHYSICAL EXAM:  Physical Exam Vitals  and nursing note reviewed.  Constitutional:      General: She is not in acute distress.    Appearance: Normal appearance. She is ill-appearing (chronically ill appearing).     Comments: In a wheelchair  HENT:     Head: Normocephalic and atraumatic.     Mouth/Throat:     Mouth: Mucous membranes are moist.     Pharynx: Oropharynx is clear. No oropharyngeal exudate or posterior oropharyngeal erythema.  Eyes:     General: No scleral icterus.    Extraocular Movements: Extraocular movements intact.     Conjunctiva/sclera: Conjunctivae normal.     Pupils: Pupils are equal, round, and reactive to light.  Cardiovascular:     Rate and Rhythm: Normal rate and regular rhythm.     Heart sounds: Normal heart sounds. No murmur heard.    No friction rub. No gallop.  Pulmonary:     Effort: Pulmonary effort is normal.     Breath sounds: Normal breath sounds. No wheezing, rhonchi or rales.  Abdominal:     General: There is no distension.     Palpations: Abdomen is soft. There is no mass.     Tenderness: There is no abdominal tenderness.  Musculoskeletal:        General: Normal range of motion.     Cervical back: Normal range of motion and neck supple. No tenderness.     Right lower leg: No edema.     Left lower leg: No edema.  Lymphadenopathy:     Cervical: No cervical adenopathy.     Upper Body:     Right upper body: No supraclavicular or axillary adenopathy.     Left upper body: No supraclavicular or axillary adenopathy.  Skin:    General: Skin is warm and dry.     Coloration: Skin is not jaundiced.     Findings: No rash.  Neurological:     Mental Status: She is alert and oriented to person, place, and time.     Cranial Nerves: No cranial nerve deficit.  Psychiatric:        Mood and Affect: Mood normal.        Behavior: Behavior normal.        Thought Content: Thought content normal.    LABS:      Latest Ref Rng & Units 04/30/2023   10:17 AM 02/04/2023    2:10 PM 11/12/2022   10:07  AM  CBC  WBC 4.0 - 10.5 K/uL 5.7  9.5  9.1   Hemoglobin 12.0 - 15.0 g/dL 87.5  86.1  87.7   Hematocrit 36.0 - 46.0 % 37.9  43.8  40.3   Platelets 150 - 400 K/uL 286  244  309       Latest Ref Rng &  Units 04/30/2023   10:17 AM 02/04/2023    2:10 PM 11/12/2022   10:07 AM  CMP  Glucose 70 - 99 mg/dL 897  89  91   BUN 8 - 23 mg/dL 5  9  10    Creatinine 0.44 - 1.00 mg/dL 9.33  9.23  9.15   Sodium 135 - 145 mmol/L 139  139  137   Potassium 3.5 - 5.1 mmol/L 2.8  3.5  3.8   Chloride 98 - 111 mmol/L 99  101  101   CO2 22 - 32 mmol/L 32  28  28   Calcium 8.9 - 10.3 mg/dL 9.1  9.6  9.2   Total Protein 6.5 - 8.1 g/dL 7.3  8.3  7.9   Total Bilirubin 0.0 - 1.2 mg/dL 0.4  0.4  0.3   Alkaline Phos 38 - 126 U/L 110  100  94   AST 15 - 41 U/L 16  19  29    ALT 0 - 44 U/L 5  14  26       Lab Results  Component Value Date   TOTALPROTELP 6.6 06/01/2020   ALBUMINELP 3.1 06/01/2020   A1GS 0.3 06/01/2020   A2GS 0.8 06/01/2020   BETS 1.1 06/01/2020   GAMS 1.4 06/01/2020   MSPIKE Not Observed 06/01/2020   SPEI Comment 06/01/2020   Lab Results  Component Value Date   TIBC 244 (L) 09/06/2021   FERRITIN 873 (H) 09/06/2021   IRONPCTSAT 9 (L) 09/06/2021     STUDIES:    Exam(s): [id number] RAD/DG LUMBAR SPINE 2-3V  EXAM:  DG LUMBAR SPINE 2-3V  CLINICAL HISTORY:  PAIN W/O INJURY  COMPARISON:  MRI 07/13/2021.  TECHNIQUE:  Frontal, lateral, and odontoid views of the lumbar spine.  FINDINGS:  Multilevel old thoracic and lumbar spine disc space narrowing, small osteophyte formation and facet arthritis. Findings are most pronounced at T11-T12, and L5-S1. Lower lumbar spine facet arthritis. No acute bone or soft tissue pathology. Status post cholecystectomy.  IMPRESSION:  Lower thoracic and lumbar spine degeneration.   HISTORY:   Past Medical History:  Diagnosis Date   Abdominal pain, epigastric    Accident occurring in home 05/07/2016   Acute respiratory failure with hypoxia (HCC)  09/06/2021   Adrenal adenoma 03/16/2018   Anxiety and depression 03/16/2018   BMI 26.0-26.9,adult    Cervicogenic headache 10/14/2016   Chest pain, unspecified    Chronic abdominal pain and nausea  09/06/2021   Chronic back pain    Constipation 04/08/2021   Dizziness 10/14/2016   Elevated lipase 01/13/2017   Formatting of this note might be different from the original. 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   Normocytic anemia 09/06/2021   Numbness    Numbness and tingling 10/14/2016   Pulmonary embolism with acute respiratory failure with hypoxia  09/06/2021   Restless leg syndrome    Sleeping difficulty 04/18/2016   Spondylosis, cervical, 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 Dr Mckinley Money Rutledge    COLONOSCOPY  09/05/2020   Dr Anette Benign neoplasm of transverse colon. Benign neoplasm of descending colon.   ESOPHAGOGASTRODUODENOSCOPY  02/2018   ESOPHAGOGASTRODUODENOSCOPY (EGD) WITH PROPOFOL  N/A 03/16/2018   Procedure: ESOPHAGOGASTRODUODENOSCOPY (EGD) WITH PROPOFOL ;   Surgeon: Aneita Gwendlyn DASEN, MD;  Location: [facility] ENDOSCOPY;  Service: Endoscopy;  Laterality: N/A;   NECK SURGERY     PARTIAL HYSTERECTOMY     TOE SURGERY Bilateral     Family History  Problem Relation Age of Onset   Lung cancer Father    Heart disease Father    Hypertension Father    Heart disease Mother    Hypertension Mother    Cancer Mother     Social History:  reports that she has quit smoking. She has never used smokeless tobacco. She reports current drug use. Drug: Marijuana. She reports that she does not drink alcohol.The patient is alone today.  Allergies:  Allergies  Allergen Reactions   Atorvastatin Anaphylaxis   Codeine Nausea And Vomiting, Nausea Only and Shortness Of Breath    Other reaction(s): GI Upset (intolerance)  Other Reaction(s): upset stomach   Lisinopril Anaphylaxis, Swelling and Other (See Comments)    Other Reaction(s): tongue swells up    Current Medications: Current Outpatient Medications  Medication Sig Dispense Refill   apixaban  (ELIQUIS ) 5 MG TABS tablet Take 1 tablet (5 mg total) by mouth 2 (two) times daily. (Patient taking differently: Take 5 mg by mouth 2 (two) times daily. Patient voiced taking once daily) 60 tablet 0   acetaminophen  (TYLENOL ) 500 MG tablet Take 500 mg by mouth every 6 (six) hours as needed for mild pain.     baclofen (LIORESAL) 10 MG tablet Take 10 mg by mouth 2 (two) times daily.     diclofenac Sodium (VOLTAREN) 1 % GEL Apply topically.     dicyclomine  (BENTYL ) 20 MG tablet Take 20 mg by mouth 4 (four) times daily as needed for spasms.     DULoxetine  (CYMBALTA ) 60 MG capsule Take 60 mg by mouth daily. (Patient not taking: Reported on 04/30/2023)     famotidine  (PEPCID ) 20 MG tablet Take 20 mg by mouth 2 (two) times daily. (Patient not taking: Reported on 04/30/2023)     Fluticasone-Umeclidin-Vilant (TRELEGY ELLIPTA) 100-62.5-25 MCG/ACT AEPB 1 puff Inhalation Once a day for 90 days     gabapentin  (NEURONTIN ) 600 MG tablet Take 600  mg by mouth 3 (three) times daily. Per patient (Patient not taking: Reported on 04/30/2023)     isosorbide mononitrate (IMDUR) 30 MG 24 hr tablet 1 tablet in the morning Orally Once a day     LINZESS  290 MCG CAPS capsule Take 290 mcg by mouth daily. Patient reports taking as needed (Patient not taking: Reported on 04/30/2023)     meclizine  (ANTIVERT ) 25 MG tablet Take 25 mg by mouth 4 (four) times daily as needed for dizziness. (Patient not taking: Reported on 04/30/2023)     methocarbamol  (ROBAXIN ) 500 MG tablet Take by mouth.     metoprolol  succinate (TOPROL -XL) 25 MG 24 hr tablet 1 tablet Orally Once  a day     mirtazapine  (REMERON ) 15 MG tablet TAKE 1 TABLET AT BEDTIME (Patient not taking: Reported on 11/12/2022) 90 tablet 3   nitroGLYCERIN (NITROSTAT) 0.4 MG SL tablet 1 tablet under the tongue and allow to dissolve as needed. Take every 5 minutes up to 3 times if chest pain persists Sublingual Three times a day     ondansetron  (ZOFRAN ) 4 MG tablet Take 1 tablet (4 mg total) by mouth every 6 (six) hours. 12 tablet 0   ondansetron  (ZOFRAN -ODT) 4 MG disintegrating tablet Take 4 mg by mouth 2 (two) times daily as needed for nausea or vomiting.     pantoprazole  (PROTONIX ) 40 MG tablet Take 40 mg by mouth daily. (Patient not taking: Reported on 04/30/2023)     potassium chloride  (KLOR-CON  M) 10 MEQ tablet Take 1 tablet (10 mEq total) by mouth 2 (two) times daily. 60 tablet 5   predniSONE (DELTASONE) 20 MG tablet Take 20 mg by mouth daily. (Patient not taking: Reported on 04/30/2023)     pregabalin (LYRICA) 50 MG capsule Take by mouth. (Patient not taking: Reported on 11/12/2022)     rosuvastatin (CRESTOR) 10 MG tablet      sucralfate  (CARAFATE ) 1 g tablet Take 1 g by mouth daily.     traMADol (ULTRAM) 50 MG tablet Take 50 mg by mouth 2 (two) times daily as needed.     No current facility-administered medications for this visit.

## 2023-04-30 ENCOUNTER — Telehealth: Payer: Self-pay | Admitting: Hematology and Oncology

## 2023-04-30 ENCOUNTER — Inpatient Hospital Stay: Payer: Medicare HMO | Attending: Hematology and Oncology

## 2023-04-30 ENCOUNTER — Encounter: Payer: Self-pay | Admitting: Hematology and Oncology

## 2023-04-30 ENCOUNTER — Inpatient Hospital Stay (HOSPITAL_BASED_OUTPATIENT_CLINIC_OR_DEPARTMENT_OTHER): Payer: Medicare HMO | Admitting: Hematology and Oncology

## 2023-04-30 ENCOUNTER — Telehealth: Payer: Self-pay

## 2023-04-30 ENCOUNTER — Inpatient Hospital Stay: Payer: Medicare HMO

## 2023-04-30 VITALS — BP 110/87 | HR 73 | Temp 98.7°F | Resp 18 | Ht 64.0 in | Wt 196.7 lb

## 2023-04-30 DIAGNOSIS — E876 Hypokalemia: Secondary | ICD-10-CM | POA: Insufficient documentation

## 2023-04-30 DIAGNOSIS — Z86711 Personal history of pulmonary embolism: Secondary | ICD-10-CM | POA: Diagnosis not present

## 2023-04-30 DIAGNOSIS — R978 Other abnormal tumor markers: Secondary | ICD-10-CM | POA: Diagnosis not present

## 2023-04-30 DIAGNOSIS — G629 Polyneuropathy, unspecified: Secondary | ICD-10-CM | POA: Diagnosis not present

## 2023-04-30 DIAGNOSIS — C7A012 Malignant carcinoid tumor of the ileum: Secondary | ICD-10-CM | POA: Diagnosis not present

## 2023-04-30 DIAGNOSIS — Z8739 Personal history of other diseases of the musculoskeletal system and connective tissue: Secondary | ICD-10-CM | POA: Diagnosis not present

## 2023-04-30 DIAGNOSIS — Z7901 Long term (current) use of anticoagulants: Secondary | ICD-10-CM | POA: Diagnosis not present

## 2023-04-30 DIAGNOSIS — I2699 Other pulmonary embolism without acute cor pulmonale: Secondary | ICD-10-CM

## 2023-04-30 LAB — CMP (CANCER CENTER ONLY)
ALT: <5 U/L (ref 0–44)
AST: 16 U/L (ref 15–41)
Albumin: 3.6 g/dL (ref 3.5–5.0)
Alkaline Phosphatase: 110 U/L (ref 38–126)
Anion gap: 8 (ref 5–15)
BUN: 5 mg/dL — ABNORMAL LOW (ref 8–23)
CO2: 32 mmol/L (ref 22–32)
Calcium: 9.1 mg/dL (ref 8.9–10.3)
Chloride: 99 mmol/L (ref 98–111)
Creatinine: 0.66 mg/dL (ref 0.44–1.00)
GFR, Estimated: >60 mL/min (ref >60)
Glucose, Bld: 102 mg/dL — ABNORMAL HIGH (ref 70–99)
Potassium: 2.8 mmol/L — ABNORMAL LOW (ref 3.5–5.1)
Sodium: 139 mmol/L (ref 135–145)
Total Bilirubin: 0.4 mg/dL (ref 0.0–1.2)
Total Protein: 7.3 g/dL (ref 6.5–8.1)

## 2023-04-30 LAB — CBC WITH DIFFERENTIAL (CANCER CENTER ONLY)
Abs Immature Granulocytes: 0.02 10*3/uL (ref 0.00–0.07)
Basophils Absolute: 0 10*3/uL (ref 0.0–0.1)
Basophils Relative: 0 %
Eosinophils Absolute: 0 10*3/uL (ref 0.0–0.5)
Eosinophils Relative: 0 %
HCT: 37.9 % (ref 36.0–46.0)
Hemoglobin: 12.4 g/dL (ref 12.0–15.0)
Immature Granulocytes: 0 %
Lymphocytes Relative: 34 %
Lymphs Abs: 1.9 10*3/uL (ref 0.7–4.0)
MCH: 30.2 pg (ref 26.0–34.0)
MCHC: 32.7 g/dL (ref 30.0–36.0)
MCV: 92.4 fL (ref 80.0–100.0)
Monocytes Absolute: 0.7 10*3/uL (ref 0.1–1.0)
Monocytes Relative: 12 %
Neutro Abs: 3.1 10*3/uL (ref 1.7–7.7)
Neutrophils Relative %: 54 %
Platelet Count: 286 10*3/uL (ref 150–400)
RBC: 4.1 MIL/uL (ref 3.87–5.11)
RDW: 13.6 % (ref 11.5–15.5)
WBC Count: 5.7 10*3/uL (ref 4.0–10.5)
nRBC: 0 % (ref 0.0–0.2)
nRBC: 0 /100{WBCs}

## 2023-04-30 MED ORDER — POTASSIUM CHLORIDE CRYS ER 10 MEQ PO TBCR: 10.0000 meq | EXTENDED_RELEASE_TABLET | Freq: Two times a day (BID) | ORAL | 5 refills | Status: DC

## 2023-04-30 MED ORDER — OCTREOTIDE ACETATE 20 MG IM KIT
20.0000 mg | PACK | Freq: Once | INTRAMUSCULAR | Status: AC
  Administered 2023-04-30: 20 mg via INTRAMUSCULAR
  Filled 2023-04-30: qty 1

## 2023-04-30 NOTE — Telephone Encounter (Signed)
-----   Message from Kristi Romero sent at 04/30/2023 12:17 PM EST ----- Please let her know her potassium is low, so she needs to resume potassium and likely stay on it indefinitely.  I sent in a new Rx.  Thank you

## 2023-04-30 NOTE — Telephone Encounter (Signed)
 Patient notified and voiced understanding.

## 2023-04-30 NOTE — Assessment & Plan Note (Signed)
 She was found to have right lower lobe subsegmental nonocclusive pulmonary embolism incidentally in May, for which she was placed on Eliquis twice daily.  Dr. Blenda Nicely recommended continuing Eliquis for at least 6 months. We had thought this was discontinued, but she continues on Eliquis 5 mg daily per her PCP.

## 2023-04-30 NOTE — Telephone Encounter (Signed)
 Patient has been scheduled for follow-up visit per 04/30/23 LOS.  Pt given an appt calendar with date and time.

## 2023-04-30 NOTE — Assessment & Plan Note (Signed)
 Recurrent hypokalemia of uncertain etiology.  I will have her resume potassium chloride 10 mEq twice daily.

## 2023-04-30 NOTE — Patient Instructions (Signed)
Octreotide Injection Solution What is this medication? OCTREOTIDE (ok TREE oh tide) treats high levels of growth hormone (acromegaly). It works by reducing the amount of growth hormone your body makes. This reduces symptoms and the risk of health problems caused by too much growth hormone, such as diabetes and heart disease. It may also be used to treat diarrhea caused by neuroendocrine tumors. It works by slowing down the release of serotonin from the tumor cells. This reduces the number of bowel movements you have. This medicine may be used for other purposes; ask your health care provider or pharmacist if you have questions. COMMON BRAND NAME(S): Berline Lopes, Sandostatin What should I tell my care team before I take this medication? They need to know if you have any of these conditions: Diabetes Gallbladder disease Kidney disease Liver disease Thyroid disease An unusual or allergic reaction to octreotide, other medications, foods, dyes, or preservatives Pregnant or trying to get pregnant Breast-feeding How should I use this medication? This medication is injected under the skin or into a vein. It is usually given by your care team in a hospital or clinic setting. If you get this medication at home, you will be taught how to prepare and give it. Use exactly as directed. Take it as directed on the prescription label at the same time every day. Keep taking it unless your care team tells you to stop. Allow the injection solution to come to room temperature before use. Do not warm it artificially. It is important that you put your used needles and syringes in a special sharps container. Do not put them in a trash can. If you do not have a sharps container, call your pharmacist or care team to get one. Talk to your care team about the use of this medication in children. Special care may be needed. Overdosage: If you think you have taken too much of this medicine contact a poison control center or  emergency room at once. NOTE: This medicine is only for you. Do not share this medicine with others. What if I miss a dose? If you miss a dose, take it as soon as you can. If it is almost time for your next dose, take only that dose. Do not take double or extra doses. What may interact with this medication? Bromocriptine Certain medications for blood pressure, heart disease, irregular heartbeat Cyclosporine Diuretics Medications for diabetes, including insulin Quinidine This list may not describe all possible interactions. Give your health care provider a list of all the medicines, herbs, non-prescription drugs, or dietary supplements you use. Also tell them if you smoke, drink alcohol, or use illegal drugs. Some items may interact with your medicine. What should I watch for while using this medication? Visit your care team for regular checks on your progress. Tell your care team if your symptoms do not start to get better or if they get worse. To help reduce irritation at the injection site, use a different site for each injection and make sure the solution is at room temperature before use. This medication may cause decreases in blood sugar. Signs of low blood sugar include chills, cool, pale skin or cold sweats, drowsiness, extreme hunger, fast heartbeat, headache, nausea, nervousness or anxiety, shakiness, trembling, unsteadiness, tiredness, or weakness. Contact your care team right away if you experience any of these symptoms. This medication may increase blood sugar. The risk may be higher in patients who already have diabetes. Ask your care team what you can do to lower your  risk of diabetes while taking this medication. You should make sure you get enough vitamin B12 while you are taking this medication. Discuss the foods you eat and the vitamins you take with your care team. What side effects may I notice from receiving this medication? Side effects that you should report to your care  team as soon as possible: Allergic reactions--skin rash, itching, hives, swelling of the face, lips, tongue, or throat Gallbladder problems--severe stomach pain, nausea, vomiting, fever Heart rhythm changes--fast or irregular heartbeat, dizziness, feeling faint or lightheaded, chest pain, trouble breathing High blood sugar (hyperglycemia)--increased thirst or amount of urine, unusual weakness or fatigue, blurry vision Low blood sugar (hypoglycemia)--tremors or shaking, anxiety, sweating, cold or clammy skin, confusion, dizziness, rapid heartbeat Low thyroid levels (hypothyroidism)--unusual weakness or fatigue, increased sensitivity to cold, constipation, hair loss, dry skin, weight gain, feelings of depression Low vitamin B12 level--pain, tingling, or numbness in the hands or feet, muscle weakness, dizziness, confusion, trouble concentrating Pancreatitis--severe stomach pain that spreads to your back or gets worse after eating or when touched, fever, nausea, vomiting Side effects that usually do not require medical attention (report to your care team if they continue or are bothersome): Diarrhea Dizziness Gas Headache Pain, redness, or irritation at injection site Stomach pain This list may not describe all possible side effects. Call your doctor for medical advice about side effects. You may report side effects to FDA at 1-800-FDA-1088. Where should I keep my medication? Keep out of the reach of children and pets. Store in the refrigerator. Protect from light. Allow to come to room temperature naturally. Do not use artificial heat. If protected from light, the injection may be stored between 20 and 30 degrees C (70 and 86 degrees F) for 14 days. After the initial use, throw away any unused portion of a multiple dose vial after 14 days. Get rid of any unused portions of the ampules after use. To get rid of medications that are no longer needed or have expired: Take the medication to a medication  take-back program. Ask your pharmacy or law enforcement to find a location. If you cannot return the medication, ask your pharmacist or care team how to get rid of the medication safely. NOTE: This sheet is a summary. It may not cover all possible information. If you have questions about this medicine, talk to your doctor, pharmacist, or health care provider.  2024 Elsevier/Gold Standard (2021-07-18 00:00:00)

## 2023-05-01 LAB — CHROMOGRANIN A: Chromogranin A (ng/mL): 30.9 ng/mL (ref 0.0–101.8)

## 2023-05-21 ENCOUNTER — Encounter: Payer: Self-pay | Admitting: Oncology

## 2023-05-26 ENCOUNTER — Encounter: Payer: Self-pay | Admitting: Oncology

## 2023-05-28 ENCOUNTER — Inpatient Hospital Stay: Payer: Medicare HMO

## 2023-05-28 VITALS — BP 134/81 | HR 67 | Temp 98.7°F | Resp 20 | Ht 64.0 in | Wt 187.0 lb

## 2023-05-28 DIAGNOSIS — C7A012 Malignant carcinoid tumor of the ileum: Secondary | ICD-10-CM

## 2023-05-28 MED ORDER — OCTREOTIDE ACETATE 20 MG IM KIT
20.0000 mg | PACK | Freq: Once | INTRAMUSCULAR | Status: AC
Start: 2023-05-28 — End: 2023-05-28
  Administered 2023-05-28: 20 mg via INTRAMUSCULAR
  Filled 2023-05-28: qty 1

## 2023-05-28 NOTE — Patient Instructions (Signed)
Octreotide Injection Solution What is this medication? OCTREOTIDE (ok TREE oh tide) treats high levels of growth hormone (acromegaly). It works by reducing the amount of growth hormone your body makes. This reduces symptoms and the risk of health problems caused by too much growth hormone, such as diabetes and heart disease. It may also be used to treat diarrhea caused by neuroendocrine tumors. It works by slowing down the release of serotonin from the tumor cells. This reduces the number of bowel movements you have. This medicine may be used for other purposes; ask your health care provider or pharmacist if you have questions. COMMON BRAND NAME(S): Berline Lopes, Sandostatin What should I tell my care team before I take this medication? They need to know if you have any of these conditions: Diabetes Gallbladder disease Heart disease Kidney disease Liver disease Pancreatic disease Thyroid disease An unusual or allergic reaction to octreotide, other medications, foods, dyes, or preservatives Pregnant or trying to get pregnant Breastfeeding How should I use this medication? This medication is injected under the skin or into a vein. It is usually given by your care team in a hospital or clinic setting. If you get this medication at home, you will be taught how to prepare and give it. Use exactly as directed. Take it as directed on the prescription label at the same time every day. Keep taking it unless your care team tells you to stop. Allow the injection solution to come to room temperature before use. Do not warm it artificially. It is important that you put your used needles and syringes in a special sharps container. Do not put them in a trash can. If you do not have a sharps container, call your pharmacist or care team to get one. Talk to your care team about the use of this medication in children. Special care may be needed. Overdosage: If you think you have taken too much of this medicine  contact a poison control center or emergency room at once. NOTE: This medicine is only for you. Do not share this medicine with others. What if I miss a dose? If you miss a dose, take it as soon as you can. If it is almost time for your next dose, take only that dose. Do not take double or extra doses. What may interact with this medication? Bromocriptine Certain medications for blood pressure, heart disease, irregular heartbeat Cyclosporine Diuretics Medications for diabetes, including insulin Quinidine This list may not describe all possible interactions. Give your health care provider a list of all the medicines, herbs, non-prescription drugs, or dietary supplements you use. Also tell them if you smoke, drink alcohol, or use illegal drugs. Some items may interact with your medicine. What should I watch for while using this medication? Visit your care team for regular checks on your progress. Tell your care team if your symptoms do not start to get better or if they get worse. To help reduce irritation at the injection site, use a different site for each injection and make sure the solution is at room temperature before use. This medication may cause decreases in blood sugar. Signs of low blood sugar include chills, cool, pale skin or cold sweats, drowsiness, extreme hunger, fast heartbeat, headache, nausea, nervousness or anxiety, shakiness, trembling, unsteadiness, tiredness, or weakness. Contact your care team right away if you experience any of these symptoms. This medication may increase blood sugar. The risk may be higher in patients who already have diabetes. Ask your care team what you can  do to lower your risk of diabetes while taking this medication. You should make sure you get enough vitamin B12 while you are taking this medication. Discuss the foods you eat and the vitamins you take with your care team. What side effects may I notice from receiving this medication? Side effects that  you should report to your care team as soon as possible: Allergic reactions--skin rash, itching, hives, swelling of the face, lips, tongue, or throat Gallbladder problems--severe stomach pain, nausea, vomiting, fever Heart rhythm changes--fast or irregular heartbeat, dizziness, feeling faint or lightheaded, chest pain, trouble breathing High blood sugar (hyperglycemia)--increased thirst or amount of urine, unusual weakness or fatigue, blurry vision Low blood sugar (hypoglycemia)--tremors or shaking, anxiety, sweating, cold or clammy skin, confusion, dizziness, rapid heartbeat Low thyroid levels (hypothyroidism)--unusual weakness or fatigue, increased sensitivity to cold, constipation, hair loss, dry skin, weight gain, feelings of depression Low vitamin B12 level--pain, tingling, or numbness in the hands or feet, muscle weakness, dizziness, confusion, trouble concentrating Oily or light-colored stools, diarrhea, bloating, weight loss Pancreatitis--severe stomach pain that spreads to your back or gets worse after eating or when touched, fever, nausea, vomiting Slow heartbeat--dizziness, feeling faint or lightheaded, confusion, trouble breathing, unusual weakness or fatigue Side effects that usually do not require medical attention (report these to your care team if they continue or are bothersome): Diarrhea Dizziness Headache Nausea Pain, redness, or irritation at injection site Stomach pain This list may not describe all possible side effects. Call your doctor for medical advice about side effects. You may report side effects to FDA at 1-800-FDA-1088. Where should I keep my medication? Keep out of the reach of children and pets. Store in the refrigerator. Protect from light. Allow to come to room temperature naturally. Do not use artificial heat. If protected from light, the injection may be stored between 20 and 30 degrees C (70 and 86 degrees F) for 14 days. After the initial use, throw away  any unused portion of a multiple dose vial after 14 days. Get rid of any unused portions of the ampules after use. To get rid of medications that are no longer needed or have expired: Take the medication to a medication take-back program. Ask your pharmacy or law enforcement to find a location. If you cannot return the medication, ask your pharmacist or care team how to get rid of the medication safely. NOTE: This sheet is a summary. It may not cover all possible information. If you have questions about this medicine, talk to your doctor, pharmacist, or health care provider.  2024 [employer]/Gold Standard (2023-03-27 00:00:00)

## 2023-06-25 ENCOUNTER — Inpatient Hospital Stay: Payer: Medicare HMO | Attending: Hematology and Oncology

## 2023-07-23 ENCOUNTER — Inpatient Hospital Stay: Payer: Medicare HMO

## 2023-07-23 ENCOUNTER — Inpatient Hospital Stay: Payer: Medicare HMO | Admitting: Hematology and Oncology

## 2023-07-28 ENCOUNTER — Inpatient Hospital Stay

## 2023-07-28 ENCOUNTER — Inpatient Hospital Stay: Admitting: Hematology and Oncology

## 2023-07-30 ENCOUNTER — Telehealth: Payer: Self-pay | Admitting: Hematology and Oncology

## 2023-07-30 ENCOUNTER — Inpatient Hospital Stay

## 2023-07-30 ENCOUNTER — Inpatient Hospital Stay: Attending: Hematology and Oncology

## 2023-07-30 ENCOUNTER — Inpatient Hospital Stay (HOSPITAL_BASED_OUTPATIENT_CLINIC_OR_DEPARTMENT_OTHER): Admitting: Hematology and Oncology

## 2023-07-30 ENCOUNTER — Encounter: Payer: Self-pay | Admitting: Hematology and Oncology

## 2023-07-30 VITALS — BP 130/74 | HR 63 | Temp 98.6°F | Resp 16 | Ht 64.0 in | Wt 192.0 lb

## 2023-07-30 DIAGNOSIS — G629 Polyneuropathy, unspecified: Secondary | ICD-10-CM

## 2023-07-30 DIAGNOSIS — C7A012 Malignant carcinoid tumor of the ileum: Secondary | ICD-10-CM

## 2023-07-30 DIAGNOSIS — Z7901 Long term (current) use of anticoagulants: Secondary | ICD-10-CM | POA: Insufficient documentation

## 2023-07-30 DIAGNOSIS — Z79899 Other long term (current) drug therapy: Secondary | ICD-10-CM | POA: Insufficient documentation

## 2023-07-30 DIAGNOSIS — Z86711 Personal history of pulmonary embolism: Secondary | ICD-10-CM | POA: Insufficient documentation

## 2023-07-30 DIAGNOSIS — I2699 Other pulmonary embolism without acute cor pulmonale: Secondary | ICD-10-CM

## 2023-07-30 LAB — CBC WITH DIFFERENTIAL (CANCER CENTER ONLY)
Abs Immature Granulocytes: 0 10*3/uL (ref 0.00–0.07)
Basophils Absolute: 0 10*3/uL (ref 0.0–0.1)
Basophils Relative: 0 %
Eosinophils Absolute: 0.1 10*3/uL (ref 0.0–0.5)
Eosinophils Relative: 1 %
HCT: 38.2 % (ref 36.0–46.0)
Hemoglobin: 12.6 g/dL (ref 12.0–15.0)
Immature Granulocytes: 0 %
Lymphocytes Relative: 49 %
Lymphs Abs: 5.5 10*3/uL — ABNORMAL HIGH (ref 0.7–4.0)
MCH: 31.2 pg (ref 26.0–34.0)
MCHC: 33 g/dL (ref 30.0–36.0)
MCV: 94.6 fL (ref 80.0–100.0)
Monocytes Absolute: 0.7 10*3/uL (ref 0.1–1.0)
Monocytes Relative: 6 %
Neutro Abs: 4.9 10*3/uL (ref 1.7–7.7)
Neutrophils Relative %: 44 %
Platelet Count: 253 10*3/uL (ref 150–400)
RBC: 4.04 MIL/uL (ref 3.87–5.11)
RDW: 14.3 % (ref 11.5–15.5)
Smear Review: NORMAL
WBC Count: 11.2 10*3/uL — ABNORMAL HIGH (ref 4.0–10.5)
nRBC: 0 % (ref 0.0–0.2)
nRBC: 0 /100{WBCs}

## 2023-07-30 LAB — CMP (CANCER CENTER ONLY)
ALT: 9 U/L (ref 0–44)
AST: 16 U/L (ref 15–41)
Albumin: 3.8 g/dL (ref 3.5–5.0)
Alkaline Phosphatase: 119 U/L (ref 38–126)
Anion gap: 10 (ref 5–15)
BUN: 10 mg/dL (ref 8–23)
CO2: 28 mmol/L (ref 22–32)
Calcium: 9.5 mg/dL (ref 8.9–10.3)
Chloride: 102 mmol/L (ref 98–111)
Creatinine: 0.66 mg/dL (ref 0.44–1.00)
GFR, Estimated: >60 mL/min (ref >60)
Glucose, Bld: 83 mg/dL (ref 70–99)
Potassium: 3.8 mmol/L (ref 3.5–5.1)
Sodium: 139 mmol/L (ref 135–145)
Total Bilirubin: 0.2 mg/dL (ref 0.0–1.2)
Total Protein: 7.4 g/dL (ref 6.5–8.1)

## 2023-07-30 MED ORDER — OCTREOTIDE ACETATE 20 MG IM KIT
20.0000 mg | PACK | Freq: Once | INTRAMUSCULAR | Status: AC
Start: 2023-07-30 — End: 2023-07-30
  Administered 2023-07-30: 20 mg via INTRAMUSCULAR

## 2023-07-30 MED ORDER — OMEPRAZOLE 40 MG PO CPDR: 40.0000 mg | DELAYED_RELEASE_CAPSULE | Freq: Every day | ORAL | 1 refills | Status: AC

## 2023-07-30 NOTE — Assessment & Plan Note (Signed)
 She saw Dr. Nedra Hai, neurology, [facility] in October 2023.  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

## 2023-07-30 NOTE — Assessment & Plan Note (Signed)
 She was found to have right lower lobe subsegmental nonocclusive pulmonary embolism incidentally in May, for which she was placed on Eliquis twice daily.  Dr. Blenda Nicely recommended continuing Eliquis for at least 6 months. We had thought this was discontinued, but she continues on Eliquis 5 mg daily per her PCP.

## 2023-07-30 NOTE — Patient Instructions (Signed)
 Octreotide Injection Solution What is this medication? OCTREOTIDE (ok TREE oh tide) treats high levels of growth hormone (acromegaly). It works by reducing the amount of growth hormone your body makes. This reduces symptoms and the risk of health problems caused by too much growth hormone, such as diabetes and heart disease. It may also be used to treat diarrhea caused by neuroendocrine tumors. It works by slowing down the release of serotonin from the tumor cells. This reduces the number of bowel movements you have. This medicine may be used for other purposes; ask your health care provider or pharmacist if you have questions. COMMON BRAND NAME(S): Berline Lopes, Sandostatin What should I tell my care team before I take this medication? They need to know if you have any of these conditions: Diabetes Gallbladder disease Heart disease Kidney disease Liver disease Pancreatic disease Thyroid disease An unusual or allergic reaction to octreotide, other medications, foods, dyes, or preservatives Pregnant or trying to get pregnant Breastfeeding How should I use this medication? This medication is injected under the skin or into a vein. It is usually given by your care team in a hospital or clinic setting. If you get this medication at home, you will be taught how to prepare and give it. Use exactly as directed. Take it as directed on the prescription label at the same time every day. Keep taking it unless your care team tells you to stop. Allow the injection solution to come to room temperature before use. Do not warm it artificially. It is important that you put your used needles and syringes in a special sharps container. Do not put them in a trash can. If you do not have a sharps container, call your pharmacist or care team to get one. Talk to your care team about the use of this medication in children. Special care may be needed. Overdosage: If you think you have taken too much of this medicine  contact a poison control center or emergency room at once. NOTE: This medicine is only for you. Do not share this medicine with others. What if I miss a dose? If you miss a dose, take it as soon as you can. If it is almost time for your next dose, take only that dose. Do not take double or extra doses. What may interact with this medication? Bromocriptine Certain medications for blood pressure, heart disease, irregular heartbeat Cyclosporine Diuretics Medications for diabetes, including insulin Quinidine This list may not describe all possible interactions. Give your health care provider a list of all the medicines, herbs, non-prescription drugs, or dietary supplements you use. Also tell them if you smoke, drink alcohol, or use illegal drugs. Some items may interact with your medicine. What should I watch for while using this medication? Visit your care team for regular checks on your progress. Tell your care team if your symptoms do not start to get better or if they get worse. To help reduce irritation at the injection site, use a different site for each injection and make sure the solution is at room temperature before use. This medication may cause decreases in blood sugar. Signs of low blood sugar include chills, cool, pale skin or cold sweats, drowsiness, extreme hunger, fast heartbeat, headache, nausea, nervousness or anxiety, shakiness, trembling, unsteadiness, tiredness, or weakness. Contact your care team right away if you experience any of these symptoms. This medication may increase blood sugar. The risk may be higher in patients who already have diabetes. Ask your care team what you can  do to lower your risk of diabetes while taking this medication. You should make sure you get enough vitamin B12 while you are taking this medication. Discuss the foods you eat and the vitamins you take with your care team. What side effects may I notice from receiving this medication? Side effects that  you should report to your care team as soon as possible: Allergic reactions--skin rash, itching, hives, swelling of the face, lips, tongue, or throat Gallbladder problems--severe stomach pain, nausea, vomiting, fever Heart rhythm changes--fast or irregular heartbeat, dizziness, feeling faint or lightheaded, chest pain, trouble breathing High blood sugar (hyperglycemia)--increased thirst or amount of urine, unusual weakness or fatigue, blurry vision Low blood sugar (hypoglycemia)--tremors or shaking, anxiety, sweating, cold or clammy skin, confusion, dizziness, rapid heartbeat Low thyroid levels (hypothyroidism)--unusual weakness or fatigue, increased sensitivity to cold, constipation, hair loss, dry skin, weight gain, feelings of depression Low vitamin B12 level--pain, tingling, or numbness in the hands or feet, muscle weakness, dizziness, confusion, trouble concentrating Oily or light-colored stools, diarrhea, bloating, weight loss Pancreatitis--severe stomach pain that spreads to your back or gets worse after eating or when touched, fever, nausea, vomiting Slow heartbeat--dizziness, feeling faint or lightheaded, confusion, trouble breathing, unusual weakness or fatigue Side effects that usually do not require medical attention (report these to your care team if they continue or are bothersome): Diarrhea Dizziness Headache Nausea Pain, redness, or irritation at injection site Stomach pain This list may not describe all possible side effects. Call your doctor for medical advice about side effects. You may report side effects to FDA at 1-800-FDA-1088. Where should I keep my medication? Keep out of the reach of children and pets. Store in the refrigerator. Protect from light. Allow to come to room temperature naturally. Do not use artificial heat. If protected from light, the injection may be stored between 20 and 30 degrees C (70 and 86 degrees F) for 14 days. After the initial use, throw away  any unused portion of a multiple dose vial after 14 days. Get rid of any unused portions of the ampules after use. To get rid of medications that are no longer needed or have expired: Take the medication to a medication take-back program. Ask your pharmacy or law enforcement to find a location. If you cannot return the medication, ask your pharmacist or care team how to get rid of the medication safely. NOTE: This sheet is a summary. It may not cover all possible information. If you have questions about this medicine, talk to your doctor, pharmacist, or health care provider.  2024 [employer]/Gold Standard (2023-03-27 00:00:00)

## 2023-07-30 NOTE — Assessment & Plan Note (Signed)
 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  Seven of eighteen lymph nodes were involved (7/18).  Primary tumor measured 2.2 x 1.7 x 1.5 cm, with three apparent vascular metastases including a nodule up to 3 cm with metastatic tumor present within 1 mm of the inked radial margin.  In the distal appendix there was also a separate 3 mm carcinoid tumor.  This was a staged as a pT3pN1 with low mitotic rate (0 per 10 high powered fields).  [name] was sporadic.  We began seeing her in February 2022 for carcinoid syndrome symptoms.  Her chromogranin A was elevated at 262.  CT chest, abdomen and pelvis did not reveal any evidence of recurrent or metastatic disease.  She was started on octreotide every 4 weeks.  She had normalization of her chromogranin A and this has remained normal.  Routine CT imaging has remained without evidence of recurrent or metastatic disease, most recently in May 2024.  She is not always compliant with her octreotide injections.  She missed her injection in February as she was in the ED at [facility] for chest pain.  Evaluation did not reveal cardiac abnormality.  Octreotide has been associated with nonspecific chest pain and less than 1% of patients.  She has not had regular episodes of chest pain.  If that occurs, we may need to consider holding octreotide.  She will proceed with octreotide today.   Chromogranin A is pending from today. We will continue octreotide every 4 weeks and plan to see her back in 12 weeks with a CBC, comprehensive metabolic panel and chromogranin A prior to octreotide.

## 2023-07-30 NOTE — Progress Notes (Signed)
 [facility]  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  Seven of eighteen lymph nodes were involved (7/18).  Primary tumor measured 2.2 x 1.7 x 1.5 cm, with three apparent vascular metastases including a nodule up to 3 cm with metastatic tumor present within 1 mm of the inked radial margin.  In the distal appendix there was also a separate 3 mm carcinoid tumor.  This was a staged as a pT3pN1 with low mitotic rate (0 per 10 high powered fields).  [name] was sporadic.  We began seeing her in February 2022 for carcinoid syndrome symptoms.  Her chromogranin A was elevated at 262.  CT chest, abdomen and pelvis did not reveal any evidence of recurrent or metastatic disease.  She was started on octreotide every 4 weeks.  She had normalization of her chromogranin A and this has remained normal.  Routine CT imaging has remained without evidence of recurrent or metastatic disease, most recently in May 2024.  She is not always compliant with her octreotide injections.  She missed her injection in February as she was in the ED at [facility] for chest pain.  Evaluation did not reveal cardiac abnormality.  Octreotide has been associated with nonspecific chest pain and less than 1% of patients.  She has not had regular episodes of chest pain.  If that occurs, we may need to consider holding octreotide.  She will proceed with octreotide today.   Chromogranin A is pending from today. We will continue octreotide every 4 weeks and plan to see her back in 12 weeks with a CBC, comprehensive metabolic panel and chromogranin A prior to octreotide.  Neuropathy She saw Dr. Nedra Hai, neurology, [facility] in October  2023.  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    Pulmonary embolism with acute respiratory failure with hypoxia  She was found to have right lower lobe subsegmental nonocclusive pulmonary embolism incidentally in May, for which she was placed on Eliquis twice daily.  Dr. Blenda Nicely recommended continuing Eliquis for at least 6 months. We had thought this was discontinued, but she continues on Eliquis 5 mg daily per her PCP.   The patient understands the plans discussed today and is in agreement with them.  She knows to contact our office if she develops concerns prior to her next appointment.   I provided 15 minutes of face-to-face time during this encounter and > 50% was spent counseling as documented under my assessment and plan.    Adah Perl, PA-C  [facility] [place] - [facility] [address] [place] [address] Dept: [phone] Dept Fax: [phone]   No orders of the defined types were placed in this encounter.     CHIEF COMPLAINT:  CC: Malignant carcinoid  Current Treatment: Octreotide every 4 weeks  HISTORY OF PRESENT ILLNESS:   Oncology History  Malignant carcinoid tumor of ileum (HCC)  04/28/2014 Initial Diagnosis   Malignant carcinoid tumor of ileum (HCC)   02/02/2015 Cancer Staging   Staging form:  Neuroendocrine Tumor - Duodenum/Ampulla/Jejunum/Illeum, AJCC 7th Edition - Clinical stage from 02/02/2015: Stage IIIB (T3(3), N1, M0) - Signed by [name], MD on 05/28/2020 Staged by: Managing physician Diagnostic confirmation: Positive histology Specimen type: Excision Histopathologic type: Carcinoid tumor, NOS Stage prefix: Initial diagnosis Tumor size (mm): 22 Multiple tumors: Yes Number of tumors: 3 Histologic grade  (G): G1 Lymph-vascular invasion (LVI): LVI not present (absent)/not identified Residual tumor (R): R0 - None Stage used in treatment planning: Yes National guidelines used in treatment planning: Yes Type of national guideline used in treatment planning: NCCN Staging comments: Surgical resection       INTERVAL HISTORY:  Kristi Romero is here today for repeat clinical assessment prior to octreotide.  She missed her injection last month as she was in the ED at [facility] with chest pain.  Evaluation did not reveal any cardiac abnormality.  Her main complaint today is persistent neuropathy.  She says she does not understand this.  I reviewed her neurologist conclusions regarding this.. She denies fevers or chills. She reports severe neuropathic pain. Her appetite is good. Her weight has increased 5 pounds over last 3 months .  REVIEW OF SYSTEMS:  Review of Systems  Constitutional:  Negative for appetite change, chills, fatigue, fever and unexpected weight change.  HENT:   Negative for lump/mass, mouth sores and sore throat.   Respiratory:  Negative for cough and shortness of breath.   Cardiovascular:  Negative for chest pain and leg swelling.  Gastrointestinal:  Negative for abdominal pain, blood in stool, constipation, diarrhea, nausea and vomiting.  Endocrine: Negative for hot flashes.  Genitourinary:  Negative for difficulty urinating, dysuria, frequency and hematuria.   Musculoskeletal:  Positive for gait problem (in a wheelchair due to neuropathy). Negative for arthralgias, back pain, myalgias and neck pain.  Skin:  Negative for rash.  Neurological:  Positive for gait problem (in a wheelchair due to neuropathy) and numbness (right lower extremity numbness and burning from neuropathy). Negative for dizziness and headaches.  Hematological:  Negative for adenopathy. Does not bruise/bleed easily.  Psychiatric/Behavioral:  Negative for depression and sleep disturbance. The patient is not  nervous/anxious.      VITALS:  Blood pressure 130/74, pulse 63, temperature 98.6 F (37 C), temperature source Oral, resp. rate 16, height 5\' 4"  (1.626 m), weight 192 lb (87.1 kg), SpO2 99%.  Wt Readings from Last 3 Encounters:  07/30/23 192 lb (87.1 kg)  05/28/23 187 lb (84.8 kg)  04/30/23 196 lb 11.2 oz (89.2 kg)    Body mass index is 32.96 kg/m.  Performance status (ECOG): 3 - Symptomatic, >50% confined to bed  PHYSICAL EXAM:  Physical Exam Vitals and nursing note reviewed.  Constitutional:      General: She is not in acute distress.    Appearance: Normal appearance.  HENT:     Head: Normocephalic and atraumatic.     Mouth/Throat:     Mouth: Mucous membranes are moist.     Pharynx: Oropharynx is clear. No oropharyngeal exudate or posterior oropharyngeal erythema.  Eyes:     General: No scleral icterus.    Extraocular Movements: Extraocular movements intact.     Conjunctiva/sclera: Conjunctivae normal.     Pupils: Pupils are equal, round, and reactive to light.  Cardiovascular:     Rate and Rhythm: Normal rate and regular rhythm.     Heart sounds: Normal heart sounds. No murmur heard.    No friction rub. No gallop.  Pulmonary:     Effort: Pulmonary effort  is normal.     Breath sounds: Normal breath sounds. No wheezing, rhonchi or rales.  Abdominal:     General: There is no distension.     Palpations: Abdomen is soft. There is no hepatomegaly, splenomegaly or mass.     Tenderness: There is no abdominal tenderness.  Musculoskeletal:        General: Normal range of motion.     Cervical back: Normal range of motion and neck supple. No tenderness.     Right lower leg: No edema.     Left lower leg: No edema.  Lymphadenopathy:     Cervical: No cervical adenopathy.     Upper Body:     Right upper body: No supraclavicular or axillary adenopathy.     Left upper body: No supraclavicular or axillary adenopathy.     Lower Body: No right inguinal adenopathy. No left inguinal  adenopathy.  Skin:    General: Skin is warm and dry.     Coloration: Skin is not jaundiced.     Findings: No rash.  Neurological:     Mental Status: She is alert and oriented to person, place, and time.     Cranial Nerves: No cranial nerve deficit.  Psychiatric:        Mood and Affect: Mood normal.        Behavior: Behavior normal.        Thought Content: Thought content normal.     LABS:      Latest Ref Rng & Units 07/30/2023    1:03 PM 04/30/2023   10:17 AM 02/04/2023    2:10 PM  CBC  WBC 4.0 - 10.5 K/uL 11.2  5.7  9.5   Hemoglobin 12.0 - 15.0 g/dL 16.1  09.6  04.5   Hematocrit 36.0 - 46.0 % 38.2  37.9  43.8   Platelets 150 - 400 K/uL 253  286  244       Latest Ref Rng & Units 07/30/2023    1:03 PM 04/30/2023   10:17 AM 02/04/2023    2:10 PM  CMP  Glucose 70 - 99 mg/dL 83  409  89   BUN 8 - 23 mg/dL 10  5  9    Creatinine 0.44 - 1.00 mg/dL 8.11  9.14  7.82   Sodium 135 - 145 mmol/L 139  139  139   Potassium 3.5 - 5.1 mmol/L 3.8  2.8  3.5   Chloride 98 - 111 mmol/L 102  99  101   CO2 22 - 32 mmol/L 28  32  28   Calcium 8.9 - 10.3 mg/dL 9.5  9.1  9.6   Total Protein 6.5 - 8.1 g/dL 7.4  7.3  8.3   Total Bilirubin 0.0 - 1.2 mg/dL 0.2  0.4  0.4   Alkaline Phos 38 - 126 U/L 119  110  100   AST 15 - 41 U/L 16  16  19    ALT 0 - 44 U/L 9  <5  14     Lab Results  Component Value Date   TOTALPROTELP 6.6 06/01/2020   ALBUMINELP 3.1 06/01/2020   A1GS 0.3 06/01/2020   A2GS 0.8 06/01/2020   BETS 1.1 06/01/2020   GAMS 1.4 06/01/2020   MSPIKE Not Observed 06/01/2020   SPEI Comment 06/01/2020   Lab Results  Component Value Date   TIBC 244 (L) 09/06/2021   FERRITIN 873 (H) 09/06/2021   IRONPCTSAT 9 (L) 09/06/2021    STUDIES:  No results found.  HISTORY:   Past Medical History:  Diagnosis Date   Abdominal pain, epigastric    Accident occurring in home 05/07/2016   Acute respiratory failure with hypoxia (HCC) 09/06/2021   Adrenal adenoma 03/16/2018   Anxiety and  depression 03/16/2018   BMI 26.0-26.9,adult    Cervicogenic headache 10/14/2016   Chest pain, unspecified    Chronic abdominal pain and nausea  09/06/2021   Chronic back pain    Constipation 04/08/2021   Dizziness 10/14/2016   Elevated lipase 01/13/2017   Formatting of this note might be different from the original. 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   Normocytic anemia 09/06/2021   Numbness    Numbness and tingling 10/14/2016   Pulmonary embolism with acute respiratory failure with hypoxia  09/06/2021   Restless leg syndrome    Sleeping difficulty 04/18/2016   Spondylosis, cervical, 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 Dr Arrie Aran Galt    COLONOSCOPY  09/05/2020   Dr Rayfield Citizen Benign neoplasm of transverse colon. Benign neoplasm of descending colon.   ESOPHAGOGASTRODUODENOSCOPY  02/2018   ESOPHAGOGASTRODUODENOSCOPY (EGD) WITH PROPOFOL N/A 03/16/2018   Procedure: ESOPHAGOGASTRODUODENOSCOPY (EGD) WITH PROPOFOL;  Surgeon: Meryl Dare, MD;  Location: [facility] ENDOSCOPY;   Service: Endoscopy;  Laterality: N/A;   NECK SURGERY     PARTIAL HYSTERECTOMY     TOE SURGERY Bilateral     Family History  Problem Relation Age of Onset   Lung cancer Father    Heart disease Father    Hypertension Father    Heart disease Mother    Hypertension Mother    Cancer Mother     Social History:  reports that she has quit smoking. She has never used smokeless tobacco. She reports current drug use. Drug: Marijuana. She reports that she does not drink alcohol.The patient is accompanied by her home health aide today.  Allergies:  Allergies  Allergen Reactions   Atorvastatin Anaphylaxis   Codeine Nausea And Vomiting, Nausea Only and Shortness Of Breath    Other reaction(s): GI Upset (intolerance)  Other Reaction(s): upset stomach   Lisinopril Anaphylaxis, Swelling and Other (See Comments)    Other Reaction(s): tongue swells up    Current Medications: Current Outpatient Medications  Medication Sig Dispense Refill   acetaminophen (TYLENOL) 500 MG tablet Take 500 mg by mouth every 6 (six) hours as needed for mild pain.     apixaban (ELIQUIS) 5 MG TABS tablet Take 1 tablet (5 mg total) by mouth 2 (two) times daily. (Patient taking differently: Take 5 mg by mouth 2 (two) times  daily. Patient voiced taking once daily) 60 tablet 0   baclofen (LIORESAL) 10 MG tablet Take 10 mg by mouth 2 (two) times daily.     diclofenac Sodium (VOLTAREN) 1 % GEL Apply topically.     dicyclomine (BENTYL) 20 MG tablet Take 20 mg by mouth 4 (four) times daily as needed for spasms.     DULoxetine (CYMBALTA) 60 MG capsule Take 60 mg by mouth daily.     Fluticasone-Umeclidin-Vilant (TRELEGY ELLIPTA) 100-62.5-25 MCG/ACT AEPB 1 puff Inhalation Once a day for 90 days     isosorbide mononitrate (IMDUR) 30 MG 24 hr tablet 1 tablet in the morning Orally Once a day     LINZESS 290 MCG CAPS capsule Take 290 mcg by mouth daily. Patient reports taking as needed     meclizine (ANTIVERT) 25 MG tablet Take 25 mg  by mouth 4 (four) times daily as needed for dizziness.     metoprolol succinate (TOPROL-XL) 25 MG 24 hr tablet 1 tablet Orally Once a day     mirtazapine (REMERON) 15 MG tablet TAKE 1 TABLET AT BEDTIME 90 tablet 3   nitroGLYCERIN (NITROSTAT) 0.4 MG SL tablet 1 tablet under the tongue and allow to dissolve as needed. Take every 5 minutes up to 3 times if chest pain persists Sublingual Three times a day     omeprazole (PRILOSEC) 40 MG capsule Take 1 capsule (40 mg total) by mouth daily. 30 capsule 1   ondansetron (ZOFRAN) 4 MG tablet Take 1 tablet (4 mg total) by mouth every 6 (six) hours. 12 tablet 0   ondansetron (ZOFRAN-ODT) 4 MG disintegrating tablet Take 4 mg by mouth 2 (two) times daily as needed for nausea or vomiting.     potassium chloride (KLOR-CON M) 10 MEQ tablet Take 1 tablet (10 mEq total) by mouth 2 (two) times daily. 60 tablet 5   predniSONE (DELTASONE) 20 MG tablet Take 20 mg by mouth daily.     pregabalin (LYRICA) 50 MG capsule Take by mouth.     traMADol (ULTRAM) 50 MG tablet Take 50 mg by mouth 2 (two) times daily as needed.     Vitamin D, Ergocalciferol, (DRISDOL) 1.25 MG (50000 UNIT) CAPS capsule Take 1 capsule by mouth once a week.     atorvastatin (LIPITOR) 40 MG tablet Take 40 mg by mouth daily.     No current facility-administered medications for this visit.

## 2023-07-30 NOTE — Telephone Encounter (Signed)
 07/30/23 Spoke with patient and confirmed next appts.

## 2023-07-31 LAB — CHROMOGRANIN A: Chromogranin A (ng/mL): 65.9 ng/mL (ref 0.0–101.8)

## 2023-08-03 ENCOUNTER — Telehealth: Payer: Self-pay

## 2023-08-03 NOTE — Telephone Encounter (Signed)
-----   Message from Adah Perl sent at 07/31/2023  5:19 PM EDT ----- Please let her know her cancer test is normal. Continue injections monthly. Keep appt in 3 months. Thanks

## 2023-08-03 NOTE — Telephone Encounter (Signed)
 Patient has been advised

## 2023-08-27 ENCOUNTER — Inpatient Hospital Stay: Attending: Hematology and Oncology

## 2023-08-27 DIAGNOSIS — C7A012 Malignant carcinoid tumor of the ileum: Secondary | ICD-10-CM | POA: Insufficient documentation

## 2023-08-27 DIAGNOSIS — Z79899 Other long term (current) drug therapy: Secondary | ICD-10-CM | POA: Insufficient documentation

## 2023-09-24 ENCOUNTER — Inpatient Hospital Stay

## 2023-09-24 VITALS — BP 130/85 | HR 64 | Temp 98.0°F | Resp 18

## 2023-09-24 DIAGNOSIS — C7A012 Malignant carcinoid tumor of the ileum: Secondary | ICD-10-CM | POA: Diagnosis present

## 2023-09-24 DIAGNOSIS — Z79899 Other long term (current) drug therapy: Secondary | ICD-10-CM | POA: Diagnosis not present

## 2023-09-24 MED ORDER — OCTREOTIDE ACETATE 20 MG IM KIT
20.0000 mg | PACK | Freq: Once | INTRAMUSCULAR | Status: AC
  Administered 2023-09-24: 20 mg via INTRAMUSCULAR
  Filled 2023-09-24: qty 1

## 2023-09-24 NOTE — Patient Instructions (Signed)
 Octreotide Injection Solution What is this medication? OCTREOTIDE (ok TREE oh tide) treats high levels of growth hormone (acromegaly). It works by reducing the amount of growth hormone your body makes. This reduces symptoms and the risk of health problems caused by too much growth hormone, such as diabetes and heart disease. It may also be used to treat diarrhea caused by neuroendocrine tumors. It works by slowing down the release of serotonin from the tumor cells. This reduces the number of bowel movements you have. This medicine may be used for other purposes; ask your health care provider or pharmacist if you have questions. COMMON BRAND NAME(S): Berline Lopes, Sandostatin What should I tell my care team before I take this medication? They need to know if you have any of these conditions: Diabetes Gallbladder disease Heart disease Kidney disease Liver disease Pancreatic disease Thyroid disease An unusual or allergic reaction to octreotide, other medications, foods, dyes, or preservatives Pregnant or trying to get pregnant Breastfeeding How should I use this medication? This medication is injected under the skin or into a vein. It is usually given by your care team in a hospital or clinic setting. If you get this medication at home, you will be taught how to prepare and give it. Use exactly as directed. Take it as directed on the prescription label at the same time every day. Keep taking it unless your care team tells you to stop. Allow the injection solution to come to room temperature before use. Do not warm it artificially. It is important that you put your used needles and syringes in a special sharps container. Do not put them in a trash can. If you do not have a sharps container, call your pharmacist or care team to get one. Talk to your care team about the use of this medication in children. Special care may be needed. Overdosage: If you think you have taken too much of this medicine  contact a poison control center or emergency room at once. NOTE: This medicine is only for you. Do not share this medicine with others. What if I miss a dose? If you miss a dose, take it as soon as you can. If it is almost time for your next dose, take only that dose. Do not take double or extra doses. What may interact with this medication? Bromocriptine Certain medications for blood pressure, heart disease, irregular heartbeat Cyclosporine Diuretics Medications for diabetes, including insulin Quinidine This list may not describe all possible interactions. Give your health care provider a list of all the medicines, herbs, non-prescription drugs, or dietary supplements you use. Also tell them if you smoke, drink alcohol, or use illegal drugs. Some items may interact with your medicine. What should I watch for while using this medication? Visit your care team for regular checks on your progress. Tell your care team if your symptoms do not start to get better or if they get worse. To help reduce irritation at the injection site, use a different site for each injection and make sure the solution is at room temperature before use. This medication may cause decreases in blood sugar. Signs of low blood sugar include chills, cool, pale skin or cold sweats, drowsiness, extreme hunger, fast heartbeat, headache, nausea, nervousness or anxiety, shakiness, trembling, unsteadiness, tiredness, or weakness. Contact your care team right away if you experience any of these symptoms. This medication may increase blood sugar. The risk may be higher in patients who already have diabetes. Ask your care team what you can  do to lower your risk of diabetes while taking this medication. You should make sure you get enough vitamin B12 while you are taking this medication. Discuss the foods you eat and the vitamins you take with your care team. What side effects may I notice from receiving this medication? Side effects that  you should report to your care team as soon as possible: Allergic reactions--skin rash, itching, hives, swelling of the face, lips, tongue, or throat Gallbladder problems--severe stomach pain, nausea, vomiting, fever Heart rhythm changes--fast or irregular heartbeat, dizziness, feeling faint or lightheaded, chest pain, trouble breathing High blood sugar (hyperglycemia)--increased thirst or amount of urine, unusual weakness or fatigue, blurry vision Low blood sugar (hypoglycemia)--tremors or shaking, anxiety, sweating, cold or clammy skin, confusion, dizziness, rapid heartbeat Low thyroid levels (hypothyroidism)--unusual weakness or fatigue, increased sensitivity to cold, constipation, hair loss, dry skin, weight gain, feelings of depression Low vitamin B12 level--pain, tingling, or numbness in the hands or feet, muscle weakness, dizziness, confusion, trouble concentrating Oily or light-colored stools, diarrhea, bloating, weight loss Pancreatitis--severe stomach pain that spreads to your back or gets worse after eating or when touched, fever, nausea, vomiting Slow heartbeat--dizziness, feeling faint or lightheaded, confusion, trouble breathing, unusual weakness or fatigue Side effects that usually do not require medical attention (report these to your care team if they continue or are bothersome): Diarrhea Dizziness Headache Nausea Pain, redness, or irritation at injection site Stomach pain This list may not describe all possible side effects. Call your doctor for medical advice about side effects. You may report side effects to FDA at 1-800-FDA-1088. Where should I keep my medication? Keep out of the reach of children and pets. Store in the refrigerator. Protect from light. Allow to come to room temperature naturally. Do not use artificial heat. If protected from light, the injection may be stored between 20 and 30 degrees C (70 and 86 degrees F) for 14 days. After the initial use, throw away  any unused portion of a multiple dose vial after 14 days. Get rid of any unused portions of the ampules after use. To get rid of medications that are no longer needed or have expired: Take the medication to a medication take-back program. Ask your pharmacy or law enforcement to find a location. If you cannot return the medication, ask your pharmacist or care team how to get rid of the medication safely. NOTE: This sheet is a summary. It may not cover all possible information. If you have questions about this medicine, talk to your doctor, pharmacist, or health care provider.  2024 [employer]/Gold Standard (2023-03-27 00:00:00)

## 2023-10-21 ENCOUNTER — Other Ambulatory Visit: Payer: Self-pay | Admitting: Oncology

## 2023-10-21 DIAGNOSIS — C7A012 Malignant carcinoid tumor of the ileum: Secondary | ICD-10-CM

## 2023-10-22 ENCOUNTER — Inpatient Hospital Stay

## 2023-10-22 ENCOUNTER — Other Ambulatory Visit: Payer: Self-pay | Admitting: Oncology

## 2023-10-22 ENCOUNTER — Other Ambulatory Visit: Payer: Self-pay

## 2023-10-22 ENCOUNTER — Encounter: Payer: Self-pay | Admitting: Oncology

## 2023-10-22 ENCOUNTER — Inpatient Hospital Stay: Attending: Hematology and Oncology | Admitting: Oncology

## 2023-10-22 VITALS — BP 132/89 | HR 62 | Temp 98.0°F | Resp 16 | Ht 64.0 in | Wt 183.3 lb

## 2023-10-22 DIAGNOSIS — G2581 Restless legs syndrome: Secondary | ICD-10-CM | POA: Insufficient documentation

## 2023-10-22 DIAGNOSIS — Z7901 Long term (current) use of anticoagulants: Secondary | ICD-10-CM | POA: Diagnosis not present

## 2023-10-22 DIAGNOSIS — J9601 Acute respiratory failure with hypoxia: Secondary | ICD-10-CM | POA: Insufficient documentation

## 2023-10-22 DIAGNOSIS — Z79899 Other long term (current) drug therapy: Secondary | ICD-10-CM | POA: Diagnosis not present

## 2023-10-22 DIAGNOSIS — C7A012 Malignant carcinoid tumor of the ileum: Secondary | ICD-10-CM

## 2023-10-22 DIAGNOSIS — I2699 Other pulmonary embolism without acute cor pulmonale: Secondary | ICD-10-CM | POA: Insufficient documentation

## 2023-10-22 LAB — CMP (CANCER CENTER ONLY)
ALT: <5 U/L (ref 0–44)
AST: 13 U/L — ABNORMAL LOW (ref 15–41)
Albumin: 4.1 g/dL (ref 3.5–5.0)
Alkaline Phosphatase: 140 U/L — ABNORMAL HIGH (ref 38–126)
Anion gap: 11 (ref 5–15)
BUN: 6 mg/dL — ABNORMAL LOW (ref 8–23)
CO2: 26 mmol/L (ref 22–32)
Calcium: 9.7 mg/dL (ref 8.9–10.3)
Chloride: 103 mmol/L (ref 98–111)
Creatinine: 0.79 mg/dL (ref 0.44–1.00)
GFR, Estimated: >60 mL/min (ref >60)
Glucose, Bld: 78 mg/dL (ref 70–99)
Potassium: 3.9 mmol/L (ref 3.5–5.1)
Sodium: 140 mmol/L (ref 135–145)
Total Bilirubin: 0.4 mg/dL (ref 0.0–1.2)
Total Protein: 7.8 g/dL (ref 6.5–8.1)

## 2023-10-22 LAB — CBC WITH DIFFERENTIAL (CANCER CENTER ONLY)
Abs Immature Granulocytes: 0.03 10*3/uL (ref 0.00–0.07)
Basophils Absolute: 0 10*3/uL (ref 0.0–0.1)
Basophils Relative: 0 %
Eosinophils Absolute: 0 10*3/uL (ref 0.0–0.5)
Eosinophils Relative: 0 %
HCT: 41.7 % (ref 36.0–46.0)
Hemoglobin: 13.4 g/dL (ref 12.0–15.0)
Immature Granulocytes: 0 %
Lymphocytes Relative: 46 %
Lymphs Abs: 5.4 10*3/uL — ABNORMAL HIGH (ref 0.7–4.0)
MCH: 30.4 pg (ref 26.0–34.0)
MCHC: 32.1 g/dL (ref 30.0–36.0)
MCV: 94.6 fL (ref 80.0–100.0)
Monocytes Absolute: 0.9 10*3/uL (ref 0.1–1.0)
Monocytes Relative: 8 %
Neutro Abs: 5.3 10*3/uL (ref 1.7–7.7)
Neutrophils Relative %: 46 %
Platelet Count: 239 10*3/uL (ref 150–400)
RBC: 4.41 MIL/uL (ref 3.87–5.11)
RDW: 13.2 % (ref 11.5–15.5)
WBC Count: 11.7 10*3/uL — ABNORMAL HIGH (ref 4.0–10.5)
nRBC: 0 % (ref 0.0–0.2)

## 2023-10-22 MED ORDER — OCTREOTIDE ACETATE 20 MG IM KIT
20.0000 mg | PACK | Freq: Once | INTRAMUSCULAR | Status: AC
  Administered 2023-10-22: 20 mg via INTRAMUSCULAR
  Filled 2023-10-22: qty 1

## 2023-10-22 NOTE — Progress Notes (Signed)
 [facility]  [address] 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  Seven of eighteen lymph nodes were involved (7/18).  Primary tumor measured 2.2 x 1.7 x 1.5 cm, with three apparent vascular metastases including a nodule up to 3 cm with metastatic tumor present within 1 mm of the inked radial margin.  In the distal appendix there was also a separate 3 mm carcinoid tumor.  This was a staged as a pT3pN1 with low mitotic rate (0 per 10 high powered fields).  [name] was sporadic.  We began seeing her in February 2022 for carcinoid syndrome symptoms.  Her chromogranin A was elevated at 262.  CT chest, abdomen and pelvis did not reveal any evidence of recurrent or metastatic disease.  She was started on octreotide  every 4 weeks.  She had normalization of her chromogranin A and this has remained normal.  Routine CT imaging has remained without evidence of recurrent or metastatic disease, most recently in May 2024.  She is not always compliant with her octreotide  injections.  She missed her injection in February as she was in the ED at [facility] for chest pain.  Evaluation did not reveal cardiac abnormality.  She will continue octreotide  injections every 4 weeks.  It has been over a year since her last scan and so I will schedule her for CT of chest, abdomen and pelvis.  I will also draw a chromogranin A today.   Neuropathy She saw Dr. Jama, neurology, [facility] in October 2023.  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     Pulmonary embolism with  acute respiratory failure with hypoxia  She was found to have right lower lobe subsegmental nonocclusive pulmonary embolism incidentally in May, for which she was placed on Eliquis  twice daily.  Dr. Mardee recommended continuing Eliquis  for at least 6 months. We had thought this was discontinued, but she continues on Eliquis  5 mg daily per her PCP.     Plan: She has a slightly elevated WBC of 11.7, hemoglobin of 13.4, and platelet count of 239,000. Her CMP is normal other than a low-normal glucose of 78 and elevated of 140. I explained to her the symptoms of hypoglycemia and what to look out for. She will receive her Sandostatin  injection today and every 4 weeks. I will schedule her for a CT chest, abdomen, and pelvis since it has been over a year since her last scans.  I will also draw a chromogranin A today.  I will see her back in 12 weeks with CBC, CMP, and chromogranin A. The patient understands the plans discussed today and is in agreement with them.  She knows to contact our office if she develops concerns prior to her next appointment.  I provided 21 minutes of face-to-face time during this encounter and > 50% was spent counseling as documented under my assessment and plan.   Wanda VEAR Cornish, MD  [facility] A DEPT OF [facility] [address] [place] [address] Dept: [phone] Dept Fax: [phone]   No orders  of the defined types were placed in this encounter.   CHIEF COMPLAINT:  CC: Malignant carcinoid  Current Treatment: Octreotide  every 4 weeks  HISTORY OF PRESENT ILLNESS:   Oncology History  Malignant carcinoid tumor of ileum (HCC)  04/28/2014 Initial Diagnosis   Malignant carcinoid tumor of ileum (HCC)   02/02/2015 Cancer Staging   Staging form: Neuroendocrine Tumor - Duodenum/Ampulla/Jejunum/Illeum, AJCC 7th Edition - Clinical stage from 02/02/2015: Stage IIIB (T3(3), N1, M0) - Signed by Cornelius Wanda DEL, MD on 05/28/2020 Staged by: Managing physician Diagnostic confirmation: Positive histology Specimen type: Excision Histopathologic type: Carcinoid tumor, NOS Stage prefix: Initial diagnosis Tumor size (mm): 22 Multiple tumors: Yes Number of tumors: 3 Histologic grade (G): G1 Lymph-vascular invasion (LVI): LVI not present (absent)/not identified Residual tumor (R): R0 - None Stage used in treatment planning: Yes National guidelines used in treatment planning: Yes Type of national guideline used in treatment planning: NCCN Staging comments: Surgical resection     INTERVAL HISTORY:  Britne is here today for repeat clinical assessment for her malignant carcinoid. Patient states that she doesn't feel the best and complains of back pain rating 8/10, abdominal, and feet pain. She has a slightly elevated WBC of 11.7, hemoglobin of 13.4, and platelet count of 239,000. Her CMP is normal other than a low-normal glucose of 78 and elevated of 140. I explained to her the symptoms of hypoglycemia and what to look out for. She will receive her Sandostatin  injection today and every 4 weeks. I will schedule her for a CT chest, abdomen, and pelvis since it has been over 1 year.  I will also draw a chromogranin A today.  I will see her back in 12 weeks with CBC, CMP, and chromogranin A. She denies fever, chills, night sweats, or other signs of infection. She denies cardiorespiratory and gastrointestinal issues.  Her appetite is not the best and Her weight has decreased 9 pounds over last 2 months.   REVIEW OF SYSTEMS:  Review of Systems  Constitutional:  Positive for appetite change. Negative for chills, fatigue, fever and unexpected weight change.  HENT:  Negative.  Negative for lump/mass, mouth sores and sore throat.   Eyes: Negative.   Respiratory: Negative.  Negative for chest tightness, cough, hemoptysis, shortness of breath and wheezing.   Cardiovascular: Negative.  Negative for chest pain,  leg swelling and palpitations.  Gastrointestinal:  Positive for abdominal pain. Negative for abdominal distention, blood in stool, constipation, diarrhea, nausea and vomiting.  Endocrine: Negative.  Negative for hot flashes.  Genitourinary: Negative.  Negative for difficulty urinating, dysuria, frequency and hematuria.   Musculoskeletal:  Positive for back pain (8/10) and gait problem (in a wheelchair due to neuropathy). Negative for arthralgias, flank pain, myalgias and neck pain.  Skin: Negative.  Negative for rash.  Neurological:  Positive for gait problem (in a wheelchair due to neuropathy) and numbness (right lower extremity numbness and burning from neuropathy). Negative for dizziness, extremity weakness, headaches, light-headedness, seizures and speech difficulty.  Hematological: Negative.  Negative for adenopathy. Does not bruise/bleed easily.  Psychiatric/Behavioral: Negative.  Negative for depression and sleep disturbance. The patient is not nervous/anxious.      VITALS:  Blood pressure 132/89, pulse 62, temperature 98 F (36.7 C), temperature source Oral, resp. rate 16, height 5' 4 (1.626 m), weight 183 lb 4.8 oz (83.1 kg), SpO2 100%.  Wt Readings from Last 3 Encounters:  10/22/23 183 lb 4.8 oz (83.1 kg)  07/30/23 192 lb (87.1 kg)  05/28/23 187  lb (84.8 kg)    Body mass index is 31.46 kg/m.  Performance status (ECOG): 2 - Symptomatic, <50% confined to bed  PHYSICAL EXAM:  Physical Exam Vitals and nursing note reviewed.  Constitutional:      General: She is not in acute distress.    Appearance: Normal appearance.  HENT:     Head: Normocephalic and atraumatic.     Mouth/Throat:     Mouth: Mucous membranes are moist.     Pharynx: Oropharynx is clear. No oropharyngeal exudate or posterior oropharyngeal erythema.  Eyes:     General: No scleral icterus.    Extraocular Movements: Extraocular movements intact.     Conjunctiva/sclera: Conjunctivae normal.     Pupils: Pupils  are equal, round, and reactive to light.  Cardiovascular:     Rate and Rhythm: Normal rate and regular rhythm.     Heart sounds: Normal heart sounds. No murmur heard.    No friction rub. No gallop.  Pulmonary:     Effort: Pulmonary effort is normal.     Breath sounds: Normal breath sounds. No wheezing, rhonchi or rales.  Abdominal:     General: There is no distension.     Palpations: Abdomen is soft. There is no hepatomegaly, splenomegaly or mass.     Tenderness: There is no abdominal tenderness.  Musculoskeletal:        General: Normal range of motion.     Cervical back: Normal range of motion and neck supple. No tenderness.     Right lower leg: No edema.     Left lower leg: No edema.  Lymphadenopathy:     Cervical: No cervical adenopathy.     Upper Body:     Right upper body: No supraclavicular or axillary adenopathy.     Left upper body: No supraclavicular or axillary adenopathy.     Lower Body: No right inguinal adenopathy. No left inguinal adenopathy.  Skin:    General: Skin is warm and dry.     Coloration: Skin is not jaundiced.     Findings: No rash.  Neurological:     Mental Status: She is alert and oriented to person, place, and time.     Cranial Nerves: No cranial nerve deficit.  Psychiatric:        Mood and Affect: Mood normal.        Behavior: Behavior normal.        Thought Content: Thought content normal.     LABS:      Latest Ref Rng & Units 10/22/2023    1:50 PM 07/30/2023    1:03 PM 04/30/2023   10:17 AM  CBC  WBC 4.0 - 10.5 K/uL 11.7  11.2  5.7   Hemoglobin 12.0 - 15.0 g/dL 86.5  87.3  87.5   Hematocrit 36.0 - 46.0 % 41.7  38.2  37.9   Platelets 150 - 400 K/uL 239  253  286       Latest Ref Rng & Units 10/22/2023    1:50 PM 07/30/2023    1:03 PM 04/30/2023   10:17 AM  CMP  Glucose 70 - 99 mg/dL 78  83  897   BUN 8 - 23 mg/dL 6  10  5    Creatinine 0.44 - 1.00 mg/dL 9.20  9.33  9.33   Sodium 135 - 145 mmol/L 140  139  139   Potassium 3.5 - 5.1 mmol/L  3.9  3.8  2.8   Chloride 98 - 111 mmol/L 103  102  99   CO2 22 - 32 mmol/L 26  28  32   Calcium 8.9 - 10.3 mg/dL 9.7  9.5  9.1   Total Protein 6.5 - 8.1 g/dL 7.8  7.4  7.3   Total Bilirubin 0.0 - 1.2 mg/dL 0.4  0.2  0.4   Alkaline Phos 38 - 126 U/L 140  119  110   AST 15 - 41 U/L 13  16  16    ALT 0 - 44 U/L <5  9  <5     Lab Results  Component Value Date   TOTALPROTELP 6.6 06/01/2020   ALBUMINELP 3.1 06/01/2020   A1GS 0.3 06/01/2020   A2GS 0.8 06/01/2020   BETS 1.1 06/01/2020   GAMS 1.4 06/01/2020   MSPIKE Not Observed 06/01/2020   SPEI Comment 06/01/2020   Lab Results  Component Value Date   TIBC 244 (L) 09/06/2021   FERRITIN 873 (H) 09/06/2021   IRONPCTSAT 9 (L) 09/06/2021    STUDIES:  CT CHEST ABDOMEN PELVIS W CONTRAST Result Date: 10/30/2023 CLINICAL DATA:  carcinoid tumor of ileum, neuroendocrine tumor, follow-up. * Tracking Code: BO * EXAM: CT CHEST, ABDOMEN, AND PELVIS WITH CONTRAST TECHNIQUE: Multidetector CT imaging of the chest, abdomen and pelvis was performed following the standard protocol during bolus administration of intravenous contrast. RADIATION DOSE REDUCTION: This exam was performed according to the departmental dose-optimization program which includes automated exposure control, adjustment of the mA and/or kV according to patient size and/or use of iterative reconstruction technique. CONTRAST:  OMNIPAQUE  IOHEXOL  300 MG/ML  SOLN COMPARISON:  Multiple priors including CT December 07, 2022 FINDINGS: CT CHEST FINDINGS Cardiovascular: Aortic atherosclerosis. Enlarged main pulmonary artery. No significant pericardial effusion/thickening. Mediastinum/Nodes: No suspicious thyroid  nodule. No pathologically enlarged mediastinal, hilar or axillary lymph nodes. The esophagus is grossly unremarkable. Lungs/Pleura: Hypoventilatory change in the dependent lungs. No suspicious pulmonary nodules or masses. Scattered atelectasis/scarring. Musculoskeletal: No aggressive lytic or  blastic lesion of bone. CT ABDOMEN PELVIS FINDINGS Hepatobiliary: No suspicious hepatic lesion. Gallbladder surgically absent. Similar mild prominence of the biliary tree favored reservoir effect post cholecystectomy. Pancreas: No pancreatic ductal dilation or evidence of acute inflammation. Spleen: No splenomegaly. Adrenals/Urinary Tract: Stable 2.3 cm left adrenal nodule on image 48/301 unchanged over multiple prior examinations and favored a lipid poor adenoma. No right adrenal nodule. No hydronephrosis. Kidneys demonstrate symmetric enhancement. Urinary bladder is unremarkable for degree of distension. Stomach/Bowel: Stomach is unremarkable for degree of distension. No pathologic dilation of small or large bowel. No evidence of acute bowel inflammation. Status post subtotal right hemicolectomy with ileocolic anastomosis. No new suspicious nodularity along the suture line. Vascular/Lymphatic: Aortic atherosclerosis. Smooth IVC contours. No pathologically enlarged abdominal or pelvic lymph nodes. Reproductive: Status post hysterectomy. No adnexal masses. Other: Sequela of subcutaneous injection in the right posterior gluteal tissues. Musculoskeletal: No aggressive lytic or blastic lesion of bone. IMPRESSION: 1. Status post subtotal right hemicolectomy with ileocolic anastomosis. No evidence of local recurrence or metastatic disease in the chest, abdomen or pelvis. 2. Stable 2.3 cm left adrenal nodule unchanged over multiple prior examinations and favored a lipid poor adenoma. 3. Enlarged main pulmonary artery, which can be seen in the setting of pulmonary arterial hypertension. 4.  Aortic Atherosclerosis (ICD10-I70.0). Electronically Signed   By: Reyes Holder M.D.   On: 10/30/2023 09:10      HISTORY:   Past Medical History:  Diagnosis Date   Abdominal pain, epigastric    Accident occurring in home 05/07/2016   Acute respiratory failure  with hypoxia (HCC) 09/06/2021   Adrenal adenoma 03/16/2018    Anxiety and depression 03/16/2018   BMI 26.0-26.9,adult    Cervicogenic headache 10/14/2016   Chest pain, unspecified    Chronic abdominal pain and nausea  09/06/2021   Chronic back pain    Constipation 04/08/2021   Dizziness 10/14/2016   Elevated lipase 01/13/2017   Formatting of this note might be different from the original. 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   Normocytic anemia 09/06/2021   Numbness    Numbness and tingling 10/14/2016   Pulmonary embolism with acute respiratory failure with hypoxia  09/06/2021   Restless leg syndrome    Sleeping difficulty 04/18/2016   Spondylosis, cervical, with myelopathy 12/28/2014   Thyroid  nodule 07/22/2016   Urinary urgency 05/07/2016   Ventral hernia 05/07/2016   Vitamin D deficiency 01/13/2017   Vomiting 03/17/2018    Past Surgical History:  Procedure Laterality Date   BIOPSY  03/16/2018   Procedure: BIOPSY;  Surgeon: Aneita Gwendlyn DASEN, MD;  Location: [facility] ENDOSCOPY;  Service: Endoscopy;;   COLONOSCOPY  2018   said they removed a mass. Dr Mckinley Money McCook    COLONOSCOPY  09/05/2020   Dr Anette Benign neoplasm of transverse colon. Benign neoplasm of descending colon.   ESOPHAGOGASTRODUODENOSCOPY  02/2018   ESOPHAGOGASTRODUODENOSCOPY (EGD) WITH PROPOFOL  N/A 03/16/2018   Procedure: ESOPHAGOGASTRODUODENOSCOPY (EGD) WITH PROPOFOL ;  Surgeon: Aneita Gwendlyn DASEN, MD;  Location[facility]  ENDOSCOPY;  Service: Endoscopy;  Laterality: N/A;   NECK SURGERY     PARTIAL HYSTERECTOMY     TOE SURGERY Bilateral     Family History  Problem Relation Age of Onset   Lung cancer Father    Heart disease Father    Hypertension Father    Heart disease Mother    Hypertension Mother    Cancer Mother     Social History:  reports that she has quit smoking. She has never used smokeless tobacco. She reports current drug use. Drug: Marijuana. She reports that she does not drink alcohol.The patient is accompanied by her home health aide today.  Allergies:  Allergies  Allergen Reactions   Atorvastatin Anaphylaxis   Codeine Nausea And Vomiting, Nausea Only and Shortness Of Breath    Other reaction(s): GI Upset (intolerance)  Other Reaction(s): upset stomach  Other reaction(s): GI Upset (intolerance)  Other Reaction(s): upset stomach   Lisinopril Anaphylaxis, Swelling and Other (See Comments)    Other Reaction(s): tongue swells up    Current Medications: Current Outpatient Medications  Medication Sig Dispense Refill   acetaminophen  (TYLENOL ) 500 MG tablet Take 500 mg by mouth every 6 (six) hours as needed for mild pain.     apixaban  (ELIQUIS ) 5 MG TABS tablet Take 1 tablet (5 mg total) by mouth 2 (two) times daily. (Patient taking differently: Take 5 mg by mouth 2 (two) times daily. Patient voiced taking once daily) 60 tablet 0   atorvastatin (LIPITOR) 40 MG tablet  Take 40 mg by mouth daily.     baclofen (LIORESAL) 10 MG tablet Take 10 mg by mouth 2 (two) times daily.     diclofenac Sodium (VOLTAREN) 1 % GEL Apply topically.     dicyclomine  (BENTYL ) 20 MG tablet Take 20 mg by mouth 4 (four) times daily as needed for spasms.     DULoxetine  (CYMBALTA ) 60 MG capsule Take 60 mg by mouth daily.     Fluticasone-Umeclidin-Vilant (TRELEGY ELLIPTA) 100-62.5-25 MCG/ACT AEPB 1 puff Inhalation Once a day for 90 days     isosorbide mononitrate (IMDUR) 30 MG 24 hr tablet 1 tablet in the morning  Orally Once a day     LINZESS  290 MCG CAPS capsule Take 290 mcg by mouth daily. Patient reports taking as needed     meclizine  (ANTIVERT ) 25 MG tablet Take 25 mg by mouth 4 (four) times daily as needed for dizziness.     metoprolol  succinate (TOPROL -XL) 25 MG 24 hr tablet 1 tablet Orally Once a day     mirtazapine  (REMERON ) 15 MG tablet TAKE 1 TABLET AT BEDTIME 90 tablet 3   nitroGLYCERIN (NITROSTAT) 0.4 MG SL tablet 1 tablet under the tongue and allow to dissolve as needed. Take every 5 minutes up to 3 times if chest pain persists Sublingual Three times a day     omeprazole  (PRILOSEC) 40 MG capsule Take 1 capsule (40 mg total) by mouth daily. 30 capsule 1   ondansetron  (ZOFRAN ) 4 MG tablet Take 1 tablet (4 mg total) by mouth every 6 (six) hours. 12 tablet 0   ondansetron  (ZOFRAN -ODT) 4 MG disintegrating tablet Take 4 mg by mouth 2 (two) times daily as needed for nausea or vomiting.     potassium chloride  (KLOR-CON  M) 10 MEQ tablet Take 1 tablet (10 mEq total) by mouth 2 (two) times daily. 60 tablet 5   predniSONE (DELTASONE) 20 MG tablet Take 20 mg by mouth daily.     pregabalin (LYRICA) 50 MG capsule Take by mouth.     traMADol (ULTRAM) 50 MG tablet Take 50 mg by mouth 2 (two) times daily as needed.     No current facility-administered medications for this visit.    I,Jasmine M Lassiter,acting as a scribe for Wanda VEAR Cornish, MD.,have documented all relevant documentation on the behalf of Wanda VEAR Cornish, MD,as directed by  Wanda VEAR Cornish, MD while in the presence of Wanda VEAR Cornish, MD.

## 2023-10-22 NOTE — Patient Instructions (Signed)
 Octreotide Injection Solution What is this medication? OCTREOTIDE (ok TREE oh tide) treats high levels of growth hormone (acromegaly). It works by reducing the amount of growth hormone your body makes. This reduces symptoms and the risk of health problems caused by too much growth hormone, such as diabetes and heart disease. It may also be used to treat diarrhea caused by neuroendocrine tumors. It works by slowing down the release of serotonin from the tumor cells. This reduces the number of bowel movements you have. This medicine may be used for other purposes; ask your health care provider or pharmacist if you have questions. COMMON BRAND NAME(S): Berline Lopes, Sandostatin What should I tell my care team before I take this medication? They need to know if you have any of these conditions: Diabetes Gallbladder disease Heart disease Kidney disease Liver disease Pancreatic disease Thyroid disease An unusual or allergic reaction to octreotide, other medications, foods, dyes, or preservatives Pregnant or trying to get pregnant Breastfeeding How should I use this medication? This medication is injected under the skin or into a vein. It is usually given by your care team in a hospital or clinic setting. If you get this medication at home, you will be taught how to prepare and give it. Use exactly as directed. Take it as directed on the prescription label at the same time every day. Keep taking it unless your care team tells you to stop. Allow the injection solution to come to room temperature before use. Do not warm it artificially. It is important that you put your used needles and syringes in a special sharps container. Do not put them in a trash can. If you do not have a sharps container, call your pharmacist or care team to get one. Talk to your care team about the use of this medication in children. Special care may be needed. Overdosage: If you think you have taken too much of this medicine  contact a poison control center or emergency room at once. NOTE: This medicine is only for you. Do not share this medicine with others. What if I miss a dose? If you miss a dose, take it as soon as you can. If it is almost time for your next dose, take only that dose. Do not take double or extra doses. What may interact with this medication? Bromocriptine Certain medications for blood pressure, heart disease, irregular heartbeat Cyclosporine Diuretics Medications for diabetes, including insulin Quinidine This list may not describe all possible interactions. Give your health care provider a list of all the medicines, herbs, non-prescription drugs, or dietary supplements you use. Also tell them if you smoke, drink alcohol, or use illegal drugs. Some items may interact with your medicine. What should I watch for while using this medication? Visit your care team for regular checks on your progress. Tell your care team if your symptoms do not start to get better or if they get worse. To help reduce irritation at the injection site, use a different site for each injection and make sure the solution is at room temperature before use. This medication may cause decreases in blood sugar. Signs of low blood sugar include chills, cool, pale skin or cold sweats, drowsiness, extreme hunger, fast heartbeat, headache, nausea, nervousness or anxiety, shakiness, trembling, unsteadiness, tiredness, or weakness. Contact your care team right away if you experience any of these symptoms. This medication may increase blood sugar. The risk may be higher in patients who already have diabetes. Ask your care team what you can  do to lower your risk of diabetes while taking this medication. You should make sure you get enough vitamin B12 while you are taking this medication. Discuss the foods you eat and the vitamins you take with your care team. What side effects may I notice from receiving this medication? Side effects that  you should report to your care team as soon as possible: Allergic reactions--skin rash, itching, hives, swelling of the face, lips, tongue, or throat Gallbladder problems--severe stomach pain, nausea, vomiting, fever Heart rhythm changes--fast or irregular heartbeat, dizziness, feeling faint or lightheaded, chest pain, trouble breathing High blood sugar (hyperglycemia)--increased thirst or amount of urine, unusual weakness or fatigue, blurry vision Low blood sugar (hypoglycemia)--tremors or shaking, anxiety, sweating, cold or clammy skin, confusion, dizziness, rapid heartbeat Low thyroid levels (hypothyroidism)--unusual weakness or fatigue, increased sensitivity to cold, constipation, hair loss, dry skin, weight gain, feelings of depression Low vitamin B12 level--pain, tingling, or numbness in the hands or feet, muscle weakness, dizziness, confusion, trouble concentrating Oily or light-colored stools, diarrhea, bloating, weight loss Pancreatitis--severe stomach pain that spreads to your back or gets worse after eating or when touched, fever, nausea, vomiting Slow heartbeat--dizziness, feeling faint or lightheaded, confusion, trouble breathing, unusual weakness or fatigue Side effects that usually do not require medical attention (report these to your care team if they continue or are bothersome): Diarrhea Dizziness Headache Nausea Pain, redness, or irritation at injection site Stomach pain This list may not describe all possible side effects. Call your doctor for medical advice about side effects. You may report side effects to FDA at 1-800-FDA-1088. Where should I keep my medication? Keep out of the reach of children and pets. Store in the refrigerator. Protect from light. Allow to come to room temperature naturally. Do not use artificial heat. If protected from light, the injection may be stored between 20 and 30 degrees C (70 and 86 degrees F) for 14 days. After the initial use, throw away  any unused portion of a multiple dose vial after 14 days. Get rid of any unused portions of the ampules after use. To get rid of medications that are no longer needed or have expired: Take the medication to a medication take-back program. Ask your pharmacy or law enforcement to find a location. If you cannot return the medication, ask your pharmacist or care team how to get rid of the medication safely. NOTE: This sheet is a summary. It may not cover all possible information. If you have questions about this medicine, talk to your doctor, pharmacist, or health care provider.  2024 [employer]/Gold Standard (2023-03-27 00:00:00)

## 2023-10-24 LAB — CHROMOGRANIN A: Chromogranin A (ng/mL): 40.3 ng/mL (ref 0.0–101.8)

## 2023-10-27 ENCOUNTER — Telehealth: Payer: Self-pay | Admitting: Oncology

## 2023-10-27 NOTE — Telephone Encounter (Signed)
 Patient has been scheduled for follow-up visit per 10/26/23 LOS.  Pt aware of scheduled appt details.

## 2023-10-29 ENCOUNTER — Ambulatory Visit (HOSPITAL_BASED_OUTPATIENT_CLINIC_OR_DEPARTMENT_OTHER)
Admission: RE | Admit: 2023-10-29 | Discharge: 2023-10-29 | Disposition: A | Discharge status: Home / Self Care | Source: Ambulatory Visit | Attending: Oncology | Admitting: Oncology

## 2023-10-29 DIAGNOSIS — C7A012 Malignant carcinoid tumor of the ileum: Secondary | ICD-10-CM | POA: Diagnosis not present

## 2023-10-29 MED ORDER — IOHEXOL 300 MG/ML  SOLN
100.0000 mL | Freq: Once | INTRAMUSCULAR | Status: AC | PRN
  Administered 2023-10-29: 100 mL via INTRAVENOUS

## 2023-11-05 ENCOUNTER — Encounter: Payer: Self-pay | Admitting: Oncology

## 2023-11-06 ENCOUNTER — Telehealth: Payer: Self-pay

## 2023-11-06 NOTE — Telephone Encounter (Signed)
-----   Message from Wanda VEAR Cornish sent at 11/05/2023  7:36 PM EDT ----- Regarding: call Tell her CT scans look good, no sign of cancer, no change, nothing to explain her stomach pain.

## 2023-11-19 ENCOUNTER — Other Ambulatory Visit: Payer: Self-pay | Admitting: Oncology

## 2023-11-19 ENCOUNTER — Inpatient Hospital Stay: Attending: Hematology and Oncology

## 2023-11-19 VITALS — BP 111/82 | HR 81 | Temp 98.4°F | Resp 18

## 2023-11-19 DIAGNOSIS — Z79899 Other long term (current) drug therapy: Secondary | ICD-10-CM | POA: Diagnosis not present

## 2023-11-19 DIAGNOSIS — N63 Unspecified lump in unspecified breast: Secondary | ICD-10-CM

## 2023-11-19 DIAGNOSIS — C7A012 Malignant carcinoid tumor of the ileum: Secondary | ICD-10-CM | POA: Insufficient documentation

## 2023-11-19 MED ORDER — OCTREOTIDE ACETATE 20 MG IM KIT
20.0000 mg | PACK | Freq: Once | INTRAMUSCULAR | Status: AC
Start: 2023-11-19 — End: 2023-11-19
  Administered 2023-11-19: 20 mg via INTRAMUSCULAR
  Filled 2023-11-19: qty 1

## 2023-11-19 NOTE — Patient Instructions (Signed)
 Octreotide Injection Solution What is this medication? OCTREOTIDE (ok TREE oh tide) treats high levels of growth hormone (acromegaly). It works by reducing the amount of growth hormone your body makes. This reduces symptoms and the risk of health problems caused by too much growth hormone, such as diabetes and heart disease. It may also be used to treat diarrhea caused by neuroendocrine tumors. It works by slowing down the release of serotonin from the tumor cells. This reduces the number of bowel movements you have. This medicine may be used for other purposes; ask your health care provider or pharmacist if you have questions. COMMON BRAND NAME(S): Berline Lopes, Sandostatin What should I tell my care team before I take this medication? They need to know if you have any of these conditions: Diabetes Gallbladder disease Heart disease Kidney disease Liver disease Pancreatic disease Thyroid disease An unusual or allergic reaction to octreotide, other medications, foods, dyes, or preservatives Pregnant or trying to get pregnant Breastfeeding How should I use this medication? This medication is injected under the skin or into a vein. It is usually given by your care team in a hospital or clinic setting. If you get this medication at home, you will be taught how to prepare and give it. Use exactly as directed. Take it as directed on the prescription label at the same time every day. Keep taking it unless your care team tells you to stop. Allow the injection solution to come to room temperature before use. Do not warm it artificially. It is important that you put your used needles and syringes in a special sharps container. Do not put them in a trash can. If you do not have a sharps container, call your pharmacist or care team to get one. Talk to your care team about the use of this medication in children. Special care may be needed. Overdosage: If you think you have taken too much of this medicine  contact a poison control center or emergency room at once. NOTE: This medicine is only for you. Do not share this medicine with others. What if I miss a dose? If you miss a dose, take it as soon as you can. If it is almost time for your next dose, take only that dose. Do not take double or extra doses. What may interact with this medication? Bromocriptine Certain medications for blood pressure, heart disease, irregular heartbeat Cyclosporine Diuretics Medications for diabetes, including insulin Quinidine This list may not describe all possible interactions. Give your health care provider a list of all the medicines, herbs, non-prescription drugs, or dietary supplements you use. Also tell them if you smoke, drink alcohol, or use illegal drugs. Some items may interact with your medicine. What should I watch for while using this medication? Visit your care team for regular checks on your progress. Tell your care team if your symptoms do not start to get better or if they get worse. To help reduce irritation at the injection site, use a different site for each injection and make sure the solution is at room temperature before use. This medication may cause decreases in blood sugar. Signs of low blood sugar include chills, cool, pale skin or cold sweats, drowsiness, extreme hunger, fast heartbeat, headache, nausea, nervousness or anxiety, shakiness, trembling, unsteadiness, tiredness, or weakness. Contact your care team right away if you experience any of these symptoms. This medication may increase blood sugar. The risk may be higher in patients who already have diabetes. Ask your care team what you can  do to lower your risk of diabetes while taking this medication. You should make sure you get enough vitamin B12 while you are taking this medication. Discuss the foods you eat and the vitamins you take with your care team. What side effects may I notice from receiving this medication? Side effects that  you should report to your care team as soon as possible: Allergic reactions--skin rash, itching, hives, swelling of the face, lips, tongue, or throat Gallbladder problems--severe stomach pain, nausea, vomiting, fever Heart rhythm changes--fast or irregular heartbeat, dizziness, feeling faint or lightheaded, chest pain, trouble breathing High blood sugar (hyperglycemia)--increased thirst or amount of urine, unusual weakness or fatigue, blurry vision Low blood sugar (hypoglycemia)--tremors or shaking, anxiety, sweating, cold or clammy skin, confusion, dizziness, rapid heartbeat Low thyroid levels (hypothyroidism)--unusual weakness or fatigue, increased sensitivity to cold, constipation, hair loss, dry skin, weight gain, feelings of depression Low vitamin B12 level--pain, tingling, or numbness in the hands or feet, muscle weakness, dizziness, confusion, trouble concentrating Oily or light-colored stools, diarrhea, bloating, weight loss Pancreatitis--severe stomach pain that spreads to your back or gets worse after eating or when touched, fever, nausea, vomiting Slow heartbeat--dizziness, feeling faint or lightheaded, confusion, trouble breathing, unusual weakness or fatigue Side effects that usually do not require medical attention (report these to your care team if they continue or are bothersome): Diarrhea Dizziness Headache Nausea Pain, redness, or irritation at injection site Stomach pain This list may not describe all possible side effects. Call your doctor for medical advice about side effects. You may report side effects to FDA at 1-800-FDA-1088. Where should I keep my medication? Keep out of the reach of children and pets. Store in the refrigerator. Protect from light. Allow to come to room temperature naturally. Do not use artificial heat. If protected from light, the injection may be stored between 20 and 30 degrees C (70 and 86 degrees F) for 14 days. After the initial use, throw away  any unused portion of a multiple dose vial after 14 days. Get rid of any unused portions of the ampules after use. To get rid of medications that are no longer needed or have expired: Take the medication to a medication take-back program. Ask your pharmacy or law enforcement to find a location. If you cannot return the medication, ask your pharmacist or care team how to get rid of the medication safely. NOTE: This sheet is a summary. It may not cover all possible information. If you have questions about this medicine, talk to your doctor, pharmacist, or health care provider.  2024 [employer]/Gold Standard (2023-03-27 00:00:00)

## 2023-11-20 ENCOUNTER — Telehealth: Payer: Self-pay | Admitting: Oncology

## 2023-11-20 ENCOUNTER — Telehealth: Payer: Self-pay

## 2023-11-20 NOTE — Telephone Encounter (Signed)
 Diagnostic Mammogram has been scheduled for 11/23/23 @ 3:20 PM ; Checking in @ 2:50 pm   LVM notifying pt of date,time, instructions and location.

## 2023-11-20 NOTE — Telephone Encounter (Signed)
 During treatment on 11/19/23, patient informed nurse that seh thought she needed mammogram due to left breast swelling- Dr. Cornelius notified via messaging. Contacted patient today to determine date of last mammo (unknown) and if PCP has been ordering mammos- Patient reports that PCP has not ordered mammos- Patient states that she prefers [facility] for mammogram.

## 2023-11-20 NOTE — Telephone Encounter (Signed)
-----   Message from Wanda VEAR Cornish sent at 11/20/2023 12:27 PM EDT ----- Pls sched bilat screening mammo at [facility] ----- Message ----- From: Mal Olam RAMAN, RN Sent: 11/20/2023  11:09 AM EDT To: Wanda VEAR Cornish, MD  Spoke with her this am- She said her Primary - [facility] , has not ordered one in the past. She can't remember when her last one was- She would prefer [facility].   Thanks Olam ----- Message ----- From: Cornish Wanda VEAR, MD Sent: 11/19/2023   7:00 PM EDT To: Olam RAMAN Mal, RN  Okay, but multiple questions:   Who usually orders her mammograms and where and when was her last one? I need a copy of report Does she want done at [facility] or [place] [facility]) - I know transportation is an issue.  We can't do diagnostic mammo here. Order is in for [facility], forward this to the schedulers if she agrees.  Let her know they are backed up. ----- Message ----- From: Mal Olam RAMAN, RN Sent: 11/19/2023   2:28 PM EDT To: Wanda VEAR Cornish, MD  Ms Wehrman is reporting a swelling sensation to her left breast- She is requesting an outpatient mammogram- thanks Olam

## 2023-11-23 LAB — HM MAMMOGRAPHY

## 2023-12-02 ENCOUNTER — Encounter: Payer: Self-pay | Admitting: Oncology

## 2023-12-17 ENCOUNTER — Inpatient Hospital Stay: Attending: Hematology and Oncology

## 2023-12-17 VITALS — BP 143/85 | HR 79 | Temp 98.2°F | Resp 18

## 2023-12-17 DIAGNOSIS — C7A012 Malignant carcinoid tumor of the ileum: Secondary | ICD-10-CM | POA: Diagnosis present

## 2023-12-17 MED ORDER — OCTREOTIDE ACETATE 20 MG IM KIT
20.0000 mg | PACK | Freq: Once | INTRAMUSCULAR | Status: AC
  Administered 2023-12-17: 20 mg via INTRAMUSCULAR
  Filled 2023-12-17: qty 1

## 2023-12-17 NOTE — Patient Instructions (Signed)
 Octreotide Injection Solution What is this medication? OCTREOTIDE (ok TREE oh tide) treats high levels of growth hormone (acromegaly). It works by reducing the amount of growth hormone your body makes. This reduces symptoms and the risk of health problems caused by too much growth hormone, such as diabetes and heart disease. It may also be used to treat diarrhea caused by neuroendocrine tumors. It works by slowing down the release of serotonin from the tumor cells. This reduces the number of bowel movements you have. This medicine may be used for other purposes; ask your health care provider or pharmacist if you have questions. COMMON BRAND NAME(S): Berline Lopes, Sandostatin What should I tell my care team before I take this medication? They need to know if you have any of these conditions: Diabetes Gallbladder disease Heart disease Kidney disease Liver disease Pancreatic disease Thyroid disease An unusual or allergic reaction to octreotide, other medications, foods, dyes, or preservatives Pregnant or trying to get pregnant Breastfeeding How should I use this medication? This medication is injected under the skin or into a vein. It is usually given by your care team in a hospital or clinic setting. If you get this medication at home, you will be taught how to prepare and give it. Use exactly as directed. Take it as directed on the prescription label at the same time every day. Keep taking it unless your care team tells you to stop. Allow the injection solution to come to room temperature before use. Do not warm it artificially. It is important that you put your used needles and syringes in a special sharps container. Do not put them in a trash can. If you do not have a sharps container, call your pharmacist or care team to get one. Talk to your care team about the use of this medication in children. Special care may be needed. Overdosage: If you think you have taken too much of this medicine  contact a poison control center or emergency room at once. NOTE: This medicine is only for you. Do not share this medicine with others. What if I miss a dose? If you miss a dose, take it as soon as you can. If it is almost time for your next dose, take only that dose. Do not take double or extra doses. What may interact with this medication? Bromocriptine Certain medications for blood pressure, heart disease, irregular heartbeat Cyclosporine Diuretics Medications for diabetes, including insulin Quinidine This list may not describe all possible interactions. Give your health care provider a list of all the medicines, herbs, non-prescription drugs, or dietary supplements you use. Also tell them if you smoke, drink alcohol, or use illegal drugs. Some items may interact with your medicine. What should I watch for while using this medication? Visit your care team for regular checks on your progress. Tell your care team if your symptoms do not start to get better or if they get worse. To help reduce irritation at the injection site, use a different site for each injection and make sure the solution is at room temperature before use. This medication may cause decreases in blood sugar. Signs of low blood sugar include chills, cool, pale skin or cold sweats, drowsiness, extreme hunger, fast heartbeat, headache, nausea, nervousness or anxiety, shakiness, trembling, unsteadiness, tiredness, or weakness. Contact your care team right away if you experience any of these symptoms. This medication may increase blood sugar. The risk may be higher in patients who already have diabetes. Ask your care team what you can  do to lower your risk of diabetes while taking this medication. You should make sure you get enough vitamin B12 while you are taking this medication. Discuss the foods you eat and the vitamins you take with your care team. What side effects may I notice from receiving this medication? Side effects that  you should report to your care team as soon as possible: Allergic reactions--skin rash, itching, hives, swelling of the face, lips, tongue, or throat Gallbladder problems--severe stomach pain, nausea, vomiting, fever Heart rhythm changes--fast or irregular heartbeat, dizziness, feeling faint or lightheaded, chest pain, trouble breathing High blood sugar (hyperglycemia)--increased thirst or amount of urine, unusual weakness or fatigue, blurry vision Low blood sugar (hypoglycemia)--tremors or shaking, anxiety, sweating, cold or clammy skin, confusion, dizziness, rapid heartbeat Low thyroid levels (hypothyroidism)--unusual weakness or fatigue, increased sensitivity to cold, constipation, hair loss, dry skin, weight gain, feelings of depression Low vitamin B12 level--pain, tingling, or numbness in the hands or feet, muscle weakness, dizziness, confusion, trouble concentrating Oily or light-colored stools, diarrhea, bloating, weight loss Pancreatitis--severe stomach pain that spreads to your back or gets worse after eating or when touched, fever, nausea, vomiting Slow heartbeat--dizziness, feeling faint or lightheaded, confusion, trouble breathing, unusual weakness or fatigue Side effects that usually do not require medical attention (report these to your care team if they continue or are bothersome): Diarrhea Dizziness Headache Nausea Pain, redness, or irritation at injection site Stomach pain This list may not describe all possible side effects. Call your doctor for medical advice about side effects. You may report side effects to FDA at 1-800-FDA-1088. Where should I keep my medication? Keep out of the reach of children and pets. Store in the refrigerator. Protect from light. Allow to come to room temperature naturally. Do not use artificial heat. If protected from light, the injection may be stored between 20 and 30 degrees C (70 and 86 degrees F) for 14 days. After the initial use, throw away  any unused portion of a multiple dose vial after 14 days. Get rid of any unused portions of the ampules after use. To get rid of medications that are no longer needed or have expired: Take the medication to a medication take-back program. Ask your pharmacy or law enforcement to find a location. If you cannot return the medication, ask your pharmacist or care team how to get rid of the medication safely. NOTE: This sheet is a summary. It may not cover all possible information. If you have questions about this medicine, talk to your doctor, pharmacist, or health care provider.  2024 [employer]/Gold Standard (2023-03-27 00:00:00)

## 2024-01-14 ENCOUNTER — Inpatient Hospital Stay

## 2024-01-14 ENCOUNTER — Telehealth: Payer: Self-pay | Admitting: Oncology

## 2024-01-14 ENCOUNTER — Other Ambulatory Visit: Payer: Self-pay | Admitting: Oncology

## 2024-01-14 ENCOUNTER — Inpatient Hospital Stay: Attending: Hematology and Oncology

## 2024-01-14 ENCOUNTER — Encounter: Payer: Self-pay | Admitting: Oncology

## 2024-01-14 ENCOUNTER — Inpatient Hospital Stay (HOSPITAL_BASED_OUTPATIENT_CLINIC_OR_DEPARTMENT_OTHER): Admitting: Oncology

## 2024-01-14 VITALS — BP 125/85 | HR 67 | Resp 18 | Ht 64.0 in | Wt 183.5 lb

## 2024-01-14 DIAGNOSIS — M5410 Radiculopathy, site unspecified: Secondary | ICD-10-CM | POA: Insufficient documentation

## 2024-01-14 DIAGNOSIS — F1290 Cannabis use, unspecified, uncomplicated: Secondary | ICD-10-CM | POA: Diagnosis not present

## 2024-01-14 DIAGNOSIS — Z801 Family history of malignant neoplasm of trachea, bronchus and lung: Secondary | ICD-10-CM | POA: Diagnosis not present

## 2024-01-14 DIAGNOSIS — M479 Spondylosis, unspecified: Secondary | ICD-10-CM | POA: Diagnosis not present

## 2024-01-14 DIAGNOSIS — E3400 Carcinoid syndrome, unspecified: Secondary | ICD-10-CM | POA: Insufficient documentation

## 2024-01-14 DIAGNOSIS — C7B01 Secondary carcinoid tumors of distant lymph nodes: Secondary | ICD-10-CM | POA: Insufficient documentation

## 2024-01-14 DIAGNOSIS — C7A012 Malignant carcinoid tumor of the ileum: Secondary | ICD-10-CM | POA: Diagnosis not present

## 2024-01-14 DIAGNOSIS — R978 Other abnormal tumor markers: Secondary | ICD-10-CM | POA: Insufficient documentation

## 2024-01-14 DIAGNOSIS — Z87891 Personal history of nicotine dependence: Secondary | ICD-10-CM | POA: Diagnosis not present

## 2024-01-14 DIAGNOSIS — Z809 Family history of malignant neoplasm, unspecified: Secondary | ICD-10-CM | POA: Diagnosis not present

## 2024-01-14 DIAGNOSIS — D3500 Benign neoplasm of unspecified adrenal gland: Secondary | ICD-10-CM

## 2024-01-14 DIAGNOSIS — Z7901 Long term (current) use of anticoagulants: Secondary | ICD-10-CM | POA: Insufficient documentation

## 2024-01-14 DIAGNOSIS — Z86711 Personal history of pulmonary embolism: Secondary | ICD-10-CM | POA: Diagnosis not present

## 2024-01-14 DIAGNOSIS — E876 Hypokalemia: Secondary | ICD-10-CM | POA: Insufficient documentation

## 2024-01-14 LAB — CMP (CANCER CENTER ONLY)
ALT: 5 U/L (ref 0–44)
AST: 16 U/L (ref 15–41)
Albumin: 3.9 g/dL (ref 3.5–5.0)
Alkaline Phosphatase: 124 U/L (ref 38–126)
Anion gap: 11 (ref 5–15)
BUN: 8 mg/dL (ref 8–23)
CO2: 27 mmol/L (ref 22–32)
Calcium: 9.5 mg/dL (ref 8.9–10.3)
Chloride: 101 mmol/L (ref 98–111)
Creatinine: 0.75 mg/dL (ref 0.44–1.00)
GFR, Estimated: >60 mL/min (ref >60)
Glucose, Bld: 95 mg/dL (ref 70–99)
Potassium: 3.2 mmol/L — ABNORMAL LOW (ref 3.5–5.1)
Sodium: 139 mmol/L (ref 135–145)
Total Bilirubin: 0.3 mg/dL (ref 0.0–1.2)
Total Protein: 8 g/dL (ref 6.5–8.1)

## 2024-01-14 LAB — CBC WITH DIFFERENTIAL (CANCER CENTER ONLY)
Abs Immature Granulocytes: 0.03 K/uL (ref 0.00–0.07)
Basophils Absolute: 0 K/uL (ref 0.0–0.1)
Basophils Relative: 0 %
Eosinophils Absolute: 0.1 K/uL (ref 0.0–0.5)
Eosinophils Relative: 1 %
HCT: 39.3 % (ref 36.0–46.0)
Hemoglobin: 12.7 g/dL (ref 12.0–15.0)
Immature Granulocytes: 0 %
Lymphocytes Relative: 49 %
Lymphs Abs: 5 K/uL — ABNORMAL HIGH (ref 0.7–4.0)
MCH: 31 pg (ref 26.0–34.0)
MCHC: 32.3 g/dL (ref 30.0–36.0)
MCV: 95.9 fL (ref 80.0–100.0)
Monocytes Absolute: 0.7 K/uL (ref 0.1–1.0)
Monocytes Relative: 7 %
Neutro Abs: 4.5 K/uL (ref 1.7–7.7)
Neutrophils Relative %: 43 %
Platelet Count: 307 K/uL (ref 150–400)
RBC: 4.1 MIL/uL (ref 3.87–5.11)
RDW: 13.6 % (ref 11.5–15.5)
WBC Count: 10.4 K/uL (ref 4.0–10.5)
nRBC: 0 % (ref 0.0–0.2)

## 2024-01-14 MED ORDER — POTASSIUM CHLORIDE CRYS ER 20 MEQ PO TBCR: 20.0000 meq | EXTENDED_RELEASE_TABLET | Freq: Two times a day (BID) | ORAL | 5 refills | Status: DC

## 2024-01-14 MED ORDER — OCTREOTIDE ACETATE 20 MG IM KIT
20.0000 mg | PACK | Freq: Once | INTRAMUSCULAR | Status: AC
  Administered 2024-01-14: 20 mg via INTRAMUSCULAR
  Filled 2024-01-14: qty 1

## 2024-01-14 NOTE — Telephone Encounter (Signed)
 Patient has been scheduled for follow-up visit per 01/14/24 LOS.  Pt given an appt calendar with date and time.

## 2024-01-14 NOTE — Progress Notes (Signed)
 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 Seven of eighteen lymph nodes were involved (7/18).  Primary tumor measured 2.2 x 1.7 x 1.5 cm, with three apparent vascular metastases including a nodule up to 3 cm with metastatic tumor present within 1 mm of the inked radial margin.  In the distal appendix there was also a separate 3 mm carcinoid tumor.  This was a staged as a pT3pN1 with low mitotic rate (0 per 10 high powered fields).  [name] was sporadic.  We began seeing her in February 2022 for carcinoid syndrome symptoms.  Her chromogranin A was elevated at 262.  CT chest, abdomen and pelvis did not reveal any evidence of recurrent or metastatic disease.  She was started on octreotide  every 4 weeks.  She had normalization of her chromogranin A and this has remained normal.  Routine CT imaging has remained without evidence of recurrent or metastatic disease, most recently in May 2024.  She is not always compliant with her octreotide  injections.  She missed her injection in February as she was in the ED at [facility] for chest pain.  Evaluation did not reveal cardiac abnormality.  She will continue octreotide  injections every 4 weeks. Her CT chest, abdomen, pelvis completed on 10/29/2023 revealed status post subtotal right hemicolectomy with ileocolic anastomosis with no evidence of local recurrence or metastatic disease in the chest, abdomen or pelvis, a stable 2.3 cm left adrenal nodule unchanged, an enlarged main pulmonary artery, and aortic atherosclerosis.   Neuropathy She saw Dr. Jama, neurology, [facility] in October 2023.  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     Pulmonary embolism with acute respiratory failure with hypoxia  She was found to have right lower lobe subsegmental nonocclusive pulmonary embolism incidentally in May, for which she was placed on Eliquis  twice daily.  Dr. Mardee recommended continuing Eliquis  for at least 6 months. We had thought this was discontinued, but she continues on Eliquis  5 mg daily per her PCP.   Hypokalemia Her potassium is down to 3.2 despite supposedly taking 10 mEq BID. I instructed her to increase this to 20 mEq BID, but I question her compliance.  DJD/DDD She has extensive osteoarthritis and degenerative disc disease with significant radiculopathy, but there is not much that can be done. I have explained this to her once again as she is very frustrated that she cannot ambulate.    Plan: Patient states that she feels poorly, and complains of widespread pain, nausea, worsened gait, and decreased appetite without weight change. I recommend that we consider PT for her declining gait stability and will discuss this with Dr. Zachary Lamar. She visited [place] ED on 12/08/2023 for leg pain and numbness. 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 We know past scans of her spine reveal extensive osteoarthritis and degenerative disc disease. She had a diagnostic mammogram performed on 11/23/2023 which was negative. Her CT chest, abdomen, pelvis completed on 10/29/2023 revealed status post subtotal right hemicolectomy with ileocolic anastomosis with no evidence  of local recurrence or metastatic disease in the chest, abdomen or pelvis, a stable 2.3 cm left adrenal nodule unchanged, an enlarged main pulmonary artery, and aortic atherosclerosis. She has a WBC of 10.4, hemoglobin of 12.7, and platelet count of  307,000. Her CMP is normal other than a low potassium of 3.2 down from 3.9. Her chromogranin A is pending today, but has been in the normal range. I instructed her to increase her potassium chloride  from 10 mEq BID to 20 mEq BID. She will continue her Sandostatin  every 4 weeks. I will see her back in 12 weeks with CBC, CMP, and chromogranin A. The patient understands the plans discussed today and is in agreement with them.  She knows to contact our office if she develops concerns prior to her next appointment.  I provided 15 minutes of face-to-face time during this encounter and > 50% was spent counseling as documented under my assessment and plan.   Kristi Romero Cornish, MD  [facility] A DEPT OF [facility] [address] [place] [address] Dept: [phone] Dept Fax: [phone]   No orders of the defined types were placed in this encounter.   CHIEF COMPLAINT:  CC: Malignant carcinoid  Current Treatment: Octreotide  every 4 weeks  HISTORY OF PRESENT ILLNESS:   Oncology History  Malignant carcinoid tumor of ileum (HCC)  04/28/2014 Initial Diagnosis   Malignant carcinoid tumor of ileum (HCC)   02/02/2015 Cancer Staging   Staging form: Neuroendocrine Tumor - Duodenum/Ampulla/Jejunum/Illeum, AJCC 7th Edition - Clinical stage from 02/02/2015: Stage IIIB (T3(3), N1, M0) - Signed by Kristi Kristi VEAR, MD on 05/28/2020 Staged by: Managing physician Diagnostic confirmation: Positive histology Specimen type: Excision Histopathologic type: Carcinoid tumor, NOS Stage prefix: Initial diagnosis Tumor size (mm): 22 Multiple tumors: Yes Number of tumors: 3 Histologic grade (G): G1 Lymph-vascular invasion (LVI): LVI not present (absent)/not identified Residual tumor (R): R0 - None Stage used in treatment planning: Yes National guidelines used in treatment planning: Yes Type of national guideline used in treatment planning:  NCCN Staging comments: Surgical resection     INTERVAL HISTORY:  Kristi Kristi is here today for repeat clinical assessment for her malignant carcinoid of the ileum. Patient states that she feels poorly, and complains of widespread pain, nausea, worsened gait, and decreased appetite without weight change. I recommend that we consider PT for her declining gait stability and will discuss this with Dr. Zachary Charleston. She visited [place] ED on 12/08/2023 for leg pain and numbness. 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 We know past scans of her spine reveal extensive osteoarthritis and degenerative disc disease. She had a diagnostic mammogram performed on 11/23/2023 which was negative. Her CT chest, abdomen, pelvis completed on 10/29/2023 revealed status post subtotal right hemicolectomy with ileocolic anastomosis with no evidence of local recurrence or metastatic disease in the chest, abdomen or pelvis, a stable 2.3 cm left adrenal nodule unchanged, an enlarged main pulmonary artery, and aortic atherosclerosis. She has a WBC of 10.4, hemoglobin of 12.7, and platelet count of 307,000. Her CMP is normal other than a low potassium of 3.2 down from 3.9. Her chromogranin A is pending today, but has been in the normal range. I instructed her to increase her potassium chloride  from 10 mEq BID to 20 mEq BID. She will continue her Sandostatin  every 4 weeks. I will see her back in 12 weeks with  CBC, CMP, and chromogranin A.  She denies fever, chills, night sweats, or other signs of infection. She denies cardiorespiratory and gastrointestinal issues. Her appetite is poor and Her weight has been stable at 183 pounds.  REVIEW OF SYSTEMS:  Review of Systems  Constitutional:  Positive for appetite change (decreased). Negative for chills, fatigue, fever and unexpected weight change.  HENT:  Negative.  Negative for lump/mass, mouth sores and sore throat.    Eyes: Negative.   Respiratory: Negative.  Negative for chest tightness, cough, hemoptysis, shortness of breath and wheezing.   Cardiovascular: Negative.  Negative for chest pain, leg swelling and palpitations.  Gastrointestinal:  Positive for abdominal pain and nausea. Negative for abdominal distention, blood in stool, constipation, diarrhea and vomiting.  Endocrine: Negative.  Negative for hot flashes.  Genitourinary: Negative.  Negative for difficulty urinating, dysuria, frequency and hematuria.   Musculoskeletal:  Positive for arthralgias, back pain (8/10) and gait problem (in a wheelchair due to neuropathy). Negative for flank pain, myalgias and neck pain.  Skin: Negative.  Negative for rash.  Neurological:  Positive for gait problem (in a wheelchair due to neuropathy) and numbness (hands and legs; right lower extremity numbness and burning from neuropathy). Negative for dizziness, extremity weakness, headaches, light-headedness, seizures and speech difficulty.  Hematological: Negative.  Negative for adenopathy. Does not bruise/bleed easily.  Psychiatric/Behavioral: Negative.  Negative for depression and sleep disturbance. The patient is not nervous/anxious.      VITALS:  Blood pressure 125/85, pulse 67, resp. rate 18, height 5' 4 (1.626 m), weight 183 lb 8 oz (83.2 kg), SpO2 94%.  Wt Readings from Last 3 Encounters:  01/14/24 183 lb 8 oz (83.2 kg)  10/22/23 183 lb 4.8 oz (83.1 kg)  07/30/23 192 lb (87.1 kg)    Body mass index is 31.5 kg/m.  Performance status (ECOG): 2 - Symptomatic, <50% confined to bed  PHYSICAL EXAM:  Physical Exam Vitals and nursing note reviewed.  Constitutional:      General: She is not in acute distress.    Appearance: Normal appearance.  HENT:     Head: Normocephalic and atraumatic.     Mouth/Throat:     Mouth: Mucous membranes are moist.     Pharynx: Oropharynx is clear. No oropharyngeal exudate or posterior oropharyngeal erythema.  Eyes:      General: No scleral icterus.    Extraocular Movements: Extraocular movements intact.     Conjunctiva/sclera: Conjunctivae normal.     Pupils: Pupils are equal, round, and reactive to light.  Cardiovascular:     Rate and Rhythm: Normal rate and regular rhythm.     Heart sounds: Normal heart sounds. No murmur heard.    No friction rub. No gallop.  Pulmonary:     Effort: Pulmonary effort is normal.     Breath sounds: Normal breath sounds. No wheezing, rhonchi or rales.  Abdominal:     General: There is no distension.     Palpations: Abdomen is soft. There is no hepatomegaly, splenomegaly or mass.     Tenderness: There is no abdominal tenderness.  Musculoskeletal:        General: Normal range of motion.     Cervical back: Normal range of motion and neck supple. No tenderness.     Right lower leg: No edema.     Left lower leg: No edema.  Lymphadenopathy:     Cervical: No cervical adenopathy.     Upper Body:     Right upper body: No supraclavicular or  axillary adenopathy.     Left upper body: No supraclavicular or axillary adenopathy.     Lower Body: No right inguinal adenopathy. No left inguinal adenopathy.  Skin:    General: Skin is warm and dry.     Coloration: Skin is not jaundiced.     Findings: No rash.  Neurological:     Mental Status: She is alert and oriented to person, place, and time.     Cranial Nerves: No cranial nerve deficit.  Psychiatric:        Mood and Affect: Mood normal.        Behavior: Behavior normal.        Thought Content: Thought content normal.     LABS:      Latest Ref Rng & Units 01/14/2024    1:26 PM 10/22/2023    1:50 PM 07/30/2023    1:03 PM  CBC  WBC 4.0 - 10.5 K/uL 10.4  11.7  11.2   Hemoglobin 12.0 - 15.0 g/dL 87.2  86.5  87.3   Hematocrit 36.0 - 46.0 % 39.3  41.7  38.2   Platelets 150 - 400 K/uL 307  239  253       Latest Ref Rng & Units 01/14/2024    1:26 PM 10/22/2023    1:50 PM 07/30/2023    1:03 PM  CMP  Glucose 70 - 99 mg/dL 95  78   83   BUN 8 - 23 mg/dL 8  6  10    Creatinine 0.44 - 1.00 mg/dL 9.24  9.20  9.33   Sodium 135 - 145 mmol/L 139  140  139   Potassium 3.5 - 5.1 mmol/L 3.2  3.9  3.8   Chloride 98 - 111 mmol/L 101  103  102   CO2 22 - 32 mmol/L 27  26  28    Calcium 8.9 - 10.3 mg/dL 9.5  9.7  9.5   Total Protein 6.5 - 8.1 g/dL 8.0  7.8  7.4   Total Bilirubin 0.0 - 1.2 mg/dL 0.3  0.4  0.2   Alkaline Phos 38 - 126 U/L 124  140  119   AST 15 - 41 U/L 16  13  16    ALT 0 - 44 U/L 5  <5  9     Lab Results  Component Value Date   TOTALPROTELP 6.6 06/01/2020   ALBUMINELP 3.1 06/01/2020   A1GS 0.3 06/01/2020   A2GS 0.8 06/01/2020   BETS 1.1 06/01/2020   GAMS 1.4 06/01/2020   MSPIKE Not Observed 06/01/2020   SPEI Comment 06/01/2020   Lab Results  Component Value Date   TIBC 244 (L) 09/06/2021   FERRITIN 873 (H) 09/06/2021   IRONPCTSAT 9 (L) 09/06/2021    STUDIES:  EXAM: 11/23/2023 MAM DIGITAL W/TOMO DIAG L IMPRESSION: Overall final assessnent: BIRADS 1 NEGATIVE.  EXAM: 10/29/2023 CT CHEST, ABDOMEN, AND PELVIS WITH CONTRAST IMPRESSION: 1. Status post subtotal right hemicolectomy with ileocolic anastomosis. No evidence of local recurrence or metastatic disease in the chest, abdomen or pelvis. 2. Stable 2.3 cm left adrenal nodule unchanged over multiple prior examinations and favored a lipid poor adenoma. 3. Enlarged main pulmonary artery, which can be seen in the setting of pulmonary arterial hypertension. 4.  Aortic Atherosclerosis (ICD10-I70.0).  HISTORY:   Past Medical History:  Diagnosis Date   Abdominal pain, epigastric    Accident occurring in home 05/07/2016   Acute respiratory failure with hypoxia (HCC) 09/06/2021   Adrenal adenoma 03/16/2018   Anxiety  and depression 03/16/2018   BMI 26.0-26.9,adult    Cervicogenic headache 10/14/2016   Chest pain, unspecified    Chronic abdominal pain and nausea  09/06/2021   Chronic back pain    Constipation 04/08/2021   Dizziness  10/14/2016   Elevated lipase 01/13/2017   Formatting of this note might be different from the original. 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   Normocytic anemia 09/06/2021   Numbness    Numbness and tingling 10/14/2016   Pulmonary embolism with acute respiratory failure with hypoxia  09/06/2021   Restless leg syndrome    Sleeping difficulty 04/18/2016   Spondylosis, cervical, 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 Dr Mckinley [place]    COLONOSCOPY  09/05/2020   Dr Anette Benign neoplasm of transverse colon. Benign neoplasm of descending colon.   ESOPHAGOGASTRODUODENOSCOPY  02/2018   ESOPHAGOGASTRODUODENOSCOPY (EGD) WITH PROPOFOL  N/A 03/16/2018   Procedure: ESOPHAGOGASTRODUODENOSCOPY (EGD) WITH PROPOFOL ;  Surgeon: Aneita Gwendlyn DASEN, MD;  Location: [facility] ENDOSCOPY;  Service: Endoscopy;  Laterality: N/A;   NECK SURGERY     PARTIAL HYSTERECTOMY     TOE SURGERY Bilateral     Family History  Problem Relation Age of Onset   Lung cancer Father    Heart disease Father    Hypertension  Father    Heart disease Mother    Hypertension Mother    Cancer Mother     Social History:  reports that she has quit smoking. She has never used smokeless tobacco. She reports current drug use. Drug: Marijuana. She reports that she does not drink alcohol.The patient is accompanied by her home health aide today.  Allergies:  Allergies  Allergen Reactions   Atorvastatin Anaphylaxis   Codeine Nausea And Vomiting, Nausea Only and Shortness Of Breath    Other reaction(s): GI Upset (intolerance)  Other Reaction(s): upset stomach  Other reaction(s): GI Upset (intolerance)  Other Reaction(s): upset stomach   Lisinopril Anaphylaxis, Swelling and Other (See Comments)    Other Reaction(s): tongue swells up    Current Medications: Current Outpatient Medications  Medication Sig Dispense Refill   acetaminophen  (TYLENOL ) 500 MG tablet Take 500 mg by mouth every 6 (six) hours as needed for mild pain.     apixaban  (ELIQUIS ) 5 MG TABS tablet Take 1 tablet (5 mg total) by mouth 2 (two) times daily. (Patient taking differently: Take 5 mg by mouth 2 (two) times daily. Patient voiced taking once daily) 60 tablet 0   atorvastatin (LIPITOR) 40 MG tablet Take 40 mg by mouth daily.     baclofen (LIORESAL) 10  MG tablet Take 10 mg by mouth 2 (two) times daily.     diclofenac Sodium (VOLTAREN) 1 % GEL Apply topically.     dicyclomine  (BENTYL ) 20 MG tablet Take 20 mg by mouth 4 (four) times daily as needed for spasms.     DULoxetine  (CYMBALTA ) 60 MG capsule Take 60 mg by mouth daily.     Fluticasone-Umeclidin-Vilant (TRELEGY ELLIPTA) 100-62.5-25 MCG/ACT AEPB 1 puff Inhalation Once a day for 90 days     isosorbide mononitrate (IMDUR) 30 MG 24 hr tablet 1 tablet in the morning Orally Once a day     LINZESS  290 MCG CAPS capsule Take 290 mcg by mouth daily. Patient reports taking as needed     meclizine  (ANTIVERT ) 25 MG tablet Take 25 mg by mouth 4 (four) times daily as needed for dizziness.     metoprolol   succinate (TOPROL -XL) 25 MG 24 hr tablet 1 tablet Orally Once a day     mirtazapine  (REMERON ) 15 MG tablet TAKE 1 TABLET AT BEDTIME 90 tablet 3   nitroGLYCERIN (NITROSTAT) 0.4 MG SL tablet 1 tablet under the tongue and allow to dissolve as needed. Take every 5 minutes up to 3 times if chest pain persists Sublingual Three times a day     omeprazole  (PRILOSEC) 40 MG capsule Take 1 capsule (40 mg total) by mouth daily. 30 capsule 1   ondansetron  (ZOFRAN ) 4 MG tablet Take 1 tablet (4 mg total) by mouth every 6 (six) hours. 12 tablet 0   ondansetron  (ZOFRAN -ODT) 4 MG disintegrating tablet Take 4 mg by mouth 2 (two) times daily as needed for nausea or vomiting.     potassium chloride  SA (KLOR-CON  M) 20 MEQ tablet Take 1 tablet (20 mEq total) by mouth 2 (two) times daily. 60 tablet 5   predniSONE (DELTASONE) 20 MG tablet Take 20 mg by mouth daily.     pregabalin (LYRICA) 50 MG capsule Take by mouth.     traMADol (ULTRAM) 50 MG tablet Take 50 mg by mouth 2 (two) times daily as needed.     No current facility-administered medications for this visit.   I,Lesean Woolverton H Martavious Hartel,acting as a scribe for Kristi Romero Cornish, MD.,have documented all relevant documentation on the behalf of Kristi Romero Cornish, MD,as directed by  Kristi Romero Cornish, MD while in the presence of Kristi Romero Cornish, MD.

## 2024-01-14 NOTE — Patient Instructions (Signed)
 Octreotide Injection Solution What is this medication? OCTREOTIDE (ok TREE oh tide) treats high levels of growth hormone (acromegaly). It works by reducing the amount of growth hormone your body makes. This reduces symptoms and the risk of health problems caused by too much growth hormone, such as diabetes and heart disease. It may also be used to treat diarrhea caused by neuroendocrine tumors. It works by slowing down the release of serotonin from the tumor cells. This reduces the number of bowel movements you have. This medicine may be used for other purposes; ask your health care provider or pharmacist if you have questions. COMMON BRAND NAME(S): Berline Lopes, Sandostatin What should I tell my care team before I take this medication? They need to know if you have any of these conditions: Diabetes Gallbladder disease Heart disease Kidney disease Liver disease Pancreatic disease Thyroid disease An unusual or allergic reaction to octreotide, other medications, foods, dyes, or preservatives Pregnant or trying to get pregnant Breastfeeding How should I use this medication? This medication is injected under the skin or into a vein. It is usually given by your care team in a hospital or clinic setting. If you get this medication at home, you will be taught how to prepare and give it. Use exactly as directed. Take it as directed on the prescription label at the same time every day. Keep taking it unless your care team tells you to stop. Allow the injection solution to come to room temperature before use. Do not warm it artificially. It is important that you put your used needles and syringes in a special sharps container. Do not put them in a trash can. If you do not have a sharps container, call your pharmacist or care team to get one. Talk to your care team about the use of this medication in children. Special care may be needed. Overdosage: If you think you have taken too much of this medicine  contact a poison control center or emergency room at once. NOTE: This medicine is only for you. Do not share this medicine with others. What if I miss a dose? If you miss a dose, take it as soon as you can. If it is almost time for your next dose, take only that dose. Do not take double or extra doses. What may interact with this medication? Bromocriptine Certain medications for blood pressure, heart disease, irregular heartbeat Cyclosporine Diuretics Medications for diabetes, including insulin Quinidine This list may not describe all possible interactions. Give your health care provider a list of all the medicines, herbs, non-prescription drugs, or dietary supplements you use. Also tell them if you smoke, drink alcohol, or use illegal drugs. Some items may interact with your medicine. What should I watch for while using this medication? Visit your care team for regular checks on your progress. Tell your care team if your symptoms do not start to get better or if they get worse. To help reduce irritation at the injection site, use a different site for each injection and make sure the solution is at room temperature before use. This medication may cause decreases in blood sugar. Signs of low blood sugar include chills, cool, pale skin or cold sweats, drowsiness, extreme hunger, fast heartbeat, headache, nausea, nervousness or anxiety, shakiness, trembling, unsteadiness, tiredness, or weakness. Contact your care team right away if you experience any of these symptoms. This medication may increase blood sugar. The risk may be higher in patients who already have diabetes. Ask your care team what you can  do to lower your risk of diabetes while taking this medication. You should make sure you get enough vitamin B12 while you are taking this medication. Discuss the foods you eat and the vitamins you take with your care team. What side effects may I notice from receiving this medication? Side effects that  you should report to your care team as soon as possible: Allergic reactions--skin rash, itching, hives, swelling of the face, lips, tongue, or throat Gallbladder problems--severe stomach pain, nausea, vomiting, fever Heart rhythm changes--fast or irregular heartbeat, dizziness, feeling faint or lightheaded, chest pain, trouble breathing High blood sugar (hyperglycemia)--increased thirst or amount of urine, unusual weakness or fatigue, blurry vision Low blood sugar (hypoglycemia)--tremors or shaking, anxiety, sweating, cold or clammy skin, confusion, dizziness, rapid heartbeat Low thyroid levels (hypothyroidism)--unusual weakness or fatigue, increased sensitivity to cold, constipation, hair loss, dry skin, weight gain, feelings of depression Low vitamin B12 level--pain, tingling, or numbness in the hands or feet, muscle weakness, dizziness, confusion, trouble concentrating Oily or light-colored stools, diarrhea, bloating, weight loss Pancreatitis--severe stomach pain that spreads to your back or gets worse after eating or when touched, fever, nausea, vomiting Slow heartbeat--dizziness, feeling faint or lightheaded, confusion, trouble breathing, unusual weakness or fatigue Side effects that usually do not require medical attention (report these to your care team if they continue or are bothersome): Diarrhea Dizziness Headache Nausea Pain, redness, or irritation at injection site Stomach pain This list may not describe all possible side effects. Call your doctor for medical advice about side effects. You may report side effects to FDA at 1-800-FDA-1088. Where should I keep my medication? Keep out of the reach of children and pets. Store in the refrigerator. Protect from light. Allow to come to room temperature naturally. Do not use artificial heat. If protected from light, the injection may be stored between 20 and 30 degrees C (70 and 86 degrees F) for 14 days. After the initial use, throw away  any unused portion of a multiple dose vial after 14 days. Get rid of any unused portions of the ampules after use. To get rid of medications that are no longer needed or have expired: Take the medication to a medication take-back program. Ask your pharmacy or law enforcement to find a location. If you cannot return the medication, ask your pharmacist or care team how to get rid of the medication safely. NOTE: This sheet is a summary. It may not cover all possible information. If you have questions about this medicine, talk to your doctor, pharmacist, or health care provider.  2024 [employer]/Gold Standard (2023-03-27 00:00:00)

## 2024-01-16 LAB — CHROMOGRANIN A: Chromogranin A (ng/mL): 35.6 ng/mL (ref 0.0–101.8)

## 2024-01-25 ENCOUNTER — Encounter: Payer: Self-pay | Admitting: Oncology

## 2024-02-11 ENCOUNTER — Inpatient Hospital Stay: Attending: Hematology and Oncology

## 2024-02-11 VITALS — BP 132/89 | HR 75 | Temp 98.0°F | Resp 18 | Ht 64.0 in

## 2024-02-11 DIAGNOSIS — Z79899 Other long term (current) drug therapy: Secondary | ICD-10-CM | POA: Insufficient documentation

## 2024-02-11 DIAGNOSIS — C7A012 Malignant carcinoid tumor of the ileum: Secondary | ICD-10-CM | POA: Insufficient documentation

## 2024-02-11 MED ORDER — OCTREOTIDE ACETATE 20 MG IM KIT
20.0000 mg | PACK | Freq: Once | INTRAMUSCULAR | Status: AC
  Administered 2024-02-11: 20 mg via INTRAMUSCULAR
  Filled 2024-02-11: qty 1

## 2024-02-11 NOTE — Patient Instructions (Signed)
 Octreotide Injection Suspension What is this medication? OCTREOTIDE (ok TREE oh tide) treats high levels of growth hormone (acromegaly). It works by reducing the amount of growth hormone your body makes. This reduces symptoms and the risk of health problems caused by too much growth hormone, such as diabetes and heart disease. It may also be used to treat diarrhea caused by neuroendocrine tumors. It works by slowing down the release of serotonin from the tumor cells. This reduces the number of bowel movements you have. This medicine may be used for other purposes; ask your health care provider or pharmacist if you have questions. COMMON BRAND NAME(S): Sandostatin LAR What should I tell my care team before I take this medication? They need to know if you have any of these conditions: Diabetes Gallbladder disease Heart disease Kidney disease Liver disease Pancreatic disease Thyroid disease An unusual or allergic reaction to octreotide, other medications, foods, dyes, or preservatives Pregnant or trying to get pregnant Breastfeeding How should I use this medication? This medication is injected into a muscle. It is usually given by your care team in a hospital or clinic setting. Talk to your care team about the use of this medication in children. Special care may be needed. Overdosage: If you think you have taken too much of this medicine contact a poison control center or emergency room at once. NOTE: This medicine is only for you. Do not share this medicine with others. What if I miss a dose? Keep appointments for follow-up doses. It is important not to miss your dose. Call your care team if you are unable to keep an appointment. What may interact with this medication? Do not take this medication with any of the following: Cisapride Dronedarone Flibanserin Lutetium Lu 177 dotatate Pimozide Saquinavir Thioridazine This medication may also interact with the  following: Bromocriptine Certain medications for blood pressure, heart disease, irregular heartbeat Cyclosporine Diuretics Medications for diabetes, including insulin Quinidine This list may not describe all possible interactions. Give your health care provider a list of all the medicines, herbs, non-prescription drugs, or dietary supplements you use. Also tell them if you smoke, drink alcohol, or use illegal drugs. Some items may interact with your medicine. What should I watch for while using this medication? Visit your care team for regular checks on your progress. Tell your care team if your symptoms do not start to get better or if they get worse. This medication may cause decreases in blood sugar. Signs of low blood sugar include chills, cool, pale skin or cold sweats, drowsiness, extreme hunger, fast heartbeat, headache, nausea, nervousness or anxiety, shakiness, trembling, unsteadiness, tiredness, or weakness. Contact your care team right away if you experience any of these symptoms. This medication may increase blood sugar. The risk may be higher in patients who already have diabetes. Ask your care team what you can do to lower your risk of diabetes while taking this medication. You should make sure you get enough vitamin B12 while you are taking this medication. Discuss the foods you eat and the vitamins you take with your care team. What side effects may I notice from receiving this medication? Side effects that you should report to your care team as soon as possible: Allergic reactions--skin rash, itching, hives, swelling of the face, lips, tongue, or throat Gallbladder problems--severe stomach pain, nausea, vomiting, fever Heart rhythm changes--fast or irregular heartbeat, dizziness, feeling faint or lightheaded, chest pain, trouble breathing High blood sugar (hyperglycemia)--increased thirst or amount of urine, unusual weakness or fatigue,  blurry vision Low blood sugar  (hypoglycemia)--tremors or shaking, anxiety, sweating, cold or clammy skin, confusion, dizziness, rapid heartbeat Low thyroid levels (hypothyroidism)--unusual weakness or fatigue, increased sensitivity to cold, constipation, hair loss, dry skin, weight gain, feelings of depression Low vitamin B12 level--pain, tingling, or numbness in the hands or feet, muscle weakness, dizziness, confusion, trouble concentrating Oily or light-colored stools, diarrhea, bloating, weight loss Pancreatitis--severe stomach pain that spreads to your back or gets worse after eating or when touched, fever, nausea, vomiting Slow heartbeat--dizziness, feeling faint or lightheaded, confusion, trouble breathing, unusual weakness or fatigue Side effects that usually do not require medical attention (report these to your care team if they continue or are bothersome): Diarrhea Dizziness Headache Nausea Pain, redness, or irritation at injection site Stomach pain This list may not describe all possible side effects. Call your doctor for medical advice about side effects. You may report side effects to FDA at 1-800-FDA-1088. Where should I keep my medication? This medication is given in a hospital or clinic. It will not be stored at home. NOTE: This sheet is a summary. It may not cover all possible information. If you have questions about this medicine, talk to your doctor, pharmacist, or health care provider.  2024 Elsevier/Gold Standard (2023-03-27 00:00:00)

## 2024-03-10 ENCOUNTER — Inpatient Hospital Stay: Attending: Hematology and Oncology

## 2024-03-10 VITALS — BP 122/75 | HR 69 | Temp 98.4°F | Resp 20

## 2024-03-10 DIAGNOSIS — C7A012 Malignant carcinoid tumor of the ileum: Secondary | ICD-10-CM | POA: Diagnosis present

## 2024-03-10 DIAGNOSIS — Z79899 Other long term (current) drug therapy: Secondary | ICD-10-CM | POA: Insufficient documentation

## 2024-03-10 MED ORDER — OCTREOTIDE ACETATE 20 MG IM KIT
20.0000 mg | PACK | Freq: Once | INTRAMUSCULAR | Status: AC
  Administered 2024-03-10: 20 mg via INTRAMUSCULAR
  Filled 2024-03-10: qty 1

## 2024-03-10 NOTE — Patient Instructions (Signed)
 Octreotide Injection Solution What is this medication? OCTREOTIDE (ok TREE oh tide) treats high levels of growth hormone (acromegaly). It works by reducing the amount of growth hormone your body makes. This reduces symptoms and the risk of health problems caused by too much growth hormone, such as diabetes and heart disease. It may also be used to treat diarrhea caused by neuroendocrine tumors. It works by slowing down the release of serotonin from the tumor cells. This reduces the number of bowel movements you have. This medicine may be used for other purposes; ask your health care provider or pharmacist if you have questions. COMMON BRAND NAME(S): Berline Lopes, Sandostatin What should I tell my care team before I take this medication? They need to know if you have any of these conditions: Diabetes Gallbladder disease Heart disease Kidney disease Liver disease Pancreatic disease Thyroid disease An unusual or allergic reaction to octreotide, other medications, foods, dyes, or preservatives Pregnant or trying to get pregnant Breastfeeding How should I use this medication? This medication is injected under the skin or into a vein. It is usually given by your care team in a hospital or clinic setting. If you get this medication at home, you will be taught how to prepare and give it. Use exactly as directed. Take it as directed on the prescription label at the same time every day. Keep taking it unless your care team tells you to stop. Allow the injection solution to come to room temperature before use. Do not warm it artificially. It is important that you put your used needles and syringes in a special sharps container. Do not put them in a trash can. If you do not have a sharps container, call your pharmacist or care team to get one. Talk to your care team about the use of this medication in children. Special care may be needed. Overdosage: If you think you have taken too much of this medicine  contact a poison control center or emergency room at once. NOTE: This medicine is only for you. Do not share this medicine with others. What if I miss a dose? If you miss a dose, take it as soon as you can. If it is almost time for your next dose, take only that dose. Do not take double or extra doses. What may interact with this medication? Bromocriptine Certain medications for blood pressure, heart disease, irregular heartbeat Cyclosporine Diuretics Medications for diabetes, including insulin Quinidine This list may not describe all possible interactions. Give your health care provider a list of all the medicines, herbs, non-prescription drugs, or dietary supplements you use. Also tell them if you smoke, drink alcohol, or use illegal drugs. Some items may interact with your medicine. What should I watch for while using this medication? Visit your care team for regular checks on your progress. Tell your care team if your symptoms do not start to get better or if they get worse. To help reduce irritation at the injection site, use a different site for each injection and make sure the solution is at room temperature before use. This medication may cause decreases in blood sugar. Signs of low blood sugar include chills, cool, pale skin or cold sweats, drowsiness, extreme hunger, fast heartbeat, headache, nausea, nervousness or anxiety, shakiness, trembling, unsteadiness, tiredness, or weakness. Contact your care team right away if you experience any of these symptoms. This medication may increase blood sugar. The risk may be higher in patients who already have diabetes. Ask your care team what you can  do to lower your risk of diabetes while taking this medication. You should make sure you get enough vitamin B12 while you are taking this medication. Discuss the foods you eat and the vitamins you take with your care team. What side effects may I notice from receiving this medication? Side effects that  you should report to your care team as soon as possible: Allergic reactions--skin rash, itching, hives, swelling of the face, lips, tongue, or throat Gallbladder problems--severe stomach pain, nausea, vomiting, fever Heart rhythm changes--fast or irregular heartbeat, dizziness, feeling faint or lightheaded, chest pain, trouble breathing High blood sugar (hyperglycemia)--increased thirst or amount of urine, unusual weakness or fatigue, blurry vision Low blood sugar (hypoglycemia)--tremors or shaking, anxiety, sweating, cold or clammy skin, confusion, dizziness, rapid heartbeat Low thyroid levels (hypothyroidism)--unusual weakness or fatigue, increased sensitivity to cold, constipation, hair loss, dry skin, weight gain, feelings of depression Low vitamin B12 level--pain, tingling, or numbness in the hands or feet, muscle weakness, dizziness, confusion, trouble concentrating Oily or light-colored stools, diarrhea, bloating, weight loss Pancreatitis--severe stomach pain that spreads to your back or gets worse after eating or when touched, fever, nausea, vomiting Slow heartbeat--dizziness, feeling faint or lightheaded, confusion, trouble breathing, unusual weakness or fatigue Side effects that usually do not require medical attention (report these to your care team if they continue or are bothersome): Diarrhea Dizziness Headache Nausea Pain, redness, or irritation at injection site Stomach pain This list may not describe all possible side effects. Call your doctor for medical advice about side effects. You may report side effects to FDA at 1-800-FDA-1088. Where should I keep my medication? Keep out of the reach of children and pets. Store in the refrigerator. Protect from light. Allow to come to room temperature naturally. Do not use artificial heat. If protected from light, the injection may be stored between 20 and 30 degrees C (70 and 86 degrees F) for 14 days. After the initial use, throw away  any unused portion of a multiple dose vial after 14 days. Get rid of any unused portions of the ampules after use. To get rid of medications that are no longer needed or have expired: Take the medication to a medication take-back program. Ask your pharmacy or law enforcement to find a location. If you cannot return the medication, ask your pharmacist or care team how to get rid of the medication safely. NOTE: This sheet is a summary. It may not cover all possible information. If you have questions about this medicine, talk to your doctor, pharmacist, or health care provider.  2024 [employer]/Gold Standard (2023-03-27 00:00:00)

## 2024-04-07 ENCOUNTER — Inpatient Hospital Stay: Attending: Hematology and Oncology | Admitting: Oncology

## 2024-04-07 ENCOUNTER — Other Ambulatory Visit: Payer: Self-pay | Admitting: Oncology

## 2024-04-07 ENCOUNTER — Encounter: Payer: Self-pay | Admitting: Oncology

## 2024-04-07 ENCOUNTER — Telehealth: Payer: Self-pay | Admitting: Oncology

## 2024-04-07 ENCOUNTER — Inpatient Hospital Stay

## 2024-04-07 VITALS — BP 145/83 | HR 64 | Temp 98.0°F | Resp 18 | Ht 64.0 in | Wt 184.9 lb

## 2024-04-07 DIAGNOSIS — C7A012 Malignant carcinoid tumor of the ileum: Secondary | ICD-10-CM | POA: Insufficient documentation

## 2024-04-07 DIAGNOSIS — I2699 Other pulmonary embolism without acute cor pulmonale: Secondary | ICD-10-CM | POA: Diagnosis not present

## 2024-04-07 DIAGNOSIS — D121 Benign neoplasm of appendix: Secondary | ICD-10-CM | POA: Insufficient documentation

## 2024-04-07 DIAGNOSIS — D3A00 Benign carcinoid tumor of unspecified site: Secondary | ICD-10-CM | POA: Diagnosis not present

## 2024-04-07 DIAGNOSIS — M1990 Unspecified osteoarthritis, unspecified site: Secondary | ICD-10-CM | POA: Insufficient documentation

## 2024-04-07 DIAGNOSIS — Z79899 Other long term (current) drug therapy: Secondary | ICD-10-CM | POA: Insufficient documentation

## 2024-04-07 DIAGNOSIS — Z87891 Personal history of nicotine dependence: Secondary | ICD-10-CM | POA: Insufficient documentation

## 2024-04-07 DIAGNOSIS — E876 Hypokalemia: Secondary | ICD-10-CM | POA: Insufficient documentation

## 2024-04-07 DIAGNOSIS — J9601 Acute respiratory failure with hypoxia: Secondary | ICD-10-CM | POA: Diagnosis not present

## 2024-04-07 DIAGNOSIS — G629 Polyneuropathy, unspecified: Secondary | ICD-10-CM | POA: Diagnosis not present

## 2024-04-07 LAB — CBC WITH DIFFERENTIAL (CANCER CENTER ONLY)
Abs Immature Granulocytes: 0.02 K/uL (ref 0.00–0.07)
Basophils Absolute: 0 K/uL (ref 0.0–0.1)
Basophils Relative: 0 %
Eosinophils Absolute: 0.1 K/uL (ref 0.0–0.5)
Eosinophils Relative: 1 %
HCT: 39.5 % (ref 36.0–46.0)
Hemoglobin: 12.5 g/dL (ref 12.0–15.0)
Immature Granulocytes: 0 %
Lymphocytes Relative: 44 %
Lymphs Abs: 4.1 K/uL — ABNORMAL HIGH (ref 0.7–4.0)
MCH: 30 pg (ref 26.0–34.0)
MCHC: 31.6 g/dL (ref 30.0–36.0)
MCV: 94.7 fL (ref 80.0–100.0)
Monocytes Absolute: 0.7 K/uL (ref 0.1–1.0)
Monocytes Relative: 7 %
Neutro Abs: 4.4 K/uL (ref 1.7–7.7)
Neutrophils Relative %: 48 %
Platelet Count: 320 K/uL (ref 150–400)
RBC: 4.17 MIL/uL (ref 3.87–5.11)
RDW: 13.2 % (ref 11.5–15.5)
WBC Count: 9.2 K/uL (ref 4.0–10.5)
nRBC: 0 % (ref 0.0–0.2)

## 2024-04-07 LAB — CMP (CANCER CENTER ONLY)
ALT: 6 U/L (ref 0–44)
AST: 17 U/L (ref 15–41)
Albumin: 4.1 g/dL (ref 3.5–5.0)
Alkaline Phosphatase: 124 U/L (ref 38–126)
Anion gap: 8 (ref 5–15)
BUN: 10 mg/dL (ref 8–23)
CO2: 30 mmol/L (ref 22–32)
Calcium: 9.5 mg/dL (ref 8.9–10.3)
Chloride: 102 mmol/L (ref 98–111)
Creatinine: 0.66 mg/dL (ref 0.44–1.00)
GFR, Estimated: >60 mL/min (ref >60)
Glucose, Bld: 93 mg/dL (ref 70–99)
Potassium: 3.2 mmol/L — ABNORMAL LOW (ref 3.5–5.1)
Sodium: 141 mmol/L (ref 135–145)
Total Bilirubin: 0.3 mg/dL (ref 0.0–1.2)
Total Protein: 7.4 g/dL (ref 6.5–8.1)

## 2024-04-07 MED ORDER — POTASSIUM CHLORIDE CRYS ER 20 MEQ PO TBCR: 40.0000 meq | EXTENDED_RELEASE_TABLET | Freq: Two times a day (BID) | ORAL | 5 refills | Status: AC

## 2024-04-07 MED ORDER — OCTREOTIDE ACETATE 20 MG IM KIT
20.0000 mg | PACK | Freq: Once | INTRAMUSCULAR | Status: AC
  Administered 2024-04-07: 20 mg via INTRAMUSCULAR
  Filled 2024-04-07: qty 1

## 2024-04-07 NOTE — Progress Notes (Signed)
 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 Seven of eighteen lymph nodes were involved (7/18).  Primary tumor measured 2.2 x 1.7 x 1.5 cm, with three apparent vascular metastases including a nodule up to 3 cm with metastatic tumor present within 1 mm of the inked radial margin.  In the distal appendix there was also a separate 3 mm carcinoid tumor.  This was a staged as a pT3pN1 with low mitotic rate (0 per 10 high powered fields).  [name] was sporadic.  We began seeing her in February 2022 for carcinoid syndrome symptoms.  Her chromogranin A was elevated at 262.  CT chest, abdomen and pelvis did not reveal any evidence of recurrent or metastatic disease.  She was started on octreotide  every 4 weeks.  She had normalization of her chromogranin A and this has remained normal.  Routine CT imaging has remained without evidence of recurrent or metastatic disease, most recently in May 2024.  She is not always compliant with her octreotide  injections.  She missed her injection in February as she was in the ED at [facility] for chest pain.  Evaluation did not reveal cardiac abnormality.  She will continue octreotide  injections every 4 weeks. Her CT chest, abdomen, pelvis completed on 10/29/2023 revealed status post subtotal right hemicolectomy with ileocolic anastomosis with no evidence of local recurrence or metastatic disease in the chest, abdomen or pelvis, a stable 2.3 cm left adrenal nodule unchanged, an enlarged main pulmonary artery, and aortic atherosclerosis.   Neuropathy She saw Dr. Jama, neurology, [facility] in October 2023.  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     Pulmonary embolism with acute respiratory failure with hypoxia  She was found to have right lower lobe subsegmental nonocclusive pulmonary embolism incidentally in May, for which she was placed on Eliquis  twice daily.  Dr. Mardee recommended continuing Eliquis  for at least 6 months. We had thought this was discontinued, but she continues on Eliquis  5 mg daily per her PCP.   Hypokalemia Her potassium is down to 3.2 despite supposedly taking 10 mEq BID. I instructed her to increase this to 40 mEq BID, but I question her compliance.  DJD/DDD She has extensive osteoarthritis and degenerative disc disease with significant radiculopathy, but there is not much that can be done. I have explained this to her once again as she is very frustrated that she cannot ambulate.  Plan: She continues about half a tablet of oxycodone 10-325 mg BID for pain management. She has a WBC of 9.2, hemoglobin of 12.5, and platelet count of 320,000. Her CMP is normal other than a low potassium of 3.2. I instructed her to take two potassium tablets BID. Her chromogranin A is pending today. I advised her to continue her Sandostatin  injections every 4 weeks. I will see her back in 12 weeks with CBC, CMP, and chromogranin A. The patient understands the plans discussed today and is in agreement with them.  She knows to contact our office if she develops concerns prior to her next appointment.  I provided 20 minutes of  face-to-face time during this encounter and > 50% was spent counseling as documented under my assessment and plan.   Kristi VEAR Cornish, MD   [facility] OF [facility] [address] [place] [address] Dept: [phone] Dept Fax: [phone]   No orders of the defined types were placed in this  encounter.   CHIEF COMPLAINT:  CC: Malignant carcinoid  Current Treatment: Octreotide  every 4 weeks  HISTORY OF PRESENT ILLNESS:   Oncology History  Malignant carcinoid tumor of ileum (HCC)  04/28/2014 Initial Diagnosis   Malignant carcinoid tumor of ileum (HCC)   02/02/2015 Cancer Staging   Staging form: Neuroendocrine Tumor - Duodenum/Ampulla/Jejunum/Illeum, AJCC 7th Edition - Clinical stage from 02/02/2015: Stage IIIB (T3(3), N1, M0) - Signed by Romero Kristi VEAR, MD on 05/28/2020 Staged by: Managing physician Diagnostic confirmation: Positive histology Specimen type: Excision Histopathologic type: Carcinoid tumor, NOS Stage prefix: Initial diagnosis Tumor size (mm): 22 Multiple tumors: Yes Number of tumors: 3 Histologic grade (G): G1 Lymph-vascular invasion (LVI): LVI not present (absent)/not identified Residual tumor (R): R0 - None Stage used in treatment planning: Yes National guidelines used in treatment planning: Yes Type of national guideline used in treatment planning: NCCN Staging comments: Surgical resection     INTERVAL HISTORY:  Kristi Romero is here today for repeat clinical assessment for her malignant carcinoid of the ileum. Patient states that she feels ok but complains of flushing about 3x daily, upper abdominal pain, and mild nausea and vomiting. She continues about half a tablet of oxycodone 10-325 mg BID for pain management. She has a WBC of 9.2, hemoglobin of 12.5, and platelet count of 320,000. Her CMP is normal other than a low potassium of 3.2. I instructed her to take two potassium tablets BID. Her chromogranin A is pending today. I advised her to continue her Sandostatin  injections every 4 weeks. I will see her back in 12 weeks with CBC, CMP, and chromogranin A. She denies fever, chills, or other signs of infection. She denies cardiorespiratory and gastrointestinal issues. Her appetite is poor and Her weight has decreased 1 pounds over last 3 months. This  patient is accompanied in the office by her friend.   REVIEW OF SYSTEMS:  Review of Systems  Constitutional:  Positive for appetite change (decreased). Negative for chills, fatigue, fever and unexpected weight change.  HENT:  Negative.  Negative for lump/mass, mouth sores and sore throat.   Eyes: Negative.   Respiratory: Negative.  Negative for chest tightness, cough, hemoptysis, shortness of breath and wheezing.   Cardiovascular: Negative.  Negative for chest pain, leg swelling and palpitations.  Gastrointestinal:  Positive for abdominal pain (upper), nausea (mild) and vomiting (mild). Negative for abdominal distention, blood in stool, constipation and diarrhea.  Endocrine: Negative.  Negative for hot flashes.       Flushing  Genitourinary: Negative.  Negative for difficulty urinating, dysuria, frequency and hematuria.   Musculoskeletal:  Positive for arthralgias, back pain (8/10) and gait problem (in a wheelchair due to neuropathy). Negative for flank pain, myalgias and neck pain.  Skin: Negative.  Negative for rash.  Neurological:  Positive for gait problem (in a wheelchair due to neuropathy) and numbness (hands and legs; right lower extremity numbness and burning from neuropathy). Negative for dizziness, extremity weakness, headaches, light-headedness, seizures and speech difficulty.  Hematological: Negative.  Negative for adenopathy. Does not bruise/bleed easily.  Psychiatric/Behavioral: Negative.  Negative for depression and sleep disturbance. The patient is  not nervous/anxious.     VITALS:  Blood pressure (!) 145/83, pulse 64, temperature 98 F (36.7 C), temperature source Oral, resp. rate 18, height 5' 4 (1.626 m), weight 184 lb 14.4 oz (83.9 kg), SpO2 99%.  Wt Readings from Last 3 Encounters:  04/07/24 184 lb 14.4 oz (83.9 kg)  01/14/24 183 lb 8 oz (83.2 kg)  10/22/23 183 lb 4.8 oz (83.1 kg)    Body mass index is 31.74 kg/m.  Performance status (ECOG): 2 - Symptomatic, <50%  confined to bed  PHYSICAL EXAM:  Physical Exam Vitals and nursing note reviewed.  Constitutional:      General: She is not in acute distress.    Appearance: Normal appearance.  HENT:     Head: Normocephalic and atraumatic.     Mouth/Throat:     Mouth: Mucous membranes are moist.     Pharynx: Oropharynx is clear. No oropharyngeal exudate or posterior oropharyngeal erythema.  Eyes:     General: No scleral icterus.    Extraocular Movements: Extraocular movements intact.     Conjunctiva/sclera: Conjunctivae normal.     Pupils: Pupils are equal, round, and reactive to light.  Cardiovascular:     Rate and Rhythm: Normal rate and regular rhythm.     Heart sounds: Normal heart sounds. No murmur heard.    No friction rub. No gallop.  Pulmonary:     Effort: Pulmonary effort is normal.     Breath sounds: Normal breath sounds. No wheezing, rhonchi or rales.  Abdominal:     General: There is no distension.     Palpations: Abdomen is soft. There is no hepatomegaly, splenomegaly or mass.     Tenderness: There is no abdominal tenderness.  Musculoskeletal:        General: Normal range of motion.     Cervical back: Normal range of motion and neck supple. No tenderness.     Right lower leg: No edema.     Left lower leg: No edema.  Lymphadenopathy:     Cervical: No cervical adenopathy.     Upper Body:     Right upper body: No supraclavicular or axillary adenopathy.     Left upper body: No supraclavicular or axillary adenopathy.     Lower Body: No right inguinal adenopathy. No left inguinal adenopathy.  Skin:    General: Skin is warm and dry.     Coloration: Skin is not jaundiced.     Findings: No rash.  Neurological:     Mental Status: She is alert and oriented to person, place, and time.     Cranial Nerves: No cranial nerve deficit.  Psychiatric:        Mood and Affect: Mood normal.        Behavior: Behavior normal.        Thought Content: Thought content normal.    LABS:       Latest Ref Rng & Units 04/07/2024    1:27 PM 01/14/2024    1:26 PM 10/22/2023    1:50 PM  CBC  WBC 4.0 - 10.5 K/uL 9.2  10.4  11.7   Hemoglobin 12.0 - 15.0 g/dL 87.4  87.2  86.5   Hematocrit 36.0 - 46.0 % 39.5  39.3  41.7   Platelets 150 - 400 K/uL 320  307  239       Latest Ref Rng & Units 04/07/2024    1:27 PM 01/14/2024    1:26 PM 10/22/2023    1:50 PM  CMP  Glucose 70 - 99 mg/dL 93  95  78   BUN 8 - 23 mg/dL 10  8  6    Creatinine 0.44 - 1.00 mg/dL 9.33  9.24  9.20   Sodium 135 - 145 mmol/L 141  139  140   Potassium 3.5 - 5.1 mmol/L 3.2  3.2  3.9   Chloride 98 - 111 mmol/L 102  101  103   CO2 22 - 32 mmol/L 30  27  26    Calcium 8.9 - 10.3 mg/dL 9.5  9.5  9.7   Total Protein 6.5 - 8.1 g/dL 7.4  8.0  7.8   Total Bilirubin 0.0 - 1.2 mg/dL 0.3  0.3  0.4   Alkaline Phos 38 - 126 U/L 124  124  140   AST 15 - 41 U/L 17  16  13    ALT 0 - 44 U/L 6  5  <5     Lab Results  Component Value Date   TOTALPROTELP 6.6 06/01/2020   ALBUMINELP 3.1 06/01/2020   A1GS 0.3 06/01/2020   A2GS 0.8 06/01/2020   BETS 1.1 06/01/2020   GAMS 1.4 06/01/2020   MSPIKE Not Observed 06/01/2020   SPEI Comment 06/01/2020   Lab Results  Component Value Date   TIBC 244 (L) 09/06/2021   FERRITIN 873 (H) 09/06/2021   IRONPCTSAT 9 (L) 09/06/2021    STUDIES:  EXAM: 11/23/2023 MAM DIGITAL W/TOMO DIAG L IMPRESSION: Overall final assessnent: BIRADS 1 NEGATIVE.  EXAM: 10/29/2023 CT CHEST, ABDOMEN, AND PELVIS WITH CONTRAST IMPRESSION: 1. Status post subtotal right hemicolectomy with ileocolic anastomosis. No evidence of local recurrence or metastatic disease in the chest, abdomen or pelvis. 2. Stable 2.3 cm left adrenal nodule unchanged over multiple prior examinations and favored a lipid poor adenoma. 3. Enlarged main pulmonary artery, which can be seen in the setting of pulmonary arterial hypertension. 4.  Aortic Atherosclerosis (ICD10-I70.0).  HISTORY:   Past Medical History:  Diagnosis Date    Abdominal pain, epigastric    Accident occurring in home 05/07/2016   Acute respiratory failure with hypoxia (HCC) 09/06/2021   Adrenal adenoma 03/16/2018   Anxiety and depression 03/16/2018   BMI 26.0-26.9,adult    Cervicogenic headache 10/14/2016   Chest pain, unspecified    Chronic abdominal pain and nausea  09/06/2021   Chronic back pain    Constipation 04/08/2021   Dizziness 10/14/2016   Elevated lipase 01/13/2017   Formatting of this note might be different from the original. 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   Normocytic anemia 09/06/2021   Numbness    Numbness and tingling 10/14/2016   Pulmonary embolism with acute respiratory failure with hypoxia  09/06/2021   Restless leg syndrome    Sleeping difficulty 04/18/2016   Spondylosis, cervical, 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 Dr Mckinley [place]    COLONOSCOPY  09/05/2020   Dr Anette Benign neoplasm of transverse colon. Benign neoplasm of descending colon.   ESOPHAGOGASTRODUODENOSCOPY  02/2018    ESOPHAGOGASTRODUODENOSCOPY (EGD) WITH PROPOFOL  N/A 03/16/2018   Procedure: ESOPHAGOGASTRODUODENOSCOPY (EGD) WITH PROPOFOL ;  Surgeon: Aneita Gwendlyn DASEN, MD;  Location: [facility] ENDOSCOPY;  Service: Endoscopy;  Laterality: N/A;   NECK SURGERY     PARTIAL HYSTERECTOMY     TOE SURGERY Bilateral     Family History  Problem Relation Age of Onset   Lung cancer Father    Heart disease Father    Hypertension Father    Heart disease Mother    Hypertension Mother    Cancer Mother     Social History:  reports that she has quit smoking. She has never used smokeless tobacco. She reports current drug use. Drug: Marijuana. She reports that she does not drink alcohol.The patient is accompanied by her home health aide today.  Allergies:  Allergies  Allergen Reactions   Atorvastatin Anaphylaxis   Codeine Nausea And Vomiting, Nausea Only and Shortness Of Breath    Other reaction(s): GI Upset (intolerance)  Other Reaction(s): upset stomach  Other reaction(s): GI Upset (intolerance)  Other Reaction(s): upset stomach   Lisinopril Anaphylaxis, Swelling and Other (See Comments)    Other Reaction(s): tongue swells up    Current Medications: Current Outpatient Medications  Medication Sig Dispense Refill   oxyCODONE-acetaminophen  (PERCOCET) 10-325 MG tablet Take 1 tablet by mouth 3 (three) times daily as needed.     acetaminophen  (TYLENOL ) 500 MG tablet Take 500 mg by mouth every 6 (six) hours as needed for mild pain.     apixaban  (ELIQUIS ) 5 MG TABS tablet Take 1 tablet (5 mg total) by mouth 2 (two) times daily. (Patient taking differently: Take 5 mg by mouth 2 (two) times daily. Patient voiced taking once daily) 60 tablet 0   atorvastatin (LIPITOR) 40 MG tablet Take 40 mg by mouth daily.     baclofen (LIORESAL) 10 MG tablet Take 10 mg by mouth 2 (two) times daily.     diclofenac Sodium (VOLTAREN) 1 % GEL Apply topically.     dicyclomine  (BENTYL ) 20 MG tablet Take 20 mg by mouth 4 (four) times daily as  needed for spasms.     DULoxetine  (CYMBALTA ) 60 MG capsule Take 60 mg by mouth daily.     Fluticasone-Umeclidin-Vilant (TRELEGY ELLIPTA) 100-62.5-25 MCG/ACT AEPB 1 puff Inhalation Once a day for 90 days     isosorbide mononitrate (IMDUR) 30 MG 24 hr tablet 1 tablet in the morning Orally Once a day     LINZESS  290 MCG CAPS capsule Take 290 mcg by mouth daily. Patient reports taking as needed     meclizine  (ANTIVERT ) 25 MG tablet Take 25 mg by mouth 4 (four) times daily as needed for dizziness.     metoprolol  succinate (TOPROL -XL) 25 MG 24 hr tablet 1 tablet Orally Once a day     mirtazapine  (REMERON ) 15 MG tablet TAKE 1 TABLET AT BEDTIME 90 tablet 3   nitroGLYCERIN (NITROSTAT) 0.4 MG SL tablet 1 tablet under the tongue and allow to dissolve as needed. Take every 5 minutes up to 3 times if chest pain persists Sublingual Three times a day  omeprazole  (PRILOSEC) 40 MG capsule Take 1 capsule (40 mg total) by mouth daily. 30 capsule 1   ondansetron  (ZOFRAN ) 4 MG tablet Take 1 tablet (4 mg total) by mouth every 6 (six) hours. 12 tablet 0   ondansetron  (ZOFRAN -ODT) 4 MG disintegrating tablet Take 4 mg by mouth 2 (two) times daily as needed for nausea or vomiting.     potassium chloride  SA (KLOR-CON  M) 20 MEQ tablet Take 2 tablets (40 mEq total) by mouth 2 (two) times daily. 120 tablet 5   pregabalin (LYRICA) 50 MG capsule Take by mouth.     No current facility-administered medications for this visit.   I,Baylynn Shifflett H Gilbert Manolis,acting as a scribe for Kristi VEAR Cornish, MD.,have documented all relevant documentation on the behalf of Kristi VEAR Cornish, MD,as directed by  Kristi VEAR Cornish, MD while in the presence of Kristi VEAR Cornish, MD.

## 2024-04-07 NOTE — Telephone Encounter (Signed)
 Patient has been scheduled for follow-up visit per 04/07/2024 LOS.  Pt given an appt calendar with date and time.

## 2024-04-08 LAB — CHROMOGRANIN A: Chromogranin A (ng/mL): 38.3 ng/mL (ref 0.0–101.8)

## 2024-05-05 ENCOUNTER — Telehealth: Payer: Self-pay | Admitting: Oncology

## 2024-05-05 ENCOUNTER — Inpatient Hospital Stay: Attending: Hematology and Oncology

## 2024-05-05 DIAGNOSIS — Z79899 Other long term (current) drug therapy: Secondary | ICD-10-CM | POA: Insufficient documentation

## 2024-05-05 DIAGNOSIS — C7A012 Malignant carcinoid tumor of the ileum: Secondary | ICD-10-CM | POA: Insufficient documentation

## 2024-05-05 NOTE — Telephone Encounter (Signed)
 Contacted pt to reschedule Sandostatin  appt. Unable to reach via phone.

## 2024-05-06 NOTE — Telephone Encounter (Signed)
Contacted pt to schedule an appt. Unable to reach via phone. ? ?

## 2024-05-11 ENCOUNTER — Inpatient Hospital Stay

## 2024-05-11 VITALS — BP 138/87 | HR 74 | Temp 98.2°F | Resp 18 | Ht 64.0 in

## 2024-05-11 DIAGNOSIS — C7A012 Malignant carcinoid tumor of the ileum: Secondary | ICD-10-CM | POA: Diagnosis present

## 2024-05-11 DIAGNOSIS — Z79899 Other long term (current) drug therapy: Secondary | ICD-10-CM | POA: Diagnosis not present

## 2024-05-11 MED ORDER — OCTREOTIDE ACETATE 20 MG IM KIT
20.0000 mg | PACK | Freq: Once | INTRAMUSCULAR | Status: AC
  Administered 2024-05-11: 20 mg via INTRAMUSCULAR
  Filled 2024-05-11: qty 1

## 2024-05-11 NOTE — Patient Instructions (Signed)
 Octreotide Injection Suspension What is this medication? OCTREOTIDE (ok TREE oh tide) treats high levels of growth hormone (acromegaly). It works by reducing the amount of growth hormone your body makes. This reduces symptoms and the risk of health problems caused by too much growth hormone, such as diabetes and heart disease. It may also be used to treat diarrhea caused by neuroendocrine tumors. It works by slowing down the release of serotonin from the tumor cells. This reduces the number of bowel movements you have. This medicine may be used for other purposes; ask your health care provider or pharmacist if you have questions. COMMON BRAND NAME(S): Sandostatin LAR What should I tell my care team before I take this medication? They need to know if you have any of these conditions: Diabetes Gallbladder disease Heart disease Kidney disease Liver disease Pancreatic disease Thyroid disease An unusual or allergic reaction to octreotide, other medications, foods, dyes, or preservatives Pregnant or trying to get pregnant Breastfeeding How should I use this medication? This medication is injected into a muscle. It is usually given by your care team in a hospital or clinic setting. Talk to your care team about the use of this medication in children. Special care may be needed. Overdosage: If you think you have taken too much of this medicine contact a poison control center or emergency room at once. NOTE: This medicine is only for you. Do not share this medicine with others. What if I miss a dose? Keep appointments for follow-up doses. It is important not to miss your dose. Call your care team if you are unable to keep an appointment. What may interact with this medication? Do not take this medication with any of the following: Cisapride Dronedarone Flibanserin Lutetium Lu 177 dotatate Pimozide Saquinavir Thioridazine This medication may also interact with the  following: Bromocriptine Certain medications for blood pressure, heart disease, irregular heartbeat Cyclosporine Diuretics Medications for diabetes, including insulin Quinidine This list may not describe all possible interactions. Give your health care provider a list of all the medicines, herbs, non-prescription drugs, or dietary supplements you use. Also tell them if you smoke, drink alcohol, or use illegal drugs. Some items may interact with your medicine. What should I watch for while using this medication? Visit your care team for regular checks on your progress. Tell your care team if your symptoms do not start to get better or if they get worse. This medication may cause decreases in blood sugar. Signs of low blood sugar include chills, cool, pale skin or cold sweats, drowsiness, extreme hunger, fast heartbeat, headache, nausea, nervousness or anxiety, shakiness, trembling, unsteadiness, tiredness, or weakness. Contact your care team right away if you experience any of these symptoms. This medication may increase blood sugar. The risk may be higher in patients who already have diabetes. Ask your care team what you can do to lower your risk of diabetes while taking this medication. You should make sure you get enough vitamin B12 while you are taking this medication. Discuss the foods you eat and the vitamins you take with your care team. What side effects may I notice from receiving this medication? Side effects that you should report to your care team as soon as possible: Allergic reactions--skin rash, itching, hives, swelling of the face, lips, tongue, or throat Gallbladder problems--severe stomach pain, nausea, vomiting, fever Heart rhythm changes--fast or irregular heartbeat, dizziness, feeling faint or lightheaded, chest pain, trouble breathing High blood sugar (hyperglycemia)--increased thirst or amount of urine, unusual weakness or fatigue,  blurry vision Low blood sugar  (hypoglycemia)--tremors or shaking, anxiety, sweating, cold or clammy skin, confusion, dizziness, rapid heartbeat Low thyroid levels (hypothyroidism)--unusual weakness or fatigue, increased sensitivity to cold, constipation, hair loss, dry skin, weight gain, feelings of depression Low vitamin B12 level--pain, tingling, or numbness in the hands or feet, muscle weakness, dizziness, confusion, trouble concentrating Oily or light-colored stools, diarrhea, bloating, weight loss Pancreatitis--severe stomach pain that spreads to your back or gets worse after eating or when touched, fever, nausea, vomiting Slow heartbeat--dizziness, feeling faint or lightheaded, confusion, trouble breathing, unusual weakness or fatigue Side effects that usually do not require medical attention (report these to your care team if they continue or are bothersome): Diarrhea Dizziness Headache Nausea Pain, redness, or irritation at injection site Stomach pain This list may not describe all possible side effects. Call your doctor for medical advice about side effects. You may report side effects to FDA at 1-800-FDA-1088. Where should I keep my medication? This medication is given in a hospital or clinic. It will not be stored at home. NOTE: This sheet is a summary. It may not cover all possible information. If you have questions about this medicine, talk to your doctor, pharmacist, or health care provider.  2024 Elsevier/Gold Standard (2023-03-27 00:00:00)

## 2024-06-02 ENCOUNTER — Inpatient Hospital Stay

## 2024-06-08 ENCOUNTER — Inpatient Hospital Stay: Attending: Hematology and Oncology

## 2024-06-30 ENCOUNTER — Inpatient Hospital Stay

## 2024-06-30 ENCOUNTER — Inpatient Hospital Stay: Admitting: Oncology

## 2024-07-07 ENCOUNTER — Inpatient Hospital Stay: Admitting: Oncology

## 2024-07-07 ENCOUNTER — Inpatient Hospital Stay
